# Patient Record
Sex: Female | Born: 1945
Health system: Southern US, Community
[De-identification: ages and names within clinical notes are randomized; demographics above are authoritative.]

## PROBLEM LIST (undated history)

## (undated) DIAGNOSIS — T8859XA Other complications of anesthesia, initial encounter: Secondary | ICD-10-CM

## (undated) DIAGNOSIS — M25551 Pain in right hip: Principal | ICD-10-CM

## (undated) DIAGNOSIS — T7840XA Allergy, unspecified, initial encounter: Secondary | ICD-10-CM

## (undated) DIAGNOSIS — M810 Age-related osteoporosis without current pathological fracture: Secondary | ICD-10-CM

## (undated) DIAGNOSIS — E785 Hyperlipidemia, unspecified: Secondary | ICD-10-CM

## (undated) DIAGNOSIS — R519 Headache, unspecified: Secondary | ICD-10-CM

## (undated) DIAGNOSIS — Z9889 Other specified postprocedural states: Secondary | ICD-10-CM

## (undated) DIAGNOSIS — T4145XA Adverse effect of unspecified anesthetic, initial encounter: Secondary | ICD-10-CM

## (undated) DIAGNOSIS — R109 Unspecified abdominal pain: Secondary | ICD-10-CM

## (undated) DIAGNOSIS — Z5189 Encounter for other specified aftercare: Secondary | ICD-10-CM

## (undated) DIAGNOSIS — N390 Urinary tract infection, site not specified: Secondary | ICD-10-CM

## (undated) DIAGNOSIS — R112 Nausea with vomiting, unspecified: Secondary | ICD-10-CM

## (undated) DIAGNOSIS — Z8619 Personal history of other infectious and parasitic diseases: Secondary | ICD-10-CM

## (undated) DIAGNOSIS — F329 Major depressive disorder, single episode, unspecified: Secondary | ICD-10-CM

## (undated) DIAGNOSIS — E039 Hypothyroidism, unspecified: Secondary | ICD-10-CM

## (undated) DIAGNOSIS — F32A Depression, unspecified: Secondary | ICD-10-CM

## (undated) DIAGNOSIS — M419 Scoliosis, unspecified: Secondary | ICD-10-CM

## (undated) DIAGNOSIS — E079 Disorder of thyroid, unspecified: Secondary | ICD-10-CM

## (undated) DIAGNOSIS — Z0000 Encounter for general adult medical examination without abnormal findings: Secondary | ICD-10-CM

## (undated) DIAGNOSIS — E876 Hypokalemia: Secondary | ICD-10-CM

## (undated) DIAGNOSIS — F418 Other specified anxiety disorders: Secondary | ICD-10-CM

## (undated) DIAGNOSIS — J069 Acute upper respiratory infection, unspecified: Secondary | ICD-10-CM

## (undated) DIAGNOSIS — D649 Anemia, unspecified: Secondary | ICD-10-CM

## (undated) DIAGNOSIS — G629 Polyneuropathy, unspecified: Secondary | ICD-10-CM

## (undated) DIAGNOSIS — S73004A Unspecified dislocation of right hip, initial encounter: Secondary | ICD-10-CM

## (undated) DIAGNOSIS — R51 Headache: Secondary | ICD-10-CM

## (undated) DIAGNOSIS — I8390 Asymptomatic varicose veins of unspecified lower extremity: Secondary | ICD-10-CM

## (undated) DIAGNOSIS — M48061 Spinal stenosis, lumbar region without neurogenic claudication: Secondary | ICD-10-CM

## (undated) DIAGNOSIS — M1990 Unspecified osteoarthritis, unspecified site: Secondary | ICD-10-CM

## (undated) DIAGNOSIS — H9191 Unspecified hearing loss, right ear: Secondary | ICD-10-CM

## (undated) HISTORY — DX: Encounter for general adult medical examination without abnormal findings: Z00.00

## (undated) HISTORY — DX: Headache: R51

## (undated) HISTORY — PX: JOINT REPLACEMENT: SHX530

## (undated) HISTORY — DX: Urinary tract infection, site not specified: N39.0

## (undated) HISTORY — DX: Personal history of other infectious and parasitic diseases: Z86.19

## (undated) HISTORY — DX: Allergy, unspecified, initial encounter: T78.40XA

## (undated) HISTORY — DX: Polyneuropathy, unspecified: G62.9

## (undated) HISTORY — DX: Acute upper respiratory infection, unspecified: J06.9

## (undated) HISTORY — DX: Depression, unspecified: F32.A

## (undated) HISTORY — PX: EYE SURGERY: SHX253

## (undated) HISTORY — DX: Unspecified abdominal pain: R10.9

## (undated) HISTORY — DX: Unspecified osteoarthritis, unspecified site: M19.90

## (undated) HISTORY — DX: Anemia, unspecified: D64.9

## (undated) HISTORY — DX: Scoliosis, unspecified: M41.9

## (undated) HISTORY — DX: Age-related osteoporosis without current pathological fracture: M81.0

## (undated) HISTORY — PX: BREAST EXCISIONAL BIOPSY: SUR124

## (undated) HISTORY — DX: Pain in right hip: M25.551

## (undated) HISTORY — DX: Major depressive disorder, single episode, unspecified: F32.9

## (undated) HISTORY — PX: BREAST SURGERY: SHX581

## (undated) HISTORY — DX: Spinal stenosis, lumbar region without neurogenic claudication: M48.061

## (undated) HISTORY — DX: Hyperlipidemia, unspecified: E78.5

## (undated) HISTORY — DX: Headache, unspecified: R51.9

## (undated) HISTORY — DX: Hypokalemia: E87.6

## (undated) HISTORY — PX: TONSILLECTOMY: SUR1361

## (undated) HISTORY — DX: Encounter for other specified aftercare: Z51.89

## (undated) HISTORY — DX: Other specified anxiety disorders: F41.8

## (undated) HISTORY — PX: REDUCTION MAMMAPLASTY: SUR839

## (undated) HISTORY — DX: Disorder of thyroid, unspecified: E07.9

## (undated) HISTORY — DX: Unspecified hearing loss, right ear: H91.91

---

## 1978-04-13 HISTORY — PX: TUBAL LIGATION: SHX77

## 1993-04-13 HISTORY — PX: METACARPOPHALANGEAL JOINT CAPSULECTOMY / CAPSULOTOMY: SUR186

## 1999-08-19 ENCOUNTER — Encounter: Payer: Self-pay | Admitting: Vascular Surgery

## 1999-08-21 ENCOUNTER — Ambulatory Visit (HOSPITAL_COMMUNITY): Admission: RE | Admit: 1999-08-21 | Discharge: 1999-08-21 | Payer: Self-pay | Admitting: Vascular Surgery

## 2000-01-15 ENCOUNTER — Other Ambulatory Visit: Admission: RE | Admit: 2000-01-15 | Discharge: 2000-01-15 | Payer: Self-pay | Admitting: Obstetrics and Gynecology

## 2000-02-24 ENCOUNTER — Encounter: Payer: Self-pay | Admitting: Obstetrics and Gynecology

## 2000-02-24 ENCOUNTER — Ambulatory Visit (HOSPITAL_COMMUNITY): Admission: RE | Admit: 2000-02-24 | Discharge: 2000-02-24 | Payer: Self-pay | Admitting: Obstetrics and Gynecology

## 2000-03-30 ENCOUNTER — Other Ambulatory Visit: Admission: RE | Admit: 2000-03-30 | Discharge: 2000-03-30 | Payer: Self-pay | Admitting: Obstetrics and Gynecology

## 2000-10-28 ENCOUNTER — Ambulatory Visit (HOSPITAL_COMMUNITY): Admission: RE | Admit: 2000-10-28 | Discharge: 2000-10-28 | Payer: Self-pay | Admitting: Gastroenterology

## 2001-05-17 ENCOUNTER — Encounter: Payer: Self-pay | Admitting: Internal Medicine

## 2001-05-17 ENCOUNTER — Ambulatory Visit (HOSPITAL_COMMUNITY): Admission: RE | Admit: 2001-05-17 | Discharge: 2001-05-17 | Payer: Self-pay | Admitting: Internal Medicine

## 2001-06-15 ENCOUNTER — Encounter: Payer: Self-pay | Admitting: Vascular Surgery

## 2001-06-16 ENCOUNTER — Ambulatory Visit (HOSPITAL_COMMUNITY): Admission: RE | Admit: 2001-06-16 | Discharge: 2001-06-16 | Payer: Self-pay | Admitting: Vascular Surgery

## 2009-04-13 HISTORY — PX: TOTAL HIP ARTHROPLASTY: SHX124

## 2009-06-12 LAB — HM COLONOSCOPY

## 2010-02-21 LAB — HM COLONOSCOPY

## 2011-06-02 DIAGNOSIS — R05 Cough: Secondary | ICD-10-CM | POA: Diagnosis not present

## 2011-06-02 DIAGNOSIS — J069 Acute upper respiratory infection, unspecified: Secondary | ICD-10-CM | POA: Diagnosis not present

## 2011-11-18 DIAGNOSIS — M545 Low back pain, unspecified: Secondary | ICD-10-CM | POA: Diagnosis not present

## 2011-11-18 DIAGNOSIS — R1011 Right upper quadrant pain: Secondary | ICD-10-CM | POA: Diagnosis not present

## 2011-11-26 DIAGNOSIS — R1011 Right upper quadrant pain: Secondary | ICD-10-CM | POA: Diagnosis not present

## 2011-12-17 DIAGNOSIS — G5600 Carpal tunnel syndrome, unspecified upper limb: Secondary | ICD-10-CM | POA: Diagnosis not present

## 2011-12-17 DIAGNOSIS — F4321 Adjustment disorder with depressed mood: Secondary | ICD-10-CM | POA: Diagnosis not present

## 2011-12-17 DIAGNOSIS — M545 Low back pain: Secondary | ICD-10-CM | POA: Diagnosis not present

## 2012-01-20 DIAGNOSIS — F329 Major depressive disorder, single episode, unspecified: Secondary | ICD-10-CM | POA: Diagnosis not present

## 2012-02-24 DIAGNOSIS — M545 Low back pain, unspecified: Secondary | ICD-10-CM | POA: Diagnosis not present

## 2012-02-24 DIAGNOSIS — E039 Hypothyroidism, unspecified: Secondary | ICD-10-CM | POA: Diagnosis not present

## 2012-02-24 DIAGNOSIS — Z131 Encounter for screening for diabetes mellitus: Secondary | ICD-10-CM | POA: Diagnosis not present

## 2012-03-01 DIAGNOSIS — M4120 Other idiopathic scoliosis, site unspecified: Secondary | ICD-10-CM | POA: Diagnosis not present

## 2012-03-01 DIAGNOSIS — IMO0002 Reserved for concepts with insufficient information to code with codable children: Secondary | ICD-10-CM | POA: Diagnosis not present

## 2012-03-01 DIAGNOSIS — M546 Pain in thoracic spine: Secondary | ICD-10-CM | POA: Diagnosis not present

## 2012-03-01 DIAGNOSIS — M5124 Other intervertebral disc displacement, thoracic region: Secondary | ICD-10-CM | POA: Diagnosis not present

## 2012-03-01 DIAGNOSIS — M48061 Spinal stenosis, lumbar region without neurogenic claudication: Secondary | ICD-10-CM | POA: Diagnosis not present

## 2012-03-01 DIAGNOSIS — M5137 Other intervertebral disc degeneration, lumbosacral region: Secondary | ICD-10-CM | POA: Diagnosis not present

## 2012-04-22 DIAGNOSIS — M5137 Other intervertebral disc degeneration, lumbosacral region: Secondary | ICD-10-CM | POA: Diagnosis not present

## 2012-04-22 DIAGNOSIS — M533 Sacrococcygeal disorders, not elsewhere classified: Secondary | ICD-10-CM | POA: Diagnosis not present

## 2012-05-19 DIAGNOSIS — M5137 Other intervertebral disc degeneration, lumbosacral region: Secondary | ICD-10-CM | POA: Diagnosis not present

## 2012-05-19 DIAGNOSIS — M533 Sacrococcygeal disorders, not elsewhere classified: Secondary | ICD-10-CM | POA: Diagnosis not present

## 2012-05-19 DIAGNOSIS — M1990 Unspecified osteoarthritis, unspecified site: Secondary | ICD-10-CM | POA: Diagnosis not present

## 2012-05-19 DIAGNOSIS — Z880 Allergy status to penicillin: Secondary | ICD-10-CM | POA: Diagnosis not present

## 2012-05-24 DIAGNOSIS — IMO0001 Reserved for inherently not codable concepts without codable children: Secondary | ICD-10-CM | POA: Diagnosis not present

## 2012-05-24 DIAGNOSIS — M533 Sacrococcygeal disorders, not elsewhere classified: Secondary | ICD-10-CM | POA: Diagnosis not present

## 2012-05-24 DIAGNOSIS — M5137 Other intervertebral disc degeneration, lumbosacral region: Secondary | ICD-10-CM | POA: Diagnosis not present

## 2012-05-31 DIAGNOSIS — M4800 Spinal stenosis, site unspecified: Secondary | ICD-10-CM | POA: Diagnosis not present

## 2012-05-31 DIAGNOSIS — R609 Edema, unspecified: Secondary | ICD-10-CM | POA: Diagnosis not present

## 2012-05-31 DIAGNOSIS — E039 Hypothyroidism, unspecified: Secondary | ICD-10-CM | POA: Diagnosis not present

## 2012-06-01 DIAGNOSIS — IMO0001 Reserved for inherently not codable concepts without codable children: Secondary | ICD-10-CM | POA: Diagnosis not present

## 2012-06-01 DIAGNOSIS — M533 Sacrococcygeal disorders, not elsewhere classified: Secondary | ICD-10-CM | POA: Diagnosis not present

## 2012-06-01 DIAGNOSIS — M5137 Other intervertebral disc degeneration, lumbosacral region: Secondary | ICD-10-CM | POA: Diagnosis not present

## 2012-06-03 DIAGNOSIS — IMO0001 Reserved for inherently not codable concepts without codable children: Secondary | ICD-10-CM | POA: Diagnosis not present

## 2012-06-03 DIAGNOSIS — M5137 Other intervertebral disc degeneration, lumbosacral region: Secondary | ICD-10-CM | POA: Diagnosis not present

## 2012-06-03 DIAGNOSIS — M533 Sacrococcygeal disorders, not elsewhere classified: Secondary | ICD-10-CM | POA: Diagnosis not present

## 2012-06-08 DIAGNOSIS — M5137 Other intervertebral disc degeneration, lumbosacral region: Secondary | ICD-10-CM | POA: Diagnosis not present

## 2012-06-08 DIAGNOSIS — IMO0001 Reserved for inherently not codable concepts without codable children: Secondary | ICD-10-CM | POA: Diagnosis not present

## 2012-06-08 DIAGNOSIS — M533 Sacrococcygeal disorders, not elsewhere classified: Secondary | ICD-10-CM | POA: Diagnosis not present

## 2012-06-10 DIAGNOSIS — M533 Sacrococcygeal disorders, not elsewhere classified: Secondary | ICD-10-CM | POA: Diagnosis not present

## 2012-06-10 DIAGNOSIS — IMO0001 Reserved for inherently not codable concepts without codable children: Secondary | ICD-10-CM | POA: Diagnosis not present

## 2012-06-10 DIAGNOSIS — M5137 Other intervertebral disc degeneration, lumbosacral region: Secondary | ICD-10-CM | POA: Diagnosis not present

## 2012-06-17 DIAGNOSIS — M5137 Other intervertebral disc degeneration, lumbosacral region: Secondary | ICD-10-CM | POA: Diagnosis not present

## 2012-06-17 DIAGNOSIS — IMO0001 Reserved for inherently not codable concepts without codable children: Secondary | ICD-10-CM | POA: Diagnosis not present

## 2012-06-17 DIAGNOSIS — M533 Sacrococcygeal disorders, not elsewhere classified: Secondary | ICD-10-CM | POA: Diagnosis not present

## 2012-06-22 DIAGNOSIS — IMO0001 Reserved for inherently not codable concepts without codable children: Secondary | ICD-10-CM | POA: Diagnosis not present

## 2012-06-22 DIAGNOSIS — M5137 Other intervertebral disc degeneration, lumbosacral region: Secondary | ICD-10-CM | POA: Diagnosis not present

## 2012-06-22 DIAGNOSIS — M533 Sacrococcygeal disorders, not elsewhere classified: Secondary | ICD-10-CM | POA: Diagnosis not present

## 2012-06-24 DIAGNOSIS — M5137 Other intervertebral disc degeneration, lumbosacral region: Secondary | ICD-10-CM | POA: Diagnosis not present

## 2012-06-24 DIAGNOSIS — IMO0001 Reserved for inherently not codable concepts without codable children: Secondary | ICD-10-CM | POA: Diagnosis not present

## 2012-06-24 DIAGNOSIS — M533 Sacrococcygeal disorders, not elsewhere classified: Secondary | ICD-10-CM | POA: Diagnosis not present

## 2012-06-28 DIAGNOSIS — M533 Sacrococcygeal disorders, not elsewhere classified: Secondary | ICD-10-CM | POA: Diagnosis not present

## 2012-06-28 DIAGNOSIS — M5137 Other intervertebral disc degeneration, lumbosacral region: Secondary | ICD-10-CM | POA: Diagnosis not present

## 2012-06-28 DIAGNOSIS — IMO0001 Reserved for inherently not codable concepts without codable children: Secondary | ICD-10-CM | POA: Diagnosis not present

## 2012-06-30 DIAGNOSIS — M533 Sacrococcygeal disorders, not elsewhere classified: Secondary | ICD-10-CM | POA: Diagnosis not present

## 2012-06-30 DIAGNOSIS — IMO0001 Reserved for inherently not codable concepts without codable children: Secondary | ICD-10-CM | POA: Diagnosis not present

## 2012-06-30 DIAGNOSIS — M5137 Other intervertebral disc degeneration, lumbosacral region: Secondary | ICD-10-CM | POA: Diagnosis not present

## 2012-07-05 DIAGNOSIS — M533 Sacrococcygeal disorders, not elsewhere classified: Secondary | ICD-10-CM | POA: Diagnosis not present

## 2012-08-19 DIAGNOSIS — M533 Sacrococcygeal disorders, not elsewhere classified: Secondary | ICD-10-CM | POA: Diagnosis not present

## 2012-08-19 DIAGNOSIS — M5137 Other intervertebral disc degeneration, lumbosacral region: Secondary | ICD-10-CM | POA: Diagnosis not present

## 2012-09-08 DIAGNOSIS — M5137 Other intervertebral disc degeneration, lumbosacral region: Secondary | ICD-10-CM | POA: Diagnosis not present

## 2012-09-08 DIAGNOSIS — M461 Sacroiliitis, not elsewhere classified: Secondary | ICD-10-CM | POA: Diagnosis not present

## 2012-09-08 DIAGNOSIS — M533 Sacrococcygeal disorders, not elsewhere classified: Secondary | ICD-10-CM | POA: Diagnosis not present

## 2012-10-05 DIAGNOSIS — M47817 Spondylosis without myelopathy or radiculopathy, lumbosacral region: Secondary | ICD-10-CM | POA: Diagnosis not present

## 2012-10-05 DIAGNOSIS — M5137 Other intervertebral disc degeneration, lumbosacral region: Secondary | ICD-10-CM | POA: Diagnosis not present

## 2012-10-05 DIAGNOSIS — M533 Sacrococcygeal disorders, not elsewhere classified: Secondary | ICD-10-CM | POA: Diagnosis not present

## 2012-11-03 DIAGNOSIS — M47817 Spondylosis without myelopathy or radiculopathy, lumbosacral region: Secondary | ICD-10-CM | POA: Diagnosis not present

## 2012-11-11 DIAGNOSIS — R634 Abnormal weight loss: Secondary | ICD-10-CM | POA: Diagnosis not present

## 2012-11-11 DIAGNOSIS — E039 Hypothyroidism, unspecified: Secondary | ICD-10-CM | POA: Diagnosis not present

## 2012-11-22 DIAGNOSIS — Z1231 Encounter for screening mammogram for malignant neoplasm of breast: Secondary | ICD-10-CM | POA: Diagnosis not present

## 2012-11-22 DIAGNOSIS — Z9889 Other specified postprocedural states: Secondary | ICD-10-CM | POA: Diagnosis not present

## 2012-11-24 DIAGNOSIS — Z880 Allergy status to penicillin: Secondary | ICD-10-CM | POA: Diagnosis not present

## 2012-11-24 DIAGNOSIS — M47817 Spondylosis without myelopathy or radiculopathy, lumbosacral region: Secondary | ICD-10-CM | POA: Diagnosis not present

## 2012-11-24 DIAGNOSIS — Z96649 Presence of unspecified artificial hip joint: Secondary | ICD-10-CM | POA: Diagnosis not present

## 2012-11-24 DIAGNOSIS — E079 Disorder of thyroid, unspecified: Secondary | ICD-10-CM | POA: Diagnosis not present

## 2012-11-24 DIAGNOSIS — M1990 Unspecified osteoarthritis, unspecified site: Secondary | ICD-10-CM | POA: Diagnosis not present

## 2012-11-24 DIAGNOSIS — M810 Age-related osteoporosis without current pathological fracture: Secondary | ICD-10-CM | POA: Diagnosis not present

## 2012-11-24 DIAGNOSIS — Z882 Allergy status to sulfonamides status: Secondary | ICD-10-CM | POA: Diagnosis not present

## 2012-12-20 DIAGNOSIS — Z79899 Other long term (current) drug therapy: Secondary | ICD-10-CM | POA: Diagnosis not present

## 2012-12-20 DIAGNOSIS — Z880 Allergy status to penicillin: Secondary | ICD-10-CM | POA: Diagnosis not present

## 2012-12-20 DIAGNOSIS — IMO0002 Reserved for concepts with insufficient information to code with codable children: Secondary | ICD-10-CM | POA: Diagnosis not present

## 2012-12-20 DIAGNOSIS — M1990 Unspecified osteoarthritis, unspecified site: Secondary | ICD-10-CM | POA: Diagnosis not present

## 2012-12-20 DIAGNOSIS — M4120 Other idiopathic scoliosis, site unspecified: Secondary | ICD-10-CM | POA: Diagnosis not present

## 2012-12-20 DIAGNOSIS — M5137 Other intervertebral disc degeneration, lumbosacral region: Secondary | ICD-10-CM | POA: Diagnosis not present

## 2012-12-20 DIAGNOSIS — M4310 Spondylolisthesis, site unspecified: Secondary | ICD-10-CM | POA: Diagnosis not present

## 2012-12-20 DIAGNOSIS — Z882 Allergy status to sulfonamides status: Secondary | ICD-10-CM | POA: Diagnosis not present

## 2012-12-20 DIAGNOSIS — M810 Age-related osteoporosis without current pathological fracture: Secondary | ICD-10-CM | POA: Diagnosis not present

## 2012-12-20 DIAGNOSIS — E079 Disorder of thyroid, unspecified: Secondary | ICD-10-CM | POA: Diagnosis not present

## 2012-12-20 DIAGNOSIS — M51379 Other intervertebral disc degeneration, lumbosacral region without mention of lumbar back pain or lower extremity pain: Secondary | ICD-10-CM | POA: Diagnosis not present

## 2012-12-20 DIAGNOSIS — M899 Disorder of bone, unspecified: Secondary | ICD-10-CM | POA: Diagnosis not present

## 2013-01-10 DIAGNOSIS — E039 Hypothyroidism, unspecified: Secondary | ICD-10-CM | POA: Diagnosis not present

## 2013-01-11 DIAGNOSIS — M5137 Other intervertebral disc degeneration, lumbosacral region: Secondary | ICD-10-CM | POA: Diagnosis not present

## 2013-01-11 DIAGNOSIS — M47817 Spondylosis without myelopathy or radiculopathy, lumbosacral region: Secondary | ICD-10-CM | POA: Diagnosis not present

## 2013-03-21 DIAGNOSIS — J029 Acute pharyngitis, unspecified: Secondary | ICD-10-CM | POA: Diagnosis not present

## 2013-06-19 DIAGNOSIS — M5137 Other intervertebral disc degeneration, lumbosacral region: Secondary | ICD-10-CM | POA: Diagnosis not present

## 2013-06-19 DIAGNOSIS — M47817 Spondylosis without myelopathy or radiculopathy, lumbosacral region: Secondary | ICD-10-CM | POA: Diagnosis not present

## 2013-10-24 DIAGNOSIS — Z0000 Encounter for general adult medical examination without abnormal findings: Secondary | ICD-10-CM | POA: Diagnosis not present

## 2013-10-24 DIAGNOSIS — E039 Hypothyroidism, unspecified: Secondary | ICD-10-CM | POA: Diagnosis not present

## 2014-04-12 DIAGNOSIS — G5601 Carpal tunnel syndrome, right upper limb: Secondary | ICD-10-CM | POA: Diagnosis not present

## 2014-04-12 DIAGNOSIS — I5030 Unspecified diastolic (congestive) heart failure: Secondary | ICD-10-CM | POA: Diagnosis not present

## 2014-04-12 DIAGNOSIS — E039 Hypothyroidism, unspecified: Secondary | ICD-10-CM | POA: Diagnosis not present

## 2014-04-12 DIAGNOSIS — M545 Low back pain: Secondary | ICD-10-CM | POA: Diagnosis not present

## 2014-04-26 DIAGNOSIS — M79643 Pain in unspecified hand: Secondary | ICD-10-CM | POA: Diagnosis not present

## 2014-04-26 DIAGNOSIS — R200 Anesthesia of skin: Secondary | ICD-10-CM | POA: Diagnosis not present

## 2014-04-26 DIAGNOSIS — M545 Low back pain: Secondary | ICD-10-CM | POA: Diagnosis not present

## 2014-04-26 DIAGNOSIS — M1990 Unspecified osteoarthritis, unspecified site: Secondary | ICD-10-CM | POA: Diagnosis not present

## 2014-05-08 DIAGNOSIS — M549 Dorsalgia, unspecified: Secondary | ICD-10-CM | POA: Diagnosis not present

## 2014-05-08 DIAGNOSIS — Z79891 Long term (current) use of opiate analgesic: Secondary | ICD-10-CM | POA: Diagnosis not present

## 2014-05-08 DIAGNOSIS — M488X6 Other specified spondylopathies, lumbar region: Secondary | ICD-10-CM | POA: Diagnosis not present

## 2014-05-08 DIAGNOSIS — Z79899 Other long term (current) drug therapy: Secondary | ICD-10-CM | POA: Diagnosis not present

## 2014-05-08 DIAGNOSIS — G894 Chronic pain syndrome: Secondary | ICD-10-CM | POA: Diagnosis not present

## 2014-05-18 DIAGNOSIS — I5030 Unspecified diastolic (congestive) heart failure: Secondary | ICD-10-CM | POA: Diagnosis not present

## 2014-05-18 DIAGNOSIS — E039 Hypothyroidism, unspecified: Secondary | ICD-10-CM | POA: Diagnosis not present

## 2014-05-18 DIAGNOSIS — Z0000 Encounter for general adult medical examination without abnormal findings: Secondary | ICD-10-CM | POA: Diagnosis not present

## 2014-05-18 DIAGNOSIS — M810 Age-related osteoporosis without current pathological fracture: Secondary | ICD-10-CM | POA: Diagnosis not present

## 2014-05-22 DIAGNOSIS — M549 Dorsalgia, unspecified: Secondary | ICD-10-CM | POA: Diagnosis not present

## 2014-05-22 DIAGNOSIS — M488X6 Other specified spondylopathies, lumbar region: Secondary | ICD-10-CM | POA: Diagnosis not present

## 2014-05-24 DIAGNOSIS — M810 Age-related osteoporosis without current pathological fracture: Secondary | ICD-10-CM | POA: Diagnosis not present

## 2014-05-24 DIAGNOSIS — I5030 Unspecified diastolic (congestive) heart failure: Secondary | ICD-10-CM | POA: Diagnosis not present

## 2014-05-24 DIAGNOSIS — E039 Hypothyroidism, unspecified: Secondary | ICD-10-CM | POA: Diagnosis not present

## 2014-05-24 DIAGNOSIS — E785 Hyperlipidemia, unspecified: Secondary | ICD-10-CM | POA: Diagnosis not present

## 2014-05-30 DIAGNOSIS — M545 Low back pain: Secondary | ICD-10-CM | POA: Diagnosis not present

## 2014-05-30 DIAGNOSIS — M79643 Pain in unspecified hand: Secondary | ICD-10-CM | POA: Diagnosis not present

## 2014-05-30 DIAGNOSIS — M1990 Unspecified osteoarthritis, unspecified site: Secondary | ICD-10-CM | POA: Diagnosis not present

## 2014-05-30 DIAGNOSIS — R200 Anesthesia of skin: Secondary | ICD-10-CM | POA: Diagnosis not present

## 2014-05-31 DIAGNOSIS — M545 Low back pain: Secondary | ICD-10-CM | POA: Diagnosis not present

## 2014-06-05 DIAGNOSIS — Z79899 Other long term (current) drug therapy: Secondary | ICD-10-CM | POA: Diagnosis not present

## 2014-06-05 DIAGNOSIS — G894 Chronic pain syndrome: Secondary | ICD-10-CM | POA: Diagnosis not present

## 2014-06-05 DIAGNOSIS — M488X6 Other specified spondylopathies, lumbar region: Secondary | ICD-10-CM | POA: Diagnosis not present

## 2014-06-05 DIAGNOSIS — M549 Dorsalgia, unspecified: Secondary | ICD-10-CM | POA: Diagnosis not present

## 2014-07-31 DIAGNOSIS — M488X6 Other specified spondylopathies, lumbar region: Secondary | ICD-10-CM | POA: Diagnosis not present

## 2014-07-31 DIAGNOSIS — M549 Dorsalgia, unspecified: Secondary | ICD-10-CM | POA: Diagnosis not present

## 2014-07-31 DIAGNOSIS — Z79899 Other long term (current) drug therapy: Secondary | ICD-10-CM | POA: Diagnosis not present

## 2014-07-31 DIAGNOSIS — G894 Chronic pain syndrome: Secondary | ICD-10-CM | POA: Diagnosis not present

## 2014-08-07 DIAGNOSIS — L570 Actinic keratosis: Secondary | ICD-10-CM | POA: Diagnosis not present

## 2014-08-07 DIAGNOSIS — L814 Other melanin hyperpigmentation: Secondary | ICD-10-CM | POA: Diagnosis not present

## 2014-08-07 DIAGNOSIS — D239 Other benign neoplasm of skin, unspecified: Secondary | ICD-10-CM | POA: Diagnosis not present

## 2014-08-15 DIAGNOSIS — M488X6 Other specified spondylopathies, lumbar region: Secondary | ICD-10-CM | POA: Diagnosis not present

## 2014-08-15 DIAGNOSIS — M5137 Other intervertebral disc degeneration, lumbosacral region: Secondary | ICD-10-CM | POA: Diagnosis not present

## 2014-08-15 DIAGNOSIS — M47817 Spondylosis without myelopathy or radiculopathy, lumbosacral region: Secondary | ICD-10-CM | POA: Diagnosis not present

## 2014-08-15 DIAGNOSIS — M549 Dorsalgia, unspecified: Secondary | ICD-10-CM | POA: Diagnosis not present

## 2014-08-28 DIAGNOSIS — G894 Chronic pain syndrome: Secondary | ICD-10-CM | POA: Diagnosis not present

## 2014-08-28 DIAGNOSIS — M488X6 Other specified spondylopathies, lumbar region: Secondary | ICD-10-CM | POA: Diagnosis not present

## 2014-08-28 DIAGNOSIS — M5137 Other intervertebral disc degeneration, lumbosacral region: Secondary | ICD-10-CM | POA: Diagnosis not present

## 2014-08-28 DIAGNOSIS — Z79899 Other long term (current) drug therapy: Secondary | ICD-10-CM | POA: Diagnosis not present

## 2014-08-28 DIAGNOSIS — M47817 Spondylosis without myelopathy or radiculopathy, lumbosacral region: Secondary | ICD-10-CM | POA: Diagnosis not present

## 2014-09-05 DIAGNOSIS — I8311 Varicose veins of right lower extremity with inflammation: Secondary | ICD-10-CM | POA: Diagnosis not present

## 2014-09-05 DIAGNOSIS — I83893 Varicose veins of bilateral lower extremities with other complications: Secondary | ICD-10-CM | POA: Diagnosis not present

## 2014-09-05 DIAGNOSIS — I83813 Varicose veins of bilateral lower extremities with pain: Secondary | ICD-10-CM | POA: Diagnosis not present

## 2014-09-13 DIAGNOSIS — I83813 Varicose veins of bilateral lower extremities with pain: Secondary | ICD-10-CM | POA: Diagnosis not present

## 2014-09-18 DIAGNOSIS — M488X6 Other specified spondylopathies, lumbar region: Secondary | ICD-10-CM | POA: Diagnosis not present

## 2014-09-18 DIAGNOSIS — M47817 Spondylosis without myelopathy or radiculopathy, lumbosacral region: Secondary | ICD-10-CM | POA: Diagnosis not present

## 2014-09-18 DIAGNOSIS — M549 Dorsalgia, unspecified: Secondary | ICD-10-CM | POA: Diagnosis not present

## 2014-09-18 DIAGNOSIS — M5137 Other intervertebral disc degeneration, lumbosacral region: Secondary | ICD-10-CM | POA: Diagnosis not present

## 2014-09-27 DIAGNOSIS — I83813 Varicose veins of bilateral lower extremities with pain: Secondary | ICD-10-CM | POA: Diagnosis not present

## 2014-10-05 DIAGNOSIS — I83891 Varicose veins of right lower extremities with other complications: Secondary | ICD-10-CM | POA: Diagnosis not present

## 2014-10-05 DIAGNOSIS — I83811 Varicose veins of right lower extremities with pain: Secondary | ICD-10-CM | POA: Diagnosis not present

## 2014-10-17 DIAGNOSIS — M47817 Spondylosis without myelopathy or radiculopathy, lumbosacral region: Secondary | ICD-10-CM | POA: Diagnosis not present

## 2014-10-17 DIAGNOSIS — M488X6 Other specified spondylopathies, lumbar region: Secondary | ICD-10-CM | POA: Diagnosis not present

## 2014-10-17 DIAGNOSIS — M549 Dorsalgia, unspecified: Secondary | ICD-10-CM | POA: Diagnosis not present

## 2014-10-17 DIAGNOSIS — M5137 Other intervertebral disc degeneration, lumbosacral region: Secondary | ICD-10-CM | POA: Diagnosis not present

## 2014-10-18 DIAGNOSIS — I83811 Varicose veins of right lower extremities with pain: Secondary | ICD-10-CM | POA: Diagnosis not present

## 2014-10-18 DIAGNOSIS — M7981 Nontraumatic hematoma of soft tissue: Secondary | ICD-10-CM | POA: Diagnosis not present

## 2014-11-05 DIAGNOSIS — M7981 Nontraumatic hematoma of soft tissue: Secondary | ICD-10-CM | POA: Diagnosis not present

## 2014-11-05 DIAGNOSIS — I83811 Varicose veins of right lower extremities with pain: Secondary | ICD-10-CM | POA: Diagnosis not present

## 2014-11-05 DIAGNOSIS — I8311 Varicose veins of right lower extremity with inflammation: Secondary | ICD-10-CM | POA: Diagnosis not present

## 2014-11-14 DIAGNOSIS — M488X6 Other specified spondylopathies, lumbar region: Secondary | ICD-10-CM | POA: Diagnosis not present

## 2014-11-14 DIAGNOSIS — M47817 Spondylosis without myelopathy or radiculopathy, lumbosacral region: Secondary | ICD-10-CM | POA: Diagnosis not present

## 2014-11-14 DIAGNOSIS — M5137 Other intervertebral disc degeneration, lumbosacral region: Secondary | ICD-10-CM | POA: Diagnosis not present

## 2014-12-06 DIAGNOSIS — M7981 Nontraumatic hematoma of soft tissue: Secondary | ICD-10-CM | POA: Diagnosis not present

## 2014-12-08 DIAGNOSIS — J019 Acute sinusitis, unspecified: Secondary | ICD-10-CM | POA: Diagnosis not present

## 2014-12-08 DIAGNOSIS — N390 Urinary tract infection, site not specified: Secondary | ICD-10-CM | POA: Diagnosis not present

## 2014-12-12 DIAGNOSIS — M5137 Other intervertebral disc degeneration, lumbosacral region: Secondary | ICD-10-CM | POA: Diagnosis not present

## 2014-12-12 DIAGNOSIS — G894 Chronic pain syndrome: Secondary | ICD-10-CM | POA: Diagnosis not present

## 2014-12-12 DIAGNOSIS — M47817 Spondylosis without myelopathy or radiculopathy, lumbosacral region: Secondary | ICD-10-CM | POA: Diagnosis not present

## 2014-12-12 DIAGNOSIS — Z79899 Other long term (current) drug therapy: Secondary | ICD-10-CM | POA: Diagnosis not present

## 2014-12-12 DIAGNOSIS — M488X6 Other specified spondylopathies, lumbar region: Secondary | ICD-10-CM | POA: Diagnosis not present

## 2015-01-14 DIAGNOSIS — M7981 Nontraumatic hematoma of soft tissue: Secondary | ICD-10-CM | POA: Diagnosis not present

## 2015-01-14 DIAGNOSIS — I83811 Varicose veins of right lower extremities with pain: Secondary | ICD-10-CM | POA: Diagnosis not present

## 2015-01-14 DIAGNOSIS — I8311 Varicose veins of right lower extremity with inflammation: Secondary | ICD-10-CM | POA: Diagnosis not present

## 2015-01-22 ENCOUNTER — Encounter: Payer: Self-pay | Admitting: Family Medicine

## 2015-01-22 ENCOUNTER — Ambulatory Visit (INDEPENDENT_AMBULATORY_CARE_PROVIDER_SITE_OTHER): Payer: Medicare Other | Admitting: Family Medicine

## 2015-01-22 ENCOUNTER — Ambulatory Visit (HOSPITAL_BASED_OUTPATIENT_CLINIC_OR_DEPARTMENT_OTHER)
Admission: RE | Admit: 2015-01-22 | Discharge: 2015-01-22 | Disposition: A | Discharge status: Home / Self Care | Payer: Medicare Other | Source: Ambulatory Visit | Attending: Family Medicine | Admitting: Family Medicine

## 2015-01-22 VITALS — BP 135/71 | HR 83 | Temp 98.3°F | Ht 66.0 in | Wt 104.0 lb

## 2015-01-22 DIAGNOSIS — M810 Age-related osteoporosis without current pathological fracture: Secondary | ICD-10-CM

## 2015-01-22 DIAGNOSIS — M25551 Pain in right hip: Secondary | ICD-10-CM | POA: Diagnosis not present

## 2015-01-22 DIAGNOSIS — R109 Unspecified abdominal pain: Secondary | ICD-10-CM | POA: Diagnosis present

## 2015-01-22 DIAGNOSIS — R5382 Chronic fatigue, unspecified: Secondary | ICD-10-CM

## 2015-01-22 DIAGNOSIS — E039 Hypothyroidism, unspecified: Secondary | ICD-10-CM

## 2015-01-22 DIAGNOSIS — R519 Headache, unspecified: Secondary | ICD-10-CM

## 2015-01-22 DIAGNOSIS — Z8619 Personal history of other infectious and parasitic diseases: Secondary | ICD-10-CM | POA: Insufficient documentation

## 2015-01-22 DIAGNOSIS — J329 Chronic sinusitis, unspecified: Secondary | ICD-10-CM

## 2015-01-22 DIAGNOSIS — E079 Disorder of thyroid, unspecified: Secondary | ICD-10-CM

## 2015-01-22 DIAGNOSIS — M419 Scoliosis, unspecified: Secondary | ICD-10-CM

## 2015-01-22 DIAGNOSIS — I951 Orthostatic hypotension: Secondary | ICD-10-CM

## 2015-01-22 DIAGNOSIS — F329 Major depressive disorder, single episode, unspecified: Secondary | ICD-10-CM

## 2015-01-22 DIAGNOSIS — F32A Depression, unspecified: Secondary | ICD-10-CM

## 2015-01-22 DIAGNOSIS — R51 Headache: Secondary | ICD-10-CM

## 2015-01-22 DIAGNOSIS — E785 Hyperlipidemia, unspecified: Secondary | ICD-10-CM

## 2015-01-22 DIAGNOSIS — G6289 Other specified polyneuropathies: Secondary | ICD-10-CM

## 2015-01-22 DIAGNOSIS — R252 Cramp and spasm: Secondary | ICD-10-CM

## 2015-01-22 DIAGNOSIS — M1990 Unspecified osteoarthritis, unspecified site: Secondary | ICD-10-CM

## 2015-01-22 DIAGNOSIS — R1906 Epigastric swelling, mass or lump: Secondary | ICD-10-CM | POA: Insufficient documentation

## 2015-01-22 DIAGNOSIS — I77819 Aortic ectasia, unspecified site: Secondary | ICD-10-CM

## 2015-01-22 MED ORDER — CEFDINIR 300 MG PO CAPS: 300.0000 mg | ORAL_CAPSULE | Freq: Two times a day (BID) | ORAL | Status: AC

## 2015-01-22 NOTE — Patient Instructions (Signed)
Mucinex twice a day x 10 days. 64 oz of clear fluids, probiotics, nasal saline twice daily and Zyrtec 10 mg daily   Sinusitis, Adult Sinusitis is redness, soreness, and inflammation of the paranasal sinuses. Paranasal sinuses are air pockets within the bones of your face. They are located beneath your eyes, in the middle of your forehead, and above your eyes. In healthy paranasal sinuses, mucus is able to drain out, and air is able to circulate through them by way of your nose. However, when your paranasal sinuses are inflamed, mucus and air can become trapped. This can allow bacteria and other germs to grow and cause infection. Sinusitis can develop quickly and last only a short time (acute) or continue over a long period (chronic). Sinusitis that lasts for more than 12 weeks is considered chronic. CAUSES Causes of sinusitis include:  Allergies.  Structural abnormalities, such as displacement of the cartilage that separates your nostrils (deviated septum), which can decrease the air flow through your nose and sinuses and affect sinus drainage.  Functional abnormalities, such as when the small hairs (cilia) that line your sinuses and help remove mucus do not work properly or are not present. SIGNS AND SYMPTOMS Symptoms of acute and chronic sinusitis are the same. The primary symptoms are pain and pressure around the affected sinuses. Other symptoms include:  Upper toothache.  Earache.  Headache.  Bad breath.  Decreased sense of smell and taste.  A cough, which worsens when you are lying flat.  Fatigue.  Fever.  Thick drainage from your nose, which often is green and may contain pus (purulent).  Swelling and warmth over the affected sinuses. DIAGNOSIS Your health care provider will perform a physical exam. During your exam, your health care provider may perform any of the following to help determine if you have acute sinusitis or chronic sinusitis:  Look in your nose for signs  of abnormal growths in your nostrils (nasal polyps).  Tap over the affected sinus to check for signs of infection.  View the inside of your sinuses using an imaging device that has a light attached (endoscope). If your health care provider suspects that you have chronic sinusitis, one or more of the following tests may be recommended:  Allergy tests.  Nasal culture. A sample of mucus is taken from your nose, sent to a lab, and screened for bacteria.  Nasal cytology. A sample of mucus is taken from your nose and examined by your health care provider to determine if your sinusitis is related to an allergy. TREATMENT Most cases of acute sinusitis are related to a viral infection and will resolve on their own within 10 days. Sometimes, medicines are prescribed to help relieve symptoms of both acute and chronic sinusitis. These may include pain medicines, decongestants, nasal steroid sprays, or saline sprays. However, for sinusitis related to a bacterial infection, your health care provider will prescribe antibiotic medicines. These are medicines that will help kill the bacteria causing the infection. Rarely, sinusitis is caused by a fungal infection. In these cases, your health care provider will prescribe antifungal medicine. For some cases of chronic sinusitis, surgery is needed. Generally, these are cases in which sinusitis recurs more than 3 times per year, despite other treatments. HOME CARE INSTRUCTIONS  Drink plenty of water. Water helps thin the mucus so your sinuses can drain more easily.  Use a humidifier.  Inhale steam 3-4 times a day (for example, sit in the bathroom with the shower running).  Apply a warm,  moist washcloth to your face 3-4 times a day, or as directed by your health care provider.  Use saline nasal sprays to help moisten and clean your sinuses.  Take medicines only as directed by your health care provider.  If you were prescribed either an antibiotic or  antifungal medicine, finish it all even if you start to feel better. SEEK IMMEDIATE MEDICAL CARE IF:  You have increasing pain or severe headaches.  You have nausea, vomiting, or drowsiness.  You have swelling around your face.  You have vision problems.  You have a stiff neck.  You have difficulty breathing.   This information is not intended to replace advice given to you by your health care provider. Make sure you discuss any questions you have with your health care provider.   Document Released: 03/30/2005 Document Revised: 04/20/2014 Document Reviewed: 04/14/2011 Elsevier Interactive Patient Education [employer].

## 2015-01-22 NOTE — Progress Notes (Signed)
Pre visit review using our clinic review tool, if applicable. No additional management support is needed unless otherwise documented below in the visit note. 

## 2015-01-23 ENCOUNTER — Other Ambulatory Visit: Payer: Self-pay | Admitting: Family Medicine

## 2015-01-23 DIAGNOSIS — M25551 Pain in right hip: Secondary | ICD-10-CM

## 2015-01-23 LAB — COMPREHENSIVE METABOLIC PANEL
ALK PHOS: 54 U/L (ref 39–117)
ALT: 19 U/L (ref 0–35)
AST: 27 U/L (ref 0–37)
Albumin: 4.3 g/dL (ref 3.5–5.2)
BUN: 16 mg/dL (ref 6–23)
CALCIUM: 10.3 mg/dL (ref 8.4–10.5)
CO2: 33 meq/L — AB (ref 19–32)
Chloride: 100 mEq/L (ref 96–112)
Creatinine, Ser: 0.83 mg/dL (ref 0.40–1.20)
GFR: 72.08 mL/min (ref >60.00)
GLUCOSE: 85 mg/dL (ref 70–99)
POTASSIUM: 4 meq/L (ref 3.5–5.1)
Sodium: 140 mEq/L (ref 135–145)
TOTAL PROTEIN: 6.9 g/dL (ref 6.0–8.3)
Total Bilirubin: 0.4 mg/dL (ref 0.2–1.2)

## 2015-01-23 LAB — VITAMIN D 25 HYDROXY (VIT D DEFICIENCY, FRACTURES): VITD: 79.67 ng/mL (ref 30.00–100.00)

## 2015-01-23 LAB — LIPID PANEL
CHOL/HDL RATIO: 3
Cholesterol: 230 mg/dL — ABNORMAL HIGH (ref 0–200)
HDL: 71.6 mg/dL (ref >39.00)
LDL Cholesterol: 144 mg/dL — ABNORMAL HIGH (ref 0–99)
NONHDL: 158.41
Triglycerides: 73 mg/dL (ref 0.0–149.0)
VLDL: 14.6 mg/dL (ref 0.0–40.0)

## 2015-01-23 LAB — TSH: TSH: 0.03 u[IU]/mL — ABNORMAL LOW (ref 0.35–4.50)

## 2015-01-23 LAB — T3, FREE: T3 FREE: 4.6 pg/mL — AB (ref 2.3–4.2)

## 2015-01-23 LAB — CBC
HEMATOCRIT: 42.4 % (ref 36.0–46.0)
HEMOGLOBIN: 13.9 g/dL (ref 12.0–15.0)
MCHC: 32.8 g/dL (ref 30.0–36.0)
MCV: 85.2 fl (ref 78.0–100.0)
PLATELETS: 257 10*3/uL (ref 150.0–400.0)
RBC: 4.97 Mil/uL (ref 3.87–5.11)
RDW: 13.7 % (ref 11.5–15.5)
WBC: 9.2 10*3/uL (ref 4.0–10.5)

## 2015-01-23 LAB — T4, FREE: FREE T4: 1.09 ng/dL (ref 0.60–1.60)

## 2015-01-23 LAB — MAGNESIUM: Magnesium: 2.2 mg/dL (ref 1.5–2.5)

## 2015-01-23 MED ORDER — LEVOTHYROXINE SODIUM 50 MCG PO TABS
50.0000 ug | ORAL_TABLET | Freq: Every day | ORAL | Status: DC
Start: 2015-01-23 — End: 2015-03-05

## 2015-01-26 ENCOUNTER — Ambulatory Visit (HOSPITAL_BASED_OUTPATIENT_CLINIC_OR_DEPARTMENT_OTHER)
Admission: RE | Admit: 2015-01-26 | Discharge: 2015-01-26 | Disposition: A | Discharge status: Home / Self Care | Payer: Medicare Other | Source: Ambulatory Visit | Attending: Family Medicine | Admitting: Family Medicine

## 2015-01-26 DIAGNOSIS — X58XXXA Exposure to other specified factors, initial encounter: Secondary | ICD-10-CM | POA: Diagnosis not present

## 2015-01-26 DIAGNOSIS — Z96642 Presence of left artificial hip joint: Secondary | ICD-10-CM | POA: Diagnosis not present

## 2015-01-26 DIAGNOSIS — S73191A Other sprain of right hip, initial encounter: Secondary | ICD-10-CM | POA: Diagnosis not present

## 2015-01-26 DIAGNOSIS — M1611 Unilateral primary osteoarthritis, right hip: Secondary | ICD-10-CM | POA: Insufficient documentation

## 2015-01-26 DIAGNOSIS — M25551 Pain in right hip: Secondary | ICD-10-CM | POA: Diagnosis present

## 2015-01-28 ENCOUNTER — Other Ambulatory Visit: Payer: Self-pay | Admitting: Family Medicine

## 2015-01-28 ENCOUNTER — Telehealth: Payer: Self-pay | Admitting: Family Medicine

## 2015-01-28 DIAGNOSIS — M25551 Pain in right hip: Secondary | ICD-10-CM

## 2015-01-28 NOTE — Telephone Encounter (Signed)
The patient is currently on an antibiotic and is has a dental appt. On Thursday and normally takes 4 the day of a dental appt. ,but since currently on an antibiotic for something else should she add the extra 4.

## 2015-01-28 NOTE — Telephone Encounter (Signed)
Yes she can just hold her Cefdinir for that one day. Then resume

## 2015-01-28 NOTE — Telephone Encounter (Signed)
Patient informed. 

## 2015-02-01 ENCOUNTER — Encounter: Payer: Self-pay | Admitting: Family Medicine

## 2015-02-01 DIAGNOSIS — G629 Polyneuropathy, unspecified: Secondary | ICD-10-CM | POA: Insufficient documentation

## 2015-02-01 DIAGNOSIS — E079 Disorder of thyroid, unspecified: Secondary | ICD-10-CM | POA: Insufficient documentation

## 2015-02-01 DIAGNOSIS — F418 Other specified anxiety disorders: Secondary | ICD-10-CM | POA: Insufficient documentation

## 2015-02-01 DIAGNOSIS — T7840XA Allergy, unspecified, initial encounter: Secondary | ICD-10-CM | POA: Insufficient documentation

## 2015-02-01 DIAGNOSIS — M419 Scoliosis, unspecified: Secondary | ICD-10-CM

## 2015-02-01 DIAGNOSIS — E785 Hyperlipidemia, unspecified: Secondary | ICD-10-CM | POA: Insufficient documentation

## 2015-02-01 DIAGNOSIS — M1990 Unspecified osteoarthritis, unspecified site: Secondary | ICD-10-CM | POA: Insufficient documentation

## 2015-02-01 DIAGNOSIS — R109 Unspecified abdominal pain: Secondary | ICD-10-CM

## 2015-02-01 HISTORY — DX: Scoliosis, unspecified: M41.9

## 2015-02-01 HISTORY — DX: Polyneuropathy, unspecified: G62.9

## 2015-02-01 HISTORY — DX: Unspecified abdominal pain: R10.9

## 2015-02-01 NOTE — Assessment & Plan Note (Signed)
Stable on Duloxetine 

## 2015-02-01 NOTE — Progress Notes (Signed)
Subjective:    [name], female    DOB: [birth year], 69 y.o.   MRN: [id number]  Chief Complaint  Patient presents with  . Establish Care    HPI Patient is in today for new patient appointment. Her major complaint today is of worsening right hip pain. She reports years worth of trouble with her right hip but recently it is flared to the point where she has difficulty with getting in and out of the car sitting sleeping and maintaining her ADLs. She denies any falls or recent trauma. She denies any incontinence or other acute painful complaints. She struggles with ongoing nasal congestion but denies any pruritus. Does use Claritin intermittently no fevers or she has had some sinus pressure and scratchy throat. Sees numerous specialists. Follows with optometry at [facility] with orthopaedics and pain management Dr Adams/Spivey. Denies CP/palp/SOB/HA/fevers/GI or GU c/o. Taking meds as prescribed  Past Medical History  Diagnosis Date  . Frequent headaches   . History of chicken pox   . Allergy   . Arthritis   . Depression   . Hyperlipidemia   . Thyroid disease   . Abdominal pain 02/01/2015  . Scoliosis 02/01/2015  . Peripheral neuropathy ([place]) 02/01/2015    Past Surgical History  Procedure Laterality Date  . Tubal ligation  1980    left fallopian tube removal  . 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 Metacarpophalangeal joint capsulectomy / capsulotomy  1995    Family History  Problem Relation Age of Onset  . Arthritis Mother   . Dementia Mother   . Mental illness Father     suicide  . Arthritis Maternal Grandmother   . Kidney disease Son     b/l multicystic kidney disease s/p 4 transplants    Social History   Social History  . Marital Status: Married    Spouse Name: N/A  . Number of Children: N/A  . Years of Education: 16   Occupational History  . retired    Social History Main Topics  . Smoking status: Never Smoker   .  Smokeless tobacco: Not on file  . Alcohol Use: 0.0 oz/week    0 Standard drinks or equivalent per week     Comment: rare wine  . Drug Use: No  . 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 Not on file   Social History Narrative    No outpatient prescriptions prior to visit.   No facility-administered medications prior to visit.    Allergies  Allergen Reactions  . Penicillins Hives  . Sulfa Antibiotics Hives    Review of Systems  Constitutional: Negative for fever, chills and malaise/fatigue.  HENT: Negative for congestion and hearing loss.   Eyes: Negative for discharge.  Respiratory: Negative for cough, sputum production and shortness of breath.   Cardiovascular: Negative for chest pain, palpitations and leg swelling.  Gastrointestinal: Negative for heartburn, nausea, vomiting, abdominal pain, diarrhea, constipation and blood in stool.  Genitourinary: Negative for dysuria, urgency, frequency and hematuria.  Musculoskeletal: Negative for myalgias, back pain and falls.  Skin: Negative for rash.  Neurological: Negative for dizziness, sensory change, loss of consciousness, weakness and headaches.  Endo/Heme/Allergies: Negative for environmental allergies. Does not bruise/bleed easily.  Psychiatric/Behavioral: Negative for depression and suicidal ideas. The patient is not nervous/anxious and does not have insomnia.  Objective:    Physical Exam  Constitutional: She is oriented to person, place, and time. She appears well-developed and well-nourished. No distress.  HENT:  Head: Normocephalic and atraumatic.  Eyes: Conjunctivae are normal.  Neck: Neck supple. No thyromegaly present.  Cardiovascular: Normal rate, regular rhythm and normal heart sounds.   No murmur heard. Pulmonary/Chest: Effort normal and breath sounds normal. No respiratory distress.  Abdominal: Soft.  Bowel sounds are normal. She exhibits no distension and no mass. There is no tenderness.  Musculoskeletal: She exhibits no edema.  Lymphadenopathy:    She has no cervical adenopathy.  Neurological: She is alert and oriented to person, place, and time.  Skin: Skin is warm and dry.  Psychiatric: She has a normal mood and affect. Her behavior is normal.    BP 135/71 mmHg  Pulse 83  Temp(Src) 98.3 F (36.8 C) (Oral)  Ht 5\' 6"  (1.676 m)  Wt 104 lb (47.174 kg)  BMI 16.79 kg/m2  SpO2 100% Wt Readings from Last 3 Encounters:  01/22/15 104 lb (47.174 kg)     Lab Results  Component Value Date   WBC 9.2 01/22/2015   HGB 13.9 01/22/2015   HCT 42.4 01/22/2015   PLT 257.0 01/22/2015   GLUCOSE 85 01/22/2015   CHOL 230* 01/22/2015   TRIG 73.0 01/22/2015   HDL 71.60 01/22/2015   LDLCALC 144* 01/22/2015   ALT 19 01/22/2015   AST 27 01/22/2015   NA 140 01/22/2015   K 4.0 01/22/2015   CL 100 01/22/2015   CREATININE 0.83 01/22/2015   BUN 16 01/22/2015   CO2 33* 01/22/2015   TSH 0.03* 01/22/2015    Lab Results  Component Value Date   TSH 0.03* 01/22/2015   Lab Results  Component Value Date   WBC 9.2 01/22/2015   HGB 13.9 01/22/2015   HCT 42.4 01/22/2015   MCV 85.2 01/22/2015   PLT 257.0 01/22/2015   Lab Results  Component Value Date   NA 140 01/22/2015   K 4.0 01/22/2015   CO2 33* 01/22/2015   GLUCOSE 85 01/22/2015   BUN 16 01/22/2015   CREATININE 0.83 01/22/2015   BILITOT 0.4 01/22/2015   ALKPHOS 54 01/22/2015   AST 27 01/22/2015   ALT 19 01/22/2015   PROT 6.9 01/22/2015   ALBUMIN 4.3 01/22/2015   CALCIUM 10.3 01/22/2015   GFR 72.08 01/22/2015   Lab Results  Component Value Date   CHOL 230* 01/22/2015   Lab Results  Component Value Date   HDL 71.60 01/22/2015   Lab Results  Component Value Date   LDLCALC 144* 01/22/2015   Lab Results  Component Value Date   TRIG 73.0 01/22/2015   Lab Results  Component Value Date   CHOLHDL 3 01/22/2015   No  results found for: HGBA1C     Assessment & Plan:   Problem List Items Addressed This Visit    Thyroid disease    On Levothyroxine, continue to monitor, TSH suppressed so dose dropped to 50      Relevant Medications   liothyronine (CYTOMEL) 25 MCG tablet   Scoliosis   Peripheral neuropathy (HCC)    Using Gabapentin to manage      Relevant Medications   gabapentin (NEURONTIN) 300 MG capsule   DULoxetine (CYMBALTA) 20 MG capsule   Hyperlipidemia    Encouraged heart healthy diet, increase exercise, avoid trans fats, consider a krill oil cap daily      Relevant Medications   furosemide (LASIX) 20 MG tablet  Other Relevant Orders   CBC (Completed)   TSH (Completed)   Vit D  25 hydroxy (rtn osteoporosis monitoring) (Completed)   Lipid panel (Completed)   Comprehensive metabolic panel (Completed)   Magnesium (Completed)   T3, free (Completed)   T4, free (Completed)   DG Abd 2 Views (Completed)   Depression    Stable on Duloxetine.      Relevant Medications   DULoxetine (CYMBALTA) 20 MG capsule   Arthritis     Notable pain in right hip, xray revealed changes and MRI then showed Advanced right hip osteoarthritis with associated marrow edema about the joint and tearing of the anterior, superior labrum. No acute Abnormality. Referred to orthopaedics for further consideration       Relevant Medications   HYDROcodone-acetaminophen (NORCO/VICODIN) 5-325 MG tablet   Abdominal pain    Xray shows some constipation and scoliosis. Encouraged increased hydration and fiber in diet. Daily probiotics. If bowels not moving can use MOM 2 tbls po in 4 oz of warm prune juice by mouth every 2-3 days. If no results then repeat in 4 hours with  Dulcolax suppository pr, may repeat again in 4 more hours as needed. Seek care if symptoms worsen. Consider daily Miralax and/or Dulcolax if symptoms persist.       Relevant Orders   DG Abd 2 Views (Completed)   US Aorta   DG HIP UNILAT WITH  PELVIS 2-3 VIEWS RIGHT (Completed)    Other Visit Diagnoses    Hypothyroidism, unspecified hypothyroidism type    -  Primary    Relevant Medications    liothyronine (CYTOMEL) 25 MCG tablet    Other Relevant Orders    CBC (Completed)    TSH (Completed)    Vit D  25 hydroxy (rtn osteoporosis monitoring) (Completed)    Lipid panel (Completed)    Comprehensive metabolic panel (Completed)    Magnesium (Completed)    T3, free (Completed)    T4, free (Completed)    DG Abd 2 Views (Completed)    Nonintractable headache, unspecified chronicity pattern, unspecified headache type        Relevant Medications    gabapentin (NEURONTIN) 300 MG capsule    DULoxetine (CYMBALTA) 20 MG capsule    HYDROcodone-acetaminophen (NORCO/VICODIN) 5-325 MG tablet    Other Relevant Orders    CBC (Completed)    TSH (Completed)    Vit D  25 hydroxy (rtn osteoporosis monitoring) (Completed)    Lipid panel (Completed)    Comprehensive metabolic panel (Completed)    Magnesium (Completed)    T3, free (Completed)    T4, free (Completed)    DG Abd 2 Views (Completed)    Osteoporosis        Relevant Medications    Calcium Carbonate-Vit D-Min (CALCIUM 1200 PO)    Cholecalciferol (VITAMIN D) 2000 UNITS CAPS    Other Relevant Orders    CBC (Completed)    TSH (Completed)    Vit D  25 hydroxy (rtn osteoporosis monitoring) (Completed)    Lipid panel (Completed)    Comprehensive metabolic panel (Completed)    Magnesium (Completed)    T3, free (Completed)    T4, free (Completed)    DG Abd 2 Views (Completed)    Muscle cramp        Relevant Orders    CBC (Completed)    TSH (Completed)    Vit D  25 hydroxy (rtn osteoporosis monitoring) (Completed)    Lipid panel (Completed)    Comprehensive metabolic  panel (Completed)    Magnesium (Completed)    T3, free (Completed)    T4, free (Completed)    DG Abd 2 Views (Completed)    Chronic fatigue        Relevant Orders    DG Abd 2 Views (Completed)    US Aorta     Orthostatic hypotension        Relevant Medications    furosemide (LASIX) 20 MG tablet    Other Relevant Orders    US Aorta    Dilatation of aorta (HCC)        Relevant Medications    furosemide (LASIX) 20 MG tablet    Other Relevant Orders    US Aorta    Sinusitis, unspecified chronicity, unspecified location        Relevant Medications    chlorpheniramine (CHLOR-TRIMETON) 4 MG tablet    cefdinir (OMNICEF) 300 MG capsule       I am having Ms. Vogt-Nichols start on cefdinir. I am also having her maintain her liothyronine, furosemide, gabapentin, DULoxetine, chlorpheniramine, b complex vitamins, Calcium Carbonate-Vit D-Min (CALCIUM 1200 PO), Vitamin D, and HYDROcodone-acetaminophen.  Meds ordered this encounter  Medications  . DISCONTD: levothyroxine (SYNTHROID, LEVOTHROID) 75 MCG tablet    Sig: Take 75 mcg by mouth daily.  Marland Kitchen liothyronine (CYTOMEL) 25 MCG tablet    Sig: Take by mouth daily.  . furosemide (LASIX) 20 MG tablet    Sig: Take 20 mg by mouth daily.  Marland Kitchen gabapentin (NEURONTIN) 300 MG capsule    Sig: Take 300 mg by mouth at bedtime.  . DULoxetine (CYMBALTA) 20 MG capsule    Sig: Take 20 mg by mouth daily.  . chlorpheniramine (CHLOR-TRIMETON) 4 MG tablet    Sig: Take 4 mg by mouth 2 (two) times daily.  Marland Kitchen b complex vitamins tablet    Sig: Take 1 tablet by mouth daily.  . Calcium Carbonate-Vit D-Min (CALCIUM 1200 PO)    Sig: Take 1,200 mg by mouth daily.  . Cholecalciferol (VITAMIN D) 2000 UNITS CAPS    Sig: Take 2,000 Units by mouth daily.  Marland Kitchen HYDROcodone-acetaminophen (NORCO/VICODIN) 5-325 MG tablet    Sig: Take 1 tablet by mouth daily.  . cefdinir (OMNICEF) 300 MG capsule    Sig: Take 1 capsule (300 mg total) by mouth 2 (two) times daily.    Dispense:  28 capsule    Refill:  0     Penni Homans, MD

## 2015-02-01 NOTE — Assessment & Plan Note (Signed)
Notable pain in right hip, xray revealed changes and MRI then showed Advanced right hip osteoarthritis with associated marrow edema about the joint and tearing of the anterior, superior labrum. No acute Abnormality. Referred to orthopaedics for further consideration

## 2015-02-01 NOTE — Assessment & Plan Note (Signed)
Xray shows some constipation and scoliosis. Encouraged increased hydration and fiber in diet. Daily probiotics. If bowels not moving can use MOM 2 tbls po in 4 oz of warm prune juice by mouth every 2-3 days. If no results then repeat in 4 hours with  Dulcolax suppository pr, may repeat again in 4 more hours as needed. Seek care if symptoms worsen. Consider daily Miralax and/or Dulcolax if symptoms persist.

## 2015-02-01 NOTE — Assessment & Plan Note (Signed)
Encouraged heart healthy diet, increase exercise, avoid trans fats, consider a krill oil cap daily 

## 2015-02-01 NOTE — Assessment & Plan Note (Signed)
Using Gabapentin to manage

## 2015-02-01 NOTE — Assessment & Plan Note (Addendum)
On Levothyroxine, continue to monitor, TSH suppressed so dose dropped to 50

## 2015-02-06 DIAGNOSIS — G894 Chronic pain syndrome: Secondary | ICD-10-CM | POA: Diagnosis not present

## 2015-02-06 DIAGNOSIS — M25559 Pain in unspecified hip: Secondary | ICD-10-CM | POA: Diagnosis not present

## 2015-02-06 DIAGNOSIS — M47817 Spondylosis without myelopathy or radiculopathy, lumbosacral region: Secondary | ICD-10-CM | POA: Diagnosis not present

## 2015-02-06 DIAGNOSIS — M5137 Other intervertebral disc degeneration, lumbosacral region: Secondary | ICD-10-CM | POA: Diagnosis not present

## 2015-02-06 DIAGNOSIS — M1288 Other specific arthropathies, not elsewhere classified, other specified site: Secondary | ICD-10-CM | POA: Diagnosis not present

## 2015-02-06 DIAGNOSIS — Z79899 Other long term (current) drug therapy: Secondary | ICD-10-CM | POA: Diagnosis not present

## 2015-02-13 DIAGNOSIS — Z471 Aftercare following joint replacement surgery: Secondary | ICD-10-CM | POA: Diagnosis not present

## 2015-02-13 DIAGNOSIS — M1611 Unilateral primary osteoarthritis, right hip: Secondary | ICD-10-CM | POA: Diagnosis not present

## 2015-02-13 DIAGNOSIS — Z96642 Presence of left artificial hip joint: Secondary | ICD-10-CM | POA: Diagnosis not present

## 2015-02-18 ENCOUNTER — Telehealth: Payer: Self-pay | Admitting: *Deleted

## 2015-02-18 NOTE — Telephone Encounter (Signed)
Surgical clearance form received via fax from [place] for Right Hip: THA w/wo autograft/allograft.  Please call pt and schedule a 30 min surgical clearance appt with Dr. Charlett Blake. Thanks, JG//CMA

## 2015-02-18 NOTE — Telephone Encounter (Signed)
LVM advising patient of message below °

## 2015-02-18 NOTE — Telephone Encounter (Signed)
Pt has been scheduled for 2/10 at 11:00am for surgical clearance

## 2015-02-19 NOTE — Telephone Encounter (Signed)
Noted, form given to [place]. JG//CMA

## 2015-02-21 DIAGNOSIS — M1611 Unilateral primary osteoarthritis, right hip: Secondary | ICD-10-CM | POA: Diagnosis not present

## 2015-03-05 ENCOUNTER — Encounter: Payer: Self-pay | Admitting: Family Medicine

## 2015-03-05 ENCOUNTER — Ambulatory Visit (INDEPENDENT_AMBULATORY_CARE_PROVIDER_SITE_OTHER): Payer: Medicare Other | Admitting: Family Medicine

## 2015-03-05 VITALS — BP 135/70 | HR 73 | Temp 97.7°F | Ht 66.0 in | Wt 100.1 lb

## 2015-03-05 DIAGNOSIS — H6593 Unspecified nonsuppurative otitis media, bilateral: Secondary | ICD-10-CM

## 2015-03-05 DIAGNOSIS — E785 Hyperlipidemia, unspecified: Secondary | ICD-10-CM | POA: Diagnosis not present

## 2015-03-05 DIAGNOSIS — H9191 Unspecified hearing loss, right ear: Secondary | ICD-10-CM

## 2015-03-05 DIAGNOSIS — J069 Acute upper respiratory infection, unspecified: Secondary | ICD-10-CM

## 2015-03-05 DIAGNOSIS — T7840XD Allergy, unspecified, subsequent encounter: Secondary | ICD-10-CM

## 2015-03-05 DIAGNOSIS — E079 Disorder of thyroid, unspecified: Secondary | ICD-10-CM

## 2015-03-05 MED ORDER — DULOXETINE HCL 20 MG PO CPEP: 20.0000 mg | ORAL_CAPSULE | Freq: Every day | ORAL | Status: DC

## 2015-03-05 MED ORDER — LIOTHYRONINE SODIUM 25 MCG PO TABS: 25.0000 ug | ORAL_TABLET | Freq: Every day | ORAL | Status: DC

## 2015-03-05 MED ORDER — FUROSEMIDE 20 MG PO TABS: 20.0000 mg | ORAL_TABLET | Freq: Every day | ORAL | Status: DC

## 2015-03-05 MED ORDER — GABAPENTIN 300 MG PO CAPS: 300.0000 mg | ORAL_CAPSULE | Freq: Every day | ORAL | Status: DC

## 2015-03-05 MED ORDER — LEVOTHYROXINE SODIUM 50 MCG PO TABS: 50.0000 ug | ORAL_TABLET | Freq: Every day | ORAL | Status: DC

## 2015-03-05 NOTE — Patient Instructions (Signed)
Serous Otitis Media Serous otitis media is fluid in the middle ear space. This space contains the bones for hearing and air. Air in the middle ear space helps to transmit sound.  The air gets there through the eustachian tube. This tube goes from the back of the nose (nasopharynx) to the middle ear space. It keeps the pressure in the middle ear the same as the outside world. It also helps to drain fluid from the middle ear space. CAUSES  Serous otitis media occurs when the eustachian tube gets blocked. Blockage can come from:  Ear infections.  Colds and other upper respiratory infections.  Allergies.  Irritants such as cigarette smoke.  Sudden changes in air pressure (such as descending in an airplane).  Enlarged adenoids.  A mass in the nasopharynx. During colds and upper respiratory infections, the middle ear space can become temporarily filled with fluid. This can happen after an ear infection also. Once the infection clears, the fluid will generally drain out of the ear through the eustachian tube. If it does not, then serous otitis media occurs. SIGNS AND SYMPTOMS   Hearing loss.  A feeling of fullness in the ear, without pain.  Young children may not show any symptoms but may show slight behavioral changes, such as agitation, ear pulling, or crying. DIAGNOSIS  Serous otitis media is diagnosed by an ear exam. Tests may be done to check on the movement of the eardrum. Hearing exams may also be done. TREATMENT  The fluid most often goes away without treatment. If allergy is the cause, allergy treatment may be helpful. Fluid that persists for several months may require minor surgery. A small tube is placed in the eardrum to:  Drain the fluid.  Restore the air in the middle ear space. In certain situations, antibiotic medicines are used to avoid surgery. Surgery may be done to remove enlarged adenoids (if this is the cause). HOME CARE INSTRUCTIONS   Keep children away from  tobacco smoke.  Keep all follow-up visits as directed by your health care provider. SEEK MEDICAL CARE IF:   Your hearing is not better in 3 months.  Your hearing is worse.  You have ear pain.  You have drainage from the ear.  You have dizziness.  You have serous otitis media only in one ear or have any bleeding from your nose (epistaxis).  You notice a lump on your neck. MAKE SURE YOU:  Understand these instructions.   Will watch your condition.   Will get help right away if you are not doing well or get worse.    This information is not intended to replace advice given to you by your health care provider. Make sure you discuss any questions you have with your health care provider.   Document Released: 06/20/2003 Document Revised: 04/20/2014 Document Reviewed: 10/25/2012 Elsevier Interactive Patient Education 2016 [employer]  

## 2015-03-05 NOTE — Progress Notes (Signed)
Pre visit review using our clinic review tool, if applicable. No additional management support is needed unless otherwise documented below in the visit note. 

## 2015-03-10 ENCOUNTER — Encounter: Payer: Self-pay | Admitting: Family Medicine

## 2015-03-10 DIAGNOSIS — J069 Acute upper respiratory infection, unspecified: Secondary | ICD-10-CM

## 2015-03-10 DIAGNOSIS — H9191 Unspecified hearing loss, right ear: Secondary | ICD-10-CM

## 2015-03-10 HISTORY — DX: Unspecified hearing loss, right ear: H91.91

## 2015-03-10 HISTORY — DX: Acute upper respiratory infection, unspecified: J06.9

## 2015-03-10 NOTE — Assessment & Plan Note (Signed)
Encouraged heart healthy diet, increase exercise, avoid trans fats, consider a krill oil cap daily 

## 2015-03-10 NOTE — Assessment & Plan Note (Signed)
On Levothyroxine, continue to monitor. TSH abnormal, asymptomatic, will monitor

## 2015-03-10 NOTE — Assessment & Plan Note (Signed)
With allergies and SOM, is referred to ENT for further consideration

## 2015-03-10 NOTE — Progress Notes (Signed)
Subjective:    [name], female    DOB: [birth year], 69 y.o.   MRN: [id number]  Chief Complaint  Patient presents with  . Follow-up    HPI Patient is in today for follow up. Has been struggling with increased head congestion and pruritus. Allergies have been plaguing her routinely but over the past week the congestion has gotten worse, has had some improvement with Mucinex and antibiotics. No fevers or chills. Is complaining of increased fatigue and malaise. Notes some right ear hearing loss as well. Denies CP/palp/SOB/HA/fevers/GI or GU c/o. Taking meds as prescribed  Past Medical History  Diagnosis Date  . Frequent headaches   . History of chicken pox   . Allergy   . Arthritis   . Depression   . Hyperlipidemia   . Thyroid disease   . Abdominal pain 02/01/2015  . Scoliosis 02/01/2015  . Peripheral neuropathy ([place]) 02/01/2015  . URI, acute 03/10/2015  . Hearing loss in right ear 03/10/2015    Past Surgical History  Procedure Laterality Date  . Tubal ligation  1980    left fallopian tube removal  . 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 Metacarpophalangeal joint capsulectomy / capsulotomy  1995    Family History  Problem Relation Age of Onset  . Arthritis Mother   . Dementia Mother   . Mental illness Father     suicide  . Arthritis Maternal Grandmother   . Kidney disease Son     b/l multicystic kidney disease s/p 4 transplants    Social History   Social History  . Marital Status: Married    Spouse Name: N/A  . Number of Children: N/A  . Years of Education: 16   Occupational History  . retired    Social History Main Topics  . Smoking status: Never Smoker   . Smokeless tobacco: Not on file  . Alcohol Use: 0.0 oz/week    0 Standard drinks or equivalent per week     Comment: rare wine  . Drug Use: No  . 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 Not on file   Social History Narrative    Outpatient Prescriptions Prior to Visit  Medication Sig Dispense Refill  . b complex vitamins tablet Take 1 tablet by mouth daily.    . Calcium Carbonate-Vit D-Min (CALCIUM 1200 PO) Take 1,200 mg by mouth daily.    . Cholecalciferol (VITAMIN D) 2000 UNITS CAPS Take 2,000 Units by mouth daily.    Marland Kitchen HYDROcodone-acetaminophen (NORCO/VICODIN) 5-325 MG tablet Take 1 tablet by mouth daily.    . DULoxetine (CYMBALTA) 20 MG capsule Take 20 mg by mouth daily.    . furosemide (LASIX) 20 MG tablet Take 20 mg by mouth daily.    Marland Kitchen gabapentin (NEURONTIN) 300 MG capsule Take 300 mg by mouth at bedtime.    Marland Kitchen levothyroxine (SYNTHROID, LEVOTHROID) 50 MCG tablet Take 1 tablet (50 mcg total) by mouth daily. 90 tablet 1  . liothyronine (CYTOMEL) 25 MCG tablet Take by mouth daily.    . chlorpheniramine (CHLOR-TRIMETON) 4 MG tablet Take 4 mg by mouth 2 (two) times daily.     No facility-administered medications prior to visit.    Allergies  Allergen Reactions  . Penicillins Hives  . Sulfa Antibiotics Hives    Review of Systems  Constitutional: Negative for fever  and malaise/fatigue.  Eyes: Negative for discharge.  Respiratory: Positive for cough and sputum production. Negative for shortness of breath.   Cardiovascular: Negative for chest pain, palpitations and leg swelling.  Gastrointestinal: Negative for nausea and abdominal pain.  Genitourinary: Negative for dysuria.  Musculoskeletal: Negative for falls.  Skin: Negative for rash.  Neurological: Negative for loss of consciousness and headaches.  Endo/Heme/Allergies: Negative for environmental allergies.  Psychiatric/Behavioral: Negative for depression. The patient is not nervous/anxious.        Objective:    Physical Exam  Constitutional: She is oriented to person, place, and time. She appears well-developed and well-nourished. No distress.  HENT:  Head: Normocephalic and  atraumatic.  Nose: Nose normal.  Eyes: Right eye exhibits no discharge. Left eye exhibits no discharge.  Neck: Normal range of motion. Neck supple.  Cardiovascular: Normal rate and regular rhythm.   No murmur heard. Pulmonary/Chest: Effort normal and breath sounds normal.  Abdominal: Soft. Bowel sounds are normal. There is no tenderness.  Musculoskeletal: She exhibits no edema.  Neurological: She is alert and oriented to person, place, and time.  Skin: Skin is warm and dry.  Psychiatric: She has a normal mood and affect.  Nursing note and vitals reviewed.   BP 135/70 mmHg  Pulse 73  Temp(Src) 97.7 F (36.5 C) (Oral)  Ht 5\' 6"  (1.676 m)  Wt 100 lb 2 oz (45.416 kg)  BMI 16.17 kg/m2  SpO2 100% Wt Readings from Last 3 Encounters:  03/05/15 100 lb 2 oz (45.416 kg)  01/22/15 104 lb (47.174 kg)     Lab Results  Component Value Date   WBC 9.2 01/22/2015   HGB 13.9 01/22/2015   HCT 42.4 01/22/2015   PLT 257.0 01/22/2015   GLUCOSE 85 01/22/2015   CHOL 230* 01/22/2015   TRIG 73.0 01/22/2015   HDL 71.60 01/22/2015   LDLCALC 144* 01/22/2015   ALT 19 01/22/2015   AST 27 01/22/2015   NA 140 01/22/2015   K 4.0 01/22/2015   CL 100 01/22/2015   CREATININE 0.83 01/22/2015   BUN 16 01/22/2015   CO2 33* 01/22/2015   TSH 0.03* 01/22/2015    Lab Results  Component Value Date   TSH 0.03* 01/22/2015   Lab Results  Component Value Date   WBC 9.2 01/22/2015   HGB 13.9 01/22/2015   HCT 42.4 01/22/2015   MCV 85.2 01/22/2015   PLT 257.0 01/22/2015   Lab Results  Component Value Date   NA 140 01/22/2015   K 4.0 01/22/2015   CO2 33* 01/22/2015   GLUCOSE 85 01/22/2015   BUN 16 01/22/2015   CREATININE 0.83 01/22/2015   BILITOT 0.4 01/22/2015   ALKPHOS 54 01/22/2015   AST 27 01/22/2015   ALT 19 01/22/2015   PROT 6.9 01/22/2015   ALBUMIN 4.3 01/22/2015   CALCIUM 10.3 01/22/2015   GFR 72.08 01/22/2015   Lab Results  Component Value Date   CHOL 230* 01/22/2015   Lab  Results  Component Value Date   HDL 71.60 01/22/2015   Lab Results  Component Value Date   LDLCALC 144* 01/22/2015   Lab Results  Component Value Date   TRIG 73.0 01/22/2015   Lab Results  Component Value Date   CHOLHDL 3 01/22/2015   No results found for: HGBA1C     Assessment & Plan:   Problem List Items Addressed This Visit    Allergy    Zyrtec and Fonase daily      Hearing loss in right  ear    With allergies and SOM, is referred to ENT for further consideration      Hyperlipidemia    Encouraged heart healthy diet, increase exercise, avoid trans fats, consider a krill oil cap daily      Relevant Medications   furosemide (LASIX) 20 MG tablet   Thyroid disease    On Levothyroxine, continue to monitor. TSH abnormal, asymptomatic, will monitor      Relevant Medications   levothyroxine (SYNTHROID, LEVOTHROID) 50 MCG tablet   liothyronine (CYTOMEL) 25 MCG tablet   Other Relevant Orders   TSH   T4, free   T3, free   URI, acute    Encouraged increased rest and hydration, add probiotics, zinc such as Coldeze or Xicam. Treat fevers as needed       Other Visit Diagnoses    Hearing loss, right    -  Primary    Relevant Orders    Ambulatory referral to ENT    [facility] (secretory otitis media), bilateral        Relevant Orders    Ambulatory referral to ENT       I have discontinued Ms. Vogt-Nichols's gabapentin and chlorpheniramine. I have also changed her furosemide and liothyronine. Additionally, I am having her start on gabapentin. Lastly, I am having her maintain her b complex vitamins, Calcium Carbonate-Vit D-Min (CALCIUM 1200 PO), Vitamin D, HYDROcodone-acetaminophen, levothyroxine, and DULoxetine.  Meds ordered this encounter  Medications  . levothyroxine (SYNTHROID, LEVOTHROID) 50 MCG tablet    Sig: Take 1 tablet (50 mcg total) by mouth daily.    Dispense:  90 tablet    Refill:  2  . furosemide (LASIX) 20 MG tablet    Sig: Take 1 tablet (20 mg total) by  mouth daily.    Dispense:  90 tablet    Refill:  2  . DISCONTD: DULoxetine (CYMBALTA) 20 MG capsule    Sig: Take 1 capsule (20 mg total) by mouth daily.    Dispense:  90 capsule    Refill:  2  . liothyronine (CYTOMEL) 25 MCG tablet    Sig: Take 1 tablet (25 mcg total) by mouth daily.    Dispense:  90 tablet    Refill:  2  . DULoxetine (CYMBALTA) 20 MG capsule    Sig: Take 1 capsule (20 mg total) by mouth daily.    Dispense:  30 capsule    Refill:  0  . gabapentin (NEURONTIN) 300 MG capsule    Sig: Take 1 capsule (300 mg total) by mouth at bedtime.    Dispense:  90 capsule    Refill:  3     Penni Homans, MD

## 2015-03-10 NOTE — Assessment & Plan Note (Signed)
Encouraged increased rest and hydration, add probiotics, zinc such as Coldeze or Xicam. Treat fevers as needed 

## 2015-03-10 NOTE — Assessment & Plan Note (Signed)
Zyrtec and Fonase daily

## 2015-03-12 DIAGNOSIS — H903 Sensorineural hearing loss, bilateral: Secondary | ICD-10-CM | POA: Diagnosis not present

## 2015-03-12 DIAGNOSIS — J3089 Other allergic rhinitis: Secondary | ICD-10-CM | POA: Diagnosis not present

## 2015-03-12 DIAGNOSIS — J310 Chronic rhinitis: Secondary | ICD-10-CM | POA: Diagnosis not present

## 2015-03-12 DIAGNOSIS — H6981 Other specified disorders of Eustachian tube, right ear: Secondary | ICD-10-CM | POA: Diagnosis not present

## 2015-03-12 DIAGNOSIS — H9041 Sensorineural hearing loss, unilateral, right ear, with unrestricted hearing on the contralateral side: Secondary | ICD-10-CM | POA: Diagnosis not present

## 2015-03-22 ENCOUNTER — Other Ambulatory Visit: Payer: Self-pay | Admitting: Otolaryngology

## 2015-03-22 DIAGNOSIS — H6981 Other specified disorders of Eustachian tube, right ear: Secondary | ICD-10-CM

## 2015-03-22 DIAGNOSIS — H903 Sensorineural hearing loss, bilateral: Secondary | ICD-10-CM

## 2015-03-29 DIAGNOSIS — M1611 Unilateral primary osteoarthritis, right hip: Secondary | ICD-10-CM | POA: Diagnosis not present

## 2015-04-03 ENCOUNTER — Ambulatory Visit
Admission: RE | Admit: 2015-04-03 | Discharge: 2015-04-03 | Disposition: A | Discharge status: Home / Self Care | Payer: Medicare Other | Source: Ambulatory Visit | Attending: Otolaryngology | Admitting: Otolaryngology

## 2015-04-03 DIAGNOSIS — H903 Sensorineural hearing loss, bilateral: Secondary | ICD-10-CM

## 2015-04-03 DIAGNOSIS — H6981 Other specified disorders of Eustachian tube, right ear: Secondary | ICD-10-CM

## 2015-04-03 DIAGNOSIS — H9101 Ototoxic hearing loss, right ear: Secondary | ICD-10-CM | POA: Diagnosis not present

## 2015-04-03 MED ORDER — GADOBENATE DIMEGLUMINE 529 MG/ML IV SOLN
9.0000 mL | Freq: Once | INTRAVENOUS | Status: AC | PRN
  Administered 2015-04-03: 9 mL via INTRAVENOUS

## 2015-04-25 ENCOUNTER — Other Ambulatory Visit (INDEPENDENT_AMBULATORY_CARE_PROVIDER_SITE_OTHER): Payer: Medicare Other

## 2015-04-25 DIAGNOSIS — E079 Disorder of thyroid, unspecified: Secondary | ICD-10-CM | POA: Diagnosis not present

## 2015-04-25 LAB — TSH: TSH: 0.02 u[IU]/mL — ABNORMAL LOW (ref 0.35–4.50)

## 2015-04-25 LAB — T4, FREE: Free T4: 0.73 ng/dL (ref 0.60–1.60)

## 2015-04-25 LAB — T3, FREE: T3, Free: 3.1 pg/mL (ref 2.3–4.2)

## 2015-04-29 DIAGNOSIS — M5137 Other intervertebral disc degeneration, lumbosacral region: Secondary | ICD-10-CM | POA: Diagnosis not present

## 2015-04-29 DIAGNOSIS — M47817 Spondylosis without myelopathy or radiculopathy, lumbosacral region: Secondary | ICD-10-CM | POA: Diagnosis not present

## 2015-04-29 DIAGNOSIS — G894 Chronic pain syndrome: Secondary | ICD-10-CM | POA: Diagnosis not present

## 2015-04-29 DIAGNOSIS — M25559 Pain in unspecified hip: Secondary | ICD-10-CM | POA: Diagnosis not present

## 2015-04-29 DIAGNOSIS — Z79899 Other long term (current) drug therapy: Secondary | ICD-10-CM | POA: Diagnosis not present

## 2015-05-16 DIAGNOSIS — Z96642 Presence of left artificial hip joint: Secondary | ICD-10-CM | POA: Diagnosis not present

## 2015-05-16 DIAGNOSIS — Z471 Aftercare following joint replacement surgery: Secondary | ICD-10-CM | POA: Diagnosis not present

## 2015-05-16 DIAGNOSIS — M1611 Unilateral primary osteoarthritis, right hip: Secondary | ICD-10-CM | POA: Diagnosis not present

## 2015-05-17 ENCOUNTER — Ambulatory Visit: Payer: Self-pay | Admitting: Orthopedic Surgery

## 2015-05-17 NOTE — H&P (Signed)
Misty Barker DOB: 04-03-1946 Married / Language: English / Race: White Female Date of Admission: 06/12/2015 CC: Right Hip Pain History of Present Illness The patient is a 70 year old female who comes in for a preoperative History and Physical. The patient is scheduled for a right total hip arthroplasty (anterior) to be performed by [name]. Aluisio, MD at [facility] on 06-12-2015. The patient is a 70 year old female who presents today for follow up of their hip. The patient is being followed for their right hip pain and osteoarthritis. They are out from intra-articular injection. Symptoms reported include: aching and throbbing, while the patient does not report symptoms of: pain. The patient feels that they are doing poorly and report their pain level to be moderate to severe. The following medication has been used for pain control: anti-inflammatory medication and Hydrocodone. She takes one every afternoon for her back. She states that it doesnt seem to help with her hip pain. The patient has not gotten any relief of their symptoms with Cortisone injections. Her right hip has gotten progressively worse over time. The injection helped for a very short amount of time, may be for a couple of days. She is back to having pain at all times in the hip. It is limiting what she can and cannot do. She is ready to get the hip fixed. She is ready to proceed with surgery. They have been treated conservatively in the past for the above stated problem and despite conservative measures, they continue to have progressive pain and severe functional limitations and dysfunction. They have failed non-operative management including home exercise, medications, and injections. It is felt that they would benefit from undergoing total joint replacement. Risks and benefits of the procedure have been discussed with the patient and they elect to proceed with surgery. There are no active contraindications to  surgery such as ongoing infection or rapidly progressive neurological disease.  Problem List/Past Medical Status post left hip replacement [id number])  Primary osteoarthritis of right hip (M16.11)  Chronic Pain  Depression  Hypothyroidism  Osteoarthritis  Varicose veins  Degenerative Disc Disease  Measles  Menopause  Allergies Penicillamine *Assorted Classes  Hives. SulfADIAZINE *Sulfonamides  Hives.  Family History Kidney disease  child Osteoarthritis  mother, grandmother mothers side and grandfather mothers side Rheumatoid Arthritis  grandfather mothers side Father  Deceased. age 72 Mother  Deceased. age 58  Social History Alcohol use  current drinker; drinks wine; only occasionally per week Children  2 Current work status  retired Engineer, agricultural (Currently)  no Drug/Alcohol Rehab (Previously)  no Exercise  Exercises daily; does running / walking and gym / weights Illicit drug use  no Living situation  live with spouse Marital status  married Number of flights of stairs before winded  2-3 Pain Contract  yes Tobacco / smoke exposure  no Tobacco use  never smoker  Medication History Furosemide (20MG  Tablet, Oral daily) Active. Synthroid (50MCG Tablet, Oral daily) Active. Liothyronine Sodium (25MCG Tablet, Oral daily) Active. Gabapentin (300MG  Capsule, Oral at bedtime) Active. ZyrTEC (10MG  Tablet, Oral daily) Active. Vitamin B Complex (Oral daily) Active. Calcium Carbonate (Oral) Specific strength unknown - Active. Vitamin D (2000UNIT Capsule, Oral daily) Active. Hydrocodone-Acetaminophen (Oral) Specific strength unknown - Active. Cymbalta (20MG  Capsule DR Part, Oral) Active.  Past Surgical History Tonsillectomy  Total Hip Replacement  left   Review of Systems General Present- Fatigue. Not Present- Chills, Fever, Memory Loss, Night Sweats, Weight Gain and Weight Loss. Skin Not  Present- Eczema, Hives, Itching, Lesions  and Rash. HEENT Not Present- Dentures, Double Vision, Headache, Hearing Loss, Tinnitus and Visual Loss. Respiratory Not Present- Allergies, Chronic Cough, Coughing up blood, Shortness of breath at rest and Shortness of breath with exertion. Cardiovascular Not Present- Chest Pain, Difficulty Breathing Lying Down, Murmur, Palpitations, Racing/skipping heartbeats and Swelling. Gastrointestinal Not Present- Abdominal Pain, Bloody Stool, Constipation, Diarrhea, Difficulty Swallowing, Heartburn, Jaundice, Loss of appetitie, Nausea and Vomiting. Female Genitourinary Not Present- Blood in Urine, Discharge, Flank Pain, Incontinence, Painful Urination, Urgency, Urinary frequency, Urinary Retention, Urinating at Night and Weak urinary stream. Musculoskeletal Present- Back Pain, Joint Pain and Muscle Pain. Not Present- Joint Swelling, Morning Stiffness, Muscle Weakness and Spasms. Neurological Present- Difficulty with balance. Not Present- Blackout spells, Dizziness, Paralysis, Tremor and Weakness. Psychiatric Not Present- Insomnia.  Vitals  Weight: 100 lb Height: 66in Body Surface Area: 1.49 m Body Mass Index: 16.14 kg/m  Pulse: 72 (Regular)  BP: 128/76 (Sitting, Left Arm, Standard)   Physical Exam General Mental Status -Alert, cooperative and good historian. General Appearance-pleasant, Not in acute distress. Orientation-Oriented X3. Build & Nutrition-Petite, Well nourished and Well developed.  Head and Neck Head-normocephalic, atraumatic . Neck Global Assessment - supple, no bruit auscultated on the right, no bruit auscultated on the left.  Eye Vision-Wears corrective lenses. Pupil - Bilateral-Regular and Round. Motion - Bilateral-EOMI.  Chest and Lung Exam Auscultation Breath sounds - clear at anterior chest wall and clear at posterior chest wall. Adventitious sounds - No Adventitious sounds.  Cardiovascular Auscultation Rhythm - Regular rate and rhythm.  Heart Sounds - S1 WNL and S2 WNL. Murmurs & Other Heart Sounds: Murmur 1 - Location - Aortic Area and Sternal Border - Left. Timing - Early systolic. Grade - II/VI. Character - Low pitched.  Abdomen Palpation/Percussion Tenderness - Abdomen is non-tender to palpation. Rigidity (guarding) - Abdomen is soft. Auscultation Auscultation of the abdomen reveals - Bowel sounds normal.  Female Genitourinary Note: Not done, not pertinent to present illness   Musculoskeletal Note: On exam, she is alert and oriented, in no apparent distress. Her right hip can be flexed to 90, minimal internal rotation about 10 to 20 with external rotation 20, abduction. Left hip has flexion 120, rotation in 30, out 40, abduction 40 without discomfort.  Radiographs are reviewed. AP pelvis and lateral of the hips showing that the prosthesis on the left is in good position. No abnormalities. On the right, she has bone-on-bone arthritis with subchondral cystic formation.   Assessment & Plan Status post left hip replacement [id number]) Primary osteoarthritis of right hip (M16.11)  Note:Surgical Plans: Right Total Hip Replacement - Anterior Approach  Disposition: Home  PCP: Dr. Penni Homans - pending at time of H&P  IV TXA  Anesthesia Issues: Previous nausea in the past  Signed electronically by Darcie Mellone Monika Salk, III PA-C

## 2015-05-17 NOTE — H&P (Signed)
Misty Barker DOB: September 24, 1945 Married / Language: English / Race: White Female Date of Admission:  06/12/2015 CC:  Right Hip Pain History of Present Illness The patient is a 70 year old female who comes in for a preoperative History and Physical. The patient is scheduled for a right total hip arthroplasty (anterior) to be performed by [name]. Aluisio, MD at [facility] on 06-12-2015. The patient is a 70 year old female who presents today for follow up of their hip. The patient is being followed for their right hip pain and osteoarthritis. They are out from intra-articular injection. Symptoms reported include: aching and throbbing, while the patient does not report symptoms of: pain. The patient feels that they are doing poorly and report their pain level to be moderate to severe. The following medication has been used for pain control: anti-inflammatory medication and Hydrocodone. She takes one every afternoon for her back. She states that it doesnt seem to help with her hip pain. The patient has not gotten any relief of their symptoms with Cortisone injections. Her right hip has gotten progressively worse over time. The injection helped for a very short amount of time, may be for a couple of days. She is back to having pain at all times in the hip. It is limiting what she can and cannot do. She is ready to get the hip fixed. She is ready to proceed with surgery. They have been treated conservatively in the past for the above stated problem and despite conservative measures, they continue to have progressive pain and severe functional limitations and dysfunction. They have failed non-operative management including home exercise, medications, and injections. It is felt that they would benefit from undergoing total joint replacement. Risks and benefits of the procedure have been discussed with the patient and they elect to proceed with surgery. There are no active contraindications to  surgery such as ongoing infection or rapidly progressive neurological disease.  Problem List/Past Medical Status post left hip replacement [id number])  Primary osteoarthritis of right hip (M16.11)  Chronic Pain  Depression  Hypothyroidism  Osteoarthritis  Varicose veins  Degenerative Disc Disease  Measles  Menopause  Allergies Penicillamine *Assorted Classes  Hives. SulfADIAZINE *Sulfonamides  Hives.  Family History Kidney disease  child Osteoarthritis  mother, grandmother mothers side and grandfather mothers side Rheumatoid Arthritis  grandfather mothers side Father  Deceased. age 30 Mother  Deceased. age 53  Social History Alcohol use  current drinker; drinks wine; only occasionally per week Children  2 Current work status  retired Engineer, agricultural (Currently)  no Drug/Alcohol Rehab (Previously)  no Exercise  Exercises daily; does running / walking and gym / weights Illicit drug use  no Living situation  live with spouse Marital status  married Number of flights of stairs before winded  2-3 Pain Contract  yes Tobacco / smoke exposure  no Tobacco use  never smoker  Medication History Furosemide (20MG  Tablet, Oral daily) Active. Synthroid (50MCG Tablet, Oral daily) Active. Liothyronine Sodium (25MCG Tablet, Oral daily) Active. Gabapentin (300MG  Capsule, Oral at bedtime) Active. ZyrTEC (10MG  Tablet, Oral daily) Active. Vitamin B Complex (Oral daily) Active. Calcium Carbonate (Oral) Specific strength unknown - Active. Vitamin D (2000UNIT Capsule, Oral daily) Active. Hydrocodone-Acetaminophen (Oral) Specific strength unknown - Active. Cymbalta (20MG  Capsule DR Part, Oral) Active.  Past Surgical History Tonsillectomy  Total Hip Replacement  left   Review of Systems General Present- Fatigue. Not Present- Chills, Fever, Memory Loss, Night Sweats, Weight Gain and Weight Loss.  Skin Not Present- Eczema, Hives, Itching, Lesions  and Rash. HEENT Not Present- Dentures, Double Vision, Headache, Hearing Loss, Tinnitus and Visual Loss. Respiratory Not Present- Allergies, Chronic Cough, Coughing up blood, Shortness of breath at rest and Shortness of breath with exertion. Cardiovascular Not Present- Chest Pain, Difficulty Breathing Lying Down, Murmur, Palpitations, Racing/skipping heartbeats and Swelling. Gastrointestinal Not Present- Abdominal Pain, Bloody Stool, Constipation, Diarrhea, Difficulty Swallowing, Heartburn, Jaundice, Loss of appetitie, Nausea and Vomiting. Female Genitourinary Not Present- Blood in Urine, Discharge, Flank Pain, Incontinence, Painful Urination, Urgency, Urinary frequency, Urinary Retention, Urinating at Night and Weak urinary stream. Musculoskeletal Present- Back Pain, Joint Pain and Muscle Pain. Not Present- Joint Swelling, Morning Stiffness, Muscle Weakness and Spasms. Neurological Present- Difficulty with balance. Not Present- Blackout spells, Dizziness, Paralysis, Tremor and Weakness. Psychiatric Not Present- Insomnia.  Vitals  Weight: 100 lb Height: 66in Body Surface Area: 1.49 m Body Mass Index: 16.14 kg/m  Pulse: 72 (Regular)  BP: 128/76 (Sitting, Left Arm, Standard)   Physical Exam General Mental Status -Alert, cooperative and good historian. General Appearance-pleasant, Not in acute distress. Orientation-Oriented X3. Build & Nutrition-Petite, Well nourished and Well developed.  Head and Neck Head-normocephalic, atraumatic . Neck Global Assessment - supple, no bruit auscultated on the right, no bruit auscultated on the left.  Eye Vision-Wears corrective lenses. Pupil - Bilateral-Regular and Round. Motion - Bilateral-EOMI.  Chest and Lung Exam Auscultation Breath sounds - clear at anterior chest wall and clear at posterior chest wall. Adventitious sounds - No Adventitious sounds.  Cardiovascular Auscultation Rhythm - Regular rate and rhythm.  Heart Sounds - S1 WNL and S2 WNL. Murmurs & Other Heart Sounds: Murmur 1 - Location - Aortic Area and Sternal Border - Left. Timing - Early systolic. Grade - II/VI. Character - Low pitched.  Abdomen Palpation/Percussion Tenderness - Abdomen is non-tender to palpation. Rigidity (guarding) - Abdomen is soft. Auscultation Auscultation of the abdomen reveals - Bowel sounds normal.  Female Genitourinary Note: Not done, not pertinent to present illness   Musculoskeletal Note: On exam, she is alert and oriented, in no apparent distress. Her right hip can be flexed to 90, minimal internal rotation about 10 to 20 with external rotation 20, abduction. Left hip has flexion 120, rotation in 30, out 40, abduction 40 without discomfort.  Radiographs are reviewed. AP pelvis and lateral of the hips showing that the prosthesis on the left is in good position. No abnormalities. On the right, she has bone-on-bone arthritis with subchondral cystic formation.   Assessment & Plan Status post left hip replacement [id number]) Primary osteoarthritis of right hip (M16.11)  Note:Surgical Plans: Right Total Hip Replacement - Anterior Approach  Disposition: Home  PCP: Dr. Penni Homans - pending at time of H&P  IV TXA  Anesthesia Issues: Previous nausea in the past  Signed electronically by Shanai Lartigue Monika Salk, III PA-C

## 2015-05-20 ENCOUNTER — Ambulatory Visit: Payer: Self-pay | Admitting: Orthopedic Surgery

## 2015-05-20 NOTE — Progress Notes (Signed)
Preoperative surgical orders have been place into the Epic hospital system for [place] on 05/20/2015, 10:06 AM  by Mickel Crow for surgery on 06-12-15.  Preop Total Hip - Anterior Approach orders including IV Tylenol, and IV Decadron as long as there are no contraindications to the above medications. Arlee Muslim, PA-C

## 2015-05-24 ENCOUNTER — Encounter: Payer: Self-pay | Admitting: Family Medicine

## 2015-05-24 ENCOUNTER — Ambulatory Visit (INDEPENDENT_AMBULATORY_CARE_PROVIDER_SITE_OTHER): Payer: Medicare Other | Admitting: Family Medicine

## 2015-05-24 VITALS — BP 138/80 | HR 69 | Temp 97.6°F | Ht 66.0 in | Wt 101.4 lb

## 2015-05-24 DIAGNOSIS — E079 Disorder of thyroid, unspecified: Secondary | ICD-10-CM | POA: Diagnosis not present

## 2015-05-24 DIAGNOSIS — E785 Hyperlipidemia, unspecified: Secondary | ICD-10-CM | POA: Diagnosis not present

## 2015-05-24 DIAGNOSIS — F329 Major depressive disorder, single episode, unspecified: Secondary | ICD-10-CM | POA: Diagnosis not present

## 2015-05-24 DIAGNOSIS — M25551 Pain in right hip: Secondary | ICD-10-CM

## 2015-05-24 DIAGNOSIS — F32A Depression, unspecified: Secondary | ICD-10-CM

## 2015-05-24 HISTORY — DX: Pain in right hip: M25.551

## 2015-05-24 NOTE — Progress Notes (Signed)
Subjective:    [name], female    DOB: 1946/02/18, 70 y.o.   MRN: [id number]  Chief Complaint  Patient presents with  . Medical Clearance    HPI Patient is in today for preop clearance. She is scheduled to undergo right total hip replacement on 06/12/2015. She is struggling with significant daily pain. She has difficulty with simple activities of daily living and is not sleeping well secondary to her significant pain. No recent falls or trauma. She reports otherwise she feels well. She denies any recent illness, recent hospitalization or acute concerns. She is anxious to proceed with surgery secondary to her pain. Denies CP/palp/SOB/HA/congestion/fevers/GI or GU c/o. Taking meds as prescribed  Past Medical History  Diagnosis Date  . Frequent headaches   . History of chicken pox   . Allergy   . Arthritis   . Depression   . Hyperlipidemia   . Thyroid disease   . Abdominal pain 02/01/2015  . Scoliosis 02/01/2015  . Peripheral neuropathy ([place]) 02/01/2015  . URI, acute 03/10/2015  . 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 Right hip pain 05/24/2015    Past Surgical History  Procedure Laterality Date  . Tubal ligation  1980    left fallopian tube removal  . 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 Metacarpophalangeal joint capsulectomy / capsulotomy  1995    Family History  Problem Relation Age of Onset  . Arthritis Mother   . Dementia Mother   . Mental illness Father     suicide  . Arthritis Maternal Grandmother   . Kidney disease Son     b/l multicystic kidney disease s/p 4 transplants    Social History   Social History  . Marital Status: Married    Spouse Name: N/A  . Number of Children: N/A  . Years of Education: 16   Occupational History  . retired    Social History Main Topics  . Smoking status: Never Smoker   . Smokeless tobacco: Not on file  . Alcohol Use: 0.0 oz/week    0 Standard drinks or equivalent per week   Comment: rare wine  . Drug Use: No  . 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 Not on file   Social History Narrative    Outpatient Prescriptions Prior to Visit  Medication Sig Dispense Refill  . b complex vitamins tablet Take 1 tablet by mouth daily.    . Calcium Carbonate-Vit D-Min (CALCIUM 1200 PO) Take 1,200 mg by mouth daily.    . Cholecalciferol (VITAMIN D) 2000 UNITS CAPS Take 2,000 Units by mouth daily.    . DULoxetine (CYMBALTA) 20 MG capsule Take 1 capsule (20 mg total) by mouth daily. 30 capsule 0  . furosemide (LASIX) 20 MG tablet Take 1 tablet (20 mg total) by mouth daily. 90 tablet 2  . gabapentin (NEURONTIN) 300 MG capsule Take 1 capsule (300 mg total) by mouth at bedtime. 90 capsule 3  . levothyroxine (SYNTHROID, LEVOTHROID) 50 MCG tablet Take 1 tablet (50 mcg total) by mouth daily. 90 tablet 2  . liothyronine (CYTOMEL) 25 MCG tablet Take 1 tablet (25 mcg total) by mouth daily. 90 tablet 2  . HYDROcodone-acetaminophen (NORCO/VICODIN) 5-325 MG tablet Take 1 tablet by mouth daily.     No facility-administered medications prior to visit.    Allergies  Allergen  Reactions  . Penicillins Hives    Has patient had a PCN reaction causing immediate rash, facial/tongue/throat swelling, SOB or lightheadedness with hypotension: No Has patient had a PCN reaction causing severe rash involving mucus membranes or skin necrosis: No Has patient had a PCN reaction that required hospitalization No Has patient had a PCN reaction occurring within the last 10 years: No If all of the above answers are "NO", then may proceed with Cephalosporin use.  . Sulfa Antibiotics Hives    Review of Systems  Constitutional: Negative for fever and malaise/fatigue.  HENT: Negative for congestion.   Eyes: Negative for discharge.  Respiratory: Negative for shortness of breath.   Cardiovascular: Negative  for chest pain, palpitations and leg swelling.  Gastrointestinal: Negative for nausea and abdominal pain.  Genitourinary: Negative for dysuria.  Musculoskeletal: Positive for joint pain. Negative for falls.  Skin: Negative for rash.  Neurological: Negative for loss of consciousness and headaches.  Endo/Heme/Allergies: Negative for environmental allergies.  Psychiatric/Behavioral: Negative for depression. The patient is not nervous/anxious.        Objective:    Physical Exam  Constitutional: She is oriented to person, place, and time. She appears well-developed and well-nourished. No distress.  HENT:  Head: Normocephalic and atraumatic.  Eyes: Conjunctivae are normal.  Neck: Neck supple. No thyromegaly present.  Cardiovascular: Normal rate, regular rhythm and normal heart sounds.   No murmur heard. Pulmonary/Chest: Effort normal and breath sounds normal. No respiratory distress.  Abdominal: Soft. Bowel sounds are normal. She exhibits no distension and no mass. There is no tenderness.  Musculoskeletal: She exhibits no edema.  Lymphadenopathy:    She has no cervical adenopathy.  Neurological: She is alert and oriented to person, place, and time.  Skin: Skin is warm and dry.  Psychiatric: She has a normal mood and affect. Her behavior is normal.    BP 138/80 mmHg  Pulse 69  Temp(Src) 97.6 F (36.4 C) (Oral)  Ht 5\' 6"  (1.676 m)  Wt 101 lb 6 oz (45.983 kg)  BMI 16.37 kg/m2  SpO2 97% Wt Readings from Last 3 Encounters:  05/24/15 101 lb 6 oz (45.983 kg)  03/05/15 100 lb 2 oz (45.416 kg)  01/22/15 104 lb (47.174 kg)     Lab Results  Component Value Date   WBC 9.2 01/22/2015   HGB 13.9 01/22/2015   HCT 42.4 01/22/2015   PLT 257.0 01/22/2015   GLUCOSE 85 01/22/2015   CHOL 230* 01/22/2015   TRIG 73.0 01/22/2015   HDL 71.60 01/22/2015   LDLCALC 144* 01/22/2015   ALT 19 01/22/2015   AST 27 01/22/2015   NA 140 01/22/2015   K 4.0 01/22/2015   CL 100 01/22/2015    CREATININE 0.83 01/22/2015   BUN 16 01/22/2015   CO2 33* 01/22/2015   TSH 0.02* 04/25/2015    Lab Results  Component Value Date   TSH 0.02* 04/25/2015   Lab Results  Component Value Date   WBC 9.2 01/22/2015   HGB 13.9 01/22/2015   HCT 42.4 01/22/2015   MCV 85.2 01/22/2015   PLT 257.0 01/22/2015   Lab Results  Component Value Date   NA 140 01/22/2015   K 4.0 01/22/2015   CO2 33* 01/22/2015   GLUCOSE 85 01/22/2015   BUN 16 01/22/2015   CREATININE 0.83 01/22/2015   BILITOT 0.4 01/22/2015   ALKPHOS 54 01/22/2015   AST 27 01/22/2015   ALT 19 01/22/2015   PROT 6.9 01/22/2015   ALBUMIN 4.3 01/22/2015  CALCIUM 10.3 01/22/2015   GFR 72.08 01/22/2015   Lab Results  Component Value Date   CHOL 230* 01/22/2015   Lab Results  Component Value Date   HDL 71.60 01/22/2015   Lab Results  Component Value Date   LDLCALC 144* 01/22/2015   Lab Results  Component Value Date   TRIG 73.0 01/22/2015   Lab Results  Component Value Date   CHOLHDL 3 01/22/2015   No results found for: HGBA1C     Assessment & Plan:   Problem List Items Addressed This Visit    Depression    Stable on Duloxetine, continue the same      Hyperlipidemia    Encouraged heart healthy diet, increase exercise, avoid trans fats, consider a krill oil cap daily      Right hip pain - Primary    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 Is anxious to proceed with surgical THR with ortho. She will have preop labs and EKG with them. If no new illness develops she is medically cleared for surgery on 06/12/2015. She will discuss any concerns with surgeon prior to surgery, is to avoid all OTC preparations prior to surgery for at least 1 week.      Thyroid disease    Stable on current meds         I am having Ms. Vogt-Nichols maintain her b complex vitamins, Calcium Carbonate-Vit D-Min (CALCIUM 1200 PO), Vitamin D, levothyroxine, furosemide, liothyronine, DULoxetine, and  gabapentin.  No orders of the defined types were placed in this encounter.     Penni Homans, MD

## 2015-05-24 NOTE — Patient Instructions (Signed)

## 2015-05-24 NOTE — Progress Notes (Signed)
Pre visit review using our clinic review tool, if applicable. No additional management support is needed unless otherwise documented below in the visit note. 

## 2015-05-26 DIAGNOSIS — R300 Dysuria: Secondary | ICD-10-CM | POA: Diagnosis not present

## 2015-05-26 DIAGNOSIS — N390 Urinary tract infection, site not specified: Secondary | ICD-10-CM | POA: Diagnosis not present

## 2015-05-27 ENCOUNTER — Encounter: Payer: Self-pay | Admitting: Family Medicine

## 2015-05-27 NOTE — Assessment & Plan Note (Signed)
Encouraged heart healthy diet, increase exercise, avoid trans fats, consider a krill oil cap daily 

## 2015-05-27 NOTE — Assessment & Plan Note (Signed)
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 Is anxious to proceed with surgical THR with ortho. She will have preop labs and EKG with them. If no new illness develops she is medically cleared for surgery on 06/12/2015. She will discuss any concerns with surgeon prior to surgery, is to avoid all OTC preparations prior to surgery for at least 1 week.

## 2015-05-27 NOTE — Assessment & Plan Note (Signed)
Stable on Duloxetine, continue the same

## 2015-05-27 NOTE — Assessment & Plan Note (Signed)
Stable on current meds 

## 2015-05-28 ENCOUNTER — Telehealth: Payer: Self-pay | Admitting: Family Medicine

## 2015-05-28 MED ORDER — CIPROFLOXACIN HCL 500 MG PO TABS: 500.0000 mg | ORAL_TABLET | Freq: Two times a day (BID) | ORAL | Status: DC

## 2015-05-28 NOTE — Telephone Encounter (Signed)
Sent in Cipro to [address].  Patient informed antibiotic sent in.

## 2015-05-28 NOTE — Telephone Encounter (Signed)
Relation to [id number] Call back number: [phone]  Pharmacy: [phone]   Reason for call:   patient was seen at [facility] [address] [place], [place] [address] [phone]  Sunday 05/26/15 and they prescribed "nitrofurantoin" patient states its making her feel sick and she would like PCP to prescribe "cipro". Patient states she has a hip replacement surgery coming up with [facility] and they stated if her UTI is not cleared up she would have to postpone surgery and she has already been waiting 3 months. Please advise

## 2015-05-28 NOTE — Telephone Encounter (Signed)
OK to prescribe Ciprofloxacin 500 mg po bid x 5 days

## 2015-05-28 NOTE — Telephone Encounter (Signed)
Faxed to [facility] clearance for right hip surgery per PCP instructions.  AttnPollyann Savoy at [phone].

## 2015-05-31 ENCOUNTER — Other Ambulatory Visit (HOSPITAL_COMMUNITY): Payer: Medicare Other

## 2015-06-04 ENCOUNTER — Encounter (HOSPITAL_COMMUNITY)
Admission: RE | Admit: 2015-06-04 | Discharge: 2015-06-04 | Disposition: A | Discharge status: Home / Self Care | Payer: Medicare Other | Source: Ambulatory Visit | Attending: Orthopedic Surgery | Admitting: Orthopedic Surgery

## 2015-06-04 ENCOUNTER — Encounter (HOSPITAL_COMMUNITY): Payer: Self-pay

## 2015-06-04 DIAGNOSIS — E039 Hypothyroidism, unspecified: Secondary | ICD-10-CM | POA: Insufficient documentation

## 2015-06-04 DIAGNOSIS — Z01812 Encounter for preprocedural laboratory examination: Secondary | ICD-10-CM | POA: Diagnosis not present

## 2015-06-04 DIAGNOSIS — Z79899 Other long term (current) drug therapy: Secondary | ICD-10-CM | POA: Diagnosis not present

## 2015-06-04 DIAGNOSIS — Z880 Allergy status to penicillin: Secondary | ICD-10-CM | POA: Insufficient documentation

## 2015-06-04 DIAGNOSIS — Z96642 Presence of left artificial hip joint: Secondary | ICD-10-CM | POA: Diagnosis not present

## 2015-06-04 DIAGNOSIS — F329 Major depressive disorder, single episode, unspecified: Secondary | ICD-10-CM | POA: Insufficient documentation

## 2015-06-04 DIAGNOSIS — Z882 Allergy status to sulfonamides status: Secondary | ICD-10-CM | POA: Diagnosis not present

## 2015-06-04 DIAGNOSIS — Z0183 Encounter for blood typing: Secondary | ICD-10-CM | POA: Insufficient documentation

## 2015-06-04 DIAGNOSIS — M1611 Unilateral primary osteoarthritis, right hip: Secondary | ICD-10-CM | POA: Insufficient documentation

## 2015-06-04 HISTORY — DX: Other specified postprocedural states: Z98.890

## 2015-06-04 HISTORY — DX: Adverse effect of unspecified anesthetic, initial encounter: T41.45XA

## 2015-06-04 HISTORY — DX: Other complications of anesthesia, initial encounter: T88.59XA

## 2015-06-04 HISTORY — DX: Other specified postprocedural states: R11.2

## 2015-06-04 HISTORY — DX: Asymptomatic varicose veins of unspecified lower extremity: I83.90

## 2015-06-04 HISTORY — DX: Hypothyroidism, unspecified: E03.9

## 2015-06-04 LAB — PROTIME-INR
INR: 1.04 (ref 0.00–1.49)
PROTHROMBIN TIME: 13.4 s (ref 11.6–15.2)

## 2015-06-04 LAB — CBC
HCT: 41.1 % (ref 36.0–46.0)
Hemoglobin: 12.8 g/dL (ref 12.0–15.0)
MCH: 26.9 pg (ref 26.0–34.0)
MCHC: 31.1 g/dL (ref 30.0–36.0)
MCV: 86.5 fL (ref 78.0–100.0)
PLATELETS: 277 10*3/uL (ref 150–400)
RBC: 4.75 MIL/uL (ref 3.87–5.11)
RDW: 13.8 % (ref 11.5–15.5)
WBC: 6.6 10*3/uL (ref 4.0–10.5)

## 2015-06-04 LAB — COMPREHENSIVE METABOLIC PANEL
ALK PHOS: 53 U/L (ref 38–126)
ALT: 18 U/L (ref 14–54)
ANION GAP: 8 (ref 5–15)
AST: 27 U/L (ref 15–41)
Albumin: 4.3 g/dL (ref 3.5–5.0)
BUN: 10 mg/dL (ref 6–20)
CALCIUM: 9.9 mg/dL (ref 8.9–10.3)
CO2: 30 mmol/L (ref 22–32)
CREATININE: 0.68 mg/dL (ref 0.44–1.00)
Chloride: 98 mmol/L — ABNORMAL LOW (ref 101–111)
Glucose, Bld: 90 mg/dL (ref 65–99)
Potassium: 3.7 mmol/L (ref 3.5–5.1)
SODIUM: 136 mmol/L (ref 135–145)
Total Bilirubin: 0.6 mg/dL (ref 0.3–1.2)
Total Protein: 7 g/dL (ref 6.5–8.1)

## 2015-06-04 LAB — URINALYSIS, ROUTINE W REFLEX MICROSCOPIC
Bilirubin Urine: NEGATIVE
GLUCOSE, UA: NEGATIVE mg/dL
HGB URINE DIPSTICK: NEGATIVE
Ketones, ur: NEGATIVE mg/dL
LEUKOCYTES UA: NEGATIVE
Nitrite: NEGATIVE
PH: 6.5 (ref 5.0–8.0)
Protein, ur: NEGATIVE mg/dL
SPECIFIC GRAVITY, URINE: 1.009 (ref 1.005–1.030)

## 2015-06-04 LAB — SURGICAL PCR SCREEN
MRSA, PCR: NEGATIVE
STAPHYLOCOCCUS AUREUS: NEGATIVE

## 2015-06-04 LAB — ABO/RH: ABO/RH(D): AB POS

## 2015-06-04 LAB — APTT: aPTT: 30 seconds (ref 24–37)

## 2015-06-04 NOTE — Patient Instructions (Addendum)
Misty Barker  06/04/2015   Your procedure is scheduled on: 06-12-15  Report to [facility] Main  Entrance take [facility]  elevators to 3rd floor to  [place] at [place]  AM.  Call this number if you have problems the morning of surgery [phone]   Remember: ONLY 1 PERSON MAY GO WITH YOU TO SHORT STAY TO GET  READY MORNING OF YOUR SURGERY.  Do not eat food or drink liquids :After Midnight.     Take these medicines the morning of surgery with A SIP OF WATER: Cetirizine. Duloxetine.Levothyroxine. Liothyronine. Hydrocodone..  DO NOT TAKE ANY DIABETIC MEDICATIONS DAY OF YOUR SURGERY                               You may not have any metal on your body including hair pins and              piercings  Do not wear jewelry, make-up, lotions, powders or perfumes, deodorant             Do not wear nail polish.  Do not shave  48 hours prior to surgery.              Men may shave face and neck.   Do not bring valuables to the hospital. [place].  Contacts, dentures or bridgework may not be worn into surgery.  Leave suitcase in the car. After surgery it may be brought to your room.     Patients discharged the day of surgery will not be allowed to drive home.  Name and phone number of your driver:William Nichols-spouse [phone] cell  Special Instructions: N/A              Please read over the following fact sheets you were given: _____________________________________________________________________             [facility] - Preparing for Surgery Before surgery, you can play an important role.  Because skin is not sterile, your skin needs to be as free of germs as possible.  You can reduce the number of germs on your skin by washing with CHG (chlorahexidine gluconate) soap before surgery.  CHG is an antiseptic cleaner which kills germs and bonds with the skin to continue killing germs even after  washing. Please DO NOT use if you have an allergy to CHG or antibacterial soaps.  If your skin becomes reddened/irritated stop using the CHG and inform your nurse when you arrive at Short Stay. Do not shave (including legs and underarms) for at least 48 hours prior to the first CHG shower.  You may shave your face/neck. Please follow these instructions carefully:  1.  Shower with CHG Soap the night before surgery and the  morning of Surgery.  2.  If you choose to wash your hair, wash your hair first as usual with your  normal  shampoo.  3.  After you shampoo, rinse your hair and body thoroughly to remove the  shampoo.                           4.  Use CHG as you would any other liquid soap.  You can apply chg directly  to the skin and wash                       Gently with a scrungie or clean washcloth.  5.  Apply the CHG Soap to your body ONLY FROM THE NECK DOWN.   Do not use on face/ open                           Wound or open sores. Avoid contact with eyes, ears mouth and genitals (private parts).                       Wash face,  Genitals (private parts) with your normal soap.             6.  Wash thoroughly, paying special attention to the area where your surgery  will be performed.  7.  Thoroughly rinse your body with warm water from the neck down.  8.  DO NOT shower/wash with your normal soap after using and rinsing off  the CHG Soap.                9.  Pat yourself dry with a clean towel.            10.  Wear clean pajamas.            11.  Place clean sheets on your bed the night of your first shower and do not  sleep with pets. Day of Surgery : Do not apply any lotions/deodorants the morning of surgery.  Please wear clean clothes to the hospital/surgery center.  FAILURE TO FOLLOW THESE INSTRUCTIONS MAY RESULT IN THE CANCELLATION OF YOUR SURGERY PATIENT SIGNATURE_________________________________  NURSE  SIGNATURE__________________________________  ________________________________________________________________________   Adam Phenix  An incentive spirometer is a tool that can help keep your lungs clear and active. This tool measures how well you are filling your lungs with each breath. Taking long deep breaths may help reverse or decrease the chance of developing breathing (pulmonary) problems (especially infection) following:  A long period of time when you are unable to move or be active. BEFORE THE PROCEDURE   If the spirometer includes an indicator to show your best effort, your nurse or respiratory therapist will set it to a desired goal.  If possible, sit up straight or lean slightly forward. Try not to slouch.  Hold the incentive spirometer in an upright position. INSTRUCTIONS FOR USE   Sit on the edge of your bed if possible, or sit up as far as you can in bed or on a chair.  Hold the incentive spirometer in an upright position.  Breathe out normally.  Place the mouthpiece in your mouth and seal your lips tightly around it.  Breathe in slowly and as deeply as possible, raising the piston or the ball toward the top of the column.  Hold your breath for 3-5 seconds or for as long as possible. Allow the piston or ball to fall to the bottom of the column.  Remove the mouthpiece from your mouth and breathe out normally.  Rest for a few seconds and repeat Steps 1 through 7 at least 10 times every 1-2 hours when you are awake. Take your time and take a few normal breaths between deep breaths.  The spirometer may include an indicator to show your best effort. Use the indicator as a goal to work toward during each repetition.  After each  set of 10 deep breaths, practice coughing to be sure your lungs are clear. If you have an incision (the cut made at the time of surgery), support your incision when coughing by placing a pillow or rolled up towels firmly against it. Once  you are able to get out of bed, walk around indoors and cough well. You may stop using the incentive spirometer when instructed by your caregiver.  RISKS AND COMPLICATIONS  Take your time so you do not get dizzy or light-headed.  If you are in pain, you may need to take or ask for pain medication before doing incentive spirometry. It is harder to take a deep breath if you are having pain. AFTER USE  Rest and breathe slowly and easily.  It can be helpful to keep track of a log of your progress. Your caregiver can provide you with a simple table to help with this. If you are using the spirometer at home, follow these instructions: [place] IF:   You are having difficultly using the spirometer.  You have trouble using the spirometer as often as instructed.  Your pain medication is not giving enough relief while using the spirometer.  You develop fever of 100.5 F (38.1 C) or higher. SEEK IMMEDIATE MEDICAL CARE IF:   You cough up bloody sputum that had not been present before.  You develop fever of 102 F (38.9 C) or greater.  You develop worsening pain at or near the incision site. MAKE SURE YOU:   Understand these instructions.  Will watch your condition.  Will get help right away if you are not doing well or get worse. Document Released: 08/10/2006 Document Revised: 06/22/2011 Document Reviewed: 10/11/2006 ExitCare Patient Information 2014 ExitCare, [place].   ________________________________________________________________________  WHAT IS A BLOOD TRANSFUSION? Blood Transfusion Information  A transfusion is the replacement of blood or some of its parts. Blood is made up of multiple cells which provide different functions.  Red blood cells carry oxygen and are used for blood loss replacement.  White blood cells fight against infection.  Platelets control bleeding.  Plasma helps clot blood.  Other blood products are available for specialized needs, such as  hemophilia or other clotting disorders. BEFORE THE TRANSFUSION  Who gives blood for transfusions?   Healthy volunteers who are fully evaluated to make sure their blood is safe. This is blood bank blood. Transfusion therapy is the safest it has ever been in the practice of medicine. Before blood is taken from a donor, a complete history is taken to make sure that person has no history of diseases nor engages in risky social behavior (examples are intravenous drug use or sexual activity with multiple partners). The donor's travel history is screened to minimize risk of transmitting infections, such as malaria. The donated blood is tested for signs of infectious diseases, such as HIV and hepatitis. The blood is then tested to be sure it is compatible with you in order to minimize the chance of a transfusion reaction. If you or a relative donates blood, this is often done in anticipation of surgery and is not appropriate for emergency situations. It takes many days to process the donated blood. RISKS AND COMPLICATIONS Although transfusion therapy is very safe and saves many lives, the main dangers of transfusion include:   Getting an infectious disease.  Developing a transfusion reaction. This is an allergic reaction to something in the blood you were given. Every precaution is taken to prevent this. The decision to have  a blood transfusion has been considered carefully by your caregiver before blood is given. Blood is not given unless the benefits outweigh the risks. AFTER THE TRANSFUSION  Right after receiving a blood transfusion, you will usually feel much better and more energetic. This is especially true if your red blood cells have gotten low (anemic). The transfusion raises the level of the red blood cells which carry oxygen, and this usually causes an energy increase.  The nurse administering the transfusion will monitor you carefully for complications. HOME CARE INSTRUCTIONS  No special  instructions are needed after a transfusion. You may find your energy is better. Speak with your caregiver about any limitations on activity for underlying diseases you may have. SEEK MEDICAL CARE IF:   Your condition is not improving after your transfusion.  You develop redness or irritation at the intravenous (IV) site. SEEK IMMEDIATE MEDICAL CARE IF:  Any of the following symptoms occur over the next 12 hours:  Shaking chills.  You have a temperature by mouth above 102 F (38.9 C), not controlled by medicine.  Chest, back, or muscle pain.  People around you feel you are not acting correctly or are confused.  Shortness of breath or difficulty breathing.  Dizziness and fainting.  You get a rash or develop hives.  You have a decrease in urine output.  Your urine turns a dark color or changes to pink, red, or brown. Any of the following symptoms occur over the next 10 days:  You have a temperature by mouth above 102 F (38.9 C), not controlled by medicine.  Shortness of breath.  Weakness after normal activity.  The white part of the eye turns yellow (jaundice).  You have a decrease in the amount of urine or are urinating less often.  Your urine turns a dark color or changes to pink, red, or brown. Document Released: 03/27/2000 Document Revised: 06/22/2011 Document Reviewed: 11/14/2007 [facility] Patient Information 2014 [place], [place].  _______________________________________________________________________

## 2015-06-04 NOTE — Pre-Procedure Instructions (Signed)
Clearance note Dr. Charlett Blake 05-24-15 with chart.

## 2015-06-12 ENCOUNTER — Inpatient Hospital Stay (HOSPITAL_COMMUNITY): Payer: Medicare Other | Admitting: Anesthesiology

## 2015-06-12 ENCOUNTER — Encounter (HOSPITAL_COMMUNITY): Payer: Self-pay

## 2015-06-12 ENCOUNTER — Encounter (HOSPITAL_COMMUNITY)
Admission: RE | Disposition: A | Discharge status: Home Health | Payer: Self-pay | Source: Ambulatory Visit | Attending: Orthopedic Surgery

## 2015-06-12 ENCOUNTER — Inpatient Hospital Stay (HOSPITAL_COMMUNITY)
Admission: RE | Admit: 2015-06-12 | Discharge: 2015-06-13 | DRG: 470 | Disposition: A | Discharge status: Home Health | Payer: Medicare Other | Source: Ambulatory Visit | Attending: Orthopedic Surgery | Admitting: Orthopedic Surgery

## 2015-06-12 ENCOUNTER — Inpatient Hospital Stay (HOSPITAL_COMMUNITY): Payer: Medicare Other

## 2015-06-12 DIAGNOSIS — M1611 Unilateral primary osteoarthritis, right hip: Principal | ICD-10-CM | POA: Diagnosis present

## 2015-06-12 DIAGNOSIS — Z96642 Presence of left artificial hip joint: Secondary | ICD-10-CM | POA: Diagnosis not present

## 2015-06-12 DIAGNOSIS — Z96641 Presence of right artificial hip joint: Secondary | ICD-10-CM | POA: Diagnosis not present

## 2015-06-12 DIAGNOSIS — Z471 Aftercare following joint replacement surgery: Secondary | ICD-10-CM | POA: Diagnosis not present

## 2015-06-12 DIAGNOSIS — M25551 Pain in right hip: Secondary | ICD-10-CM | POA: Diagnosis not present

## 2015-06-12 DIAGNOSIS — F329 Major depressive disorder, single episode, unspecified: Secondary | ICD-10-CM | POA: Diagnosis present

## 2015-06-12 DIAGNOSIS — Z79899 Other long term (current) drug therapy: Secondary | ICD-10-CM

## 2015-06-12 DIAGNOSIS — M169 Osteoarthritis of hip, unspecified: Secondary | ICD-10-CM | POA: Diagnosis present

## 2015-06-12 DIAGNOSIS — E039 Hypothyroidism, unspecified: Secondary | ICD-10-CM | POA: Diagnosis not present

## 2015-06-12 DIAGNOSIS — H9191 Unspecified hearing loss, right ear: Secondary | ICD-10-CM | POA: Diagnosis present

## 2015-06-12 DIAGNOSIS — Z96649 Presence of unspecified artificial hip joint: Secondary | ICD-10-CM

## 2015-06-12 DIAGNOSIS — Z01812 Encounter for preprocedural laboratory examination: Secondary | ICD-10-CM | POA: Diagnosis not present

## 2015-06-12 HISTORY — PX: TOTAL HIP ARTHROPLASTY: SHX124

## 2015-06-12 LAB — TYPE AND SCREEN
ABO/RH(D): AB POS
Antibody Screen: NEGATIVE

## 2015-06-12 SURGERY — ARTHROPLASTY, HIP, TOTAL, ANTERIOR APPROACH
Anesthesia: Spinal | Site: Hip | Laterality: Right

## 2015-06-12 MED ORDER — ONDANSETRON HCL 4 MG PO TABS: 4.0000 mg | ORAL_TABLET | Freq: Four times a day (QID) | ORAL | Status: DC | PRN

## 2015-06-12 MED ORDER — SODIUM CHLORIDE 0.9 % IV SOLN: INTRAVENOUS | Status: DC

## 2015-06-12 MED ORDER — HYDROMORPHONE HCL 1 MG/ML IJ SOLN: 0.2500 mg | INTRAMUSCULAR | Status: DC | PRN

## 2015-06-12 MED ORDER — METHOCARBAMOL 1000 MG/10ML IJ SOLN
500.0000 mg | Freq: Four times a day (QID) | INTRAVENOUS | Status: DC | PRN
  Administered 2015-06-12: 500 mg via INTRAVENOUS
  Filled 2015-06-12 (×2): qty 5

## 2015-06-12 MED ORDER — DEXAMETHASONE SODIUM PHOSPHATE 10 MG/ML IJ SOLN
INTRAMUSCULAR | Status: DC | PRN
  Administered 2015-06-12: 10 mg via INTRAVENOUS

## 2015-06-12 MED ORDER — METOCLOPRAMIDE HCL 10 MG PO TABS: 5.0000 mg | ORAL_TABLET | Freq: Three times a day (TID) | ORAL | Status: DC | PRN

## 2015-06-12 MED ORDER — SODIUM CHLORIDE 0.9 % IV SOLN
INTRAVENOUS | Status: DC
  Administered 2015-06-12: 75 mL/h via INTRAVENOUS

## 2015-06-12 MED ORDER — DOCUSATE SODIUM 100 MG PO CAPS
100.0000 mg | ORAL_CAPSULE | Freq: Two times a day (BID) | ORAL | Status: DC
  Administered 2015-06-12 – 2015-06-13 (×2): 100 mg via ORAL

## 2015-06-12 MED ORDER — METHOCARBAMOL 500 MG PO TABS: 500.0000 mg | ORAL_TABLET | Freq: Four times a day (QID) | ORAL | Status: DC | PRN

## 2015-06-12 MED ORDER — DULOXETINE HCL 20 MG PO CPEP
20.0000 mg | ORAL_CAPSULE | Freq: Every day | ORAL | Status: DC
  Administered 2015-06-13: 20 mg via ORAL
  Filled 2015-06-12: qty 1

## 2015-06-12 MED ORDER — LEVOTHYROXINE SODIUM 50 MCG PO TABS
50.0000 ug | ORAL_TABLET | Freq: Every day | ORAL | Status: DC
  Administered 2015-06-13: 50 ug via ORAL
  Filled 2015-06-12 (×2): qty 1

## 2015-06-12 MED ORDER — ONDANSETRON HCL 4 MG/2ML IJ SOLN: 4.0000 mg | Freq: Four times a day (QID) | INTRAMUSCULAR | Status: DC | PRN

## 2015-06-12 MED ORDER — ACETAMINOPHEN 325 MG PO TABS: 650.0000 mg | ORAL_TABLET | Freq: Four times a day (QID) | ORAL | Status: DC | PRN

## 2015-06-12 MED ORDER — CEFAZOLIN SODIUM-DEXTROSE 2-3 GM-% IV SOLR
2.0000 g | INTRAVENOUS | Status: AC
  Administered 2015-06-12: 2 g via INTRAVENOUS

## 2015-06-12 MED ORDER — DEXAMETHASONE SODIUM PHOSPHATE 10 MG/ML IJ SOLN: 10.0000 mg | Freq: Once | INTRAMUSCULAR | Status: DC

## 2015-06-12 MED ORDER — MEPERIDINE HCL 50 MG/ML IJ SOLN: 6.2500 mg | INTRAMUSCULAR | Status: DC | PRN

## 2015-06-12 MED ORDER — MIDAZOLAM HCL 2 MG/2ML IJ SOLN: 0.5000 mg | Freq: Once | INTRAMUSCULAR | Status: DC | PRN

## 2015-06-12 MED ORDER — ACETAMINOPHEN 500 MG PO TABS
1000.0000 mg | ORAL_TABLET | Freq: Four times a day (QID) | ORAL | Status: AC
  Administered 2015-06-12 – 2015-06-13 (×4): 1000 mg via ORAL
  Filled 2015-06-12 (×4): qty 2

## 2015-06-12 MED ORDER — FENTANYL CITRATE (PF) 100 MCG/2ML IJ SOLN
INTRAMUSCULAR | Status: AC
  Filled 2015-06-12: qty 2

## 2015-06-12 MED ORDER — LORATADINE 10 MG PO TABS
10.0000 mg | ORAL_TABLET | Freq: Every day | ORAL | Status: DC
  Administered 2015-06-13: 10 mg via ORAL
  Filled 2015-06-12: qty 1

## 2015-06-12 MED ORDER — TRANEXAMIC ACID 1000 MG/10ML IV SOLN
1000.0000 mg | INTRAVENOUS | Status: AC
  Administered 2015-06-12: 1000 mg via INTRAVENOUS
  Filled 2015-06-12: qty 10

## 2015-06-12 MED ORDER — BUPIVACAINE HCL (PF) 0.25 % IJ SOLN
INTRAMUSCULAR | Status: DC | PRN
  Administered 2015-06-12: 30 mL

## 2015-06-12 MED ORDER — ACETAMINOPHEN 10 MG/ML IV SOLN
INTRAVENOUS | Status: AC
  Filled 2015-06-12: qty 100

## 2015-06-12 MED ORDER — LACTATED RINGERS IV SOLN
INTRAVENOUS | Status: DC | PRN
  Administered 2015-06-12 (×3): via INTRAVENOUS

## 2015-06-12 MED ORDER — TRANEXAMIC ACID 1000 MG/10ML IV SOLN
1000.0000 mg | Freq: Once | INTRAVENOUS | Status: AC
  Administered 2015-06-12: 1000 mg via INTRAVENOUS
  Filled 2015-06-12: qty 10

## 2015-06-12 MED ORDER — PROPOFOL 10 MG/ML IV BOLUS
INTRAVENOUS | Status: AC
  Filled 2015-06-12: qty 60

## 2015-06-12 MED ORDER — CEFAZOLIN SODIUM-DEXTROSE 2-3 GM-% IV SOLR
INTRAVENOUS | Status: AC
Start: 2015-06-12 — End: 2015-06-12
  Filled 2015-06-12: qty 50

## 2015-06-12 MED ORDER — MORPHINE SULFATE (PF) 2 MG/ML IV SOLN
1.0000 mg | INTRAVENOUS | Status: DC | PRN
  Administered 2015-06-12 (×2): 1 mg via INTRAVENOUS
  Filled 2015-06-12 (×2): qty 1

## 2015-06-12 MED ORDER — METOCLOPRAMIDE HCL 5 MG/ML IJ SOLN: 5.0000 mg | Freq: Three times a day (TID) | INTRAMUSCULAR | Status: DC | PRN

## 2015-06-12 MED ORDER — BISACODYL 10 MG RE SUPP: 10.0000 mg | Freq: Every day | RECTAL | Status: DC | PRN

## 2015-06-12 MED ORDER — ACETAMINOPHEN 650 MG RE SUPP: 650.0000 mg | Freq: Four times a day (QID) | RECTAL | Status: DC | PRN

## 2015-06-12 MED ORDER — ONDANSETRON HCL 4 MG/2ML IJ SOLN
INTRAMUSCULAR | Status: DC | PRN
  Administered 2015-06-12: 4 mg via INTRAVENOUS

## 2015-06-12 MED ORDER — RIVAROXABAN 10 MG PO TABS
10.0000 mg | ORAL_TABLET | Freq: Every day | ORAL | Status: DC
  Administered 2015-06-13: 10 mg via ORAL
  Filled 2015-06-12 (×2): qty 1

## 2015-06-12 MED ORDER — BUPIVACAINE IN DEXTROSE 0.75-8.25 % IT SOLN
INTRATHECAL | Status: DC | PRN
  Administered 2015-06-12: 1.8 mL via INTRATHECAL

## 2015-06-12 MED ORDER — DEXAMETHASONE SODIUM PHOSPHATE 10 MG/ML IJ SOLN
INTRAMUSCULAR | Status: AC
  Filled 2015-06-12: qty 1

## 2015-06-12 MED ORDER — ONDANSETRON HCL 4 MG/2ML IJ SOLN
INTRAMUSCULAR | Status: AC
  Filled 2015-06-12: qty 2

## 2015-06-12 MED ORDER — CEFAZOLIN SODIUM-DEXTROSE 2-3 GM-% IV SOLR
2.0000 g | Freq: Four times a day (QID) | INTRAVENOUS | Status: AC
  Administered 2015-06-12 – 2015-06-13 (×2): 2 g via INTRAVENOUS
  Filled 2015-06-12 (×2): qty 50

## 2015-06-12 MED ORDER — DEXAMETHASONE SODIUM PHOSPHATE 10 MG/ML IJ SOLN
10.0000 mg | Freq: Once | INTRAMUSCULAR | Status: AC
  Administered 2015-06-13: 10 mg via INTRAVENOUS
  Filled 2015-06-12: qty 1

## 2015-06-12 MED ORDER — PROPOFOL 10 MG/ML IV BOLUS
INTRAVENOUS | Status: DC | PRN
  Administered 2015-06-12: 20 mg via INTRAVENOUS

## 2015-06-12 MED ORDER — MENTHOL 3 MG MT LOZG: 1.0000 | LOZENGE | OROMUCOSAL | Status: DC | PRN

## 2015-06-12 MED ORDER — FENTANYL CITRATE (PF) 100 MCG/2ML IJ SOLN
INTRAMUSCULAR | Status: DC | PRN
  Administered 2015-06-12: 50 ug via INTRAVENOUS

## 2015-06-12 MED ORDER — BUPIVACAINE HCL (PF) 0.25 % IJ SOLN
INTRAMUSCULAR | Status: AC
  Filled 2015-06-12: qty 30

## 2015-06-12 MED ORDER — OXYCODONE HCL 5 MG PO TABS
5.0000 mg | ORAL_TABLET | ORAL | Status: DC | PRN
  Administered 2015-06-12: 5 mg via ORAL
  Administered 2015-06-12 – 2015-06-13 (×4): 10 mg via ORAL
  Filled 2015-06-12 (×4): qty 2
  Filled 2015-06-12: qty 1

## 2015-06-12 MED ORDER — MIDAZOLAM HCL 5 MG/5ML IJ SOLN
INTRAMUSCULAR | Status: DC | PRN
  Administered 2015-06-12: 2 mg via INTRAVENOUS

## 2015-06-12 MED ORDER — DIPHENHYDRAMINE HCL 12.5 MG/5ML PO ELIX: 12.5000 mg | ORAL_SOLUTION | ORAL | Status: DC | PRN

## 2015-06-12 MED ORDER — 0.9 % SODIUM CHLORIDE (POUR BTL) OPTIME
TOPICAL | Status: DC | PRN
  Administered 2015-06-12: 1000 mL

## 2015-06-12 MED ORDER — MIDAZOLAM HCL 2 MG/2ML IJ SOLN
INTRAMUSCULAR | Status: AC
  Filled 2015-06-12: qty 2

## 2015-06-12 MED ORDER — FUROSEMIDE 20 MG PO TABS
20.0000 mg | ORAL_TABLET | Freq: Every day | ORAL | Status: DC
  Administered 2015-06-13: 20 mg via ORAL
  Filled 2015-06-12: qty 1

## 2015-06-12 MED ORDER — ACETAMINOPHEN 10 MG/ML IV SOLN
1000.0000 mg | Freq: Once | INTRAVENOUS | Status: AC
  Administered 2015-06-12: 1000 mg via INTRAVENOUS

## 2015-06-12 MED ORDER — PROPOFOL 500 MG/50ML IV EMUL
INTRAVENOUS | Status: DC | PRN
  Administered 2015-06-12: 85 ug/kg/min via INTRAVENOUS
  Administered 2015-06-12: 65 ug/kg/min via INTRAVENOUS

## 2015-06-12 MED ORDER — PHENOL 1.4 % MT LIQD: 1.0000 | OROMUCOSAL | Status: DC | PRN

## 2015-06-12 MED ORDER — POLYETHYLENE GLYCOL 3350 17 G PO PACK
17.0000 g | PACK | Freq: Every day | ORAL | Status: DC | PRN
Start: 2015-06-12 — End: 2015-06-13

## 2015-06-12 MED ORDER — STERILE WATER FOR IRRIGATION IR SOLN
Status: DC | PRN
Start: 2015-06-12 — End: 2015-06-12
  Administered 2015-06-12: 2000 mL

## 2015-06-12 MED ORDER — LIOTHYRONINE SODIUM 25 MCG PO TABS
25.0000 ug | ORAL_TABLET | Freq: Every day | ORAL | Status: DC
  Administered 2015-06-13: 25 ug via ORAL
  Filled 2015-06-12: qty 1

## 2015-06-12 MED ORDER — PROMETHAZINE HCL 25 MG/ML IJ SOLN: 6.2500 mg | INTRAMUSCULAR | Status: DC | PRN

## 2015-06-12 MED ORDER — GABAPENTIN 300 MG PO CAPS
300.0000 mg | ORAL_CAPSULE | Freq: Every day | ORAL | Status: DC
  Administered 2015-06-12: 300 mg via ORAL
  Filled 2015-06-12 (×2): qty 1

## 2015-06-12 MED ORDER — CHLORHEXIDINE GLUCONATE 4 % EX LIQD: 60.0000 mL | Freq: Once | CUTANEOUS | Status: DC

## 2015-06-12 MED ORDER — FLEET ENEMA 7-19 GM/118ML RE ENEM: 1.0000 | ENEMA | Freq: Once | RECTAL | Status: DC | PRN

## 2015-06-12 SURGICAL SUPPLY — 35 items
BLADE SAG 18X100X1.27 (BLADE) ×3 IMPLANT
CAPT HIP TOTAL 2 ×2 IMPLANT
CLOSURE WOUND 1/2 X4 (GAUZE/BANDAGES/DRESSINGS) ×2
CLOTH BEACON ORANGE TIMEOUT ST (SAFETY) ×3 IMPLANT
COVER PERINEAL POST (MISCELLANEOUS) ×3 IMPLANT
DRAPE STERI IOBAN 125X83 (DRAPES) ×3 IMPLANT
DRAPE U-SHAPE 47X51 STRL (DRAPES) ×6 IMPLANT
DRSG ADAPTIC 3X8 NADH LF (GAUZE/BANDAGES/DRESSINGS) ×3 IMPLANT
DRSG MEPILEX BORDER 4X4 (GAUZE/BANDAGES/DRESSINGS) ×3 IMPLANT
DRSG MEPILEX BORDER 4X8 (GAUZE/BANDAGES/DRESSINGS) ×3 IMPLANT
DURAPREP 26ML APPLICATOR (WOUND CARE) ×3 IMPLANT
ELECT REM PT RETURN 9FT ADLT (ELECTROSURGICAL) ×3
ELECTRODE REM PT RTRN 9FT ADLT (ELECTROSURGICAL) ×1 IMPLANT
EVACUATOR 1/8 PVC DRAIN (DRAIN) ×3 IMPLANT
GLOVE BIO SURGEON STRL SZ7.5 (GLOVE) ×7 IMPLANT
GLOVE BIO SURGEON STRL SZ8 (GLOVE) ×6 IMPLANT
GLOVE BIOGEL PI IND STRL 6.5 (GLOVE) IMPLANT
GLOVE BIOGEL PI IND STRL 7.5 (GLOVE) IMPLANT
GLOVE BIOGEL PI IND STRL 8 (GLOVE) ×2 IMPLANT
GLOVE BIOGEL PI INDICATOR 6.5 (GLOVE) ×2
GLOVE BIOGEL PI INDICATOR 7.5 (GLOVE) ×8
GLOVE BIOGEL PI INDICATOR 8 (GLOVE) ×4
GLOVE SURG SS PI 6.5 STRL IVOR (GLOVE) ×2 IMPLANT
GLOVE SURG SS PI 7.5 STRL IVOR (GLOVE) ×2 IMPLANT
GOWN STRL REUS W/TWL LRG LVL3 (GOWN DISPOSABLE) ×7 IMPLANT
GOWN STRL REUS W/TWL XL LVL3 (GOWN DISPOSABLE) ×5 IMPLANT
PACK ANTERIOR HIP CUSTOM (KITS) ×3 IMPLANT
STRIP CLOSURE SKIN 1/2X4 (GAUZE/BANDAGES/DRESSINGS) ×3 IMPLANT
SUT ETHIBOND NAB CT1 #1 30IN (SUTURE) ×3 IMPLANT
SUT MNCRL AB 4-0 PS2 18 (SUTURE) ×3 IMPLANT
SUT VIC AB 2-0 CT1 27 (SUTURE) ×6
SUT VIC AB 2-0 CT1 TAPERPNT 27 (SUTURE) ×2 IMPLANT
SUT VLOC 180 0 24IN GS25 (SUTURE) ×3 IMPLANT
TRAY FOLEY CATH SILVER 14FR (SET/KITS/TRAYS/PACK) ×2 IMPLANT
YANKAUER SUCT BULB TIP 10FT TU (MISCELLANEOUS) ×3 IMPLANT

## 2015-06-12 NOTE — Transfer of Care (Signed)
Immediate Anesthesia Transfer of Care Note  Patient: Misty Barker  Procedure(s) Performed: Procedure(s): RIGHT TOTAL HIP ARTHROPLASTY ANTERIOR APPROACH (Right)  Patient Location: PACU  Anesthesia Type:Spinal  Level of Consciousness:  sedated, patient cooperative and responds to stimulation  Airway & Oxygen Therapy:Patient Spontanous Breathing and Patient connected to face mask oxgen  Post-op Assessment:  Report given to PACU RN and Post -op Vital signs reviewed and stable  Post vital signs:  Reviewed and stable  Last Vitals:  Filed Vitals:   06/12/15 0836  BP: 142/57  Pulse: 67  Temp: 37 C  Resp: 16    Complications: No apparent anesthesia complications

## 2015-06-12 NOTE — Anesthesia Postprocedure Evaluation (Signed)
Anesthesia Post Note  Patient: Misty Barker  Procedure(s) Performed: Procedure(s) (LRB): RIGHT TOTAL HIP ARTHROPLASTY ANTERIOR APPROACH (Right)  Patient location during evaluation: PACU Anesthesia Type: Spinal Level of consciousness: awake and alert, oriented and patient cooperative Pain management: pain level controlled Vital Signs Assessment: post-procedure vital signs reviewed and stable Respiratory status: spontaneous breathing, nonlabored ventilation and respiratory function stable Cardiovascular status: blood pressure returned to baseline and stable Postop Assessment: no headache, no backache, spinal receding, patient able to bend at knees and no signs of nausea or vomiting Anesthetic complications: no    Last Vitals:  Filed Vitals:   06/12/15 1415 06/12/15 1430  BP: 136/74 132/74  Pulse: 66 68  Temp:  36.7 C  Resp: 14 14    Last Pain:  Filed Vitals:   06/12/15 1437  PainSc: 0-No pain                 Evonna Stoltz,E. Llewyn Heap

## 2015-06-12 NOTE — Progress Notes (Signed)
Utilization review completed.  

## 2015-06-12 NOTE — Anesthesia Procedure Notes (Addendum)
Spinal Patient location during procedure: OR End time: 06/12/2015 10:51 AM Staffing Resident/CRNA: Enrigue Catena E Performed by: resident/CRNA  Preanesthetic Checklist Completed: patient identified, site marked, surgical consent, pre-op evaluation, timeout performed, IV checked, risks and benefits discussed and monitors and equipment checked Spinal Block Patient position: sitting Prep: Betadine Patient monitoring: heart rate, continuous pulse ox and blood pressure Approach: right paramedian Injection technique: single-shot Needle Needle type: Spinocan  Needle gauge: 22 G Needle length: 9 cm Assessment Sensory level: T6 Additional Notes Expiration date of kit checked and confirmed. Patient tolerated procedure well, without complications.        Pt with          Scoliosis and kyphosis. CSF x 3.Marland Kitchen

## 2015-06-12 NOTE — Anesthesia Preprocedure Evaluation (Addendum)
Anesthesia Evaluation  Patient identified by MRN, date of birth, ID band Patient awake    Reviewed: Allergy & Precautions, NPO status , Patient's Chart, lab work & pertinent test results  History of Anesthesia Complications (+) PONV and history of anesthetic complications  Airway Mallampati: II  TM Distance: >3 FB Neck ROM: Full    Dental  (+) Dental Advisory Given, Teeth Intact   Pulmonary neg pulmonary ROS,    breath sounds clear to auscultation       Cardiovascular (-) anginanegative cardio ROS   Rhythm:Regular Rate:Normal     Neuro/Psych  Headaches, Depression    GI/Hepatic negative GI ROS, Neg liver ROS,   Endo/Other  Hypothyroidism   Renal/GU negative Renal ROS     Musculoskeletal  (+) Arthritis , Osteoarthritis,    Abdominal   Peds  Hematology negative hematology ROS (+)   Anesthesia Other Findings   Reproductive/Obstetrics                            Anesthesia Physical Anesthesia Plan  ASA: II  Anesthesia Plan: Spinal   Post-op Pain Management:    Induction:   Airway Management Planned: Simple Face Mask and Natural Airway  Additional Equipment:   Intra-op Plan:   Post-operative Plan:   Informed Consent: I have reviewed the patients History and Physical, chart, labs and discussed the procedure including the risks, benefits and alternatives for the proposed anesthesia with the patient or authorized representative who has indicated his/her understanding and acceptance.   Dental advisory given  Plan Discussed with: CRNA and Surgeon  Anesthesia Plan Comments: (Plan routine monitors, SAB)        Anesthesia Quick Evaluation

## 2015-06-12 NOTE — Op Note (Signed)
OPERATIVE REPORT  PREOPERATIVE DIAGNOSIS: Osteoarthritis of the Right hip.   POSTOPERATIVE DIAGNOSIS: Osteoarthritis of the Right  hip.   PROCEDURE: Right total hip arthroplasty, anterior approach.   SURGEON: Gaynelle Arabian, MD   ASSISTANT: Arlee Muslim, PA-C  ANESTHESIA:  Spinal  ESTIMATED BLOOD LOSS:-200 ml   DRAINS: Hemovac x1.   COMPLICATIONS: None   CONDITION: PACU - hemodynamically stable.   BRIEF CLINICAL NOTE: Misty Barker is a 70 y.o. female who has advanced end-  stage arthritis of her Right  hip with progressively worsening pain and  dysfunction.The patient has failed nonoperative management and presents for  total hip arthroplasty.   PROCEDURE IN DETAIL: After successful administration of spinal  anesthetic, the traction boots for the [facility] bed were placed on both  feet and the patient was placed onto the [facility] bed, boots placed into the leg  holders. The Right hip was then isolated from the perineum with plastic  drapes and prepped and draped in the usual sterile fashion. ASIS and  greater trochanter were marked and a oblique incision was made, starting  at about 1 cm lateral and 2 cm distal to the ASIS and coursing towards  the anterior cortex of the femur. The skin was cut with a 10 blade  through subcutaneous tissue to the level of the fascia overlying the  tensor fascia lata muscle. The fascia was then incised in line with the  incision at the junction of the anterior third and posterior 2/3rd. The  muscle was teased off the fascia and then the interval between the TFL  and the rectus was developed. The Hohmann retractor was then placed at  the top of the femoral neck over the capsule. The vessels overlying the  capsule were cauterized and the fat on top of the capsule was removed.  A Hohmann retractor was then placed anterior underneath the rectus  femoris to give exposure to the entire anterior capsule. A T-shaped  capsulotomy was  performed. The edges were tagged and the femoral head  was identified.       Osteophytes are removed off the superior acetabulum.  The femoral neck was then cut in situ with an oscillating saw. Traction  was then applied to the left lower extremity utilizing the [facility]  traction. The femoral head was then removed. Retractors were placed  around the acetabulum and then circumferential removal of the labrum was  performed. Osteophytes were also removed. Reaming starts at 43 mm to  medialize and  Increased in 2 mm increments to 47 mm. We reamed in  approximately 40 degrees of abduction, 20 degrees anteversion. A 48 mm  pinnacle acetabular shell was then impacted in anatomic position under  fluoroscopic guidance with excellent purchase. We did not need to place  any additional dome screws. A 28 mm neutral + 4 marathon liner was then  placed into the acetabular shell.       The femoral lift was then placed along the lateral aspect of the femur  just distal to the vastus ridge. The leg was  externally rotated and capsule  was stripped off the inferior aspect of the femoral neck down to the  level of the lesser trochanter, this was done with electrocautery. The femur was lifted after this was performed. The  leg was then placed and extended in adducted position to essentially delivering the femur. We also removed the capsule superiorly and the  piriformis from the piriformis  fossa to gain excellent exposure of the  proximal femur. Rongeur was used to remove some cancellous bone to get  into the lateral portion of the proximal femur for placement of the  initial starter reamer. The starter broaches was placed  the starter broach  and was shown to go down the center of the canal. Broaching  with the  Corail system was then performed starting at size 8, coursing  Up to size 14. A size 14 had excellent torsional and rotational  and axial stability. The trial high offset neck was then placed  with a 28 +  1.5 trial head. The hip was then reduced. We confirmed that  the stem was in the canal both on AP and lateral x-rays. It also has excellent sizing. The hip was reduced with outstanding stability through full extension, full external rotation,  and then flexion in adduction internal rotation. AP pelvis was taken  and the leg lengths were measured and found to be exactly equal. Hip  was then dislocated again and the femoral head and neck removed. The  femoral broach was removed. Size 14 Corail stem with a high offset  neck was then impacted into the femur following native anteversion. Has  excellent purchase in the canal. Excellent torsional and rotational and  axial stability. It is confirmed to be in the canal on AP and lateral  fluoroscopic views. The 28 + 1.5 ceramic head was placed and the hip  reduced with outstanding stability. Again AP pelvis was taken and it  confirmed that the leg lengths were equal. The wound was then copiously  irrigated with saline solution and the capsule reattached and repaired  with Ethibond suture.  30 mL of saline then additional 20 ml of .25% Bupivicaine injected into the capsule and into the edge of the tensor fascia lata as well as subcutaneous tissue. The fascia overlying the tensor fascia lata was  then closed with a running #1 V-Loc. Subcu was closed with interrupted  2-0 Vicryl and subcuticular running 4-0 Monocryl. Incision was cleaned  and dried. Steri-Strips and a bulky sterile dressing applied. Hemovac  drain was hooked to suction and then he was awakened and transported to  recovery in stable condition.        Please note that a surgical assistant was a medical necessity for this procedure to perform it in a safe and expeditious manner. Assistant was necessary to provide appropriate retraction of vital neurovascular structures and to prevent femoral fracture and allow for anatomic placement of the prosthesis.  Gaynelle Arabian, M.D.

## 2015-06-12 NOTE — Interval H&P Note (Signed)
History and Physical Interval Note:  06/12/2015 10:12 AM  Misty Barker  has presented today for surgery, with the diagnosis of right hip osteoarthritis  The various methods of treatment have been discussed with the patient and family. After consideration of risks, benefits and other options for treatment, the patient has consented to  Procedure(s): RIGHT TOTAL HIP ARTHROPLASTY ANTERIOR APPROACH (Right) as a surgical intervention .  The patient's history has been reviewed, patient examined, no change in status, stable for surgery.  I have reviewed the patient's chart and labs.  Questions were answered to the patient's satisfaction.     Gearlean Alf

## 2015-06-12 NOTE — Evaluation (Signed)
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  Pt should progress to dc home with family assist and HHPT follow up.    Follow Up Recommendations Home health PT    Equipment Recommendations  None recommended by PT    Recommendations for Other Services OT consult     Precautions / Restrictions Precautions Precautions: Fall Restrictions Weight Bearing Restrictions: No Other Position/Activity Restrictions: WBAT      Mobility  Bed Mobility Overal bed mobility: Needs Assistance Bed Mobility: Supine to Sit     Supine to sit: Min assist     General bed mobility comments: cues for sequence and use of L LE to self assist  Transfers Overall transfer level: Needs assistance Equipment used: Rolling walker (2 wheeled) Transfers: Sit to/from Stand Sit to Stand: Min assist         General transfer comment: cues for LE management and use of UEs to self assist  Ambulation/Gait Ambulation/Gait assistance: Min assist Ambulation Distance (Feet): 35 Feet Assistive device: Rolling walker (2 wheeled) Gait Pattern/deviations: Step-to pattern;Decreased step length - right;Decreased step length - left;Shuffle;Trunk flexed Gait velocity: decr Gait velocity interpretation: Below normal speed for age/gender General Gait Details: cues for sequence, posture and position from [employer]            Wheelchair Mobility    Modified Rankin (Stroke Patients Only)       Balance                                             Pertinent Vitals/Pain Pain Assessment: 0-10 Pain Score: 6  Pain Location: R hip Pain Descriptors / Indicators: Aching;Burning;Sore Pain Intervention(s):  Limited activity within patient's tolerance;Monitored during session;Premedicated before session;Ice applied;Patient requesting pain meds-RN notified    Home Living Family/patient expects to be discharged to:: Private residence Living Arrangements: Spouse/significant other Available Help at Discharge: Family Type of Home: House Home Access: Stairs to enter   Technical brewer of Steps: 1 Home Layout: One level [place]: Walker - 2 wheels      Prior Function Level of Independence: Independent;Independent with assistive device(s)               Hand Dominance        Extremity/Trunk Assessment   Upper Extremity Assessment: Overall WFL for tasks assessed           Lower Extremity Assessment: RLE deficits/detail         Communication   Communication: No difficulties  Cognition Arousal/Alertness: Awake/alert Behavior During Therapy: WFL for tasks assessed/performed Overall Cognitive Status: Within Functional Limits for tasks assessed                      General Comments      Exercises Total Joint Exercises Ankle Circles/Pumps: AROM;Both;15 reps;Supine      Assessment/Plan    PT Assessment Patient needs continued PT services  PT Diagnosis Difficulty walking   PT Problem List Decreased strength;Decreased range of motion;Decreased activity tolerance;Decreased mobility;Decreased knowledge of use of DME;Pain  PT Treatment Interventions DME instruction;Gait training;Stair training;Functional mobility training;Therapeutic activities;Therapeutic  exercise;Patient/family education   PT Goals (Current goals can be found in the Care Plan section) Acute Rehab PT Goals Patient Stated Goal: Resume previous lifestyle with decreased pain PT Goal Formulation: With patient Time For Goal Achievement: 06/15/15 Potential to Achieve Goals: Good    Frequency 7X/week   Barriers to discharge        Co-evaluation               End of Session  Equipment Utilized During Treatment: Gait belt Activity Tolerance: Patient tolerated treatment well Patient left: in chair;with call bell/phone within reach;with family/visitor present Nurse Communication: Mobility status         Time: 1510-1533 PT Time Calculation (min) (ACUTE ONLY): 23 min   Charges:   PT Evaluation $PT Eval Low Complexity: 1 Procedure PT Treatments $Gait Training: 8-22 mins   PT G Codes:        Misty Barker 2015/06/26, 5:43 PM

## 2015-06-13 LAB — CBC
HCT: 35.2 % — ABNORMAL LOW (ref 36.0–46.0)
Hemoglobin: 11.6 g/dL — ABNORMAL LOW (ref 12.0–15.0)
MCH: 27.7 pg (ref 26.0–34.0)
MCHC: 33 g/dL (ref 30.0–36.0)
MCV: 84 fL (ref 78.0–100.0)
PLATELETS: 224 10*3/uL (ref 150–400)
RBC: 4.19 MIL/uL (ref 3.87–5.11)
RDW: 13.3 % (ref 11.5–15.5)
WBC: 14.2 10*3/uL — ABNORMAL HIGH (ref 4.0–10.5)

## 2015-06-13 LAB — BASIC METABOLIC PANEL
Anion gap: 8 (ref 5–15)
BUN: 14 mg/dL (ref 6–20)
CHLORIDE: 107 mmol/L (ref 101–111)
CO2: 25 mmol/L (ref 22–32)
CREATININE: 0.7 mg/dL (ref 0.44–1.00)
Calcium: 9.3 mg/dL (ref 8.9–10.3)
GFR calc Af Amer: >60 mL/min (ref >60)
GLUCOSE: 119 mg/dL — AB (ref 65–99)
Potassium: 3.9 mmol/L (ref 3.5–5.1)
Sodium: 140 mmol/L (ref 135–145)

## 2015-06-13 MED ORDER — OXYCODONE HCL 5 MG PO TABS: 5.0000 mg | ORAL_TABLET | ORAL | Status: DC | PRN

## 2015-06-13 MED ORDER — RIVAROXABAN 10 MG PO TABS: 10.0000 mg | ORAL_TABLET | Freq: Every day | ORAL | Status: DC

## 2015-06-13 MED ORDER — METHOCARBAMOL 500 MG PO TABS: 500.0000 mg | ORAL_TABLET | Freq: Four times a day (QID) | ORAL | Status: DC | PRN

## 2015-06-13 NOTE — Discharge Instructions (Addendum)
° °Dr. Frank Aluisio °Total Joint Specialist °[facility] °[address] °[place], [place] [address] °[phone] ° °ANTERIOR APPROACH TOTAL HIP REPLACEMENT POSTOPERATIVE DIRECTIONS ° ° °Hip Rehabilitation, Guidelines Following Surgery  °The results of a hip operation are greatly improved after range of motion and muscle strengthening exercises. Follow all safety measures which are given to protect your hip. If any of these exercises cause increased pain or swelling in your joint, decrease the amount until you are comfortable again. Then slowly increase the exercises. Call your caregiver if you have problems or questions.  ° °HOME CARE INSTRUCTIONS  °Remove items at home which could result in a fall. This includes throw rugs or furniture in walking pathways.  °· ICE to the affected hip every three hours for 30 minutes at a time and then as needed for pain and swelling.  Continue to use ice on the hip for pain and swelling from surgery. You may notice swelling that will progress down to the foot and ankle.  This is normal after surgery.  Elevate the leg when you are not up walking on it.   °· Continue to use the breathing machine which will help keep your temperature down.  It is common for your temperature to cycle up and down following surgery, especially at night when you are not up moving around and exerting yourself.  The breathing machine keeps your lungs expanded and your temperature down. ° ° °DIET °You may resume your previous home diet once your are discharged from the hospital. ° °DRESSING / WOUND CARE / SHOWERING °You may shower 3 days after surgery, but keep the wounds dry during showering.  You may use an occlusive plastic wrap (Press'n Seal for example), NO SOAKING/SUBMERGING IN THE BATHTUB.  If the bandage gets wet, change with a clean dry gauze.  If the incision gets wet, pat the wound dry with a clean towel. °You may start showering once you are discharged home but do not  submerge the incision under water. Just pat the incision dry and apply a dry gauze dressing on daily. °Change the surgical dressing daily and reapply a dry dressing each time. ° °ACTIVITY °Walk with your walker as instructed. °Use walker as long as suggested by your caregivers. °Avoid periods of inactivity such as sitting longer than an hour when not asleep. This helps prevent blood clots.  °You may resume a sexual relationship in one month or when given the OK by your doctor.  °You may return to work once you are cleared by your doctor.  °Do not drive a car for 6 weeks or until released by you surgeon.  °Do not drive while taking narcotics. ° °WEIGHT BEARING °Weight bearing as tolerated with assist device (walker, cane, etc) as directed, use it as long as suggested by your surgeon or therapist, typically at least 4-6 weeks. ° °POSTOPERATIVE CONSTIPATION PROTOCOL °Constipation - defined medically as fewer than three stools per week and severe constipation as less than one stool per week. ° °One of the most common issues patients have following surgery is constipation.  Even if you have a regular bowel pattern at home, your normal regimen is likely to be disrupted due to multiple reasons following surgery.  Combination of anesthesia, postoperative narcotics, change in appetite and fluid intake all can affect your bowels.  In order to avoid complications following surgery, here are some recommendations in order to help you during your recovery period. ° °Colace (docusate) - Pick up an over-the-counter   form of Colace or another stool softener and take twice a day as long as you are requiring postoperative pain medications.  Take with a full glass of water daily.  If you experience loose stools or diarrhea, hold the colace until you stool forms back up.  If your symptoms do not get better within 1 week or if they get worse, check with your doctor. ° °Dulcolax (bisacodyl) - Pick up over-the-counter and take as directed  by the product packaging as needed to assist with the movement of your bowels.  Take with a full glass of water.  Use this product as needed if not relieved by Colace only.  ° °MiraLax (polyethylene glycol) - Pick up over-the-counter to have on hand.  MiraLax is a solution that will increase the amount of water in your bowels to assist with bowel movements.  Take as directed and can mix with a glass of water, juice, soda, coffee, or tea.  Take if you go more than two days without a movement. °Do not use MiraLax more than once per day. Call your doctor if you are still constipated or irregular after using this medication for 7 days in a row. ° °If you continue to have problems with postoperative constipation, please contact the office for further assistance and recommendations.  If you experience "the worst abdominal pain ever" or develop nausea or vomiting, please contact the office immediatly for further recommendations for treatment. ° °ITCHING ° If you experience itching with your medications, try taking only a single pain pill, or even half a pain pill at a time.  You can also use Benadryl over the counter for itching or also to help with sleep.  ° °TED HOSE STOCKINGS °Wear the elastic stockings on both legs for three weeks following surgery during the day but you may remove then at night for sleeping. ° °MEDICATIONS °See your medication summary on the “After Visit Summary” that the nursing staff will review with you prior to discharge.  You may have some home medications which will be placed on hold until you complete the course of blood thinner medication.  It is important for you to complete the blood thinner medication as prescribed by your surgeon.  Continue your approved medications as instructed at time of discharge. ° °PRECAUTIONS °If you experience chest pain or shortness of breath - call 911 immediately for transfer to the hospital emergency department.  °If you develop a fever greater that 101 F,  purulent drainage from wound, increased redness or drainage from wound, foul odor from the wound/dressing, or calf pain - CONTACT YOUR SURGEON.   °                                                °FOLLOW-UP APPOINTMENTS °Make sure you keep all of your appointments after your operation with your surgeon and caregivers. You should call the office at the above phone number and make an appointment for approximately two weeks after the date of your surgery or on the date instructed by your surgeon outlined in the "After Visit Summary". ° °RANGE OF MOTION AND STRENGTHENING EXERCISES  °These exercises are designed to help you keep full movement of your hip joint. Follow your caregiver's or physical therapist's instructions. Perform all exercises about fifteen times, three times per day or as directed. Exercise both hips, even if you   have had only one joint replacement. These exercises can be done on a training (exercise) mat, on the floor, on a table or on a bed. Use whatever works the best and is most comfortable for you. Use music or television while you are exercising so that the exercises are a pleasant break in your day. This will make your life better with the exercises acting as a break in routine you can look forward to.  °Lying on your back, slowly slide your foot toward your buttocks, raising your knee up off the floor. Then slowly slide your foot back down until your leg is straight again.  °Lying on your back spread your legs as far apart as you can without causing discomfort.  °Lying on your side, raise your upper leg and foot straight up from the floor as far as is comfortable. Slowly lower the leg and repeat.  °Lying on your back, tighten up the muscle in the front of your thigh (quadriceps muscles). You can do this by keeping your leg straight and trying to raise your heel off the floor. This helps strengthen the largest muscle supporting your knee.  °Lying on your back, tighten up the muscles of your  buttocks both with the legs straight and with the knee bent at a comfortable angle while keeping your heel on the floor.  ° °IF YOU ARE TRANSFERRED TO A SKILLED REHAB FACILITY °If the patient is transferred to a skilled rehab facility following release from the hospital, a list of the current medications will be sent to the facility for the patient to continue.  When discharged from the skilled rehab facility, please have the facility set up the patient's Home Health Physical Therapy prior to being released. Also, the skilled facility will be responsible for providing the patient with their medications at time of release from the facility to include their pain medication, the muscle relaxants, and their blood thinner medication. If the patient is still at the rehab facility at time of the two week follow up appointment, the skilled rehab facility will also need to assist the patient in arranging follow up appointment in our office and any transportation needs. ° °MAKE SURE YOU:  °Understand these instructions.  °Get help right away if you are not doing well or get worse.  ° ° °Pick up stool softner and laxative for home use following surgery while on pain medications. °Do not submerge incision under water. °Please use good hand washing techniques while changing dressing each day. °May shower starting three days after surgery. °Please use a clean towel to pat the incision dry following showers. °Continue to use ice for pain and swelling after surgery. °Do not use any lotions or creams on the incision until instructed by your surgeon. ° °Take Xarelto for two and a half more weeks, then discontinue Xarelto. °Once the patient has completed the blood thinner regimen, then take a Baby 81 mg Aspirin daily for three more weeks. ° °Information on my medicine - XARELTO® (Rivaroxaban) ° °This medication education was reviewed with me or my healthcare representative as part of my discharge preparation.  The pharmacist that  spoke with me during my hospital stay was:  Zeigler, Dustin George, RPH ° °Why was Xarelto® prescribed for you? °Xarelto® was prescribed for you to reduce the risk of blood clots forming after orthopedic surgery. The medical term for these abnormal blood clots is venous thromboembolism (VTE). ° °What do you need to know about xarelto® ? °Take your Xarelto® ONCE   DAILY at the same time every day. °You may take it either with or without food. ° °If you have difficulty swallowing the tablet whole, you may crush it and mix in applesauce just prior to taking your dose. ° °Take Xarelto® exactly as prescribed by your doctor and DO NOT stop taking Xarelto® without talking to the doctor who prescribed the medication.  Stopping without other VTE prevention medication to take the place of Xarelto® may increase your risk of developing a clot. ° °After discharge, you should have regular check-up appointments with your healthcare provider that is prescribing your Xarelto®.   ° °What do you do if you miss a dose? °If you miss a dose, take it as soon as you remember on the same day then continue your regularly scheduled once daily regimen the next day. Do not take two doses of Xarelto® on the same day.  ° °Important Safety Information °A possible side effect of Xarelto® is bleeding. You should call your healthcare provider right away if you experience any of the following: °? Bleeding from an injury or your nose that does not stop. °? Unusual colored urine (red or dark brown) or unusual colored stools (red or black). °? Unusual bruising for unknown reasons. °? A serious fall or if you hit your head (even if there is no bleeding). ° °Some medicines may interact with Xarelto® and might increase your risk of bleeding while on Xarelto®. To help avoid this, consult your healthcare provider or pharmacist prior to using any new prescription or non-prescription medications, including herbals, vitamins, non-steroidal anti-inflammatory drugs  (NSAIDs) and supplements. ° °This website has more information on Xarelto®: www.xarelto.com. ° ° ° ° °

## 2015-06-13 NOTE — Care Management Note (Signed)
Case Management Note  Patient Details  Name: Misty Barker MRN: [id number] Date of Birth: 03/30/1946  Subjective/Objective:  S/p Right total hip arthroplasty, anterior approach                  Action/Plan: Discharge planning, spoke with patient at bedside. Have chosen [facility] for [facility]. Contacted [facility] for referral. Needs 3-n-1, contacted [facility] to deliver to room  Expected Discharge Date:                  Expected Discharge Plan:  [place]  In-House Referral:  NA  Discharge planning Services  CM Consult  Post Acute Care Choice:  Durable Medical Equipment, Home Health Choice offered to:  Patient  DME Arranged:  3-N-1 DME Agency:  [facility]:  PT [place]:  [facility]  Status of Service:  Completed, signed off  Medicare Important Message Given:    Date Medicare IM Given:    Medicare IM give by:    Date Additional Medicare IM Given:    Additional Medicare Important Message give by:     If discussed at [place] of Stay Meetings, dates discussed:    Additional Comments:  Guadalupe Maple, RN 06/13/2015, 12:54 PM [phone]

## 2015-06-13 NOTE — Progress Notes (Signed)
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  Pt hopeful for return home this pm.  Follow Up Recommendations  Home health PT     Equipment Recommendations  None recommended by PT    Recommendations for Other Services OT consult     Precautions / Restrictions Precautions Precautions: Fall Restrictions Weight Bearing Restrictions: No Other Position/Activity Restrictions: WBAT    Mobility  Bed Mobility Overal bed mobility: Needs Assistance Bed Mobility: Sit to Supine     Supine to sit: Supervision Sit to supine: Min assist   General bed mobility comments: cues for sequence and use of L LE to self assist  Transfers Overall transfer level: Needs assistance Equipment used: Rolling walker (2 wheeled) Transfers: Sit to/from Stand Sit to Stand: Min assist;Min guard         General transfer comment: cues for LE management and use of UEs to self assist  Ambulation/Gait Ambulation/Gait assistance: Min assist;Min guard Ambulation Distance (Feet): 140 Feet Assistive device: Rolling walker (2 wheeled) Gait Pattern/deviations: Step-to pattern;Decreased step length - right;Decreased step length - left;Shuffle;Trunk flexed Gait velocity: decr Gait velocity interpretation: Below normal speed for age/gender General Gait Details: cues for sequence, posture and position from [employer]            Wheelchair Mobility    Modified Rankin (Stroke Patients Only)       Balance                                    Cognition Arousal/Alertness: Awake/alert Behavior During Therapy: WFL for tasks assessed/performed Overall Cognitive Status: Within Functional Limits for tasks assessed                      Exercises Total Joint  Exercises Ankle Circles/Pumps: AROM;Both;15 reps;Supine Quad Sets: AROM;Both;10 reps;Supine Heel Slides: AAROM;Right;20 reps;Supine Hip ABduction/ADduction: AAROM;Right;15 reps;Supine    General Comments        Pertinent Vitals/Pain Pain Assessment: 0-10 Pain Score: 4  Pain Location: R hip Pain Descriptors / Indicators: Aching;Sore Pain Intervention(s): Limited activity within patient's tolerance;Monitored during session;Premedicated before session;Ice applied    Home Living Family/patient expects to be discharged to:: Private residence Living Arrangements: Spouse/significant other Available Help at Discharge: Family Type of Home: House Home Access: Stairs to enter   Home Layout: One level Home Equipment: Environmental consultant - 2 wheels;Shower seat - built in      Prior Function Level of Independence: Independent;Independent with assistive device(s)          PT Goals (current goals can now be found in the care plan section) Acute Rehab PT Goals Patient Stated Goal: to go home today PT Goal Formulation: With patient Time For Goal Achievement: 06/15/15 Potential to Achieve Goals: Good Progress towards PT goals: Progressing toward goals    Frequency  7X/week    PT Plan Current plan remains appropriate    Co-evaluation             End of Session Equipment Utilized During Treatment: Gait belt Activity Tolerance: Patient tolerated treatment well Patient left: in bed;with call bell/phone within reach     Time: [id number] PT Time Calculation (min) (ACUTE ONLY): 33 min  Charges:  $Gait Training: 8-22 mins $Therapeutic Exercise: 8-22 mins                    G Codes:      Misty Barker 2015/07/08, 12:53 PM

## 2015-06-13 NOTE — Discharge Summary (Signed)
Physician Discharge Summary   [name] MRN: [id number] DOB/AGE: [birth year] 70 y.o.  Admit date: 06/12/2015 Discharge date: 06-13-2015  Primary Diagnosis:  Osteoarthritis of the Right hip.   Admission Diagnoses:  Past Medical History  Diagnosis Date  . History of chicken pox   . Allergy     "seasonal allergies"  . Depression   . Hyperlipidemia   . Thyroid disease   . Abdominal pain 02/01/2015  . Scoliosis 02/01/2015  . URI, acute 03/10/2015  . 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 Right hip pain 05/24/2015  . Complication of anesthesia   . PONV (postoperative nausea and vomiting)   . Hypothyroidism     supplement used  . Frequent headaches     no problems now.  . Peripheral neuropathy ([place]) 02/01/2015    right hand"carpal tunnel"  . 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 Arthritis     osteoarthritis-"DDD" hips. fingers, toes.   Discharge Diagnoses:   Principal Problem:   OA (osteoarthritis) of hip  Estimated body mass index is 18.14 kg/(m^2) as calculated from the following:   Height as of this encounter: 5' 3.25" (1.607 m).   Weight as of this encounter: 46.834 kg (103 lb 4 oz).  Procedure:  Procedure(s) (LRB): RIGHT TOTAL HIP ARTHROPLASTY ANTERIOR APPROACH (Right)   Consults: None  HPI: Misty Barker is a 70 y.o. female who has advanced end-  stage arthritis of her Right hip with progressively worsening pain and  dysfunction.The patient has failed nonoperative management and presents for  total hip arthroplasty.   Laboratory Data: Admission on 06/12/2015  Component Date Value Ref Range Status  . WBC 06/13/2015 14.2* 4.0 - 10.5 K/uL Final  . RBC 06/13/2015 4.19  3.87 - 5.11 MIL/uL Final  . Hemoglobin 06/13/2015 11.6* 12.0 - 15.0 g/dL Final  . HCT 06/13/2015 35.2* 36.0 - 46.0 % Final  . MCV 06/13/2015 84.0  78.0 - 100.0 fL Final  . MCH 06/13/2015 27.7  26.0 - 34.0 pg Final    . MCHC 06/13/2015 33.0  30.0 - 36.0 g/dL Final  . RDW 06/13/2015 13.3  11.5 - 15.5 % Final  . Platelets 06/13/2015 224  150 - 400 K/uL Final  . Sodium 06/13/2015 140  135 - 145 mmol/L Final  . Potassium 06/13/2015 3.9  3.5 - 5.1 mmol/L Final  . Chloride 06/13/2015 107  101 - 111 mmol/L Final  . CO2 06/13/2015 25  22 - 32 mmol/L Final  . Glucose, Bld 06/13/2015 119* 65 - 99 mg/dL Final  . BUN 06/13/2015 14  6 - 20 mg/dL Final  . Creatinine, Ser 06/13/2015 0.70  0.44 - 1.00 mg/dL Final  . Calcium 06/13/2015 9.3  8.9 - 10.3 mg/dL Final  . GFR calc non Af Amer 06/13/2015 >60  >60 mL/min Final  . GFR calc Af Amer 06/13/2015 >60  >60 mL/min Final   Comment: (NOTE) The eGFR has been calculated using the CKD EPI equation. This calculation has not been validated in all clinical situations. eGFR's persistently <60 mL/min signify possible Chronic Kidney Disease.   Georgiann Hahn gap 06/13/2015 8  5 - 15 Final  Hospital Outpatient Visit on 06/04/2015  Component Date Value Ref Range Status  . aPTT 06/04/2015 30  24 - 37 seconds Final  . WBC 06/04/2015 6.6  4.0 - 10.5 K/uL Final  . RBC 06/04/2015 4.75  3.87 -  5.11 MIL/uL Final  . Hemoglobin 06/04/2015 12.8  12.0 - 15.0 g/dL Final  . HCT 06/04/2015 41.1  36.0 - 46.0 % Final  . MCV 06/04/2015 86.5  78.0 - 100.0 fL Final  . MCH 06/04/2015 26.9  26.0 - 34.0 pg Final  . MCHC 06/04/2015 31.1  30.0 - 36.0 g/dL Final  . RDW 06/04/2015 13.8  11.5 - 15.5 % Final  . Platelets 06/04/2015 277  150 - 400 K/uL Final  . Sodium 06/04/2015 136  135 - 145 mmol/L Final  . Potassium 06/04/2015 3.7  3.5 - 5.1 mmol/L Final  . Chloride 06/04/2015 98* 101 - 111 mmol/L Final  . CO2 06/04/2015 30  22 - 32 mmol/L Final  . Glucose, Bld 06/04/2015 90  65 - 99 mg/dL Final  . BUN 06/04/2015 10  6 - 20 mg/dL Final  . Creatinine, Ser 06/04/2015 0.68  0.44 - 1.00 mg/dL Final  . Calcium 06/04/2015 9.9  8.9 - 10.3 mg/dL Final  . Total Protein 06/04/2015 7.0  6.5 - 8.1 g/dL Final   . Albumin 06/04/2015 4.3  3.5 - 5.0 g/dL Final  . AST 06/04/2015 27  15 - 41 U/L Final  . ALT 06/04/2015 18  14 - 54 U/L Final  . Alkaline Phosphatase 06/04/2015 53  38 - 126 U/L Final  . Total Bilirubin 06/04/2015 0.6  0.3 - 1.2 mg/dL Final  . GFR calc non Af Amer 06/04/2015 >60  >60 mL/min Final  . GFR calc Af Amer 06/04/2015 >60  >60 mL/min Final   Comment: (NOTE) The eGFR has been calculated using the CKD EPI equation. This calculation has not been validated in all clinical situations. eGFR's persistently <60 mL/min signify possible Chronic Kidney Disease.   . Anion gap 06/04/2015 8  5 - 15 Final  . Prothrombin Time 06/04/2015 13.4  11.6 - 15.2 seconds Final  . INR 06/04/2015 1.04  0.00 - 1.49 Final  . ABO/RH(D) 06/04/2015 AB POS   Final  . Antibody Screen 06/04/2015 NEG   Final  . Sample Expiration 06/04/2015 06/15/2015   Final  . Extend sample reason 06/04/2015 NO TRANSFUSIONS OR PREGNANCY IN THE PAST 3 MONTHS   Final  . Color, Urine 06/04/2015 YELLOW  YELLOW Final  . APPearance 06/04/2015 CLEAR  CLEAR Final  . Specific Gravity, Urine 06/04/2015 1.009  1.005 - 1.030 Final  . pH 06/04/2015 6.5  5.0 - 8.0 Final  . Glucose, UA 06/04/2015 NEGATIVE  NEGATIVE mg/dL Final  . Hgb urine dipstick 06/04/2015 NEGATIVE  NEGATIVE Final  . Bilirubin Urine 06/04/2015 NEGATIVE  NEGATIVE Final  . Ketones, ur 06/04/2015 NEGATIVE  NEGATIVE mg/dL Final  . Protein, ur 06/04/2015 NEGATIVE  NEGATIVE mg/dL Final  . Nitrite 06/04/2015 NEGATIVE  NEGATIVE Final  . Leukocytes, UA 06/04/2015 NEGATIVE  NEGATIVE Final   MICROSCOPIC NOT DONE ON URINES WITH NEGATIVE PROTEIN, BLOOD, LEUKOCYTES, NITRITE, OR GLUCOSE <1000 mg/dL.  Marland Kitchen MRSA, PCR 06/04/2015 NEGATIVE  NEGATIVE Final  . Staphylococcus aureus 06/04/2015 NEGATIVE  NEGATIVE Final   Comment:        The Xpert SA Assay (FDA approved for NASAL specimens in patients over 38 years of age), is one component of a comprehensive surveillance program.  Test  performance has been validated by [facility] : [facility] for patients greater than or equal to 96 year old. It is not intended to diagnose infection nor to guide or monitor treatment.   . ABO/RH(D) 06/04/2015 AB POS   Final  Lab on 04/25/2015  Component Date  Value Ref Range Status  . TSH 04/25/2015 0.02* 0.35 - 4.50 uIU/mL Final  . Free T4 04/25/2015 0.73  0.60 - 1.60 ng/dL Final  . T3, Free 04/25/2015 3.1  2.3 - 4.2 pg/mL Final     X-Rays:Dg Pelvis Portable  06/12/2015  CLINICAL DATA:  Status post right-sided total hip replacement EXAM: DG C-ARM 1-60 MIN-NO REPORT; PORTABLE PELVIS 1-2 VIEWS FLUOROSCOPY TIME:  0 minutes 11 second; no submitted fluoroscopic images. COMPARISON:  None. FINDINGS: There are total hip replacements bilaterally with prosthetic components appearing well-seated bilaterally. No acute fracture or dislocation. There is soft tissue air on the right as well as a surgical drain on the right. IMPRESSION: Total hip replacement prostheses appear well seated bilaterally. Drain on the right as well as soft tissue air, expected acute postoperative findings. No acute fracture or dislocation. Electronically Signed   By: Lowella Grip III M.D.   On: 06/12/2015 13:08   Dg C-arm 1-60 Min-no Report  06/12/2015  CLINICAL DATA: surgery C-ARM 1-60 MINUTES Fluoroscopy was utilized by the requesting physician.  No radiographic interpretation.    EKG:No orders found for this or any previous visit.   Hospital Course: Shaylan Tutton Barker is a 70 y.o. who was admitted to [facility]. They were brought to the operating room on 06/12/2015 and underwent Procedure(s): RIGHT TOTAL HIP ARTHROPLASTY ANTERIOR APPROACH.  Patient tolerated the procedure well and was later transferred to the recovery room and then to the orthopaedic floor for postoperative care.  They were given PO and IV analgesics for pain control following their surgery.  They were given 24 hours of postoperative antibiotics of    Anti-infectives    Start     Dose/Rate Route Frequency Ordered Stop   06/12/15 1800  ceFAZolin (ANCEF) IVPB 2 g/50 mL premix     2 g 100 mL/hr over 30 Minutes Intravenous Every 6 hours 06/12/15 1502 06/13/15 0059   06/12/15 0834  ceFAZolin (ANCEF) IVPB 2 g/50 mL premix     2 g 100 mL/hr over 30 Minutes Intravenous On call to O.R. 06/12/15 [id number] 06/12/15 1058     and started on DVT prophylaxis in the form of Xarelto.   PT and OT were ordered for total joint protocol.  Discharge planning consulted to help with postop disposition and equipment needs.  Patient had a tough night on the evening of surgery with no sleep however she did walked about 35 feet that afternoon.  They started to get up OOB with therapy again on day one. Hemovac drain was pulled without difficulty.Dressing was checked and was clean and dry. Patient was seen in rounds on day one and it was felt that as long as they did well that they would be ready to go home later that afternoon.  Arrangements were made for home.  Discharge home with home health Diet - Regular diet Follow up - in 2 weeks on Tuesday 06/25/2015 Activity - WBAT Disposition - Home Condition Upon Discharge - Good D/C Meds - See DC Summary DVT Prophylaxis - Xarelto  Discharge Instructions    Call MD / Call 911    Complete by:  As directed   If you experience chest pain or shortness of breath, CALL 911 and be transported to the hospital emergency room.  If you develope a fever above 101 F, pus (white drainage) or increased drainage or redness at the wound, or calf pain, call your surgeon's office.     Change dressing    Complete  by:  As directed   You may change your dressing dressing daily with sterile 4 x 4 inch gauze dressing and paper tape.  Do not submerge the incision under water.     Constipation Prevention    Complete by:  As directed   Drink plenty of fluids.  Prune juice may be helpful.  You may use a stool softener, such as Colace (over the counter)  100 mg twice a day.  Use MiraLax (over the counter) for constipation as needed.     Diet general    Complete by:  As directed      Discharge instructions    Complete by:  As directed   Pick up stool softner and laxative for home use following surgery while on pain medications. Do not submerge incision under water. May remove the surgical dressing tomorrow, Friday 06/14/2015, and then apply a dry gauze dressing daily. Please use good hand washing techniques while changing dressing each day. May shower starting three days after surgery starting Saturday 06/15/2015. Please use a clean towel to pat the incision dry following showers. Continue to use ice for pain and swelling after surgery. Do not use any lotions or creams on the incision until instructed by your surgeon.  Postoperative Constipation Protocol  Constipation - defined medically as fewer than three stools per week and severe constipation as less than one stool per week.  One of the most common issues patients have following surgery is constipation. Even if you have a regular bowel pattern at home, your normal regimen is likely to be disrupted due to multiple reasons following surgery. Combination of anesthesia, postoperative narcotics, change in appetite and fluid intake all can affect your bowels. In order to avoid complications following surgery, here are some recommendations in order to help you during your recovery period.  Colace (docusate) - Pick up an over-the-counter form of Colace or another stool softener and take twice a day as long as you are requiring postoperative pain medications. Take with a full glass of water daily. If you experience loose stools or diarrhea, hold the colace until you stool forms back up. If your symptoms do not get better within 1 week or if they get worse, check with your doctor.  Dulcolax (bisacodyl) - Pick up over-the-counter and take as directed by the product packaging as needed to assist with  the movement of your bowels. Take with a full glass of water. Use this product as needed if not relieved by Colace only.   MiraLax (polyethylene glycol) - Pick up over-the-counter to have on hand. MiraLax is a solution that will increase the amount of water in your bowels to assist with bowel movements. Take as directed and can mix with a glass of water, juice, soda, coffee, or tea. Take if you go more than two days without a movement. Do not use MiraLax more than once per day. Call your doctor if you are still constipated or irregular after using this medication for 7 days in a row.  If you continue to have problems with postoperative constipation, please contact the office for further assistance and recommendations. If you experience "the worst abdominal pain ever" or develop nausea or vomiting, please contact the office immediatly for further recommendations for treatment.  Take Xarelto for two and a half more weeks, then discontinue Xarelto. Once the patient has completed the blood thinner regimen, then take a Baby 81 mg Aspirin daily for three more weeks.     Do not sit on  low chairs, stoools or toilet seats, as it may be difficult to get up from low surfaces    Complete by:  As directed      Driving restrictions    Complete by:  As directed   No driving until released by the physician.     Increase activity slowly as tolerated    Complete by:  As directed      Lifting restrictions    Complete by:  As directed   No lifting until released by the physician.     Patient may shower    Complete by:  As directed   You may shower without a dressing once there is no drainage.  Do not wash over the wound.  If drainage remains, do not shower until drainage stops.     TED hose    Complete by:  As directed   Use stockings (TED hose) for 3 weeks on both leg(s).  You may remove them at night for sleeping.     Weight bearing as tolerated    Complete by:  As directed   Laterality:  right    Extremity:  Lower            Medication List    STOP taking these medications        b complex vitamins tablet     CALCIUM 1200 PO     ciprofloxacin 500 MG tablet  Commonly known as:  CIPRO     HYDROcodone-acetaminophen 7.5-325 MG tablet  Commonly known as:  NORCO     Vitamin D 2000 units Caps      TAKE these medications        cetirizine 10 MG tablet  Commonly known as:  ZYRTEC  Take 10 mg by mouth daily.     DULoxetine 20 MG capsule  Commonly known as:  CYMBALTA  Take 1 capsule (20 mg total) by mouth daily.     furosemide 20 MG tablet  Commonly known as:  LASIX  Take 1 tablet (20 mg total) by mouth daily.     gabapentin 300 MG capsule  Commonly known as:  NEURONTIN  Take 1 capsule (300 mg total) by mouth at bedtime.     levothyroxine 50 MCG tablet  Commonly known as:  SYNTHROID, LEVOTHROID  Take 1 tablet (50 mcg total) by mouth daily.     liothyronine 25 MCG tablet  Commonly known as:  CYTOMEL  Take 1 tablet (25 mcg total) by mouth daily.     methocarbamol 500 MG tablet  Commonly known as:  ROBAXIN  Take 1 tablet (500 mg total) by mouth every 6 (six) hours as needed for muscle spasms.     oxyCODONE 5 MG immediate release tablet  Commonly known as:  Oxy IR/ROXICODONE  Take 1-2 tablets (5-10 mg total) by mouth every 3 (three) hours as needed for moderate pain or severe pain.     rivaroxaban 10 MG Tabs tablet  Commonly known as:  XARELTO  Take 1 tablet (10 mg total) by mouth daily with breakfast. Take Xarelto for two and a half more weeks, then discontinue Xarelto. Once the patient has completed the blood thinner regimen, then take a Baby 81 mg Aspirin daily for three more weeks.           Follow-up Information    Follow up with Gearlean Alf, MD On 06/25/2015.   Specialty:  Orthopedic Surgery   Why:  Call office at [phone] to setup appointment on Tuesday 06/25/2015 with  Dr. Wynelle Link.   Contact information:   [address] [place] [address] [phone]       Signed: Arlee Muslim, PA-C Orthopaedic Surgery 06/13/2015, 8:47 AM

## 2015-06-13 NOTE — Progress Notes (Signed)
[facility]  Delivered commode to room  Misty Barker 06/13/2015, 2:39 PM

## 2015-06-13 NOTE — Progress Notes (Signed)
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  Reviewed stairs and car transfers.  Follow Up Recommendations  Home health PT     Equipment Recommendations  None recommended by PT    Recommendations for Other Services OT consult     Precautions / Restrictions Precautions Precautions: Fall Restrictions Weight Bearing Restrictions: No Other Position/Activity Restrictions: WBAT    Mobility  Bed Mobility Overal bed mobility: Needs Assistance Bed Mobility: Sit to Supine       Sit to supine: Min assist   General bed mobility comments: NT - Pt OOB and declines back to bed  Transfers Overall transfer level: Needs assistance Equipment used: Rolling walker (2 wheeled) Transfers: Sit to/from Stand Sit to Stand: Min guard;Supervision         General transfer comment: cues for LE management and use of UEs to self assist  Ambulation/Gait Ambulation/Gait assistance: Min guard;Supervision Ambulation Distance (Feet): 120 Feet Assistive device: Rolling walker (2 wheeled) Gait Pattern/deviations: Step-to pattern;Decreased step length - right;Decreased step length - left;Shuffle;Trunk flexed Gait velocity: decr Gait velocity interpretation: Below normal speed for age/gender General Gait Details: cues for sequence, posture and position from RW   Stairs Stairs: Yes Stairs assistance: Min assist Stair Management: No rails;Step to pattern;Forwards;With walker Number of Stairs: 2 General stair comments: single step twice with RW and cues for foot/RW placement  Wheelchair Mobility    Modified Rankin (Stroke Patients Only)       Balance                                    Cognition Arousal/Alertness: Awake/alert Behavior  During Therapy: WFL for tasks assessed/performed Overall Cognitive Status: Within Functional Limits for tasks assessed                      Exercises Total Joint Exercises Ankle Circles/Pumps: AROM;Both;15 reps;Supine Quad Sets: AROM;Both;10 reps;Supine Heel Slides: AAROM;Right;20 reps;Supine Hip ABduction/ADduction: AAROM;Right;15 reps;Supine    General Comments        Pertinent Vitals/Pain Pain Assessment: 0-10 Pain Score: 4  Pain Location: R hip Pain Descriptors / Indicators: Aching;Sore Pain Intervention(s): Limited activity within patient's tolerance;Monitored during session;Premedicated before session    Home Living                      Prior Function            PT Goals (current goals can now be found in the care plan section) Acute Rehab PT Goals Patient Stated Goal: to go home today PT Goal Formulation: With patient Time For Goal Achievement: 06/15/15 Potential to Achieve Goals: Good Progress towards PT goals: Progressing toward goals    Frequency  7X/week    PT Plan Current plan remains appropriate    Co-evaluation             End of Session Equipment Utilized During Treatment: Gait belt Activity Tolerance: Patient tolerated treatment well Patient left: in chair;with call bell/phone within reach     Time: 1345-1410 PT Time Calculation (min) (ACUTE ONLY): 25 min  Charges:  $Gait Training: 8-22 mins $Therapeutic Exercise: 8-22 mins $Therapeutic Activity: 8-22 mins  G Codes:      Misty Barker Jun 20, 2015, 3:15 PM

## 2015-06-13 NOTE — Progress Notes (Signed)
Subjective: 1 Day Post-Op Procedure(s) (LRB): RIGHT TOTAL HIP ARTHROPLASTY ANTERIOR APPROACH (Right) Patient reports pain as mild.   Patient seen in rounds with Dr. Wynelle Link. Already up in chair this morning.  Some pain last night and not much sleep. Patient is well, but has had some minor complaints of pain in the hip, thigh, and knee, requiring pain medications We will resume therapy today.  Walked 35 feet the day of surgery.  If they do well with therapy and meets all goals, then will allow home later this afternoon following therapy. Plan is to go Home after hospital stay.  Objective: Vital signs in last 24 hours: Temp:  [97.3 F (36.3 C)-98.4 F (36.9 C)] 98.1 F (36.7 C) (03/02 0517) Pulse Rate:  [60-68] 62 (03/02 0517) Resp:  [13-18] 17 (03/02 0517) BP: (111-145)/(43-78) 111/50 mmHg (03/02 0517) SpO2:  [94 %-100 %] 99 % (03/02 0517) Arterial Line BP: (128)/(72) 128/72 mmHg (03/01 1358) Weight:  [46.834 kg (103 lb 4 oz)] 46.834 kg (103 lb 4 oz) (03/01 0837)  Intake/Output from previous day:  Intake/Output Summary (Last 24 hours) at 06/13/15 0836 Last data filed at 06/13/15 0529  Gross per 24 hour  Intake   3500 ml  Output   2820 ml  Net    680 ml    Intake/Output this shift: UOP 1000 since around [place]  Labs:  Recent Labs  06/13/15 0407  HGB 11.6*    Recent Labs  06/13/15 0407  WBC 14.2*  RBC 4.19  HCT 35.2*  PLT 224    Recent Labs  06/13/15 0407  NA 140  K 3.9  CL 107  CO2 25  BUN 14  CREATININE 0.70  GLUCOSE 119*  CALCIUM 9.3   No results for input(s): LABPT, INR in the last 72 hours.  EXAM General - Patient is Alert, Appropriate and Oriented Extremity - Neurovascular intact Sensation intact distally Dorsiflexion/Plantar flexion intact Dressing - dressing C/D/I Motor Function - intact, moving foot and toes well on exam.  Hemovac pulled without difficulty.  Past Medical History  Diagnosis Date  . History of chicken pox   . Allergy       "seasonal allergies"  . Depression   . Hyperlipidemia   . Thyroid disease   . Abdominal pain 02/01/2015  . Scoliosis 02/01/2015  . URI, acute 03/10/2015  . 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 Right hip pain 05/24/2015  . Complication of anesthesia   . PONV (postoperative nausea and vomiting)   . Hypothyroidism     supplement used  . Frequent headaches     no problems now.  . Peripheral neuropathy ([place]) 02/01/2015    right hand"carpal tunnel"  . 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 Arthritis     osteoarthritis-"DDD" hips. fingers, toes.    Assessment/Plan: 1 Day Post-Op Procedure(s) (LRB): RIGHT TOTAL HIP ARTHROPLASTY ANTERIOR APPROACH (Right) Principal Problem:   OA (osteoarthritis) of hip  Estimated body mass index is 18.14 kg/(m^2) as calculated from the following:   Height as of this encounter: 5' 3.25" (1.607 m).   Weight as of this encounter: 46.834 kg (103 lb 4 oz). Up with therapy Discharge home with home health  DVT Prophylaxis - Xarelto Weight Bearing As Tolerated right Leg Hemovac Pulled Begin Therapy  If meets goals and able to go home: Advance diet Up with therapy Discharge home with home health Diet -  Regular diet Follow up - in 2 weeks on Tuesday 06/25/2015 Activity - WBAT Disposition - Home Condition Upon Discharge - Good D/C Meds - See DC Summary DVT Prophylaxis - Xarelto  Arlee Muslim, PA-C Orthopaedic Surgery 06/13/2015, 8:36 AM

## 2015-06-13 NOTE — Progress Notes (Signed)
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 No further OT needs identified; will sign off.    Follow Up Recommendations  No OT follow up;Supervision - Intermittent    Equipment Recommendations  3 in 1 bedside comode    Recommendations for Other Services       Precautions / Restrictions Precautions Precautions: Fall Restrictions Weight Bearing Restrictions: No Other Position/Activity Restrictions: WBAT      Mobility Bed Mobility Overal bed mobility: Needs Assistance Bed Mobility: Supine to Sit     Supine to sit: Supervision        Transfers Overall transfer level: Needs assistance Equipment used: Rolling walker (2 wheeled) Transfers: Sit to/from Stand Sit to Stand: Supervision              Balance                                            ADL Overall ADL's : Needs assistance/impaired Eating/Feeding: Independent;Bed level   Grooming: Wash/dry hands;Wash/dry face;Supervision/safety;Standing   Upper Body Bathing: Set up;Sitting   Lower Body Bathing: Minimal assistance;Sit to/from stand   Upper Body Dressing : Set up;Sitting   Lower Body Dressing: Minimal assistance;Sit to/from stand   Toilet Transfer: Supervision/safety;Ambulation;Regular Toilet;BSC;RW   Toileting- Clothing Manipulation and Hygiene: Supervision/safety;Sit to/from stand   Tub/ Shower Transfer: Walk-in shower;Supervision/safety;Ambulation;Rolling walker   Functional mobility during ADLs: Supervision/safety;Rolling walker General ADL Comments: Patient practiced toilet transfer/toileting, walk-in shower transfer, LB self-care during session. She requires min A LB self-care. Has reacher from previous hip surgery.  Will use that vs having spouse assist her. Recommend 3 in 1 for home use. No further OT needs. Will sign off.     Vision     Perception     Praxis      Pertinent Vitals/Pain Pain Assessment: 0-10 Pain Score: 4  Pain Location: R hip Pain Descriptors / Indicators: Aching;Sore Pain Intervention(s): Limited activity within patient's tolerance;Monitored during session;Repositioned;Ice applied     Hand Dominance Right   Extremity/Trunk Assessment Upper Extremity Assessment Upper Extremity Assessment: Overall WFL for tasks assessed   Lower Extremity Assessment Lower Extremity Assessment: Defer to PT evaluation   Cervical / Trunk Assessment Cervical / Trunk Assessment: Kyphotic   Communication Communication Communication: No difficulties   Cognition Arousal/Alertness: Awake/alert Behavior During Therapy: WFL for tasks assessed/performed Overall Cognitive Status: Within Functional Limits for tasks assessed                     General Comments       Exercises       Shoulder Instructions      Home Living Family/patient expects to be discharged to:: Private residence Living Arrangements: Spouse/significant other Available Help at Discharge: Family Type of Home: House Home Access: Stairs to enter Technical brewer of Steps: 1   Home Layout: One level     Bathroom Shower/Tub: Occupational psychologist: Standard Bathroom Accessibility: Yes How Accessible: Accessible via walker Home Equipment: [place] - 2 wheels;Shower seat - built in          Prior Functioning/Environment Level of Independence: Independent;Independent with assistive device(s)  OT Diagnosis: Acute pain   OT Problem List: Decreased range of motion;Decreased knowledge of use of DME or AE;Pain   OT Treatment/Interventions:      OT Goals(Current goals can be found in the care plan section) Acute Rehab OT Goals Patient Stated Goal: to go home today OT Goal  Formulation: All assessment and education complete, DC therapy  OT Frequency:     Barriers to D/C:            Co-evaluation              End of Session Equipment Utilized During Treatment: Rolling walker  Activity Tolerance: Patient tolerated treatment well Patient left: in bed;with call bell/phone within reach   Time: [id number] OT Time Calculation (min): 15 min Charges:  OT General Charges $OT Visit: 1 Procedure OT Evaluation $OT Eval Low Complexity: 1 Procedure G-Codes:    Paylin Hailu A 2015/06/28, 11:24 AM

## 2015-06-14 DIAGNOSIS — M519 Unspecified thoracic, thoracolumbar and lumbosacral intervertebral disc disorder: Secondary | ICD-10-CM | POA: Diagnosis not present

## 2015-06-14 DIAGNOSIS — Z96641 Presence of right artificial hip joint: Secondary | ICD-10-CM | POA: Diagnosis not present

## 2015-06-14 DIAGNOSIS — G8929 Other chronic pain: Secondary | ICD-10-CM | POA: Diagnosis not present

## 2015-06-14 DIAGNOSIS — F329 Major depressive disorder, single episode, unspecified: Secondary | ICD-10-CM | POA: Diagnosis not present

## 2015-06-14 DIAGNOSIS — Z471 Aftercare following joint replacement surgery: Secondary | ICD-10-CM | POA: Diagnosis not present

## 2015-06-14 DIAGNOSIS — M1990 Unspecified osteoarthritis, unspecified site: Secondary | ICD-10-CM | POA: Diagnosis not present

## 2015-06-17 DIAGNOSIS — G8929 Other chronic pain: Secondary | ICD-10-CM | POA: Diagnosis not present

## 2015-06-17 DIAGNOSIS — M1990 Unspecified osteoarthritis, unspecified site: Secondary | ICD-10-CM | POA: Diagnosis not present

## 2015-06-17 DIAGNOSIS — M519 Unspecified thoracic, thoracolumbar and lumbosacral intervertebral disc disorder: Secondary | ICD-10-CM | POA: Diagnosis not present

## 2015-06-17 DIAGNOSIS — Z471 Aftercare following joint replacement surgery: Secondary | ICD-10-CM | POA: Diagnosis not present

## 2015-06-17 DIAGNOSIS — F329 Major depressive disorder, single episode, unspecified: Secondary | ICD-10-CM | POA: Diagnosis not present

## 2015-06-17 DIAGNOSIS — Z96641 Presence of right artificial hip joint: Secondary | ICD-10-CM | POA: Diagnosis not present

## 2015-06-19 DIAGNOSIS — G8929 Other chronic pain: Secondary | ICD-10-CM | POA: Diagnosis not present

## 2015-06-19 DIAGNOSIS — Z471 Aftercare following joint replacement surgery: Secondary | ICD-10-CM | POA: Diagnosis not present

## 2015-06-19 DIAGNOSIS — F329 Major depressive disorder, single episode, unspecified: Secondary | ICD-10-CM | POA: Diagnosis not present

## 2015-06-19 DIAGNOSIS — M519 Unspecified thoracic, thoracolumbar and lumbosacral intervertebral disc disorder: Secondary | ICD-10-CM | POA: Diagnosis not present

## 2015-06-19 DIAGNOSIS — Z96641 Presence of right artificial hip joint: Secondary | ICD-10-CM | POA: Diagnosis not present

## 2015-06-19 DIAGNOSIS — M1990 Unspecified osteoarthritis, unspecified site: Secondary | ICD-10-CM | POA: Diagnosis not present

## 2015-06-21 DIAGNOSIS — F329 Major depressive disorder, single episode, unspecified: Secondary | ICD-10-CM | POA: Diagnosis not present

## 2015-06-21 DIAGNOSIS — M1990 Unspecified osteoarthritis, unspecified site: Secondary | ICD-10-CM | POA: Diagnosis not present

## 2015-06-21 DIAGNOSIS — M519 Unspecified thoracic, thoracolumbar and lumbosacral intervertebral disc disorder: Secondary | ICD-10-CM | POA: Diagnosis not present

## 2015-06-21 DIAGNOSIS — G8929 Other chronic pain: Secondary | ICD-10-CM | POA: Diagnosis not present

## 2015-06-21 DIAGNOSIS — Z96641 Presence of right artificial hip joint: Secondary | ICD-10-CM | POA: Diagnosis not present

## 2015-06-21 DIAGNOSIS — Z471 Aftercare following joint replacement surgery: Secondary | ICD-10-CM | POA: Diagnosis not present

## 2015-06-25 DIAGNOSIS — Z96641 Presence of right artificial hip joint: Secondary | ICD-10-CM | POA: Diagnosis not present

## 2015-06-25 DIAGNOSIS — Z471 Aftercare following joint replacement surgery: Secondary | ICD-10-CM | POA: Diagnosis not present

## 2015-06-26 DIAGNOSIS — G8929 Other chronic pain: Secondary | ICD-10-CM | POA: Diagnosis not present

## 2015-06-26 DIAGNOSIS — F329 Major depressive disorder, single episode, unspecified: Secondary | ICD-10-CM | POA: Diagnosis not present

## 2015-06-26 DIAGNOSIS — M1990 Unspecified osteoarthritis, unspecified site: Secondary | ICD-10-CM | POA: Diagnosis not present

## 2015-06-26 DIAGNOSIS — Z471 Aftercare following joint replacement surgery: Secondary | ICD-10-CM | POA: Diagnosis not present

## 2015-06-26 DIAGNOSIS — M519 Unspecified thoracic, thoracolumbar and lumbosacral intervertebral disc disorder: Secondary | ICD-10-CM | POA: Diagnosis not present

## 2015-06-26 DIAGNOSIS — Z96641 Presence of right artificial hip joint: Secondary | ICD-10-CM | POA: Diagnosis not present

## 2015-06-28 DIAGNOSIS — M519 Unspecified thoracic, thoracolumbar and lumbosacral intervertebral disc disorder: Secondary | ICD-10-CM | POA: Diagnosis not present

## 2015-06-28 DIAGNOSIS — Z471 Aftercare following joint replacement surgery: Secondary | ICD-10-CM | POA: Diagnosis not present

## 2015-06-28 DIAGNOSIS — F329 Major depressive disorder, single episode, unspecified: Secondary | ICD-10-CM | POA: Diagnosis not present

## 2015-06-28 DIAGNOSIS — G8929 Other chronic pain: Secondary | ICD-10-CM | POA: Diagnosis not present

## 2015-06-28 DIAGNOSIS — Z96641 Presence of right artificial hip joint: Secondary | ICD-10-CM | POA: Diagnosis not present

## 2015-06-28 DIAGNOSIS — M1990 Unspecified osteoarthritis, unspecified site: Secondary | ICD-10-CM | POA: Diagnosis not present

## 2015-06-30 ENCOUNTER — Emergency Department (HOSPITAL_COMMUNITY): Payer: Medicare Other

## 2015-06-30 ENCOUNTER — Observation Stay (HOSPITAL_COMMUNITY)
Admission: EM | Admit: 2015-06-30 | Discharge: 2015-06-30 | Disposition: A | Discharge status: Home / Self Care | Payer: Medicare Other | Attending: Orthopedic Surgery | Admitting: Orthopedic Surgery

## 2015-06-30 ENCOUNTER — Emergency Department (HOSPITAL_COMMUNITY): Payer: Medicare Other | Admitting: Certified Registered Nurse Anesthetist

## 2015-06-30 ENCOUNTER — Encounter (HOSPITAL_COMMUNITY): Payer: Self-pay | Admitting: Emergency Medicine

## 2015-06-30 ENCOUNTER — Encounter (HOSPITAL_COMMUNITY)
Admission: EM | Disposition: A | Discharge status: Home / Self Care | Payer: Self-pay | Source: Home / Self Care | Attending: Emergency Medicine

## 2015-06-30 DIAGNOSIS — Y831 Surgical operation with implant of artificial internal device as the cause of abnormal reaction of the patient, or of later complication, without mention of misadventure at the time of the procedure: Secondary | ICD-10-CM | POA: Insufficient documentation

## 2015-06-30 DIAGNOSIS — E785 Hyperlipidemia, unspecified: Secondary | ICD-10-CM | POA: Insufficient documentation

## 2015-06-30 DIAGNOSIS — E039 Hypothyroidism, unspecified: Secondary | ICD-10-CM | POA: Diagnosis not present

## 2015-06-30 DIAGNOSIS — T148 Other injury of unspecified body region: Secondary | ICD-10-CM | POA: Diagnosis not present

## 2015-06-30 DIAGNOSIS — Z7901 Long term (current) use of anticoagulants: Secondary | ICD-10-CM | POA: Insufficient documentation

## 2015-06-30 DIAGNOSIS — M159 Polyosteoarthritis, unspecified: Secondary | ICD-10-CM | POA: Diagnosis not present

## 2015-06-30 DIAGNOSIS — T84020A Dislocation of internal right hip prosthesis, initial encounter: Secondary | ICD-10-CM | POA: Diagnosis not present

## 2015-06-30 DIAGNOSIS — Z09 Encounter for follow-up examination after completed treatment for conditions other than malignant neoplasm: Secondary | ICD-10-CM

## 2015-06-30 DIAGNOSIS — S73004A Unspecified dislocation of right hip, initial encounter: Secondary | ICD-10-CM | POA: Diagnosis present

## 2015-06-30 DIAGNOSIS — Z79899 Other long term (current) drug therapy: Secondary | ICD-10-CM | POA: Diagnosis not present

## 2015-06-30 DIAGNOSIS — Z96641 Presence of right artificial hip joint: Secondary | ICD-10-CM | POA: Diagnosis not present

## 2015-06-30 DIAGNOSIS — Z96649 Presence of unspecified artificial hip joint: Secondary | ICD-10-CM | POA: Diagnosis not present

## 2015-06-30 DIAGNOSIS — M25551 Pain in right hip: Secondary | ICD-10-CM | POA: Diagnosis not present

## 2015-06-30 DIAGNOSIS — Z471 Aftercare following joint replacement surgery: Secondary | ICD-10-CM | POA: Diagnosis not present

## 2015-06-30 HISTORY — PX: HIP CLOSED REDUCTION: SHX983

## 2015-06-30 LAB — URINALYSIS, ROUTINE W REFLEX MICROSCOPIC
BILIRUBIN URINE: NEGATIVE
Glucose, UA: NEGATIVE mg/dL
HGB URINE DIPSTICK: NEGATIVE
KETONES UR: NEGATIVE mg/dL
Leukocytes, UA: NEGATIVE
NITRITE: NEGATIVE
PROTEIN: NEGATIVE mg/dL
Specific Gravity, Urine: 1.006 (ref 1.005–1.030)
pH: 8 (ref 5.0–8.0)

## 2015-06-30 LAB — BASIC METABOLIC PANEL
Anion gap: 12 (ref 5–15)
BUN: 8 mg/dL (ref 6–20)
CHLORIDE: 103 mmol/L (ref 101–111)
CO2: 26 mmol/L (ref 22–32)
CREATININE: 0.58 mg/dL (ref 0.44–1.00)
Calcium: 9.7 mg/dL (ref 8.9–10.3)
GFR calc Af Amer: >60 mL/min (ref >60)
GFR calc non Af Amer: >60 mL/min (ref >60)
GLUCOSE: 100 mg/dL — AB (ref 65–99)
POTASSIUM: 3.3 mmol/L — AB (ref 3.5–5.1)
SODIUM: 141 mmol/L (ref 135–145)

## 2015-06-30 LAB — CBC
HCT: 37.6 % (ref 36.0–46.0)
HEMOGLOBIN: 12.1 g/dL (ref 12.0–15.0)
MCH: 27.4 pg (ref 26.0–34.0)
MCHC: 32.2 g/dL (ref 30.0–36.0)
MCV: 85.1 fL (ref 78.0–100.0)
PLATELETS: 539 10*3/uL — AB (ref 150–400)
RBC: 4.42 MIL/uL (ref 3.87–5.11)
RDW: 14 % (ref 11.5–15.5)
WBC: 7.6 10*3/uL (ref 4.0–10.5)

## 2015-06-30 SURGERY — CLOSED MANIPULATION, JOINT, HIP
Anesthesia: General | Site: Hip | Laterality: Right

## 2015-06-30 MED ORDER — HYDROMORPHONE HCL 1 MG/ML IJ SOLN
1.0000 mg | Freq: Once | INTRAMUSCULAR | Status: AC
  Administered 2015-06-30: 1 mg via INTRAVENOUS
  Filled 2015-06-30: qty 1

## 2015-06-30 MED ORDER — HYDROCODONE-ACETAMINOPHEN 5-325 MG PO TABS
1.0000 | ORAL_TABLET | Freq: Four times a day (QID) | ORAL | Status: DC | PRN
  Administered 2015-06-30: 1 via ORAL
  Filled 2015-06-30: qty 1

## 2015-06-30 MED ORDER — OXYCODONE HCL 5 MG PO TABS: 5.0000 mg | ORAL_TABLET | Freq: Once | ORAL | Status: DC | PRN

## 2015-06-30 MED ORDER — METOCLOPRAMIDE HCL 5 MG/ML IJ SOLN: 5.0000 mg | Freq: Three times a day (TID) | INTRAMUSCULAR | Status: DC | PRN

## 2015-06-30 MED ORDER — RIVAROXABAN 10 MG PO TABS
10.0000 mg | ORAL_TABLET | Freq: Every day | ORAL | Status: DC
  Filled 2015-06-30: qty 1

## 2015-06-30 MED ORDER — FENTANYL CITRATE (PF) 100 MCG/2ML IJ SOLN
INTRAMUSCULAR | Status: DC | PRN
  Administered 2015-06-30: 50 ug via INTRAVENOUS

## 2015-06-30 MED ORDER — ACETAMINOPHEN 500 MG PO TABS: 500.0000 mg | ORAL_TABLET | Freq: Every day | ORAL | Status: DC

## 2015-06-30 MED ORDER — FUROSEMIDE 20 MG PO TABS: 20.0000 mg | ORAL_TABLET | Freq: Every day | ORAL | Status: DC

## 2015-06-30 MED ORDER — DULOXETINE HCL 20 MG PO CPEP: 20.0000 mg | ORAL_CAPSULE | Freq: Every day | ORAL | Status: DC

## 2015-06-30 MED ORDER — ONDANSETRON HCL 4 MG PO TABS: 4.0000 mg | ORAL_TABLET | Freq: Four times a day (QID) | ORAL | Status: DC | PRN

## 2015-06-30 MED ORDER — MORPHINE SULFATE (PF) 2 MG/ML IV SOLN: 1.0000 mg | INTRAVENOUS | Status: DC | PRN

## 2015-06-30 MED ORDER — GABAPENTIN 300 MG PO CAPS
300.0000 mg | ORAL_CAPSULE | Freq: Every day | ORAL | Status: DC
  Filled 2015-06-30: qty 1

## 2015-06-30 MED ORDER — HYDROMORPHONE HCL 1 MG/ML IJ SOLN
0.5000 mg | Freq: Once | INTRAMUSCULAR | Status: AC
  Administered 2015-06-30: 0.5 mg via INTRAVENOUS
  Filled 2015-06-30: qty 1

## 2015-06-30 MED ORDER — HYDROCODONE-ACETAMINOPHEN 5-325 MG PO TABS: 1.0000 | ORAL_TABLET | ORAL | Status: DC | PRN

## 2015-06-30 MED ORDER — PROPOFOL 10 MG/ML IV BOLUS
INTRAVENOUS | Status: DC | PRN
Start: 2015-06-30 — End: 2015-06-30
  Administered 2015-06-30: 100 mg via INTRAVENOUS

## 2015-06-30 MED ORDER — FENTANYL CITRATE (PF) 100 MCG/2ML IJ SOLN
INTRAMUSCULAR | Status: AC
  Filled 2015-06-30: qty 2

## 2015-06-30 MED ORDER — SUCCINYLCHOLINE CHLORIDE 20 MG/ML IJ SOLN
INTRAMUSCULAR | Status: DC | PRN
  Administered 2015-06-30: 100 mg via INTRAVENOUS

## 2015-06-30 MED ORDER — LACTATED RINGERS IV SOLN
INTRAVENOUS | Status: DC | PRN
  Administered 2015-06-30: 14:00:00 via INTRAVENOUS

## 2015-06-30 MED ORDER — LEVOTHYROXINE SODIUM 50 MCG PO TABS
50.0000 ug | ORAL_TABLET | Freq: Every day | ORAL | Status: DC
  Filled 2015-06-30: qty 1

## 2015-06-30 MED ORDER — ONDANSETRON HCL 4 MG/2ML IJ SOLN
INTRAMUSCULAR | Status: DC | PRN
Start: 2015-06-30 — End: 2015-06-30
  Administered 2015-06-30: 4 mg via INTRAVENOUS

## 2015-06-30 MED ORDER — ONDANSETRON HCL 4 MG/2ML IJ SOLN
4.0000 mg | Freq: Once | INTRAMUSCULAR | Status: AC
  Administered 2015-06-30: 4 mg via INTRAVENOUS
  Filled 2015-06-30: qty 2

## 2015-06-30 MED ORDER — METHOCARBAMOL 500 MG PO TABS: 500.0000 mg | ORAL_TABLET | Freq: Four times a day (QID) | ORAL | Status: DC | PRN

## 2015-06-30 MED ORDER — LACTATED RINGERS IV SOLN: INTRAVENOUS | Status: DC

## 2015-06-30 MED ORDER — ONDANSETRON HCL 4 MG/2ML IJ SOLN: 4.0000 mg | Freq: Four times a day (QID) | INTRAMUSCULAR | Status: DC | PRN

## 2015-06-30 MED ORDER — LIDOCAINE HCL (CARDIAC) 20 MG/ML IV SOLN
INTRAVENOUS | Status: DC | PRN
  Administered 2015-06-30: 50 mg via INTRAVENOUS

## 2015-06-30 MED ORDER — ONDANSETRON HCL 4 MG/2ML IJ SOLN: 4.0000 mg | Freq: Once | INTRAMUSCULAR | Status: DC | PRN

## 2015-06-30 MED ORDER — FENTANYL CITRATE (PF) 100 MCG/2ML IJ SOLN: 25.0000 ug | INTRAMUSCULAR | Status: DC | PRN

## 2015-06-30 MED ORDER — SODIUM CHLORIDE 0.9 % IV BOLUS (SEPSIS)
1000.0000 mL | Freq: Once | INTRAVENOUS | Status: AC
  Administered 2015-06-30: 1000 mL via INTRAVENOUS

## 2015-06-30 MED ORDER — LIOTHYRONINE SODIUM 25 MCG PO TABS: 25.0000 ug | ORAL_TABLET | Freq: Every day | ORAL | Status: DC

## 2015-06-30 MED ORDER — OXYCODONE HCL 5 MG/5ML PO SOLN: 5.0000 mg | Freq: Once | ORAL | Status: DC | PRN

## 2015-06-30 MED ORDER — METHOCARBAMOL 500 MG PO TABS: 500.0000 mg | ORAL_TABLET | Freq: Three times a day (TID) | ORAL | Status: DC | PRN

## 2015-06-30 MED ORDER — METOCLOPRAMIDE HCL 10 MG PO TABS: 5.0000 mg | ORAL_TABLET | Freq: Three times a day (TID) | ORAL | Status: DC | PRN

## 2015-06-30 SURGICAL SUPPLY — 11 items
BANDAGE ADH SHEER 1  50/CT (GAUZE/BANDAGES/DRESSINGS) IMPLANT
GAUZE SPONGE 4X4 12PLY STRL (GAUZE/BANDAGES/DRESSINGS) IMPLANT
GLOVE BIO SURGEON STRL SZ7 (GLOVE) ×1 IMPLANT
GOWN STRL REUS W/TWL XL LVL3 (GOWN DISPOSABLE) ×1 IMPLANT
MANIFOLD NEPTUNE II (INSTRUMENTS) ×1 IMPLANT
NDL SAFETY ECLIPSE 18X1.5 (NEEDLE) IMPLANT
NEEDLE HYPO 18GX1.5 SHARP (NEEDLE)
POSITIONER SURGICAL ARM (MISCELLANEOUS) ×1 IMPLANT
SUT BONE WAX W31G (SUTURE) ×1 IMPLANT
SYR CONTROL 10ML LL (SYRINGE) IMPLANT
TOWEL OR 17X26 10 PK STRL BLUE (TOWEL DISPOSABLE) ×2 IMPLANT

## 2015-06-30 NOTE — ED Notes (Signed)
Pt is from home.  Hip Replacement surgery at [facility] 2 weeks ago.  Completed  PT exercises today.  Twisted slightly while standing in kitchen and her R hip popped out of joint.

## 2015-06-30 NOTE — ED Provider Notes (Signed)
CSN: [id number]     Arrival date & time 06/30/15  1136 History   First MD Initiated Contact with Patient 06/30/15 1208     Chief Complaint  Patient presents with  . Hip Injury     (Consider location/radiation/quality/duration/timing/severity/associated sxs/prior Treatment) HPI....Misty KitchenMarland KitchenLevel V caveat for urgent need for intervention. Patient is status post right total hip replacement on 06/12/15 by Dr. Maureen Ralphs.   She was doing well until today when she simply twisted a small amount and  felt a popping sensation in the hip.  She was transported via EMS. No other complaints. Severity of pain is severe.  Past Medical History  Diagnosis Date  . History of chicken pox   . Allergy     "seasonal allergies"  . Depression   . Hyperlipidemia   . Thyroid disease   . Abdominal pain 02/01/2015  . Scoliosis 02/01/2015  . URI, acute 03/10/2015  . 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 Right hip pain 05/24/2015  . Complication of anesthesia   . PONV (postoperative nausea and vomiting)   . Hypothyroidism     supplement used  . Frequent headaches     no problems now.  . Peripheral neuropathy ([place]) 02/01/2015    right hand"carpal tunnel"  . 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 Arthritis     osteoarthritis-"DDD" hips. fingers, toes.   Past Surgical History  Procedure Laterality Date  . Tubal ligation  1980    left fallopian tube removal  . 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 Metacarpophalangeal joint capsulectomy / capsulotomy  1995  . Total hip arthroplasty Right 06/12/2015    Procedure: RIGHT TOTAL HIP ARTHROPLASTY ANTERIOR APPROACH;  Surgeon: Gaynelle Arabian, MD;  Location: [facility] ORS;  Service: Orthopedics;  Laterality: Right;   Family History  Problem Relation Age of Onset  . Arthritis Mother   . Dementia Mother   . Mental illness Father     suicide  . Arthritis Maternal Grandmother   . Kidney disease Son     b/l multicystic kidney disease  s/p 4 transplants   Social History  Substance Use Topics  . Smoking status: Never Smoker   . Smokeless tobacco: None  . Alcohol Use: 0.0 oz/week    0 Standard drinks or equivalent per week     Comment: rare wine   OB History    No data available     Review of Systems    Allergies  Penicillins and Sulfa antibiotics  Home Medications   Prior to Admission medications   Medication Sig Start Date End Date Taking? Authorizing Provider  acetaminophen (TYLENOL) 500 MG tablet Take 500 mg by mouth daily.   Yes Historical Provider, MD  cetirizine (ZYRTEC) 10 MG tablet Take 10 mg by mouth daily.   Yes Historical Provider, MD  DULoxetine (CYMBALTA) 20 MG capsule Take 1 capsule (20 mg total) by mouth daily. 03/05/15  Yes Mosie Lukes, MD  furosemide (LASIX) 20 MG tablet Take 1 tablet (20 mg total) by mouth daily. 03/05/15  Yes Mosie Lukes, MD  gabapentin (NEURONTIN) 300 MG capsule Take 1 capsule (300 mg total) by mouth at bedtime. 03/05/15  Yes Mosie Lukes, MD  HYDROcodone-acetaminophen (NORCO/VICODIN) 5-325 MG tablet Take 1 tablet by mouth every 6 (six) hours as needed for moderate pain or severe pain.   Yes Historical Provider, MD  levothyroxine (SYNTHROID,  LEVOTHROID) 50 MCG tablet Take 1 tablet (50 mcg total) by mouth daily. 03/05/15  Yes Mosie Lukes, MD  liothyronine (CYTOMEL) 25 MCG tablet Take 1 tablet (25 mcg total) by mouth daily. 03/05/15  Yes Mosie Lukes, MD  methocarbamol (ROBAXIN) 500 MG tablet Take 1 tablet (500 mg total) by mouth every 6 (six) hours as needed for muscle spasms. 06/13/15  Yes Arlee Muslim, PA-C  oxyCODONE (OXY IR/ROXICODONE) 5 MG immediate release tablet Take 1-2 tablets (5-10 mg total) by mouth every 3 (three) hours as needed for moderate pain or severe pain. Patient taking differently: Take 5 mg by mouth at bedtime as needed for moderate pain or severe pain.  06/13/15  Yes Arlee Muslim, PA-C  rivaroxaban (XARELTO) 10 MG TABS tablet Take 1 tablet (10 mg  total) by mouth daily with breakfast. Take Xarelto for two and a half more weeks, then discontinue Xarelto. Once the patient has completed the blood thinner regimen, then take a Baby 81 mg Aspirin daily for three more weeks. 06/13/15  Yes Arlee Muslim, PA-C   BP 145/69 mmHg  Pulse 85  Temp(Src) 98.6 F (37 C) (Oral)  Resp 20  SpO2 99% Physical Exam  Constitutional: She is oriented to person, place, and time. She appears well-developed and well-nourished.  HENT:  Head: Normocephalic and atraumatic.  Eyes: Conjunctivae and EOM are normal. Pupils are equal, round, and reactive to light.  Neck: Normal range of motion. Neck supple.  Cardiovascular: Normal rate and regular rhythm.   Pulmonary/Chest: Effort normal and breath sounds normal.  Abdominal: Soft. Bowel sounds are normal.  Musculoskeletal:  Tender right lateral hip. Unable to do range of motion  Neurological: She is alert and oriented to person, place, and time.  Skin: Skin is warm and dry.  Psychiatric: She has a normal mood and affect. Her behavior is normal.  Nursing note and vitals reviewed.   ED Course  Procedures (including critical care time) Labs Review Labs Reviewed  CBC - Abnormal; Notable for the following:    Platelets 539 (*)    All other components within normal limits  BASIC METABOLIC PANEL  URINALYSIS, ROUTINE W REFLEX MICROSCOPIC (NOT AT [facility])    Imaging Review Dg Hip Unilat  With Pelvis 2-3 Views Right  06/30/2015  CLINICAL DATA:  Pt c/o right hip pain, foreshortened, rotated leg, s/p standing in her Barker, took step back and leg "went out." causing fall. Unable to bear wt on leg EXAM: DG HIP (WITH OR WITHOUT PELVIS) 2-3V RIGHT COMPARISON:  01/22/2015 FINDINGS: Patient has undergone interval right hip arthroplasty. The femoral head component is anteriorly and superiorly dislocated relative to the acetabular component. There is no acute fracture. Status post left hip arthroplasty which is intact. Metallic  densities overlie the central pelvis, likely external to the patient. IMPRESSION: Dislocation of right hip arthroplasty. Electronically Signed   By: Nolon Nations M.D.   On: 06/30/2015 12:32   I have personally reviewed and evaluated these images and lab results as part of my medical decision-making.   EKG Interpretation None      MDM   Final diagnoses:  History of total right hip replacement  Acute right hip pain    Plain films of right hip reveal a dislocation of the right hip arthroplasty. Multiple doses of pain medicine given. Discussed findings with patient and her husband. She will be evaluated by orthopedic surgeon Dr. Rolena Infante.    Nat Christen, MD 06/30/15 1304

## 2015-06-30 NOTE — Transfer of Care (Signed)
Immediate Anesthesia Transfer of Care Note  Patient: Misty Barker  Procedure(s) Performed: Procedure(s): CLOSED MANIPULATION HIP (Right)  Patient Location: PACU  Anesthesia Type:General  Level of Consciousness:  sedated, patient cooperative and responds to stimulation  Airway & Oxygen Therapy:Patient Spontanous Breathing and Patient connected to face mask oxgen  Post-op Assessment:  Report given to PACU RN and Post -op Vital signs reviewed and stable  Post vital signs:  Reviewed and stable  Last Vitals:  Filed Vitals:   06/30/15 1145 06/30/15 1318  BP: 145/69 141/65  Pulse: 85 85  Temp: 37 C 36.8 C  Resp: 20 18    Complications: No apparent anesthesia complications

## 2015-06-30 NOTE — Op Note (Signed)
NAME:  Misty Barker, BATTERMAN NO.:  [id number]  MEDICAL RECORD NO.:  [id number]  LOCATION:  [id number]                         FACILITY:  [facility]  PHYSICIAN:  Martez Weiand D. Rolena Infante, M.D. DATE OF BIRTH:  1946-02-28  DATE OF PROCEDURE:  06/30/2015 DATE OF DISCHARGE:                              OPERATIVE REPORT   PREOPERATIVE DIAGNOSIS:  Right total hip prosthesis dislocation.  POSTOPERATIVE DIAGNOSIS:  Right total hip prosthesis dislocation.  OPERATIVE PROCEDURE:  Closed reduction in the operating room of right total hip dislocation.  HISTORY:  This is a pleasant 70 year old woman who had a total hip done 19 days ago and was ultimately discharged.  She was home recovering when she had immediate pop and pain in her right hip and inability to ambulate.  She was brought to the emergency room.  X-rays confirmed the dislocation without fracture.  As a result, I was contacted for further evaluation and treatment.  OPERATIVE REPORT:  The patient was brought to the operating room, placed supine on the operating table.  After successful induction of general anesthesia and endotracheal intubation, time-out was done confirming the patient and procedure.  Once this was done, the pelvis was stabilized by the circulating nurse and gentle longitudinal traction and external rotation was applied to the leg to bring it out to length and then it was gently internally rotated to reduce it.  This was essentially an atraumatic reduction with no significant issues.  The hip did reduce well.  X-rays in the OR confirmed satisfactory reduction of the anterior hip dislocation.  A knee immobilizer was applied and she was transferred to the PACU without incident.  At the end of the case, all needle and sponge counts were correct.  There were no adverse intraoperative events.     Nicle Connole D. Rolena Infante, M.D.     DDB/MEDQ  D:  06/30/2015  T:  06/30/2015  Job:  [id number]  cc:   Gaynelle Arabian, M.D. Fax:  [phone]

## 2015-06-30 NOTE — Anesthesia Postprocedure Evaluation (Signed)
Anesthesia Post Note  Patient: Misty Barker  Procedure(s) Performed: Procedure(s) (LRB): CLOSED MANIPULATION HIP (Right)  Patient location during evaluation: PACU Anesthesia Type: General Level of consciousness: awake, awake and alert and oriented Pain management: pain level controlled Vital Signs Assessment: post-procedure vital signs reviewed and stable Respiratory status: spontaneous breathing, nonlabored ventilation, respiratory function stable and patient connected to nasal cannula oxygen Cardiovascular status: blood pressure returned to baseline Postop Assessment: no headache Anesthetic complications: no    Last Vitals:  Filed Vitals:   06/30/15 1318 06/30/15 1415  BP: 141/65   Pulse: 85   Temp: 36.8 C 36.7 C  Resp: 18     Last Pain:  Filed Vitals:   06/30/15 1422  PainSc: 6     LLE Motor Response: Purposeful movement (06/30/15 1415) LLE Sensation: Full sensation (06/30/15 1415) RLE Motor Response: Purposeful movement (06/30/15 1415) RLE Sensation: Full sensation (06/30/15 1415)      Yuniel Blaney COKER

## 2015-06-30 NOTE — ED Notes (Signed)
Bed: WA21 Expected date:  Expected time:  Means of arrival:  Comments: Hip Dislocation

## 2015-06-30 NOTE — Brief Op Note (Signed)
06/30/2015  2:06 PM  PATIENT:  Margean L Vogt-Nichols  70 y.o. female  PRE-OPERATIVE DIAGNOSIS:  dislocated total right hip  POST-OPERATIVE DIAGNOSIS:  * No post-op diagnosis entered *  PROCEDURE:  Procedure(s): CLOSED MANIPULATION HIP (Right)  SURGEON:  Surgeon(s) and Role:    * Melina Schools, MD - Primary  PHYSICIAN ASSISTANT:   ASSISTANTS: none   ANESTHESIA:   general  EBL:  Total I/O In: 800 [I.V.:800] Out: -   BLOOD ADMINISTERED:none  DRAINS: none   LOCAL MEDICATIONS USED:  NONE  SPECIMEN:  No Specimen  DISPOSITION OF SPECIMEN:  N/A  COUNTS:  YES  TOURNIQUET:  * No tourniquets in log *  DICTATION: .Other Dictation: Dictation Number [id number]  PLAN OF CARE: Admit for overnight observation  PATIENT DISPOSITION:  PACU - hemodynamically stable.

## 2015-06-30 NOTE — Anesthesia Preprocedure Evaluation (Signed)
Anesthesia Evaluation  Patient identified by MRN, date of birth, ID band Patient awake    Reviewed: Allergy & Precautions, NPO status , Patient's Chart, lab work & pertinent test results  Airway Mallampati: II  TM Distance: >3 FB Neck ROM: Full    Dental  (+) Teeth Intact, Dental Advisory Given   Pulmonary    breath sounds clear to auscultation       Cardiovascular  Rhythm:Regular Rate:Normal     Neuro/Psych    GI/Hepatic   Endo/Other    Renal/GU      Musculoskeletal   Abdominal   Peds  Hematology   Anesthesia Other Findings   Reproductive/Obstetrics                             Anesthesia Physical Anesthesia Plan  ASA: II  Anesthesia Plan: General   Post-op Pain Management:    Induction: Intravenous  Airway Management Planned: Mask  Additional Equipment:   Intra-op Plan:   Post-operative Plan:   Informed Consent: I have reviewed the patients History and Physical, chart, labs and discussed the procedure including the risks, benefits and alternatives for the proposed anesthesia with the patient or authorized representative who has indicated his/her understanding and acceptance.   Dental advisory given  Plan Discussed with: CRNA and Anesthesiologist  Anesthesia Plan Comments:         Anesthesia Quick Evaluation

## 2015-06-30 NOTE — H&P (Signed)
Misty Homans, MD Chief Complaint: Right hip pain this AM History: Patient is status post right total hip replacement on 06/12/15 by Dr. Maureen Ralphs. She was doing well until today when she simply twisted a small amount and felt a popping sensation in the hip. She was transported via EMS. No other complaints. Severity of pain is severe.  Past Medical History  Diagnosis Date  . History of chicken pox   . Allergy     "seasonal allergies"  . Depression   . Hyperlipidemia   . Thyroid disease   . Abdominal pain 02/01/2015  . Scoliosis 02/01/2015  . URI, acute 03/10/2015  . 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 Right hip pain 05/24/2015  . Complication of anesthesia   . PONV (postoperative nausea and vomiting)   . Hypothyroidism     supplement used  . Frequent headaches     no problems now.  . Peripheral neuropathy ([place]) 02/01/2015    right hand"carpal tunnel"  . 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 Arthritis     osteoarthritis-"DDD" hips. fingers, toes.    Allergies  Allergen Reactions  . Penicillins Hives    Has patient had a PCN reaction causing immediate rash, facial/tongue/throat swelling, SOB or lightheadedness with hypotension: No Has patient had a PCN reaction causing severe rash involving mucus membranes or skin necrosis: No Has patient had a PCN reaction that required hospitalization No Has patient had a PCN reaction occurring within the last 10 years: No If all of the above answers are "NO", then may proceed with Cephalosporin use.  . Sulfa Antibiotics Hives    No current facility-administered medications on file prior to encounter.   Current Outpatient Prescriptions on File Prior to Encounter  Medication Sig Dispense Refill  . cetirizine (ZYRTEC) 10 MG tablet Take 10 mg by mouth daily.    . DULoxetine (CYMBALTA) 20 MG capsule Take 1 capsule (20 mg total) by mouth daily. 30 capsule 0  . furosemide (LASIX) 20 MG tablet  Take 1 tablet (20 mg total) by mouth daily. 90 tablet 2  . gabapentin (NEURONTIN) 300 MG capsule Take 1 capsule (300 mg total) by mouth at bedtime. 90 capsule 3  . levothyroxine (SYNTHROID, LEVOTHROID) 50 MCG tablet Take 1 tablet (50 mcg total) by mouth daily. 90 tablet 2  . liothyronine (CYTOMEL) 25 MCG tablet Take 1 tablet (25 mcg total) by mouth daily. 90 tablet 2  . methocarbamol (ROBAXIN) 500 MG tablet Take 1 tablet (500 mg total) by mouth every 6 (six) hours as needed for muscle spasms. 90 tablet 0  . oxyCODONE (OXY IR/ROXICODONE) 5 MG immediate release tablet Take 1-2 tablets (5-10 mg total) by mouth every 3 (three) hours as needed for moderate pain or severe pain. (Patient taking differently: Take 5 mg by mouth at bedtime as needed for moderate pain or severe pain. ) 90 tablet 0  . rivaroxaban (XARELTO) 10 MG TABS tablet Take 1 tablet (10 mg total) by mouth daily with breakfast. Take Xarelto for two and a half more weeks, then discontinue Xarelto. Once the patient has completed the blood thinner regimen, then take a Baby 81 mg Aspirin daily for three more weeks. 20 tablet 0    Physical Exam: Filed Vitals:   06/30/15 1145  BP: 145/69  Pulse: 85  Temp: 98.6 F (37 C)  Resp: 20  No sob/cp Lungs - CTA abd soft/nt Intact DP/PT  pulses 1+ symmetric EHL/TA/GA intact bilaterally Compartments soft/nt A+Ox3  Image: Dg Pelvis Portable  06/12/2015  CLINICAL DATA:  Status post right-sided total hip replacement EXAM: DG C-ARM 1-60 MIN-NO REPORT; PORTABLE PELVIS 1-2 VIEWS FLUOROSCOPY TIME:  0 minutes 11 second; no submitted fluoroscopic images. COMPARISON:  None. FINDINGS: There are total hip replacements bilaterally with prosthetic components appearing well-seated bilaterally. No acute fracture or dislocation. There is soft tissue air on the right as well as a surgical drain on the right. IMPRESSION: Total hip replacement prostheses appear well seated bilaterally. Drain on the right as well as  soft tissue air, expected acute postoperative findings. No acute fracture or dislocation. Electronically Signed   By: Lowella Grip III M.D.   On: 06/12/2015 13:08   Dg C-arm 1-60 Min-no Report  06/12/2015  CLINICAL DATA: surgery C-ARM 1-60 MINUTES Fluoroscopy was utilized by the requesting physician.  No radiographic interpretation.   Dg Hip Unilat  With Pelvis 2-3 Views Right  06/30/2015  CLINICAL DATA:  Pt c/o right hip pain, foreshortened, rotated leg, s/p standing in her kitchen, took step back and leg "went out." causing fall. Unable to bear wt on leg EXAM: DG HIP (WITH OR WITHOUT PELVIS) 2-3V RIGHT COMPARISON:  01/22/2015 FINDINGS: Patient has undergone interval right hip arthroplasty. The femoral head component is anteriorly and superiorly dislocated relative to the acetabular component. There is no acute fracture. Status post left hip arthroplasty which is intact. Metallic densities overlie the central pelvis, likely external to the patient. IMPRESSION: Dislocation of right hip arthroplasty. Electronically Signed   By: Nolon Nations M.D.   On: 06/30/2015 12:32    A/P:  Patient 19 days s/p THR with right hip dislocation. Unknown etiology - denies acute trauma, fall, or postural indiscretion. No focal neuro-vascular deficits Plan on closed reduction in the operating room Risks of fracture, inability to reduce, need for open reduction, death, stroke, infection, paralysis

## 2015-06-30 NOTE — Evaluation (Signed)
Physical Therapy Evaluation Patient Details Name: Misty Barker MRN: [id number] DOB: 03/19/1946 Today's Date: 06/30/2015   History of Present Illness  dislocated R DATHA from 2 weeks ago. Closed reduction  Clinical Impression  Them patient is down about the situation. Instructed in [place], placement and open for relief of pressure. No further PT needs.    Follow Up Recommendations No PT follow up    Equipment Recommendations  None recommended by PT    Recommendations for Other Services       Precautions / Restrictions Precautions Precautions: Fall Required Braces or Orthoses: Knee Immobilizer - Right Knee Immobilizer - Right: On at all times Restrictions Weight Bearing Restrictions: No      Mobility  Bed Mobility   Bed Mobility: Supine to Sit;Sit to Supine     Supine to sit: Min guard Sit to supine: Min guard   General bed mobility comments: cues for technique  Transfers   Equipment used: Rolling walker (2 wheeled) Transfers: Sit to/from Stand Sit to Stand: Min assist         General transfer comment: from low surface, pressure on anterior tighm R leg supported  Ambulation/Gait Ambulation/Gait assistance: Supervision Ambulation Distance (Feet): 100 Feet         General Gait Details: cues for sequence, posture and position from [employer]            Wheelchair Mobility    Modified Rankin (Stroke Patients Only)       Balance                                             Pertinent Vitals/Pain Pain Score: 6  Pain Location: R thigh Pain Descriptors / Indicators: Tender;Grimacing;Guarding Pain Intervention(s): Limited activity within patient's tolerance    Home Living Family/patient expects to be discharged to:: Private residence Living Arrangements: Spouse/significant other Available Help at Discharge: Family Type of Home: House Home Access: Stairs to enter   Technical brewer of Steps: 1 Home Layout:  One level Home Equipment: Environmental consultant - 2 wheels;Shower seat - built in      Prior Pension scheme manager        Extremity/Trunk Assessment               Lower Extremity Assessment: RLE deficits/detail RLE Deficits / Details: in ki    Cervical / Trunk Assessment: Kyphotic  Communication      Cognition Arousal/Alertness: Awake/alert Behavior During Therapy: WFL for tasks assessed/performed Overall Cognitive Status: Within Functional Limits for tasks assessed                      General Comments      Exercises        Assessment/Plan    PT Assessment Patent does not need any further PT services  PT Diagnosis     PT Problem List    PT Treatment Interventions     PT Goals (Current goals can be found in the Care Plan section) Acute Rehab PT Goals PT Goal Formulation: All assessment and education complete, DC therapy    Frequency     Barriers to discharge        Co-evaluation               End of  Session Equipment Utilized During Treatment: Right knee immobilizer Activity Tolerance: Patient tolerated treatment well Patient left: in bed;with call bell/phone within reach Nurse Communication: Mobility status    Functional Assessment Tool Used: clincal judgement Functional Limitation: Mobility: Walking and moving around Mobility: Walking and Moving Around Current Status [id number]): At least 1 percent but less than 20 percent impaired, limited or restricted Mobility: Walking and Moving Around Goal Status [phone]): At least 1 percent but less than 20 percent impaired, limited or restricted Mobility: Walking and Moving Around Discharge Status [phone]): At least 1 percent but less than 20 percent impaired, limited or restricted    Time: [id number] PT Time Calculation (min) (ACUTE ONLY): 23 min   Charges:   PT Evaluation $PT Eval Low Complexity: 1 Procedure PT Treatments $Gait Training: 8-22 mins   PT G Codes:   PT G-Codes  **NOT FOR INPATIENT CLASS** Functional Assessment Tool Used: clincal judgement Functional Limitation: Mobility: Walking and moving around Mobility: Walking and Moving Around Current Status [id number]): At least 1 percent but less than 20 percent impaired, limited or restricted Mobility: Walking and Moving Around Goal Status [phone]): At least 1 percent but less than 20 percent impaired, limited or restricted Mobility: Walking and Moving Around Discharge Status [phone]): At least 1 percent but less than 20 percent impaired, limited or restricted    Claretha Cooper 06/30/2015, 5:29 PM

## 2015-07-01 ENCOUNTER — Encounter (HOSPITAL_COMMUNITY): Payer: Self-pay | Admitting: Orthopedic Surgery

## 2015-07-02 DIAGNOSIS — F329 Major depressive disorder, single episode, unspecified: Secondary | ICD-10-CM | POA: Diagnosis not present

## 2015-07-02 DIAGNOSIS — G8929 Other chronic pain: Secondary | ICD-10-CM | POA: Diagnosis not present

## 2015-07-02 DIAGNOSIS — M519 Unspecified thoracic, thoracolumbar and lumbosacral intervertebral disc disorder: Secondary | ICD-10-CM | POA: Diagnosis not present

## 2015-07-02 DIAGNOSIS — M1990 Unspecified osteoarthritis, unspecified site: Secondary | ICD-10-CM | POA: Diagnosis not present

## 2015-07-02 DIAGNOSIS — Z471 Aftercare following joint replacement surgery: Secondary | ICD-10-CM | POA: Diagnosis not present

## 2015-07-02 DIAGNOSIS — Z96641 Presence of right artificial hip joint: Secondary | ICD-10-CM | POA: Diagnosis not present

## 2015-07-05 DIAGNOSIS — G8929 Other chronic pain: Secondary | ICD-10-CM | POA: Diagnosis not present

## 2015-07-05 DIAGNOSIS — Z471 Aftercare following joint replacement surgery: Secondary | ICD-10-CM | POA: Diagnosis not present

## 2015-07-05 DIAGNOSIS — F329 Major depressive disorder, single episode, unspecified: Secondary | ICD-10-CM | POA: Diagnosis not present

## 2015-07-05 DIAGNOSIS — M1990 Unspecified osteoarthritis, unspecified site: Secondary | ICD-10-CM | POA: Diagnosis not present

## 2015-07-05 DIAGNOSIS — Z96641 Presence of right artificial hip joint: Secondary | ICD-10-CM | POA: Diagnosis not present

## 2015-07-05 DIAGNOSIS — M519 Unspecified thoracic, thoracolumbar and lumbosacral intervertebral disc disorder: Secondary | ICD-10-CM | POA: Diagnosis not present

## 2015-07-11 DIAGNOSIS — Z471 Aftercare following joint replacement surgery: Secondary | ICD-10-CM | POA: Diagnosis not present

## 2015-07-11 DIAGNOSIS — Z96641 Presence of right artificial hip joint: Secondary | ICD-10-CM | POA: Diagnosis not present

## 2015-07-12 NOTE — Discharge Summary (Signed)
Physician Discharge Summary  [name] MRN: [id number] DOB/AGE: 1945/07/25 70 y.o.  Admit date: 06/30/2015 Discharge date: 07/12/2015  Admission Diagnoses:  Right Total Hip Prosthesis dislocation  Discharge Diagnoses:  Active Problems:   Hip dislocation, right [facility])   Past Medical History  Diagnosis Date  . History of chicken pox   . Allergy     "seasonal allergies"  . Depression   . Hyperlipidemia   . Thyroid disease   . Abdominal pain 02/01/2015  . Scoliosis 02/01/2015  . URI, acute 03/10/2015  . 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 Right hip pain 05/24/2015  . Complication of anesthesia   . PONV (postoperative nausea and vomiting)   . Hypothyroidism     supplement used  . Frequent headaches     no problems now.  . Peripheral neuropathy ([place]) 02/01/2015    right hand"carpal tunnel"  . 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 Arthritis     osteoarthritis-"DDD" hips. fingers, toes.    Surgeries: Procedure(s): CLOSED MANIPULATION HIP on 06/30/2015   Consultants (if any):    Discharged Condition: Improved  Hospital Course: Misty Barker is an 70 y.o. female who was admitted 06/30/2015 with a diagnosis of Right total hip prosthesis dislocation and went to the operating room on 06/30/2015 and underwent the above named procedures.  Pt was discharged the same day.  She was given perioperative antibiotics:  Anti-infectives    None    .  She was given sequential compression devices, early ambulation, and TED for DVT prophylaxis.  She benefited maximally from the hospital stay and there were no complications.    Recent vital signs:  Filed Vitals:   06/30/15 1500 06/30/15 1510  BP:  115/69  Pulse:  84  Temp: 98.6 F (37 C) 98.6 F (37 C)  Resp:  16    Recent laboratory studies:  Lab Results  Component Value Date   HGB 12.1 06/30/2015   HGB 11.6* 06/13/2015   HGB 12.8 06/04/2015    Lab Results  Component Value Date   WBC 7.6 06/30/2015   PLT 539* 06/30/2015   Lab Results  Component Value Date   INR 1.04 06/04/2015   Lab Results  Component Value Date   NA 141 06/30/2015   K 3.3* 06/30/2015   CL 103 06/30/2015   CO2 26 06/30/2015   BUN 8 06/30/2015   CREATININE 0.58 06/30/2015   GLUCOSE 100* 06/30/2015    Discharge Medications:     Medication List    STOP taking these medications        acetaminophen 500 MG tablet  Commonly known as:  TYLENOL     oxyCODONE 5 MG immediate release tablet  Commonly known as:  Oxy IR/ROXICODONE      TAKE these medications        cetirizine 10 MG tablet  Commonly known as:  ZYRTEC  Take 10 mg by mouth daily.     DULoxetine 20 MG capsule  Commonly known as:  CYMBALTA  Take 1 capsule (20 mg total) by mouth daily.     furosemide 20 MG tablet  Commonly known as:  LASIX  Take 1 tablet (20 mg total) by mouth daily.     gabapentin 300 MG capsule  Commonly known as:  NEURONTIN  Take 1 capsule (300 mg total) by mouth at bedtime.     HYDROcodone-acetaminophen 5-325 MG  tablet  Commonly known as:  NORCO  Take 1 tablet by mouth every 4 (four) hours as needed.     levothyroxine 50 MCG tablet  Commonly known as:  SYNTHROID, LEVOTHROID  Take 1 tablet (50 mcg total) by mouth daily.     liothyronine 25 MCG tablet  Commonly known as:  CYTOMEL  Take 1 tablet (25 mcg total) by mouth daily.     methocarbamol 500 MG tablet  Commonly known as:  ROBAXIN  Take 1 tablet (500 mg total) by mouth 3 (three) times daily as needed for muscle spasms.     rivaroxaban 10 MG Tabs tablet  Commonly known as:  XARELTO  Take 1 tablet (10 mg total) by mouth daily with breakfast. Take Xarelto for two and a half more weeks, then discontinue Xarelto. Once the patient has completed the blood thinner regimen, then take a Baby 81 mg Aspirin daily for three more weeks.        Diagnostic Studies: Dg Pelvis Portable  06/12/2015  CLINICAL  DATA:  Status post right-sided total hip replacement EXAM: DG C-ARM 1-60 MIN-NO REPORT; PORTABLE PELVIS 1-2 VIEWS FLUOROSCOPY TIME:  0 minutes 11 second; no submitted fluoroscopic images. COMPARISON:  None. FINDINGS: There are total hip replacements bilaterally with prosthetic components appearing well-seated bilaterally. No acute fracture or dislocation. There is soft tissue air on the right as well as a surgical drain on the right. IMPRESSION: Total hip replacement prostheses appear well seated bilaterally. Drain on the right as well as soft tissue air, expected acute postoperative findings. No acute fracture or dislocation. Electronically Signed   By: Lowella Grip III M.D.   On: 06/12/2015 13:08   Dg C-arm 1-60 Min-no Report  06/12/2015  CLINICAL DATA: surgery C-ARM 1-60 MINUTES Fluoroscopy was utilized by the requesting physician.  No radiographic interpretation.   Dg Hip Port Unilat With Pelvis 1v Right  06/30/2015  CLINICAL DATA:  Status post reduction of right hip dislocation EXAM: DG HIP (WITH OR WITHOUT PELVIS) 1V PORT RIGHT COMPARISON:  Right hip radiographs from earlier today FINDINGS: The patient is status post right total hip arthroplasty. There has been successful reduction of the right hip dislocation, with no residual malalignment at the right hip joint. No evidence of hardware fracture or loosening. No osseous fracture or suspicious focal osseous lesion. IMPRESSION: Status post right total hip arthroplasty, with no evidence of hardware complication. Successful reduction of right hip dislocation, with no residual malalignment. Electronically Signed   By: Ilona Sorrel M.D.   On: 06/30/2015 14:40   Dg Hip Unilat  With Pelvis 2-3 Views Right  06/30/2015  CLINICAL DATA:  Pt c/o right hip pain, foreshortened, rotated leg, s/p standing in her kitchen, took step back and leg "went out." causing fall. Unable to bear wt on leg EXAM: DG HIP (WITH OR WITHOUT PELVIS) 2-3V RIGHT COMPARISON:   01/22/2015 FINDINGS: Patient has undergone interval right hip arthroplasty. The femoral head component is anteriorly and superiorly dislocated relative to the acetabular component. There is no acute fracture. Status post left hip arthroplasty which is intact. Metallic densities overlie the central pelvis, likely external to the patient. IMPRESSION: Dislocation of right hip arthroplasty. Electronically Signed   By: Nolon Nations M.D.   On: 06/30/2015 12:32    Disposition: 01-Home or Self Care        Follow-up Information    Follow up with Dahlia Bailiff, MD In 1 week.   Specialty:  Orthopedic Surgery   Why:  For  wound re-check   Contact information:   [address] [place] [address] [id number]        Signed: Valinda Hoar 07/12/2015, 9:27 AM

## 2015-07-16 ENCOUNTER — Emergency Department (HOSPITAL_COMMUNITY): Payer: Medicare Other

## 2015-07-16 ENCOUNTER — Emergency Department (HOSPITAL_COMMUNITY)
Admission: EM | Admit: 2015-07-16 | Discharge: 2015-07-16 | Disposition: A | Discharge status: Home / Self Care | Payer: Medicare Other | Attending: Emergency Medicine | Admitting: Emergency Medicine

## 2015-07-16 ENCOUNTER — Encounter (HOSPITAL_COMMUNITY): Payer: Self-pay | Admitting: Emergency Medicine

## 2015-07-16 DIAGNOSIS — Y658 Other specified misadventures during surgical and medical care: Secondary | ICD-10-CM | POA: Insufficient documentation

## 2015-07-16 DIAGNOSIS — Y9389 Activity, other specified: Secondary | ICD-10-CM | POA: Insufficient documentation

## 2015-07-16 DIAGNOSIS — Z79899 Other long term (current) drug therapy: Secondary | ICD-10-CM | POA: Diagnosis not present

## 2015-07-16 DIAGNOSIS — G629 Polyneuropathy, unspecified: Secondary | ICD-10-CM | POA: Insufficient documentation

## 2015-07-16 DIAGNOSIS — S73004A Unspecified dislocation of right hip, initial encounter: Secondary | ICD-10-CM

## 2015-07-16 DIAGNOSIS — Z8709 Personal history of other diseases of the respiratory system: Secondary | ICD-10-CM | POA: Insufficient documentation

## 2015-07-16 DIAGNOSIS — Y998 Other external cause status: Secondary | ICD-10-CM | POA: Diagnosis not present

## 2015-07-16 DIAGNOSIS — X58XXXA Exposure to other specified factors, initial encounter: Secondary | ICD-10-CM | POA: Insufficient documentation

## 2015-07-16 DIAGNOSIS — M25551 Pain in right hip: Secondary | ICD-10-CM | POA: Diagnosis not present

## 2015-07-16 DIAGNOSIS — S79911A Unspecified injury of right hip, initial encounter: Secondary | ICD-10-CM | POA: Diagnosis present

## 2015-07-16 DIAGNOSIS — Z96641 Presence of right artificial hip joint: Secondary | ICD-10-CM | POA: Diagnosis not present

## 2015-07-16 DIAGNOSIS — Z8619 Personal history of other infectious and parasitic diseases: Secondary | ICD-10-CM | POA: Diagnosis not present

## 2015-07-16 DIAGNOSIS — Z7982 Long term (current) use of aspirin: Secondary | ICD-10-CM | POA: Insufficient documentation

## 2015-07-16 DIAGNOSIS — M419 Scoliosis, unspecified: Secondary | ICD-10-CM | POA: Diagnosis not present

## 2015-07-16 DIAGNOSIS — Z8679 Personal history of other diseases of the circulatory system: Secondary | ICD-10-CM | POA: Insufficient documentation

## 2015-07-16 DIAGNOSIS — H9191 Unspecified hearing loss, right ear: Secondary | ICD-10-CM | POA: Insufficient documentation

## 2015-07-16 DIAGNOSIS — M1990 Unspecified osteoarthritis, unspecified site: Secondary | ICD-10-CM | POA: Insufficient documentation

## 2015-07-16 DIAGNOSIS — E039 Hypothyroidism, unspecified: Secondary | ICD-10-CM | POA: Insufficient documentation

## 2015-07-16 DIAGNOSIS — T84020A Dislocation of internal right hip prosthesis, initial encounter: Secondary | ICD-10-CM | POA: Insufficient documentation

## 2015-07-16 DIAGNOSIS — Z880 Allergy status to penicillin: Secondary | ICD-10-CM | POA: Insufficient documentation

## 2015-07-16 DIAGNOSIS — Y9289 Other specified places as the place of occurrence of the external cause: Secondary | ICD-10-CM | POA: Diagnosis not present

## 2015-07-16 DIAGNOSIS — R52 Pain, unspecified: Secondary | ICD-10-CM | POA: Diagnosis not present

## 2015-07-16 DIAGNOSIS — F329 Major depressive disorder, single episode, unspecified: Secondary | ICD-10-CM | POA: Insufficient documentation

## 2015-07-16 MED ORDER — PROPOFOL 10 MG/ML IV BOLUS
INTRAVENOUS | Status: DC | PRN
  Administered 2015-07-16: 70 mg via INTRAVENOUS

## 2015-07-16 MED ORDER — FENTANYL CITRATE (PF) 100 MCG/2ML IJ SOLN
INTRAMUSCULAR | Status: DC | PRN
  Administered 2015-07-16: 25 ug via INTRAVENOUS

## 2015-07-16 MED ORDER — PROPOFOL 10 MG/ML IV BOLUS
1.0000 mg/kg | Freq: Once | INTRAVENOUS | Status: AC
  Administered 2015-07-16: 46 mg via INTRAVENOUS
  Filled 2015-07-16: qty 20

## 2015-07-16 MED ORDER — FENTANYL CITRATE (PF) 100 MCG/2ML IJ SOLN
50.0000 ug | Freq: Once | INTRAMUSCULAR | Status: AC
  Administered 2015-07-16: 50 ug via INTRAVENOUS
  Filled 2015-07-16: qty 2

## 2015-07-16 NOTE — ED Provider Notes (Signed)
CSN: [id number]     Arrival date & time 07/16/15  1652 History   First MD Initiated Contact with Patient 07/16/15 1658     Chief Complaint  Patient presents with  . Hip Injury     (Consider location/radiation/quality/duration/timing/severity/associated sxs/prior Treatment) Patient is a 70 y.o. female presenting with hip pain.  Hip Pain This is a new problem. The current episode started less than 1 hour ago. The problem occurs constantly. The problem has not changed (waxing and waning) since onset.Pertinent negatives include no chest pain, no abdominal pain, no headaches and no shortness of breath. Exacerbated by: movement. Relieved by: fentanyl. Treatments tried: fentanyl en route. The treatment provided mild relief.    Past Medical History  Diagnosis Date  . History of chicken pox   . Allergy     "seasonal allergies"  . Depression   . Hyperlipidemia   . Thyroid disease   . Abdominal pain 02/01/2015  . Scoliosis 02/01/2015  . URI, acute 03/10/2015  . 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 Right hip pain 05/24/2015  . Complication of anesthesia   . PONV (postoperative nausea and vomiting)   . Hypothyroidism     supplement used  . Frequent headaches     no problems now.  . Peripheral neuropathy ([place]) 02/01/2015    right hand"carpal tunnel"  . 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 Arthritis     osteoarthritis-"DDD" hips. fingers, toes.   Past Surgical History  Procedure Laterality Date  . Tubal ligation  1980    left fallopian tube removal  . 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 Metacarpophalangeal joint capsulectomy / capsulotomy  1995  . Total hip arthroplasty Right 06/12/2015    Procedure: RIGHT TOTAL HIP ARTHROPLASTY ANTERIOR APPROACH;  Surgeon: Gaynelle Arabian, MD;  Location: [facility];  Service: Orthopedics;  Laterality: Right;  . Hip closed reduction Right 06/30/2015    Procedure: CLOSED MANIPULATION HIP;  Surgeon: Melina Schools, MD;  Location: [facility];  Service: Orthopedics;  Laterality: Right;   Family History  Problem Relation Age of Onset  . Arthritis Mother   . Dementia Mother   . Mental illness Father     suicide  . Arthritis Maternal Grandmother   . Kidney disease Son     b/l multicystic kidney disease s/p 4 transplants   Social History  Substance Use Topics  . Smoking status: Never Smoker   . Smokeless tobacco: None  . Alcohol Use: 0.0 oz/week    0 Standard drinks or equivalent per week     Comment: rare wine   OB History    No data available     Review of Systems  Constitutional: Negative for fever.  HENT: Negative for sore throat.   Eyes: Negative for visual disturbance.  Respiratory: Negative for cough and shortness of breath.   Cardiovascular: Negative for chest pain.  Gastrointestinal: Negative for abdominal pain.  Genitourinary: Negative for difficulty urinating.  Musculoskeletal: Positive for arthralgias. Negative for back pain and neck pain.  Skin: Negative for rash.  Neurological: Negative for syncope and headaches.      Allergies  Penicillins and Sulfa antibiotics  Home Medications   Prior to Admission medications   Medication Sig Start Date End Date Taking? Authorizing Provider  acetaminophen (TYLENOL) 500 MG tablet Take 500 mg by mouth at bedtime.   Yes Historical Provider, MD  aspirin EC 81 MG tablet Take 81 mg by mouth daily.   Yes Historical Provider, MD  cetirizine (ZYRTEC) 10 MG tablet Take 10 mg by mouth daily.   Yes Historical Provider, MD  DULoxetine (CYMBALTA) 20 MG capsule Take 1 capsule (20 mg total) by mouth daily. 03/05/15  Yes Mosie Lukes, MD  furosemide (LASIX) 20 MG tablet Take 1 tablet (20 mg total) by mouth daily. 03/05/15  Yes Mosie Lukes, MD  gabapentin (NEURONTIN) 300 MG capsule Take 1 capsule (300 mg total) by mouth at bedtime. 03/05/15  Yes Mosie Lukes, MD  HYDROcodone-acetaminophen (NORCO) 5-325 MG tablet Take 1 tablet by mouth  every 4 (four) hours as needed. Patient taking differently: Take 1 tablet by mouth every 4 (four) hours as needed for moderate pain or severe pain.  06/30/15  Yes Melina Schools, MD  levothyroxine (SYNTHROID, LEVOTHROID) 50 MCG tablet Take 1 tablet (50 mcg total) by mouth daily. 03/05/15  Yes Mosie Lukes, MD  liothyronine (CYTOMEL) 25 MCG tablet Take 1 tablet (25 mcg total) by mouth daily. 03/05/15  Yes Mosie Lukes, MD  methocarbamol (ROBAXIN) 500 MG tablet Take 1 tablet (500 mg total) by mouth 3 (three) times daily as needed for muscle spasms. 06/30/15   Melina Schools, MD  rivaroxaban (XARELTO) 10 MG TABS tablet Take 1 tablet (10 mg total) by mouth daily with breakfast. Take Xarelto for two and a half more weeks, then discontinue Xarelto. Once the patient has completed the blood thinner regimen, then take a Baby 81 mg Aspirin daily for three more weeks. Patient not taking: Reported on 07/16/2015 06/13/15   Arlee Muslim, PA-C   BP 155/76 mmHg  Pulse 88  Temp(Src) 98.4 F (36.9 C) (Oral)  Resp 16  SpO2 100% Physical Exam  Constitutional: She is oriented to person, place, and time. She appears well-developed and well-nourished. No distress.  HENT:  Head: Normocephalic and atraumatic.  Eyes: Conjunctivae and EOM are normal.  Neck: Normal range of motion.  Cardiovascular: Normal rate, regular rhythm, normal heart sounds and intact distal pulses.  Exam reveals no gallop and no friction rub.   No murmur heard. Pulmonary/Chest: Effort normal and breath sounds normal. No respiratory distress. She has no wheezes. She has no rales.  Abdominal: Soft. She exhibits no distension. There is no tenderness. There is no guarding.  Musculoskeletal: She exhibits no edema.       Right hip: She exhibits decreased range of motion and tenderness.  Right hip incision healed no surrounding erythema Right leg shortened and externally rotated   Neurological: She is alert and oriented to person, place, and time.   Skin: Skin is warm and dry. No rash noted. She is not diaphoretic. No erythema.  Nursing note and vitals reviewed.   ED Course  .Sedation Date/Time: 07/17/2015 7:46 AM Performed by: Gareth Morgan Authorized by: Gareth Morgan  Consent:    Consent obtained:  Written   Consent given by:  Patient   Risks discussed:  Allergic reaction, dysrhythmia, nausea, respiratory compromise necessitating ventilatory assistance and intubation, prolonged hypoxia resulting in organ damage and vomiting   Alternatives discussed:  Analgesia without sedation Indications:    Sedation purpose:  Dislocation reduction   Procedure necessitating sedation performed by:  Different physician   Intended level of sedation:  Moderate (conscious sedation) Pre-sedation assessment:    NPO status caution: urgency dictates proceeding with non-ideal NPO status     ASA classification: class 1 - normal, healthy patient  Neck mobility: normal     Mouth opening:  3 or more finger widths   Thyromental distance:  3 finger widths   Mallampati score:  II - soft palate, uvula, fauces visible   Pre-sedation assessments completed and reviewed: airway patency, cardiovascular function, hydration status, mental status, nausea/vomiting, pain level, respiratory function and temperature   Immediate pre-procedure details:    Reassessment: Patient reassessed immediately prior to procedure     Verified: bag valve mask available, emergency equipment available, intubation equipment available, IV patency confirmed, oxygen available and suction available   Procedure details (see MAR for exact dosages):    Sedation start time:  07/16/2015 7:21 PM   Preoxygenation:  Nasal cannula   Sedation:  Propofol   Analgesia:  Fentanyl   Intra-procedure monitoring:  Blood pressure monitoring, cardiac monitor, continuous capnometry, continuous pulse oximetry, frequent LOC assessments and frequent vital sign checks   Intra-procedure events: none      Sedation end time:  07/16/2015 7:29 PM   Total sedation time (minutes):  9 Post-procedure details:    Attendance: Constant attendance by certified staff until patient recovered     Recovery: Patient returned to pre-procedure baseline     Post-sedation assessments completed and reviewed: mental status, nausea/vomiting and pain level     Patient is stable for discharge or admission: Yes     Patient tolerance:  Tolerated well, no immediate complications  (including critical care time) Labs Review Labs Reviewed - No data to display  Imaging Review Dg Pelvis Portable  07/16/2015  CLINICAL DATA:  Status post closed tip manipulation. Initial encounter. EXAM: PORTABLE PELVIS 1-2 VIEWS COMPARISON:  Earlier today FINDINGS: The right hip arthroplasty has been reduced in the frontal projection. Located left hip arthroplasty. Negative for periprosthetic fracture. Osteopenia. IMPRESSION: Relocated right hip prosthesis. Electronically Signed   By: Monte Fantasia M.D.   On: 07/16/2015 19:37   Dg Hip Unilat With Pelvis 2-3 Views Right  07/16/2015  CLINICAL DATA:  The patient felt her right hip pop while standing in her kitchen today. Initial encounter. EXAM: DG HIP (WITH OR WITHOUT PELVIS) 2-3V RIGHT COMPARISON:  Plain films of the right hip 06/30/2015. FINDINGS: The patient has bilateral total hip replacements. The right prosthesis is posteriorly and superiorly dislocated. The left hip is in place. No fracture is identified. IMPRESSION: Posterior, superior right hip dislocation. Electronically Signed   By: Inge Rise M.D.   On: 07/16/2015 17:20   I have personally reviewed and evaluated these images and lab results as part of my medical decision-making.   EKG Interpretation None      MDM   Final diagnoses:  Hip dislocation, right, initial encounter [facility])   70 year old female with a history of right hip replacement with Dr.Alusio March 1, right hip dislocation with reduction in the operating room on  March 19, presents with concern for right hip pain and dislocation. Patient reports she was drinking a glass of water in the kitchen, when she suddenly felt a pop in her right hip. She did not fall to the ground. She is neurovascularly intact. Exam is concerning for right hip dislocation, and x-ray confirms this. Given patient went to the operating room for her prior hip dislocation, orthopedics was consulted. The right hip reduction was performed under propofol sedation. Patient is neurovascular intact and returned to baseline. She is to remain nonweightbearing in the immobilizer and follow-up with Dr. Maureen Ralphs as soon as possible. Patient discharged in stable condition with understanding of reasons to return.  Gareth Morgan, MD 07/17/15 [id number]

## 2015-07-16 NOTE — Consult Note (Signed)
Reason for Consult:hip dislocation Referring Physician: MANDI SHOLES Vogt-Nichols is an 70 y.o. female.  HPI: Patient reports to [facility] from right hip pain. She was standing in the kitchen today when she felt pain in her right hip. The total hip replacement was done a month ago by Dr. Wynelle Link. She has had one other dislocation 2 weeks ago. She had been doing well since until today. No new injuries. Xray confirmed dislocation. Husband at bedside.   Past Medical History  Diagnosis Date  . History of chicken pox   . Allergy     "seasonal allergies"  . Depression   . Hyperlipidemia   . Thyroid disease   . Abdominal pain 02/01/2015  . Scoliosis 02/01/2015  . URI, acute 03/10/2015  . 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 Right hip pain 05/24/2015  . Complication of anesthesia   . PONV (postoperative nausea and vomiting)   . Hypothyroidism     supplement used  . Frequent headaches     no problems now.  . Peripheral neuropathy ([place]) 02/01/2015    right hand"carpal tunnel"  . 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 Arthritis     osteoarthritis-"DDD" hips. fingers, toes.    Past Surgical History  Procedure Laterality Date  . Tubal ligation  1980    left fallopian tube removal  . 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 Metacarpophalangeal joint capsulectomy / capsulotomy  1995  . Total hip arthroplasty Right 06/12/2015    Procedure: RIGHT TOTAL HIP ARTHROPLASTY ANTERIOR APPROACH;  Surgeon: Gaynelle Arabian, MD;  Location: [facility];  Service: Orthopedics;  Laterality: Right;  . Hip closed reduction Right 06/30/2015    Procedure: CLOSED MANIPULATION HIP;  Surgeon: Melina Schools, MD;  Location: [facility];  Service: Orthopedics;  Laterality: Right;    Family History  Problem Relation Age of Onset  . Arthritis Mother   . Dementia Mother   . Mental illness Father     suicide  . Arthritis Maternal Grandmother   . Kidney disease Son     b/l  multicystic kidney disease s/p 4 transplants    Social History:  reports that she has never smoked. She does not have any smokeless tobacco history on file. She reports that she drinks alcohol. She reports that she does not use illicit drugs.  Allergies:  Allergies  Allergen Reactions  . Penicillins Hives    Has patient had a PCN reaction causing immediate rash, facial/tongue/throat swelling, SOB or lightheadedness with hypotension: No Has patient had a PCN reaction causing severe rash involving mucus membranes or skin necrosis: No Has patient had a PCN reaction that required hospitalization No Has patient had a PCN reaction occurring within the last 10 years: No If all of the above answers are "NO", then may proceed with Cephalosporin use.  . Sulfa Antibiotics Hives    Medications: I have reviewed the patient's current medications.  No results found for this or any previous visit (from the past 48 hour(s)).  Dg Hip Unilat With Pelvis 2-3 Views Right  07/16/2015  CLINICAL DATA:  The patient felt her right hip pop while standing in her kitchen today. Initial encounter. EXAM: DG HIP (WITH OR WITHOUT PELVIS) 2-3V RIGHT COMPARISON:  Plain films of the right hip 06/30/2015. FINDINGS: The patient has bilateral total hip replacements. The right prosthesis is posteriorly and superiorly dislocated. The left  hip is in place. No fracture is identified. IMPRESSION: Posterior, superior right hip dislocation. Electronically Signed   By: Inge Rise M.D.   On: 07/16/2015 17:20    Review of Systems  Constitutional: Negative.   HENT: Negative.   Eyes: Negative.   Respiratory: Negative.   Cardiovascular: Negative.   Gastrointestinal: Negative.   Genitourinary: Negative.   Musculoskeletal: Positive for joint pain.  Skin: Negative.   Neurological: Negative.   Endo/Heme/Allergies: Negative.   Psychiatric/Behavioral: Negative.    Blood pressure 155/76, pulse 88, temperature 98.4 F (36.9 C),  temperature source Oral, resp. rate 16, SpO2 100 %. Physical Exam  Constitutional: She is oriented to person, place, and time. She appears well-developed.  HENT:  Head: Normocephalic.  Eyes: EOM are normal.  Neck: Normal range of motion.  Cardiovascular: Normal rate and intact distal pulses.   GI: Soft.  Musculoskeletal:  Rigth hip healed incision. Tenderness to right hip. RLE shortened and externally rotated. RLE Grossly N/v intact.  Neurological: She is alert and oriented to person, place, and time.  Skin: Skin is warm and dry.  Psychiatric: Her behavior is normal.    Assessment/Plan: Right hip dislocation: Plan to Reduce in [facility]. Pt and family in agreement Knee immobilizer at all times NWB RLE F/u with DR. Aluisio this week  Procedure: Pt and family instructed about hip reduction. Both parties are in agreement. Consent signed. Time out was done. Dr.Scholssman and Ed staff managed VS and Sedation. Pt Right hip was identified. The hip was reduced with longitudinal traction and internal rotation. Confirmed by xray.  Dr. Lyla Glassing performed the reduction and I assisted. Knee immobilizer placed. PT awoke without difficulty. VSS. RLE grossly n/v intact. 2+ pedal pulse. Husband updated. Questions encouraged and answered in detail.   STILWELL, BRYSON L 07/16/2015, 7:34 PM

## 2015-07-16 NOTE — ED Notes (Signed)
Per EMS, patient is from home.  Patient is complaining of right hip dislocation.  Patient states she did not fall but feels like her hip is dislocated.  Patient states she had right hip replacement 06-12-15.    BP:133/69 HR:83 R:20

## 2015-07-16 NOTE — Discharge Instructions (Signed)
Hip Dislocation °Hip dislocation is the displacement of the "ball" at the head of your thigh bone (femur) from its socket in the hip bone (pelvis). The ball-and-socket structure of the hip joint gives it a lot of stability, while allowing it to move freely. Therefore, a lot of force is required to displace the femur from its socket. A hip dislocation is an emergency. If you believe you have dislocated your hip and cannot move your leg, call for help immediately. Do not try to move. °CAUSES °The most common cause of hip dislocation is motor vehicle accidents. However, force from falls from a height (a ladder or building), injuries from contact sports, or injuries from industrial accidents can be enough to dislocate your hip. °SYMPTOMS °A hip dislocation is very painful. If you have a dislocated hip, you will not be able to move your hip. If you have nerve damage, you may not have feeling in your lower leg, foot, or ankle.  °DIAGNOSIS °Usually, your caregiver can diagnose a hip dislocation by looking at the position of your leg. Generally, X-ray exams are done to check for fractures in your femur or pelvis. The leg of the dislocated hip will appear shorter than the other leg, and your foot will be turned inward. °TREATMENT  °Your caregiver can manipulate your bones back into the joint (reduction). If there are no other complications involved with your dislocation, such as fractures or damage to blood vessels or nerves, this procedure can be done without surgery. Before this procedure, you will be given medicine so that you will not feel pain (anesthetic). Often specialized imaging exams are done after the reduction (magnetic resonance imaging [MRI] or computed tomography [CT]) to check for loose pieces of cartilage or bone in the joint. °If a manual reduction fails or you have nerve damage, damage to your blood vessels, or bone fractures, surgery will be necessary to perform the reduction.  °HOME CARE  INSTRUCTIONS °The following measures can help to reduce pain and speed up the healing process: °· Rest your injured joint. Do not move your joint if it is painful. Also, avoid activities similar to the one that caused your injury. °· Apply ice to your injured joint for 1 to 2 days after your reduction or as directed by your caregiver. Applying ice helps to reduce inflammation and pain. °¨ Put ice in a plastic bag. °¨ Place a towel between your skin and the bag. °¨ Leave the ice on for 15 to 20 minutes at a time, every 2 hours while you are awake. °· Use crutches or a walker as directed by your caregiver. °· Exercise your hip and leg as directed by your caregiver. °· Take over-the-counter or prescription medicine for pain as directed by your caregiver. °SEEK IMMEDIATE MEDICAL CARE IF: °· Your pain becomes worse rather than better. °· You feel like your hip has become dislocated again. °MAKE SURE YOU: °· Understand these instructions. °· Will watch your condition. °· Will get help right away if you are not doing well or get worse. °  °This information is not intended to replace advice given to you by your health care provider. Make sure you discuss any questions you have with your health care provider. °  °Document Released: 12/23/2000 Document Revised: 04/20/2014 Document Reviewed: 10/30/2014 °Elsevier Interactive Patient Education ©2016 [employer] ° °

## 2015-07-16 NOTE — ED Notes (Signed)
Bed: WA11 Expected date:  Expected time:  Means of arrival:  Comments: EMS-fall 

## 2015-07-16 NOTE — ED Notes (Signed)
Patient denies pain and is resting comfortably.  

## 2015-07-18 ENCOUNTER — Encounter (HOSPITAL_COMMUNITY): Payer: Self-pay | Admitting: *Deleted

## 2015-07-18 ENCOUNTER — Ambulatory Visit: Payer: Self-pay | Admitting: Orthopedic Surgery

## 2015-07-18 DIAGNOSIS — Z471 Aftercare following joint replacement surgery: Secondary | ICD-10-CM | POA: Diagnosis not present

## 2015-07-18 DIAGNOSIS — Z96641 Presence of right artificial hip joint: Secondary | ICD-10-CM | POA: Diagnosis not present

## 2015-07-18 NOTE — Progress Notes (Signed)
Preoperative surgical orders have been place into the Epic hospital system for [place] on 07/18/2015, 3:16 PM  by Mickel Crow for surgery on 07/19/15.  Preop Total Hip - Anterior Approach orders including IV Tylenol, and IV Decadron as long as there are no contraindications to the above medications. Arlee Muslim, PA-C

## 2015-07-19 ENCOUNTER — Observation Stay (HOSPITAL_COMMUNITY)
Admission: RE | Admit: 2015-07-19 | Discharge: 2015-07-20 | Disposition: A | Discharge status: Home / Self Care | Payer: Medicare Other | Source: Ambulatory Visit | Attending: Orthopedic Surgery | Admitting: Orthopedic Surgery

## 2015-07-19 ENCOUNTER — Inpatient Hospital Stay (HOSPITAL_COMMUNITY): Payer: Medicare Other | Admitting: Anesthesiology

## 2015-07-19 ENCOUNTER — Inpatient Hospital Stay (HOSPITAL_COMMUNITY): Payer: Medicare Other

## 2015-07-19 ENCOUNTER — Encounter (HOSPITAL_COMMUNITY): Payer: Self-pay | Admitting: *Deleted

## 2015-07-19 ENCOUNTER — Encounter (HOSPITAL_COMMUNITY)
Admission: RE | Disposition: A | Discharge status: Home / Self Care | Payer: Self-pay | Source: Ambulatory Visit | Attending: Orthopedic Surgery

## 2015-07-19 DIAGNOSIS — G629 Polyneuropathy, unspecified: Secondary | ICD-10-CM | POA: Insufficient documentation

## 2015-07-19 DIAGNOSIS — M19049 Primary osteoarthritis, unspecified hand: Secondary | ICD-10-CM | POA: Diagnosis not present

## 2015-07-19 DIAGNOSIS — I739 Peripheral vascular disease, unspecified: Secondary | ICD-10-CM | POA: Insufficient documentation

## 2015-07-19 DIAGNOSIS — M160 Bilateral primary osteoarthritis of hip: Secondary | ICD-10-CM | POA: Diagnosis not present

## 2015-07-19 DIAGNOSIS — T84020A Dislocation of internal right hip prosthesis, initial encounter: Principal | ICD-10-CM | POA: Insufficient documentation

## 2015-07-19 DIAGNOSIS — Z79891 Long term (current) use of opiate analgesic: Secondary | ICD-10-CM | POA: Insufficient documentation

## 2015-07-19 DIAGNOSIS — Z419 Encounter for procedure for purposes other than remedying health state, unspecified: Secondary | ICD-10-CM

## 2015-07-19 DIAGNOSIS — Z96641 Presence of right artificial hip joint: Secondary | ICD-10-CM | POA: Diagnosis not present

## 2015-07-19 DIAGNOSIS — T84028A Dislocation of other internal joint prosthesis, initial encounter: Secondary | ICD-10-CM

## 2015-07-19 DIAGNOSIS — Z471 Aftercare following joint replacement surgery: Secondary | ICD-10-CM | POA: Diagnosis not present

## 2015-07-19 DIAGNOSIS — Z96642 Presence of left artificial hip joint: Secondary | ICD-10-CM | POA: Diagnosis not present

## 2015-07-19 DIAGNOSIS — F329 Major depressive disorder, single episode, unspecified: Secondary | ICD-10-CM | POA: Insufficient documentation

## 2015-07-19 DIAGNOSIS — E039 Hypothyroidism, unspecified: Secondary | ICD-10-CM | POA: Insufficient documentation

## 2015-07-19 DIAGNOSIS — Z96649 Presence of unspecified artificial hip joint: Secondary | ICD-10-CM

## 2015-07-19 DIAGNOSIS — Z79899 Other long term (current) drug therapy: Secondary | ICD-10-CM | POA: Insufficient documentation

## 2015-07-19 DIAGNOSIS — T84020D Dislocation of internal right hip prosthesis, subsequent encounter: Secondary | ICD-10-CM | POA: Diagnosis not present

## 2015-07-19 DIAGNOSIS — J302 Other seasonal allergic rhinitis: Secondary | ICD-10-CM | POA: Insufficient documentation

## 2015-07-19 DIAGNOSIS — M19079 Primary osteoarthritis, unspecified ankle and foot: Secondary | ICD-10-CM | POA: Diagnosis not present

## 2015-07-19 DIAGNOSIS — Z7982 Long term (current) use of aspirin: Secondary | ICD-10-CM | POA: Insufficient documentation

## 2015-07-19 DIAGNOSIS — Z96643 Presence of artificial hip joint, bilateral: Secondary | ICD-10-CM | POA: Insufficient documentation

## 2015-07-19 DIAGNOSIS — M24451 Recurrent dislocation, right hip: Secondary | ICD-10-CM | POA: Diagnosis not present

## 2015-07-19 HISTORY — PX: TOTAL HIP REVISION: SHX763

## 2015-07-19 HISTORY — DX: Unspecified dislocation of right hip, initial encounter: S73.004A

## 2015-07-19 LAB — COMPREHENSIVE METABOLIC PANEL
ALK PHOS: 87 U/L (ref 38–126)
ALT: 14 U/L (ref 14–54)
AST: 21 U/L (ref 15–41)
Albumin: 4.1 g/dL (ref 3.5–5.0)
Anion gap: 9 (ref 5–15)
BUN: 11 mg/dL (ref 6–20)
CALCIUM: 10.2 mg/dL (ref 8.9–10.3)
CO2: 28 mmol/L (ref 22–32)
CREATININE: 0.66 mg/dL (ref 0.44–1.00)
Chloride: 103 mmol/L (ref 101–111)
GFR calc Af Amer: >60 mL/min (ref >60)
Glucose, Bld: 88 mg/dL (ref 65–99)
Potassium: 3.9 mmol/L (ref 3.5–5.1)
SODIUM: 140 mmol/L (ref 135–145)
Total Bilirubin: 0.3 mg/dL (ref 0.3–1.2)
Total Protein: 6.6 g/dL (ref 6.5–8.1)

## 2015-07-19 LAB — CBC
HCT: 36 % (ref 36.0–46.0)
Hemoglobin: 11.5 g/dL — ABNORMAL LOW (ref 12.0–15.0)
MCH: 26.6 pg (ref 26.0–34.0)
MCHC: 31.9 g/dL (ref 30.0–36.0)
MCV: 83.3 fL (ref 78.0–100.0)
PLATELETS: 270 10*3/uL (ref 150–400)
RBC: 4.32 MIL/uL (ref 3.87–5.11)
RDW: 14.3 % (ref 11.5–15.5)
WBC: 6.7 10*3/uL (ref 4.0–10.5)

## 2015-07-19 LAB — URINALYSIS, ROUTINE W REFLEX MICROSCOPIC
Bilirubin Urine: NEGATIVE
Glucose, UA: NEGATIVE mg/dL
Hgb urine dipstick: NEGATIVE
Ketones, ur: NEGATIVE mg/dL
NITRITE: NEGATIVE
PROTEIN: NEGATIVE mg/dL
Specific Gravity, Urine: 1.02 (ref 1.005–1.030)
pH: 6.5 (ref 5.0–8.0)

## 2015-07-19 LAB — TYPE AND SCREEN
ABO/RH(D): AB POS
Antibody Screen: NEGATIVE

## 2015-07-19 LAB — SURGICAL PCR SCREEN
MRSA, PCR: NEGATIVE
Staphylococcus aureus: NEGATIVE

## 2015-07-19 LAB — PROTIME-INR
INR: 1.03 (ref 0.00–1.49)
Prothrombin Time: 13.3 seconds (ref 11.6–15.2)

## 2015-07-19 LAB — URINE MICROSCOPIC-ADD ON

## 2015-07-19 LAB — APTT: aPTT: 26 seconds (ref 24–37)

## 2015-07-19 SURGERY — TOTAL HIP REVISION
Anesthesia: General | Site: Hip | Laterality: Right

## 2015-07-19 MED ORDER — PROPOFOL 10 MG/ML IV BOLUS
INTRAVENOUS | Status: AC
  Filled 2015-07-19: qty 20

## 2015-07-19 MED ORDER — METHOCARBAMOL 500 MG PO TABS
500.0000 mg | ORAL_TABLET | Freq: Four times a day (QID) | ORAL | Status: DC | PRN
  Administered 2015-07-20 (×2): 500 mg via ORAL
  Filled 2015-07-19 (×3): qty 1

## 2015-07-19 MED ORDER — MORPHINE SULFATE (PF) 2 MG/ML IV SOLN
1.0000 mg | INTRAVENOUS | Status: DC | PRN
  Administered 2015-07-19 (×2): 1 mg via INTRAVENOUS
  Filled 2015-07-19 (×2): qty 1

## 2015-07-19 MED ORDER — PROPOFOL 10 MG/ML IV BOLUS
INTRAVENOUS | Status: DC | PRN
  Administered 2015-07-19: 10 mg via INTRAVENOUS
  Administered 2015-07-19: 100 mg via INTRAVENOUS
  Administered 2015-07-19 (×3): 10 mg via INTRAVENOUS

## 2015-07-19 MED ORDER — CHLORHEXIDINE GLUCONATE 4 % EX LIQD: 60.0000 mL | Freq: Once | CUTANEOUS | Status: DC

## 2015-07-19 MED ORDER — LORATADINE 10 MG PO TABS
10.0000 mg | ORAL_TABLET | Freq: Every day | ORAL | Status: DC
  Administered 2015-07-20: 10 mg via ORAL
  Filled 2015-07-19: qty 1

## 2015-07-19 MED ORDER — ONDANSETRON HCL 4 MG/2ML IJ SOLN
4.0000 mg | Freq: Four times a day (QID) | INTRAMUSCULAR | Status: DC | PRN
  Administered 2015-07-19: 4 mg via INTRAVENOUS
  Filled 2015-07-19: qty 2

## 2015-07-19 MED ORDER — GABAPENTIN 300 MG PO CAPS
300.0000 mg | ORAL_CAPSULE | Freq: Every day | ORAL | Status: DC
  Administered 2015-07-19: 300 mg via ORAL
  Filled 2015-07-19 (×2): qty 1

## 2015-07-19 MED ORDER — FENTANYL CITRATE (PF) 250 MCG/5ML IJ SOLN
INTRAMUSCULAR | Status: DC | PRN
  Administered 2015-07-19 (×3): 50 ug via INTRAVENOUS
  Administered 2015-07-19: 25 ug via INTRAVENOUS
  Administered 2015-07-19: 50 ug via INTRAVENOUS
  Administered 2015-07-19: 25 ug via INTRAVENOUS

## 2015-07-19 MED ORDER — CEFAZOLIN SODIUM-DEXTROSE 2-4 GM/100ML-% IV SOLN
2.0000 g | INTRAVENOUS | Status: AC
  Administered 2015-07-19: 2 g via INTRAVENOUS

## 2015-07-19 MED ORDER — ACETAMINOPHEN 10 MG/ML IV SOLN
INTRAVENOUS | Status: AC
  Filled 2015-07-19: qty 100

## 2015-07-19 MED ORDER — LACTATED RINGERS IV SOLN
INTRAVENOUS | Status: DC | PRN
  Administered 2015-07-19 (×2): via INTRAVENOUS

## 2015-07-19 MED ORDER — ONDANSETRON HCL 4 MG PO TABS
4.0000 mg | ORAL_TABLET | Freq: Four times a day (QID) | ORAL | Status: DC | PRN
Start: 2015-07-19 — End: 2015-07-20
  Filled 2015-07-19: qty 1

## 2015-07-19 MED ORDER — FENTANYL CITRATE (PF) 100 MCG/2ML IJ SOLN
INTRAMUSCULAR | Status: AC
  Filled 2015-07-19: qty 2

## 2015-07-19 MED ORDER — DIPHENHYDRAMINE HCL 12.5 MG/5ML PO ELIX
12.5000 mg | ORAL_SOLUTION | ORAL | Status: DC | PRN
Start: 2015-07-19 — End: 2015-07-20

## 2015-07-19 MED ORDER — LIOTHYRONINE SODIUM 25 MCG PO TABS
25.0000 ug | ORAL_TABLET | Freq: Every day | ORAL | Status: DC
  Administered 2015-07-20: 25 ug via ORAL
  Filled 2015-07-19: qty 1

## 2015-07-19 MED ORDER — CEFAZOLIN SODIUM 1-5 GM-% IV SOLN
1.0000 g | Freq: Four times a day (QID) | INTRAVENOUS | Status: AC
  Administered 2015-07-19 – 2015-07-20 (×2): 1 g via INTRAVENOUS
  Filled 2015-07-19 (×2): qty 50

## 2015-07-19 MED ORDER — TRANEXAMIC ACID 1000 MG/10ML IV SOLN
1000.0000 mg | Freq: Once | INTRAVENOUS | Status: AC
  Administered 2015-07-19: 1000 mg via INTRAVENOUS
  Filled 2015-07-19: qty 10

## 2015-07-19 MED ORDER — DEXAMETHASONE SODIUM PHOSPHATE 10 MG/ML IJ SOLN
10.0000 mg | Freq: Once | INTRAMUSCULAR | Status: AC
  Administered 2015-07-19: 10 mg via INTRAVENOUS

## 2015-07-19 MED ORDER — POLYETHYLENE GLYCOL 3350 17 G PO PACK: 17.0000 g | PACK | Freq: Every day | ORAL | Status: DC | PRN

## 2015-07-19 MED ORDER — DOCUSATE SODIUM 100 MG PO CAPS
100.0000 mg | ORAL_CAPSULE | Freq: Two times a day (BID) | ORAL | Status: DC
  Administered 2015-07-19 – 2015-07-20 (×2): 100 mg via ORAL

## 2015-07-19 MED ORDER — MIDAZOLAM HCL 2 MG/2ML IJ SOLN
INTRAMUSCULAR | Status: AC
  Filled 2015-07-19: qty 2

## 2015-07-19 MED ORDER — METOCLOPRAMIDE HCL 10 MG PO TABS: 5.0000 mg | ORAL_TABLET | Freq: Three times a day (TID) | ORAL | Status: DC | PRN

## 2015-07-19 MED ORDER — FLEET ENEMA 7-19 GM/118ML RE ENEM: 1.0000 | ENEMA | Freq: Once | RECTAL | Status: DC | PRN

## 2015-07-19 MED ORDER — FENTANYL CITRATE (PF) 100 MCG/2ML IJ SOLN
25.0000 ug | INTRAMUSCULAR | Status: DC | PRN
  Administered 2015-07-19 (×2): 50 ug via INTRAVENOUS

## 2015-07-19 MED ORDER — ACETAMINOPHEN 10 MG/ML IV SOLN
1000.0000 mg | Freq: Once | INTRAVENOUS | Status: AC
  Administered 2015-07-19: 1000 mg via INTRAVENOUS

## 2015-07-19 MED ORDER — ONDANSETRON HCL 4 MG/2ML IJ SOLN: 4.0000 mg | Freq: Once | INTRAMUSCULAR | Status: DC | PRN

## 2015-07-19 MED ORDER — DEXAMETHASONE SODIUM PHOSPHATE 10 MG/ML IJ SOLN
INTRAMUSCULAR | Status: AC
  Filled 2015-07-19: qty 1

## 2015-07-19 MED ORDER — BUPIVACAINE HCL (PF) 0.25 % IJ SOLN
INTRAMUSCULAR | Status: AC
  Filled 2015-07-19: qty 30

## 2015-07-19 MED ORDER — HYDROMORPHONE HCL 1 MG/ML IJ SOLN
0.5000 mg | INTRAMUSCULAR | Status: AC | PRN
  Administered 2015-07-19 (×4): 0.5 mg via INTRAVENOUS

## 2015-07-19 MED ORDER — CEFAZOLIN SODIUM-DEXTROSE 2-4 GM/100ML-% IV SOLN
INTRAVENOUS | Status: AC
  Filled 2015-07-19: qty 100

## 2015-07-19 MED ORDER — MENTHOL 3 MG MT LOZG: 1.0000 | LOZENGE | OROMUCOSAL | Status: DC | PRN

## 2015-07-19 MED ORDER — EPHEDRINE SULFATE 50 MG/ML IJ SOLN
INTRAMUSCULAR | Status: DC | PRN
  Administered 2015-07-19: 2.5 mg via INTRAVENOUS

## 2015-07-19 MED ORDER — DEXTROSE 5 % IV SOLN
500.0000 mg | Freq: Four times a day (QID) | INTRAVENOUS | Status: DC | PRN
  Filled 2015-07-19: qty 5

## 2015-07-19 MED ORDER — LEVOTHYROXINE SODIUM 50 MCG PO TABS
50.0000 ug | ORAL_TABLET | Freq: Every day | ORAL | Status: DC
  Administered 2015-07-20: 50 ug via ORAL
  Filled 2015-07-19 (×2): qty 1

## 2015-07-19 MED ORDER — LIDOCAINE HCL (CARDIAC) 20 MG/ML IV SOLN
INTRAVENOUS | Status: AC
  Filled 2015-07-19: qty 5

## 2015-07-19 MED ORDER — DULOXETINE HCL 20 MG PO CPEP
20.0000 mg | ORAL_CAPSULE | Freq: Every day | ORAL | Status: DC
  Administered 2015-07-20: 20 mg via ORAL
  Filled 2015-07-19: qty 1

## 2015-07-19 MED ORDER — NEOSTIGMINE METHYLSULFATE 10 MG/10ML IV SOLN
INTRAVENOUS | Status: DC | PRN
  Administered 2015-07-19: 3 mg via INTRAVENOUS

## 2015-07-19 MED ORDER — METOCLOPRAMIDE HCL 5 MG/ML IJ SOLN
5.0000 mg | Freq: Three times a day (TID) | INTRAMUSCULAR | Status: DC | PRN
Start: 2015-07-19 — End: 2015-07-20

## 2015-07-19 MED ORDER — ASPIRIN EC 325 MG PO TBEC
325.0000 mg | DELAYED_RELEASE_TABLET | Freq: Every day | ORAL | Status: DC
  Administered 2015-07-20: 325 mg via ORAL
  Filled 2015-07-19 (×2): qty 1

## 2015-07-19 MED ORDER — DEXAMETHASONE SODIUM PHOSPHATE 10 MG/ML IJ SOLN
10.0000 mg | Freq: Once | INTRAMUSCULAR | Status: AC
  Administered 2015-07-20: 10 mg via INTRAVENOUS
  Filled 2015-07-19: qty 1

## 2015-07-19 MED ORDER — BISACODYL 10 MG RE SUPP: 10.0000 mg | Freq: Every day | RECTAL | Status: DC | PRN

## 2015-07-19 MED ORDER — MIDAZOLAM HCL 5 MG/5ML IJ SOLN
INTRAMUSCULAR | Status: DC | PRN
  Administered 2015-07-19 (×2): 1 mg via INTRAVENOUS

## 2015-07-19 MED ORDER — ACETAMINOPHEN 325 MG PO TABS
650.0000 mg | ORAL_TABLET | Freq: Four times a day (QID) | ORAL | Status: DC | PRN
  Administered 2015-07-20: 650 mg via ORAL
  Filled 2015-07-19: qty 2

## 2015-07-19 MED ORDER — HYDROMORPHONE HCL 1 MG/ML IJ SOLN
INTRAMUSCULAR | Status: AC
  Filled 2015-07-19: qty 1

## 2015-07-19 MED ORDER — ONDANSETRON HCL 4 MG/2ML IJ SOLN
INTRAMUSCULAR | Status: AC
  Filled 2015-07-19: qty 2

## 2015-07-19 MED ORDER — OXYCODONE HCL 5 MG PO TABS
5.0000 mg | ORAL_TABLET | ORAL | Status: DC | PRN
  Administered 2015-07-20 (×4): 10 mg via ORAL
  Filled 2015-07-19 (×4): qty 2

## 2015-07-19 MED ORDER — ROCURONIUM BROMIDE 100 MG/10ML IV SOLN
INTRAVENOUS | Status: DC | PRN
  Administered 2015-07-19: 30 mg via INTRAVENOUS

## 2015-07-19 MED ORDER — ACETAMINOPHEN 650 MG RE SUPP: 650.0000 mg | Freq: Four times a day (QID) | RECTAL | Status: DC | PRN

## 2015-07-19 MED ORDER — FENTANYL CITRATE (PF) 250 MCG/5ML IJ SOLN
INTRAMUSCULAR | Status: AC
  Filled 2015-07-19: qty 5

## 2015-07-19 MED ORDER — PHENOL 1.4 % MT LIQD: 1.0000 | OROMUCOSAL | Status: DC | PRN

## 2015-07-19 MED ORDER — FUROSEMIDE 20 MG PO TABS
20.0000 mg | ORAL_TABLET | Freq: Every day | ORAL | Status: DC
  Administered 2015-07-20: 20 mg via ORAL
  Filled 2015-07-19: qty 1

## 2015-07-19 MED ORDER — SODIUM CHLORIDE 0.9 % IV SOLN: INTRAVENOUS | Status: DC

## 2015-07-19 MED ORDER — BUPIVACAINE HCL 0.25 % IJ SOLN
INTRAMUSCULAR | Status: DC | PRN
  Administered 2015-07-19: 30 mL

## 2015-07-19 MED ORDER — 0.9 % SODIUM CHLORIDE (POUR BTL) OPTIME
TOPICAL | Status: DC | PRN
  Administered 2015-07-19: 1000 mL

## 2015-07-19 MED ORDER — SODIUM CHLORIDE 0.9 % IV SOLN
INTRAVENOUS | Status: DC
  Administered 2015-07-19: 21:00:00 via INTRAVENOUS

## 2015-07-19 MED ORDER — ONDANSETRON HCL 4 MG/2ML IJ SOLN
INTRAMUSCULAR | Status: DC | PRN
  Administered 2015-07-19: 4 mg via INTRAVENOUS

## 2015-07-19 MED ORDER — GLYCOPYRROLATE 0.2 MG/ML IJ SOLN
INTRAMUSCULAR | Status: DC | PRN
  Administered 2015-07-19: .5 mg via INTRAVENOUS

## 2015-07-19 MED ORDER — FENTANYL CITRATE (PF) 100 MCG/2ML IJ SOLN
INTRAMUSCULAR | Status: DC | PRN
  Administered 2015-07-19 (×2): 50 ug via INTRAVENOUS

## 2015-07-19 SURGICAL SUPPLY — 70 items
BAG DECANTER FOR FLEXI CONT (MISCELLANEOUS) ×1 IMPLANT
BAG SPEC THK2 15X12 ZIP CLS (MISCELLANEOUS) ×3
BAG ZIPLOCK 12X15 (MISCELLANEOUS) ×9 IMPLANT
BALL HIP CERAMIC (Hips) IMPLANT
BIT DRILL 2.8X128 (BIT) ×1 IMPLANT
BIT DRILL 2.8X128MM (BIT)
BLADE EXTENDED COATED 6.5IN (ELECTRODE) ×1 IMPLANT
BLADE SAW SAG 73X25 THK (BLADE)
BLADE SAW SGTL 73X25 THK (BLADE) ×1 IMPLANT
BRUSH FEMORAL CANAL (MISCELLANEOUS) IMPLANT
CLOSURE WOUND 1/2 X4 (GAUZE/BANDAGES/DRESSINGS) ×1
DRAPE INCISE IOBAN 66X45 STRL (DRAPES) ×3 IMPLANT
DRAPE ORTHO SPLIT 77X108 STRL (DRAPES) ×6
DRAPE POUCH INSTRU U-SHP 10X18 (DRAPES) ×3 IMPLANT
DRAPE SURG ORHT 6 SPLT 77X108 (DRAPES) ×2 IMPLANT
DRAPE U-SHAPE 47X51 STRL (DRAPES) ×7 IMPLANT
DRSG ADAPTIC 3X8 NADH LF (GAUZE/BANDAGES/DRESSINGS) ×2 IMPLANT
DRSG EMULSION OIL 3X16 NADH (GAUZE/BANDAGES/DRESSINGS) ×3 IMPLANT
DRSG MEPILEX BORDER 4X4 (GAUZE/BANDAGES/DRESSINGS) ×4 IMPLANT
DRSG MEPILEX BORDER 4X8 (GAUZE/BANDAGES/DRESSINGS) ×3 IMPLANT
DURAPREP 26ML APPLICATOR (WOUND CARE) ×3 IMPLANT
ELECT REM PT RETURN 9FT ADLT (ELECTROSURGICAL) ×3
ELECTRODE REM PT RTRN 9FT ADLT (ELECTROSURGICAL) ×1 IMPLANT
EVACUATOR 1/8 PVC DRAIN (DRAIN) ×3 IMPLANT
FACESHIELD WRAPAROUND (MASK) ×15 IMPLANT
FACESHIELD WRAPAROUND OR TEAM (MASK) ×4 IMPLANT
GAUZE SPONGE 4X4 12PLY STRL (GAUZE/BANDAGES/DRESSINGS) ×3 IMPLANT
GLOVE BIO SURGEON STRL SZ7.5 (GLOVE) ×3 IMPLANT
GLOVE BIO SURGEON STRL SZ8 (GLOVE) ×3 IMPLANT
GLOVE BIOGEL PI IND STRL 8 (GLOVE) ×3 IMPLANT
GLOVE BIOGEL PI INDICATOR 8 (GLOVE) ×6
GLOVE SURG SS PI 6.5 STRL IVOR (GLOVE) ×6 IMPLANT
GOWN STRL REUS W/TWL LRG LVL3 (GOWN DISPOSABLE) ×6 IMPLANT
GOWN STRL REUS W/TWL XL LVL3 (GOWN DISPOSABLE) ×3 IMPLANT
HANDPIECE INTERPULSE COAX TIP (DISPOSABLE)
HIP BALL CERAMIC (Hips) ×3 IMPLANT
IMMOBILIZER KNEE 20 (SOFTGOODS)
IMMOBILIZER KNEE 20 THIGH 36 (SOFTGOODS) IMPLANT
LINER MARATHON 32MM 48MM (Hips) ×2 IMPLANT
MANIFOLD NEPTUNE II (INSTRUMENTS) ×3 IMPLANT
MARKER SKIN DUAL TIP RULER LAB (MISCELLANEOUS) ×1 IMPLANT
NDL SAFETY ECLIPSE 18X1.5 (NEEDLE) ×1 IMPLANT
NEEDLE HYPO 18GX1.5 SHARP (NEEDLE) ×3
NS IRRIG 1000ML POUR BTL (IV SOLUTION) ×3 IMPLANT
PADDING CAST COTTON 6X4 STRL (CAST SUPPLIES) ×3 IMPLANT
PASSER SUT SWANSON 36MM LOOP (INSTRUMENTS) ×1 IMPLANT
POSITIONER SURGICAL ARM (MISCELLANEOUS) ×3 IMPLANT
PRESSURIZER FEMORAL UNIV (MISCELLANEOUS) IMPLANT
SET HNDPC FAN SPRY TIP SCT (DISPOSABLE) IMPLANT
SPONGE LAP 18X18 X RAY DECT (DISPOSABLE) ×3 IMPLANT
STAPLER VISISTAT 35W (STAPLE) ×1 IMPLANT
STRIP CLOSURE SKIN 1/2X4 (GAUZE/BANDAGES/DRESSINGS) ×1 IMPLANT
SUCTION FRAZIER HANDLE 10FR (MISCELLANEOUS)
SUCTION TUBE FRAZIER 10FR DISP (MISCELLANEOUS) ×1 IMPLANT
SUT ETHIBOND NAB CT1 #1 30IN (SUTURE) ×6 IMPLANT
SUT VIC AB 1 CT1 27 (SUTURE) ×9
SUT VIC AB 1 CT1 27XBRD ANTBC (SUTURE) ×3 IMPLANT
SUT VIC AB 2-0 CT1 27 (SUTURE) ×9
SUT VIC AB 2-0 CT1 TAPERPNT 27 (SUTURE) ×3 IMPLANT
SUT VLOC 180 0 24IN GS25 (SUTURE) ×6 IMPLANT
SWAB COLLECTION DEVICE MRSA (MISCELLANEOUS) ×1 IMPLANT
SWAB CULTURE ESWAB REG 1ML (MISCELLANEOUS) IMPLANT
SYR 50ML LL SCALE MARK (SYRINGE) ×3 IMPLANT
TOWEL OR 17X26 10 PK STRL BLUE (TOWEL DISPOSABLE) ×6 IMPLANT
TOWER CARTRIDGE SMART MIX (DISPOSABLE) IMPLANT
TRAY FOLEY W/METER SILVER 14FR (SET/KITS/TRAYS/PACK) ×3 IMPLANT
TRAY FOLEY W/METER SILVER 16FR (SET/KITS/TRAYS/PACK) ×1 IMPLANT
TUBE KAMVAC SUCTION (TUBING) IMPLANT
WATER STERILE IRR 1500ML POUR (IV SOLUTION) ×5 IMPLANT
YANKAUER SUCT BULB TIP 10FT TU (MISCELLANEOUS) ×3 IMPLANT

## 2015-07-19 NOTE — Anesthesia Postprocedure Evaluation (Signed)
Anesthesia Post Note  Patient: Misty Barker  Procedure(s) Performed: Procedure(s) (LRB): RIGHT HIP BEARING SURFACE REVISION (Right)  Patient location during evaluation: PACU Anesthesia Type: General Level of consciousness: awake, sedated, oriented and patient cooperative Pain management: pain level not controlled Vital Signs Assessment: post-procedure vital signs reviewed and stable Respiratory status: spontaneous breathing and respiratory function stable Cardiovascular status: blood pressure returned to baseline and stable Anesthetic complications: no    Last Vitals:  Filed Vitals:   07/19/15 2000 07/19/15 2023  BP: 126/64 131/56  Pulse: 77 83  Temp: 36.4 C 36.3 C  Resp: 10 18    Last Pain:  Filed Vitals:   07/19/15 2023  PainSc: 10-Worst pain ever                 Ardit Danh EDWARD

## 2015-07-19 NOTE — Brief Op Note (Signed)
07/19/2015  6:40 PM  PATIENT:  Misty Barker  70 y.o. female  PRE-OPERATIVE DIAGNOSIS:  RIGHT TOTAL HIP DISLOCATION  POST-OPERATIVE DIAGNOSIS:  RECURRENT RIGHT HIP DISLOCATION  PROCEDURE:  Procedure(s): RIGHT HIP BEARING SURFACE REVISION (Right)  SURGEON:  Surgeon(s) and Role:    * Gaynelle Arabian, MD - Primary  PHYSICIAN ASSISTANT:   ASSISTANTS: Claudell Kyle, PA-C   ANESTHESIA:   general  EBL:  Total I/O In: -  Out: 300 [Blood:300]  BLOOD ADMINISTERED:none  DRAINS: (Medium) Hemovact drain(s) in the right hip with  Suction Open   LOCAL MEDICATIONS USED:  MARCAINE     COUNTS:  YES  TOURNIQUET:  * No tourniquets in log *  DICTATION: .Other Dictation: Dictation Number [id number]  PLAN OF CARE: Admit for overnight observation  PATIENT DISPOSITION:  PACU - hemodynamically stable.

## 2015-07-19 NOTE — Interval H&P Note (Signed)
History and Physical Interval Note:  07/19/2015 5:02 PM  Misty Barker  has presented today for surgery, with the diagnosis of RIGHT TOTAL HIP DISLOCATION  The various methods of treatment have been discussed with the patient and family. After consideration of risks, benefits and other options for treatment, the patient has consented to  Procedure(s): RIGHT TOTAL HIP REVISION (Right) as a surgical intervention .  The patient's history has been reviewed, patient examined, no change in status, stable for surgery.  I have reviewed the patient's chart and labs.  Questions were answered to the patient's satisfaction.     Gearlean Alf

## 2015-07-19 NOTE — Anesthesia Preprocedure Evaluation (Signed)
Anesthesia Evaluation  Patient identified by MRN, date of birth, ID band Patient awake    Reviewed: Allergy & Precautions, NPO status , Patient's Chart, lab work & pertinent test results  History of Anesthesia Complications (+) PONV  Airway Mallampati: II  TM Distance: >3 FB Neck ROM: Full    Dental   Pulmonary    Pulmonary exam normal        Cardiovascular + Peripheral Vascular Disease  Normal cardiovascular exam     Neuro/Psych  Headaches,  Neuromuscular disease    GI/Hepatic   Endo/Other  Hypothyroidism   Renal/GU      Musculoskeletal  (+) Arthritis ,   Abdominal   Peds  Hematology   Anesthesia Other Findings   Reproductive/Obstetrics                             Anesthesia Physical Anesthesia Plan  ASA: II  Anesthesia Plan: General   Post-op Pain Management:    Induction: Intravenous  Airway Management Planned: Oral ETT  Additional Equipment:   Intra-op Plan:   Post-operative Plan: Extubation in OR  Informed Consent: I have reviewed the patients History and Physical, chart, labs and discussed the procedure including the risks, benefits and alternatives for the proposed anesthesia with the patient or authorized representative who has indicated his/her understanding and acceptance.     Plan Discussed with: CRNA, Anesthesiologist and Surgeon  Anesthesia Plan Comments:         Anesthesia Quick Evaluation

## 2015-07-19 NOTE — Transfer of Care (Signed)
Immediate Anesthesia Transfer of Care Note  Patient: Misty Barker  Procedure(s) Performed: Procedure(s): RIGHT HIP BEARING SURFACE REVISION (Right)  Patient Location: PACU  Anesthesia Type:General  Level of Consciousness: Patient easily awoken, sedated, comfortable, cooperative, following commands, responds to stimulation.   Airway & Oxygen Therapy: Patient spontaneously breathing, ventilating well, oxygen via simple oxygen mask.  Post-op Assessment: Report given to PACU RN, vital signs reviewed and stable, moving all extremities.   Post vital signs: Reviewed and stable.  Complications: No apparent anesthesia complications

## 2015-07-19 NOTE — H&P (Signed)
Subjective:  Patient is admitted for recurrent dislocation of right total hip arthroplasty.  Patient is a 70 y.o. female presented with a history of recurrent dislocation in the right hip.  The patient noted recent past surgery on the right hip in March of this year. Patient has been treated for two dislocations since her original surgery. The pain is under fair control at this time and describes their pain as mild to moderate. They are currently on Tylenol (PRN) and Hydrocodone for their pain. It is felt that the patient will need to undergo a revision procedure due to the recurrent dislocations.  Patient Active Problem List   Diagnosis Date Noted  . Hip dislocation, right ([place]) 06/30/2015  . OA (osteoarthritis) of hip 06/12/2015  . Right hip pain 05/24/2015  . URI, acute 03/10/2015  . 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 Abdominal pain 02/01/2015  . Scoliosis 02/01/2015  . Peripheral neuropathy ([place]) 02/01/2015  . Allergy   . Arthritis   . Depression   . Hyperlipidemia   . Thyroid disease   . History of chicken pox    Past Medical History  Diagnosis Date  . History of chicken pox   . Allergy     "seasonal allergies"  . Depression   . Hyperlipidemia   . Thyroid disease   . Abdominal pain 02/01/2015  . Scoliosis 02/01/2015  . URI, acute 03/10/2015  . 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 Right hip pain 05/24/2015  . Complication of anesthesia   . PONV (postoperative nausea and vomiting)   . Hypothyroidism     supplement used  . Frequent headaches     no problems now.  . Peripheral neuropathy ([place]) 02/01/2015    right hand"carpal tunnel"  . 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 Arthritis     osteoarthritis-"DDD" hips. fingers, toes.  . Hip dislocation, right [facility])     Past Surgical History  Procedure Laterality Date  . Tubal ligation  1980    left fallopian tube removal  . 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  Metacarpophalangeal joint capsulectomy / capsulotomy  1995  . Total hip arthroplasty Right 06/12/2015    Procedure: RIGHT TOTAL HIP ARTHROPLASTY ANTERIOR APPROACH;  Surgeon: Gaynelle Arabian, MD;  Location: [facility];  Service: Orthopedics;  Laterality: Right;  . Hip closed reduction Right 06/30/2015    Procedure: CLOSED MANIPULATION HIP;  Surgeon: Melina Schools, MD;  Location: [facility];  Service: Orthopedics;  Laterality: Right;    Prescriptions prior to admission  Medication Sig Dispense Refill Last Dose  . acetaminophen (TYLENOL) 500 MG tablet Take 500 mg by mouth at bedtime as needed for moderate pain.    07/18/2015 at 2330  . aspirin EC 81 MG tablet Take 81 mg by mouth every morning.    07/18/2015 at 0630  . cetirizine (ZYRTEC) 10 MG tablet Take 10 mg by mouth every morning.    07/19/2015 at 0630  . DULoxetine (CYMBALTA) 20 MG capsule Take 1 capsule (20 mg total) by mouth daily. 30 capsule 0 07/18/2015 at 0630  . furosemide (LASIX) 20 MG tablet Take 1 tablet (20 mg total) by mouth daily. 90 tablet 2 07/18/2015 at 0630  . gabapentin (NEURONTIN) 300 MG capsule Take 1 capsule (300 mg total) by mouth at bedtime. 90 capsule 3 07/18/2015 at 2100  . HYDROcodone-acetaminophen (NORCO) 5-325 MG tablet  Take 1 tablet by mouth every 4 (four) hours as needed. (Patient taking differently: Take 1 tablet by mouth every evening. ) 90 tablet 0 07/18/2015 at 1530  . levothyroxine (SYNTHROID, LEVOTHROID) 50 MCG tablet Take 1 tablet (50 mcg total) by mouth daily. 90 tablet 2 07/19/2015 at 0630  . liothyronine (CYTOMEL) 25 MCG tablet Take 1 tablet (25 mcg total) by mouth daily. 90 tablet 2 07/19/2015 at 0630  . methocarbamol (ROBAXIN) 500 MG tablet Take 1 tablet (500 mg total) by mouth 3 (three) times daily as needed for muscle spasms. (Patient not taking: Reported on 07/18/2015) 60 tablet 0 unknown  . rivaroxaban (XARELTO) 10 MG TABS tablet Take 1 tablet (10 mg total) by mouth daily with breakfast. Take Xarelto for two and a half more weeks,  then discontinue Xarelto. Once the patient has completed the blood thinner regimen, then take a Baby 81 mg Aspirin daily for three more weeks. (Patient not taking: Reported on 07/16/2015) 20 tablet 0 07/02/2015   Allergies  Allergen Reactions  . Penicillins Hives    Has patient had a PCN reaction causing immediate rash, facial/tongue/throat swelling, SOB or lightheadedness with hypotension: No Has patient had a PCN reaction causing severe rash involving mucus membranes or skin necrosis: No Has patient had a PCN reaction that required hospitalization No Has patient had a PCN reaction occurring within the last 10 years: No If all of the above answers are "NO", then may proceed with Cephalosporin use.  . Sulfa Antibiotics Hives    Social History  Substance Use Topics  . Smoking status: Never Smoker   . Smokeless tobacco: Not on file  . Alcohol Use: 0.0 oz/week    0 Standard drinks or equivalent per week     Comment: rare wine    Family History  Problem Relation Age of Onset  . Arthritis Mother   . Dementia Mother   . Mental illness Father     suicide  . Arthritis Maternal Grandmother   . Kidney disease Son     b/l multicystic kidney disease s/p 4 transplants    Review of Systems A comprehensive review of systems was negative except for: Musculoskeletal: positive for recurrent dislocation of the right hip  Objective: Vital signs in last 24 hours: Temp:  [97.5 F (36.4 C)] 97.5 F (36.4 C) (04/07 1408) Pulse Rate:  [69] 69 (04/07 1408) Resp:  [16] 16 (04/07 1408) BP: (136)/(61) 136/61 mmHg (04/07 1408) SpO2:  [100 %] 100 % (04/07 1408) Weight:  [45.416 kg (100 lb 2 oz)] 45.416 kg (100 lb 2 oz) (04/07 1502)  General Mental Status -Alert, cooperative and good historian. General Appearance-pleasant, Not in acute distress. Orientation-Oriented X3. Build & Nutrition-Petite, Well nourished and Well developed.  Head and Neck Head-normocephalic, atraumatic  . Neck Global Assessment - supple, no bruit auscultated on the right, no bruit auscultated on the left.  Eye Vision-Wears corrective lenses. Pupil - Bilateral-Regular and Round. Motion - Bilateral-EOMI.  Chest and Lung Exam Auscultation Breath sounds - clear at anterior chest wall and clear at posterior chest wall. Adventitious sounds - No Adventitious sounds.  Cardiovascular Auscultation Rhythm - Regular rate and rhythm. Heart Sounds - S1 WNL and S2 WNL. Murmurs & Other Heart Sounds: Murmur 1 - Location - Aortic Area and Sternal Border - Left. Timing - Early systolic. Grade - II/VI. Character - Low pitched.  Abdomen Palpation/Percussion Tenderness - Abdomen is non-tender to palpation. Rigidity (guarding) - Abdomen is soft. Auscultation Auscultation of the abdomen reveals -  Bowel sounds normal.  Female Genitourinary Note: Not done, not pertinent to present illness   Musculoskeletal Her right hip can be flexed to about 130, rotate in 30, out 40, abduct to 40 without pain on range of motion.   Imaging Review x-rays from the day of dislocation. She had an anterior dislocation. Her components did not migrate. The components remain in good position.  Assessment/Plan: Recurrent dislocation of right total hip  The treatment options including conservative management and revision arthroplasty were discussed at length. The risks and benefits of total hip revision arthroplasty were presented and reviewed.  Questions were answered. The patient acknowledged the explanation, agreed to proceed with the plan and a consent was signed.

## 2015-07-19 NOTE — Anesthesia Procedure Notes (Addendum)
Procedure Name: Intubation Date/Time: 07/19/2015 5:39 PM Performed by: Cynda Familia Pre-anesthesia Checklist: Patient identified, Emergency Drugs available, Suction available and Patient being monitored Patient Re-evaluated:Patient Re-evaluated prior to inductionOxygen Delivery Method: Circle System Utilized Preoxygenation: Pre-oxygenation with 100% oxygen Intubation Type: IV induction Ventilation: Mask ventilation without difficulty Laryngoscope Size: Miller and 2 Grade View: Grade I Tube type: Oral Tube size: 7.5 mm Number of attempts: 1 Airway Equipment and Method: Stylet Placement Confirmation: ETT inserted through vocal cords under direct vision,  positive ETCO2 and breath sounds checked- equal and bilateral Secured at: 21 cm Tube secured with: Tape Dental Injury: Teeth and Oropharynx as per pre-operative assessment  Comments: IV induction Smith---  Intubation AM CRNA atraumatic--- teeth and mouth as preop   Spinal Patient location during procedure: OR Start time: 07/19/2015 5:19 PM Staffing Resident/CRNA: Cynda Familia Performed by: resident/CRNA  Preanesthetic Checklist Completed: patient identified, site marked, surgical consent, pre-op evaluation, timeout performed, IV checked, risks and benefits discussed and monitors and equipment checked Spinal Block Patient position: sitting Prep: Betadine and site prepped and draped Patient monitoring: heart rate, continuous pulse ox and blood pressure Approach: midline Location: L3-4 (attempted x 2 unsuccessful- DR Tamala Julian attempted several times -- no sucess) Needle Needle type: Spinocan  Needle gauge: 22 G (per [facility] request) Additional Notes Expiration date of tray noted and within date.  Aborted- not successful

## 2015-07-20 LAB — CBC
HEMATOCRIT: 29 % — AB (ref 36.0–46.0)
HEMOGLOBIN: 9.6 g/dL — AB (ref 12.0–15.0)
MCH: 27.8 pg (ref 26.0–34.0)
MCHC: 33.1 g/dL (ref 30.0–36.0)
MCV: 84.1 fL (ref 78.0–100.0)
Platelets: 223 10*3/uL (ref 150–400)
RBC: 3.45 MIL/uL — ABNORMAL LOW (ref 3.87–5.11)
RDW: 14.3 % (ref 11.5–15.5)
WBC: 6.2 10*3/uL (ref 4.0–10.5)

## 2015-07-20 LAB — BASIC METABOLIC PANEL
Anion gap: 8 (ref 5–15)
BUN: 13 mg/dL (ref 6–20)
CHLORIDE: 103 mmol/L (ref 101–111)
CO2: 26 mmol/L (ref 22–32)
CREATININE: 0.65 mg/dL (ref 0.44–1.00)
Calcium: 9 mg/dL (ref 8.9–10.3)
GFR calc non Af Amer: >60 mL/min (ref >60)
Glucose, Bld: 147 mg/dL — ABNORMAL HIGH (ref 65–99)
Potassium: 4.2 mmol/L (ref 3.5–5.1)
Sodium: 137 mmol/L (ref 135–145)

## 2015-07-20 MED ORDER — ASPIRIN 325 MG PO TBEC: 325.0000 mg | DELAYED_RELEASE_TABLET | Freq: Every day | ORAL | Status: DC

## 2015-07-20 MED ORDER — OXYCODONE HCL 5 MG PO TABS: 5.0000 mg | ORAL_TABLET | ORAL | Status: DC | PRN

## 2015-07-20 MED ORDER — METHOCARBAMOL 500 MG PO TABS: 500.0000 mg | ORAL_TABLET | Freq: Four times a day (QID) | ORAL | Status: DC | PRN

## 2015-07-20 NOTE — Progress Notes (Addendum)
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    PT Comments    Pt with significant pain distal right thigh with WBing, also very painful to light palpation; pt reports pain as 10/10--to the point of causing R knee to buckle during amb; pt is able to amb and support self with UEs, with light TDWB RLE for adequate pain control; pt also concerned about insurance issues related to obs status; RN has contacted CM to discuss with pt; RN also made aware  Pt does have tightness in R IT band and tight muscle bands noted in distal quads, gentle soft tissue mobs provided--no significant pain with this; However, pain with WBing is unchanged from earlier this am  Follow Up Recommendations  Home health PT;Supervision for mobility/OOB     Equipment Recommendations  None recommended by PT    Recommendations for Other Services       Precautions / Restrictions Precautions Precautions: Fall Restrictions Weight Bearing Restrictions: No Other Position/Activity Restrictions: WBAT    Mobility  Bed Mobility Overal bed mobility: Needs Assistance Bed Mobility: Supine to Sit;Sit to Supine     Supine to sit: Min assist Sit to supine: Min assist   General bed mobility comments: cues for technique, assist for RLE d/t pain  Transfers Overall transfer level: Needs assistance Equipment used: Rolling walker (2 wheeled) Transfers: Sit to/from Stand Sit to Stand: Min assist Stand pivot transfers: Min assist;Mod assist;+2 safety/equipment       General transfer comment: cues for technique, hand placement, control of descent; assist for safety and to rise/stabilize  Ambulation/Gait Ambulation/Gait assistance: Min assist Ambulation Distance (Feet): 30 Feet (10'more) Assistive device: Rolling walker (2  wheeled) Gait Pattern/deviations: Step-to pattern;Antalgic;Decreased step length - left;Decreased step length - right Gait velocity: decreased   General Gait Details: cues for sequence, posture and position from RW; pt has significant pain with WBing to the point of R knee buckling;  pt is heavily reliant on UEs   Stairs            Wheelchair Mobility    Modified Rankin (Stroke Patients Only)       Balance Overall balance assessment: Needs assistance         Standing balance support: Bilateral upper extremity supported Standing balance-Leahy Scale: Poor Standing balance comment: reliant on UEs d/t pain                    Cognition Arousal/Alertness: Awake/alert Behavior During Therapy: WFL for tasks assessed/performed Overall Cognitive Status: Within Functional Limits for tasks assessed                      Exercises Total Joint Exercises Ankle Circles/Pumps: AROM;Both;10 reps Heel Slides: AAROM;Right;10 reps;Supine    General Comments        Pertinent Vitals/Pain Pain Assessment: 0-10 Pain Score: 10-Worst pain ever Pain Location: right thigh Pain Descriptors / Indicators: Burning;Cramping;Grimacing Pain Intervention(s): Limited activity within patient's tolerance;Monitored during session;Premedicated before session;Repositioned;Other (comment) (soft tissue mob)    Home Living Family/patient expects to be discharged to:: Private residence Living Arrangements: Spouse/significant other                  Prior Function            PT Goals (current goals can now be found in the care  plan section) Acute Rehab PT Goals Patient Stated Goal: home today, if doing well enough PT Goal Formulation: With patient Time For Goal Achievement: 07/27/15 Potential to Achieve Goals: Good Progress towards PT goals: Progressing toward goals    Frequency  7X/week    PT Plan Current plan remains appropriate    Co-evaluation              End of Session Equipment Utilized During Treatment: Gait belt Activity Tolerance: Patient limited by pain Patient left: in bed;with call bell/phone within reach;with bed alarm set     Time: 1241-1302 PT Time Calculation (min) (ACUTE ONLY): 21 min  Charges:  $Gait Training: 8-22 mins $Therapeutic Activity: 8-22 mins                    G Codes:      Mayur Duman Aug 16, 2015, 4:04 PM

## 2015-07-20 NOTE — Progress Notes (Signed)
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    PT Comments    Pt limited by pain and spasms thi spm, unable to take muscle relaxers as she states they make her "jittery"; discussed with RN, almost time for pain meds; Pt did well this am, if pain is well controlled she does not have issues with mobility; has good support from husband and can likely D/C IF pain controlled--if not may need to stay and we will continue to follow for; discussed with pt and RN  Follow Up Recommendations  Home health PT     Equipment Recommendations  None recommended by PT    Recommendations for Other Services       Precautions / Restrictions Precautions Precautions: Fall Restrictions Weight Bearing Restrictions: No Other Position/Activity Restrictions: WBAT    Mobility  Bed Mobility Overal bed mobility: Needs Assistance Bed Mobility: Supine to Sit     Supine to sit: Min guard Sit to supine: Min assist   General bed mobility comments: cues for technique, assist for RLE d/t pain  Transfers Overall transfer level: Needs assistance Equipment used: Rolling walker (2 wheeled) Transfers: Sit to/from [employer] Sit to Stand: Min assist;Mod assist;+2 safety/equipment Stand pivot transfers: Min assist;Mod assist;+2 safety/equipment       General transfer comment: cues for technique, use of UE s for pain control; pt with R knee buckling d/t pain, Rn called in to assist for safety  Ambulation/Gait Ambulation/Gait assistance: Min guard Ambulation Distance (Feet): 80 Feet (10' more) Assistive device: Rolling walker (2 wheeled) Gait Pattern/deviations: Step-to pattern Gait velocity: decreased   General Gait Details: cues for sequence, posture and position from [employer]             Wheelchair Mobility    Modified Rankin (Stroke Patients Only)       Balance                                    Cognition Arousal/Alertness: Awake/alert Behavior During Therapy: WFL for tasks assessed/performed Overall Cognitive Status: Within Functional Limits for tasks assessed                      Exercises      General Comments        Pertinent Vitals/Pain Pain Assessment: 0-10 Pain Score: 9  Pain Location: right hip/thigh Pain Descriptors / Indicators: Burning;Constant;Aching;Cramping Pain Intervention(s): Limited activity within patient's tolerance;Monitored during session;Premedicated before session;Ice applied;Heat applied (tylenol given prior, oxy not yet due) heat to distal thigh to help with spasms; ice to incision/proximal thigh    Home Living Family/patient expects to be discharged to:: Private residence Living Arrangements: Spouse/significant other Available Help at Discharge: Family;Available PRN/intermittently Type of Home: House Home Access: Stairs to enter   Home Layout: One level Home Equipment: Environmental consultant - 2 wheels;Shower seat - built in      Prior Function Level of Independence: Independent;Independent with assistive device(s)          PT Goals (current goals can now be found in the care plan section) Acute Rehab PT Goals Patient Stated Goal: home today, if doing well enough PT Goal Formulation: With patient Time For Goal Achievement: 07/27/15 Potential to Achieve Goals: Good Progress towards PT goals:  Progressing toward goals    Frequency  7X/week    PT Plan Current plan remains appropriate    Co-evaluation             End of Session   Activity Tolerance: Patient limited by pain Patient left: in bed;with call bell/phone within reach;with bed alarm set     Time: 1241-1302 PT Time Calculation (min) (ACUTE ONLY): 21 min  Charges:  $Gait Training: 8-22 mins $Therapeutic Activity: 8-22 mins                     G Codes:  Functional Assessment Tool Used: clincal judgement Functional Limitation: Mobility: Walking and moving around Mobility: Walking and Moving Around Current Status [id number]): At least 1 percent but less than 20 percent impaired, limited or restricted Mobility: Walking and Moving Around Goal Status [phone]): At least 1 percent but less than 20 percent impaired, limited or restricted   [facility] 07/20/2015, 1:32 PM

## 2015-07-20 NOTE — Progress Notes (Signed)
Discharged from floor via w/c to home, belongings and spouse with pt. No changes in assessment. Jerrett Baldinger, [employer]

## 2015-07-20 NOTE — Evaluation (Signed)
Physical Therapy Evaluation Patient Details Name: Misty Barker MRN: [id number] DOB: [birth year] Today's Date: 07/20/2015   History of Present Illness  s/p R hip bearing surface revision d/t recurrent dislocations anteriorly  Clinical Impression  Pt admitted with above diagnosis. Pt currently with functional limitations due to the deficits listed below (see PT Problem List).  Pt will benefit from skilled PT to increase their independence and safety with mobility to allow discharge to the venue listed below.       Follow Up Recommendations Home health PT;No PT follow up (vs no f/u)    Equipment Recommendations  None recommended by PT    Recommendations for Other Services       Precautions / Restrictions Precautions Precautions: Fall      Mobility  Bed Mobility Overal bed mobility: Needs Assistance Bed Mobility: Supine to Sit     Supine to sit: Min guard     General bed mobility comments: cues for technique  Transfers Overall transfer level: Needs assistance Equipment used: Rolling walker (2 wheeled) Transfers: Sit to/from Stand Sit to Stand: Min assist;Min guard         General transfer comment: cues for hand placement  Ambulation/Gait Ambulation/Gait assistance: Min guard Ambulation Distance (Feet): 80 Feet (10' more) Assistive device: Rolling walker (2 wheeled) Gait Pattern/deviations: Step-to pattern Gait velocity: decreased   General Gait Details: cues for sequence, posture and position from [employer]            Wheelchair Mobility    Modified Rankin (Stroke Patients Only)       Balance                                             Pertinent Vitals/Pain Pain Assessment: 0-10 Pain Score: 2  Pain Location: right hip  Pain Descriptors / Indicators: Burning Pain Intervention(s): Limited activity within patient's tolerance;Premedicated before session;Repositioned    Home Living Family/patient expects to be  discharged to:: Private residence Living Arrangements: Spouse/significant other Available Help at Discharge: Family;Available PRN/intermittently Type of Home: House Home Access: Stairs to enter   Entrance Stairs-Number of Steps: 1 Home Layout: One level Home Equipment: Walker - 2 wheels;Shower seat - built in      Prior Function Level of Independence: Independent;Independent with assistive device(s)               Hand Dominance   Dominant Hand: Right    Extremity/Trunk Assessment   Upper Extremity Assessment: Overall WFL for tasks assessed           Lower Extremity Assessment: RLE deficits/detail RLE Deficits / Details: ankle WFL;  knee and hip grossly 3/5       Communication   Communication: No difficulties  Cognition Arousal/Alertness: Awake/alert Behavior During Therapy: WFL for tasks assessed/performed Overall Cognitive Status: Within Functional Limits for tasks assessed                      General Comments      Exercises        Assessment/Plan    PT Assessment Patient needs continued PT services  PT Diagnosis Difficulty walking   PT Problem List Decreased strength;Decreased range of motion;Decreased activity tolerance;Decreased mobility;Decreased balance;Decreased knowledge of use of DME  PT Treatment Interventions DME instruction;Gait training;Functional mobility training;Stair training;Therapeutic activities;Therapeutic exercise;Patient/family education   PT Goals (Current goals can be  found in the Care Plan section) Acute Rehab PT Goals Patient Stated Goal: home today PT Goal Formulation: With patient Time For Goal Achievement: 07/27/15 Potential to Achieve Goals: Good    Frequency 7X/week   Barriers to discharge        Co-evaluation               End of Session   Activity Tolerance: Patient tolerated treatment well Patient left: with call bell/phone within reach;in chair;with chair alarm set Nurse Communication:  Mobility status    Functional Assessment Tool Used: clincal judgement Functional Limitation: Mobility: Walking and moving around Mobility: Walking and Moving Around Current Status [id number]): At least 1 percent but less than 20 percent impaired, limited or restricted Mobility: Walking and Moving Around Goal Status [phone]): At least 1 percent but less than 20 percent impaired, limited or restricted    Time: [id number] PT Time Calculation (min) (ACUTE ONLY): 27 min   Charges:   PT Evaluation $PT Eval Low Complexity: 1 Procedure PT Treatments $Gait Training: 8-22 mins   PT G Codes:   PT G-Codes **NOT FOR INPATIENT CLASS** Functional Assessment Tool Used: clincal judgement Functional Limitation: Mobility: Walking and moving around Mobility: Walking and Moving Around Current Status [id number]): At least 1 percent but less than 20 percent impaired, limited or restricted Mobility: Walking and Moving Around Goal Status [phone]): At least 1 percent but less than 20 percent impaired, limited or restricted    [facility] 07/20/2015, 12:02 PM

## 2015-07-20 NOTE — Progress Notes (Signed)
Subjective: 1 Day Post-Op Procedure(s) (LRB): RIGHT HIP BEARING SURFACE REVISION (Right) Patient reports pain as 2 on 0-10 scale.Drain [employer]. She is doing well. Waiting on Dc after PT.    Objective: Vital signs in last 24 hours: Temp:  [97 F (36.1 C)-98.1 F (36.7 C)] 98.1 F (36.7 C) (04/08 0520) Pulse Rate:  [69-90] 77 (04/08 0520) Resp:  [8-20] 16 (04/08 0520) BP: (98-143)/(55-83) 98/61 mmHg (04/08 0520) SpO2:  [99 %-100 %] 99 % (04/08 0520) Weight:  [45.416 kg (100 lb 2 oz)] 45.416 kg (100 lb 2 oz) (04/07 1502)  Intake/Output from previous day: 04/07 0701 - 04/08 0700 In: 1800 [I.V.:1800] Out: 630 [Urine:200; Drains:130; Blood:300] Intake/Output this shift:     Recent Labs  07/19/15 1435 07/20/15 0428  HGB 11.5* 9.6*    Recent Labs  07/19/15 1435 07/20/15 0428  WBC 6.7 6.2  RBC 4.32 3.45*  HCT 36.0 29.0*  PLT 270 223    Recent Labs  07/19/15 1435 07/20/15 0428  NA 140 137  K 3.9 4.2  CL 103 103  CO2 28 26  BUN 11 13  CREATININE 0.66 0.65  GLUCOSE 88 147*  CALCIUM 10.2 9.0    Recent Labs  07/19/15 1435  INR 1.03    Dorsiflexion/Plantar flexion intact Compartment soft  Assessment/Plan: 1 Day Post-Op Procedure(s) (LRB): RIGHT HIP BEARING SURFACE REVISION (Right) Up with therapy  Talia Hoheisel A 07/20/2015, 8:40 AM

## 2015-07-20 NOTE — Care Management Note (Signed)
Case Management Note  Patient Details  Name: Misty Barker MRN: [id number] Date of Birth: 1945/09/15  Subjective/Objective:                  RIGHT HIP BEARING SURFACE REVISION (Right)  Action/Plan: CM spoke with patient at the bedside. Patient has a RW and 3N1. She selected [facility] for HHPT. Dona at [place] notified of the referral. Patient's husband will assist her at home.   Expected Discharge Date:    07/20/15             Expected Discharge Plan:  [place]  In-House Referral:     Discharge planning Services  CM Consult  Post Acute Care Choice:  Home Health Choice offered to:  Patient  DME Arranged:  N/A DME Agency:  NA  HH Arranged:  PT HH Agency:  [facility]  Status of Service:  Completed, signed off  Medicare Important Message Given:    Date Medicare IM Given:    Medicare IM give by:    Date Additional Medicare IM Given:    Additional Medicare Important Message give by:     If discussed at [place] of Stay Meetings, dates discussed:    Additional Comments:  Apolonio Schneiders, RN 07/20/2015, 1:03 PM

## 2015-07-20 NOTE — Progress Notes (Signed)
OT Cancellation Note  Patient Details Name: Misty Barker MRN: [id number] DOB: [birth year]   Cancelled Treatment:    Reason Eval/Treat Not Completed: PT screened, no needs identified, will sign off  Brando Taves 07/20/2015, 11:44 AM  Lesle Chris, OTR/L [phone] 07/20/2015

## 2015-07-20 NOTE — Care Management Obs Status (Signed)
[place] NOTIFICATION   Patient Details  Name: Misty Barker MRN: [id number] Date of Birth: 1946-03-30   Medicare Observation Status Notification Given:  Yes  Explained Observation Notification and discussed patient's questions.   Apolonio Schneiders, RN 07/20/2015, 4:22 PM

## 2015-07-20 NOTE — Op Note (Signed)
NAME:  Misty Barker, Misty Barker NO.:  [id number]  MEDICAL RECORD NO.:  [id number]  LOCATION:  [id number]                         FACILITY[facility]  [facility]  PHYSICIAN:  Gaynelle Arabian, M.D.    DATE OF BIRTH:  1946-01-30  DATE OF PROCEDURE:  07/19/2015 DATE OF DISCHARGE:                              OPERATIVE REPORT   PREOPERATIVE DIAGNOSIS:  Recurrent dislocation of right hip.  POSTOPERATIVE DIAGNOSIS:  Recurrent dislocation of right hip.  PROCEDURE:  Right hip bearing surface revision.  SURGEON:  Gaynelle Arabian, M.D.  ASSISTANT:  Alexzandrew L. Perkins, P.A.C.  ANESTHESIA:  General.  ESTIMATED BLOOD LOSS:  300 mL.  DRAINS:  Hemovac x1.  COMPLICATIONS:  None.  CONDITION:  Stable to recovery.  BRIEF CLINICAL NOTE:  Misty Barker is a 70 year old female, who had a right total hip arthroplasty done approximately a month ago. Unfortunately, she has had two dislocation episodes anteriorly.  It was felt that there was inherent instability present as evidenced by these dislocation episodes.  She presents now for bearing surface versus total hip revision.  PROCEDURE IN DETAIL:  After successful administration of general anesthetic, the patient's feet were placed into the traction boots and she was placed onto the [facility] table, and boots were placed into the traction device.  No traction was utilized.  Her hip was then isolated from her thigh and perineum with plastic drapes, and prepped and draped in the usual sterile fashion.  Her previous anterior incision was reutilized, skin cut with a 10-blade through the subcutaneous tissue to the fascia.  Fascia was incised and then the muscle was peeled off the undersurface of the fascia to get to the interval between the rectus femoris and tensor fascia lata muscles.  This brought [place] right down to the capsule of the hip.  The capsule was torn from the dislocation.  The Ethibond sutures were removed.  We then placed retractors to  get exposure of the socket.  The components appeared to be in good position with gross inspection.  I placed a bone hook around the femoral neck and was able to easily dislocate the head from the acetabulum consistent with there being soft tissue laxity.  This was a 28+ 1.5 head.  I then placed a 28+ 5 trial head and reduced it.  Stability was better, but I was still able to dislocate this with a bone hook.  I felt that due to this laxity that we would need to go to a larger head construct or potentially have to revise one of the components.  We then dislocated the hip and removed the trial head.  The femur was retracted superior and lateral and we placed retractors to gain exposure to the acetabulum.  The acetabular shell appeared to be at the patient's anatomic position.  I was able to remove the acetabular liner and then we had direct exposure of the shell.  I threaded the impaction device into the shell.  We took the fluoroscopic spot of the shell and she was appeared to be in perfect anteversion and abduction matching her native anatomy and following the usual anatomic landmarks under fluoro, positioned exactly where I wanted it.  I thus decided to upsize to a 32- mm construct and  set at 28 and that should offer more inherent stability.  A 32-mm neutral +4 liner was then placed into the 48-mm acetabular shell.  Prior to placing this, of note, with the impactor threaded into the acetabular component, this was already well-fixed. The entire pelvis was moving as a single unit when I attempted to remove the cup.  The liner was impacted and showing to be locked into position.  I then inspected the femoral component.  We placed the femoral elevator around the proximal femur and then externally rotated the leg to 100 degrees.  The femoral lift was attached to the elevator and then when appropriate tension was applied, the femur was then extended and adducted.  This led to excellent  exposure of the femoral neck and complement.  The version of the component was matching the patient's native anteversion exactly.  Thus, she did not have any excessive anteversion.  The stem was also already well fixed.  Given that the components both were in excellent position and both were well fixed, I felt that we would leave them intact and go with the 32-mm construct. We placed a 32+ 5 trial head.  The femoral lift was removed and the hip was then reduced.  She had great stability and reduction.  I was able to externally rotate the leg to 90 degrees and then internally rotated about 40 and everything stayed in place.  A fluoroscopic film of AP pelvis was taken with the leg externally rotate to 20 degrees.  The hip was concentrically reduced and leg length was within about 3 mm of being exactly equal.  This leg was lengthened 3 mm by changing the construct. I then placed a bone hook around the femoral neck and was unable to extract the head or dislocate the hip.  Thus, I felt that this was a much more stable construct.  Given the stability and range of motion and given the stability to the bone hook testing, I felt that it would be a stable construct and thus, we decided to go with this construct.  We then dislocated the hip by applying traction and external rotation, and removed the trial head and placed the 32+ 5 ceramic head.  Hip was reduced and once again, it was extremely stable and does not dislocate when I placed traction on the femoral neck with the bone hook.  I was very satisfied with this.  We then copiously irrigated the wound with saline solution.  The remainder of the capsule was closed with Ethibond suture.  The Hemovac drain was placed, the fascia over the tensor fascia lata and rectus femoris was approximated with a running #1 V-Loc suture. Subcu was closed with interrupted 2-0 Vicryl, subcuticular running 4-0 Monocryl.  Drain was hooked to suction.  Prior to  closure, I injected the subcutaneous tissue and deep tissues with a total of 30 mL of 0.25% Marcaine.  Once closed, the incision was then cleaned and dried, and a Steri-Strips and a bulky sterile dressing applied.  She was subsequently awakened and transported to recovery in stable condition.     Gaynelle Arabian, M.D.     FA/MEDQ  D:  07/19/2015  T:  07/20/2015  Job:  [id number]

## 2015-07-20 NOTE — Discharge Instructions (Signed)
° °Dr. Frank Aluisio °Total Joint Specialist °[facility] °[address] °[place], [place] [address] °[phone] ° °ANTERIOR APPROACH TOTAL HIP REPLACEMENT POSTOPERATIVE DIRECTIONS ° ° °Hip Rehabilitation, Guidelines Following Surgery  °The results of a hip operation are greatly improved after range of motion and muscle strengthening exercises. Follow all safety measures which are given to protect your hip. If any of these exercises cause increased pain or swelling in your joint, decrease the amount until you are comfortable again. Then slowly increase the exercises. Call your caregiver if you have problems or questions.  ° °HOME CARE INSTRUCTIONS  °Remove items at home which could result in a fall. This includes throw rugs or furniture in walking pathways.  °· ICE to the affected hip every three hours for 30 minutes at a time and then as needed for pain and swelling.  Continue to use ice on the hip for pain and swelling from surgery. You may notice swelling that will progress down to the foot and ankle.  This is normal after surgery.  Elevate the leg when you are not up walking on it.   °· Continue to use the breathing machine which will help keep your temperature down.  It is common for your temperature to cycle up and down following surgery, especially at night when you are not up moving around and exerting yourself.  The breathing machine keeps your lungs expanded and your temperature down. ° ° °DIET °You may resume your previous home diet once your are discharged from the hospital. ° °DRESSING / WOUND CARE / SHOWERING °You may shower 3 days after surgery, but keep the wounds dry during showering.  You may use an occlusive plastic wrap (Press'n Seal for example), NO SOAKING/SUBMERGING IN THE BATHTUB.  If the bandage gets wet, change with a clean dry gauze.  If the incision gets wet, pat the wound dry with a clean towel. °You may start showering once you are discharged home but do not  submerge the incision under water. Just pat the incision dry and apply a dry gauze dressing on daily. °Change the surgical dressing daily and reapply a dry dressing each time. ° °ACTIVITY °Walk with your walker as instructed. °Use walker as long as suggested by your caregivers. °Avoid periods of inactivity such as sitting longer than an hour when not asleep. This helps prevent blood clots.  °You may resume a sexual relationship in one month or when given the OK by your doctor.  °You may return to work once you are cleared by your doctor.  °Do not drive a car for 6 weeks or until released by you surgeon.  °Do not drive while taking narcotics. ° °WEIGHT BEARING °Weight bearing as tolerated with assist device (walker, cane, etc) as directed, use it as long as suggested by your surgeon or therapist, typically at least 4-6 weeks. ° °POSTOPERATIVE CONSTIPATION PROTOCOL °Constipation - defined medically as fewer than three stools per week and severe constipation as less than one stool per week. ° °One of the most common issues patients have following surgery is constipation.  Even if you have a regular bowel pattern at home, your normal regimen is likely to be disrupted due to multiple reasons following surgery.  Combination of anesthesia, postoperative narcotics, change in appetite and fluid intake all can affect your bowels.  In order to avoid complications following surgery, here are some recommendations in order to help you during your recovery period. ° °Colace (docusate) - Pick up an over-the-counter   form of Colace or another stool softener and take twice a day as long as you are requiring postoperative pain medications.  Take with a full glass of water daily.  If you experience loose stools or diarrhea, hold the colace until you stool forms back up.  If your symptoms do not get better within 1 week or if they get worse, check with your doctor. ° °Dulcolax (bisacodyl) - Pick up over-the-counter and take as directed  by the product packaging as needed to assist with the movement of your bowels.  Take with a full glass of water.  Use this product as needed if not relieved by Colace only.  ° °MiraLax (polyethylene glycol) - Pick up over-the-counter to have on hand.  MiraLax is a solution that will increase the amount of water in your bowels to assist with bowel movements.  Take as directed and can mix with a glass of water, juice, soda, coffee, or tea.  Take if you go more than two days without a movement. °Do not use MiraLax more than once per day. Call your doctor if you are still constipated or irregular after using this medication for 7 days in a row. ° °If you continue to have problems with postoperative constipation, please contact the office for further assistance and recommendations.  If you experience "the worst abdominal pain ever" or develop nausea or vomiting, please contact the office immediatly for further recommendations for treatment. ° °ITCHING ° If you experience itching with your medications, try taking only a single pain pill, or even half a pain pill at a time.  You can also use Benadryl over the counter for itching or also to help with sleep.  ° °TED HOSE STOCKINGS °Wear the elastic stockings on both legs for three weeks following surgery during the day but you may remove then at night for sleeping. ° °MEDICATIONS °See your medication summary on the “After Visit Summary” that the nursing staff will review with you prior to discharge.  You may have some home medications which will be placed on hold until you complete the course of blood thinner medication.  It is important for you to complete the blood thinner medication as prescribed by your surgeon.  Continue your approved medications as instructed at time of discharge. ° °PRECAUTIONS °If you experience chest pain or shortness of breath - call 911 immediately for transfer to the hospital emergency department.  °If you develop a fever greater that 101 F,  purulent drainage from wound, increased redness or drainage from wound, foul odor from the wound/dressing, or calf pain - CONTACT YOUR SURGEON.   °                                                °FOLLOW-UP APPOINTMENTS °Make sure you keep all of your appointments after your operation with your surgeon and caregivers. You should call the office at the above phone number and make an appointment for approximately two weeks after the date of your surgery or on the date instructed by your surgeon outlined in the "After Visit Summary". ° °RANGE OF MOTION AND STRENGTHENING EXERCISES  °These exercises are designed to help you keep full movement of your hip joint. Follow your caregiver's or physical therapist's instructions. Perform all exercises about fifteen times, three times per day or as directed. Exercise both hips, even if you   have had only one joint replacement. These exercises can be done on a training (exercise) mat, on the floor, on a table or on a bed. Use whatever works the best and is most comfortable for you. Use music or television while you are exercising so that the exercises are a pleasant break in your day. This will make your life better with the exercises acting as a break in routine you can look forward to.  Lying on your back, slowly slide your foot toward your buttocks, raising your knee up off the floor. Then slowly slide your foot back down until your leg is straight again.  Lying on your back spread your legs as far apart as you can without causing discomfort.  Lying on your side, raise your upper leg and foot straight up from the floor as far as is comfortable. Slowly lower the leg and repeat.  Lying on your back, tighten up the muscle in the front of your thigh (quadriceps muscles). You can do this by keeping your leg straight and trying to raise your heel off the floor. This helps strengthen the largest muscle supporting your knee.  Lying on your back, tighten up the muscles of your  buttocks both with the legs straight and with the knee bent at a comfortable angle while keeping your heel on the floor.   IF YOU ARE TRANSFERRED TO A SKILLED REHAB FACILITY If the patient is transferred to a skilled rehab facility following release from the hospital, a list of the current medications will be sent to the facility for the patient to continue.  When discharged from the skilled rehab facility, please have the facility set up the patient's [place] prior to being released. Also, the skilled facility will be responsible for providing the patient with their medications at time of release from the facility to include their pain medication, the muscle relaxants, and their blood thinner medication. If the patient is still at the rehab facility at time of the two week follow up appointment, the skilled rehab facility will also need to assist the patient in arranging follow up appointment in our office and any transportation needs.  MAKE SURE YOU:  Understand these instructions.  Get help right away if you are not doing well or get worse.    Pick up stool softner and laxative for home use following surgery while on pain medications. Do not submerge incision under water. Please use good hand washing techniques while changing dressing each day. May shower starting three days after surgery. Please use a clean towel to pat the incision dry following showers. Continue to use ice for pain and swelling after surgery. Do not use any lotions or creams on the incision until instructed by your surgeon.  Take Aspirin 325 mg daily for three weeks and then reduce back to the 81 mg Aspirin dosing at home.

## 2015-07-20 NOTE — Discharge Summary (Signed)
Physician Discharge Summary   [name] MRN: [id number] DOB/AGE: April 30, 1945 70 y.o.  Admit date: 07/19/2015 Discharge date: 07/20/2015  Primary Diagnosis:  Recurrent dislocation of right hip. Admission Diagnoses:  Past Medical History  Diagnosis Date  . History of chicken pox   . Allergy     "seasonal allergies"  . Depression   . Hyperlipidemia   . Thyroid disease   . Abdominal pain 02/01/2015  . Scoliosis 02/01/2015  . URI, acute 03/10/2015  . 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 Right hip pain 05/24/2015  . Complication of anesthesia   . PONV (postoperative nausea and vomiting)   . Hypothyroidism     supplement used  . Frequent headaches     no problems now.  . Peripheral neuropathy ([place]) 02/01/2015    right hand"carpal tunnel"  . 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 Arthritis     osteoarthritis-"DDD" hips. fingers, toes.  . Hip dislocation, right [facility])    Discharge Diagnoses:   Active Problems:   Failed total hip arthroplasty with dislocation (HCC)  Estimated body mass index is 16.17 kg/(m^2) as calculated from the following:   Height as of this encounter: 5' 6"  (1.676 m).   Weight as of this encounter: 45.416 kg (100 lb 2 oz).  Procedure(s) (LRB): RIGHT HIP BEARING SURFACE REVISION (Right)   Consults: None  HPI: Misty Barker is a 70 year old female, who had a right total hip arthroplasty done approximately a month ago. Unfortunately, she has had two dislocation episodes anteriorly. It was felt that there was inherent instability present as evidenced by these dislocation episodes. She presents now for bearing surface versus total hip revision. Laboratory Data: Admission on 07/19/2015  Component Date Value Ref Range Status  . MRSA, PCR 07/19/2015 NEGATIVE  NEGATIVE Final  . Staphylococcus aureus 07/19/2015 NEGATIVE  NEGATIVE Final   Comment:        The Xpert SA Assay  (FDA approved for NASAL specimens in patients over 1 years of age), is one component of a comprehensive surveillance program.  Test performance has been validated by [facility] for patients greater than or equal to 61 year old. It is not intended to diagnose infection nor to guide or monitor treatment.   Marland Kitchen aPTT 07/19/2015 26  24 - 37 seconds Final  . WBC 07/19/2015 6.7  4.0 - 10.5 K/uL Final  . RBC 07/19/2015 4.32  3.87 - 5.11 MIL/uL Final  . Hemoglobin 07/19/2015 11.5* 12.0 - 15.0 g/dL Final  . HCT 07/19/2015 36.0  36.0 - 46.0 % Final  . MCV 07/19/2015 83.3  78.0 - 100.0 fL Final  . MCH 07/19/2015 26.6  26.0 - 34.0 pg Final  . MCHC 07/19/2015 31.9  30.0 - 36.0 g/dL Final  . RDW 07/19/2015 14.3  11.5 - 15.5 % Final  . Platelets 07/19/2015 270  150 - 400 K/uL Final  . Sodium 07/19/2015 140  135 - 145 mmol/L Final  . Potassium 07/19/2015 3.9  3.5 - 5.1 mmol/L Final  . Chloride 07/19/2015 103  101 - 111 mmol/L Final  . CO2 07/19/2015 28  22 - 32 mmol/L Final  . Glucose, Bld 07/19/2015 88  65 - 99 mg/dL Final  . BUN 07/19/2015 11  6 - 20 mg/dL Final  . Creatinine, Ser 07/19/2015 0.66  0.44 - 1.00 mg/dL Final  . Calcium 07/19/2015 10.2  8.9 - 10.3 mg/dL  Final  . Total Protein 07/19/2015 6.6  6.5 - 8.1 g/dL Final  . Albumin 07/19/2015 4.1  3.5 - 5.0 g/dL Final  . AST 07/19/2015 21  15 - 41 U/L Final  . ALT 07/19/2015 14  14 - 54 U/L Final  . Alkaline Phosphatase 07/19/2015 87  38 - 126 U/L Final  . Total Bilirubin 07/19/2015 0.3  0.3 - 1.2 mg/dL Final  . GFR calc non Af Amer 07/19/2015 >60  >60 mL/min Final  . GFR calc Af Amer 07/19/2015 >60  >60 mL/min Final   Comment: (NOTE) The eGFR has been calculated using the CKD EPI equation. This calculation has not been validated in all clinical situations. eGFR's persistently <60 mL/min signify possible Chronic Kidney Disease.   . Anion gap 07/19/2015 9  5 - 15 Final  . Prothrombin Time 07/19/2015 13.3  11.6 - 15.2 seconds Final  .  INR 07/19/2015 1.03  0.00 - 1.49 Final  . ABO/RH(D) 07/19/2015 AB POS   Final  . Antibody Screen 07/19/2015 NEG   Final  . Sample Expiration 07/19/2015 07/22/2015   Final  . Color, Urine 07/19/2015 YELLOW  YELLOW Final  . APPearance 07/19/2015 CLEAR  CLEAR Final  . Specific Gravity, Urine 07/19/2015 1.020  1.005 - 1.030 Final  . pH 07/19/2015 6.5  5.0 - 8.0 Final  . Glucose, UA 07/19/2015 NEGATIVE  NEGATIVE mg/dL Final  . Hgb urine dipstick 07/19/2015 NEGATIVE  NEGATIVE Final  . Bilirubin Urine 07/19/2015 NEGATIVE  NEGATIVE Final  . Ketones, ur 07/19/2015 NEGATIVE  NEGATIVE mg/dL Final  . Protein, ur 07/19/2015 NEGATIVE  NEGATIVE mg/dL Final  . Nitrite 07/19/2015 NEGATIVE  NEGATIVE Final  . Leukocytes, UA 07/19/2015 SMALL* NEGATIVE Final   Performed at [facility]  . Squamous Epithelial / LPF 07/19/2015 0-5* NONE SEEN Final  . WBC, UA 07/19/2015 0-5  0 - 5 WBC/hpf Final  . RBC / HPF 07/19/2015 0-5  0 - 5 RBC/hpf Final  . Bacteria, UA 07/19/2015 RARE* NONE SEEN Final   Performed at [facility]  . WBC 07/20/2015 6.2  4.0 - 10.5 K/uL Final  . RBC 07/20/2015 3.45* 3.87 - 5.11 MIL/uL Final  . Hemoglobin 07/20/2015 9.6* 12.0 - 15.0 g/dL Final  . HCT 07/20/2015 29.0* 36.0 - 46.0 % Final  . MCV 07/20/2015 84.1  78.0 - 100.0 fL Final  . MCH 07/20/2015 27.8  26.0 - 34.0 pg Final  . MCHC 07/20/2015 33.1  30.0 - 36.0 g/dL Final  . RDW 07/20/2015 14.3  11.5 - 15.5 % Final  . Platelets 07/20/2015 223  150 - 400 K/uL Final  . Sodium 07/20/2015 137  135 - 145 mmol/L Final  . Potassium 07/20/2015 4.2  3.5 - 5.1 mmol/L Final  . Chloride 07/20/2015 103  101 - 111 mmol/L Final  . CO2 07/20/2015 26  22 - 32 mmol/L Final  . Glucose, Bld 07/20/2015 147* 65 - 99 mg/dL Final  . BUN 07/20/2015 13  6 - 20 mg/dL Final  . Creatinine, Ser 07/20/2015 0.65  0.44 - 1.00 mg/dL Final  . Calcium 07/20/2015 9.0  8.9 - 10.3 mg/dL Final  . GFR calc non Af Amer 07/20/2015 >60  >60 mL/min Final  . GFR  calc Af Amer 07/20/2015 >60  >60 mL/min Final   Comment: (NOTE) The eGFR has been calculated using the CKD EPI equation. This calculation has not been validated in all clinical situations. eGFR's persistently <60 mL/min signify possible Chronic Kidney Disease.   Marland Kitchen  Anion gap 07/20/2015 8  5 - 15 Final  Admission on 06/30/2015, Discharged on 06/30/2015  Component Date Value Ref Range Status  . WBC 06/30/2015 7.6  4.0 - 10.5 K/uL Final  . RBC 06/30/2015 4.42  3.87 - 5.11 MIL/uL Final  . Hemoglobin 06/30/2015 12.1  12.0 - 15.0 g/dL Final  . HCT 06/30/2015 37.6  36.0 - 46.0 % Final  . MCV 06/30/2015 85.1  78.0 - 100.0 fL Final  . MCH 06/30/2015 27.4  26.0 - 34.0 pg Final  . MCHC 06/30/2015 32.2  30.0 - 36.0 g/dL Final  . RDW 06/30/2015 14.0  11.5 - 15.5 % Final  . Platelets 06/30/2015 539* 150 - 400 K/uL Final  . Sodium 06/30/2015 141  135 - 145 mmol/L Final  . Potassium 06/30/2015 3.3* 3.5 - 5.1 mmol/L Final  . Chloride 06/30/2015 103  101 - 111 mmol/L Final  . CO2 06/30/2015 26  22 - 32 mmol/L Final  . Glucose, Bld 06/30/2015 100* 65 - 99 mg/dL Final  . BUN 06/30/2015 8  6 - 20 mg/dL Final  . Creatinine, Ser 06/30/2015 0.58  0.44 - 1.00 mg/dL Final  . Calcium 06/30/2015 9.7  8.9 - 10.3 mg/dL Final  . GFR calc non Af Amer 06/30/2015 >60  >60 mL/min Final  . GFR calc Af Amer 06/30/2015 >60  >60 mL/min Final   Comment: (NOTE) The eGFR has been calculated using the CKD EPI equation. This calculation has not been validated in all clinical situations. eGFR's persistently <60 mL/min signify possible Chronic Kidney Disease.   . Anion gap 06/30/2015 12  5 - 15 Final  . Color, Urine 06/30/2015 YELLOW  YELLOW Final  . APPearance 06/30/2015 CLEAR  CLEAR Final  . Specific Gravity, Urine 06/30/2015 1.006  1.005 - 1.030 Final  . pH 06/30/2015 8.0  5.0 - 8.0 Final  . Glucose, UA 06/30/2015 NEGATIVE  NEGATIVE mg/dL Final  . Hgb urine dipstick 06/30/2015 NEGATIVE  NEGATIVE Final  . Bilirubin  Urine 06/30/2015 NEGATIVE  NEGATIVE Final  . Ketones, ur 06/30/2015 NEGATIVE  NEGATIVE mg/dL Final  . Protein, ur 06/30/2015 NEGATIVE  NEGATIVE mg/dL Final  . Nitrite 06/30/2015 NEGATIVE  NEGATIVE Final  . Leukocytes, UA 06/30/2015 NEGATIVE  NEGATIVE Final   MICROSCOPIC NOT DONE ON URINES WITH NEGATIVE PROTEIN, BLOOD, LEUKOCYTES, NITRITE, OR GLUCOSE <1000 mg/dL.  Admission on 06/12/2015, Discharged on 06/13/2015  Component Date Value Ref Range Status  . WBC 06/13/2015 14.2* 4.0 - 10.5 K/uL Final  . RBC 06/13/2015 4.19  3.87 - 5.11 MIL/uL Final  . Hemoglobin 06/13/2015 11.6* 12.0 - 15.0 g/dL Final  . HCT 06/13/2015 35.2* 36.0 - 46.0 % Final  . MCV 06/13/2015 84.0  78.0 - 100.0 fL Final  . MCH 06/13/2015 27.7  26.0 - 34.0 pg Final  . MCHC 06/13/2015 33.0  30.0 - 36.0 g/dL Final  . RDW 06/13/2015 13.3  11.5 - 15.5 % Final  . Platelets 06/13/2015 224  150 - 400 K/uL Final  . Sodium 06/13/2015 140  135 - 145 mmol/L Final  . Potassium 06/13/2015 3.9  3.5 - 5.1 mmol/L Final  . Chloride 06/13/2015 107  101 - 111 mmol/L Final  . CO2 06/13/2015 25  22 - 32 mmol/L Final  . Glucose, Bld 06/13/2015 119* 65 - 99 mg/dL Final  . BUN 06/13/2015 14  6 - 20 mg/dL Final  . Creatinine, Ser 06/13/2015 0.70  0.44 - 1.00 mg/dL Final  . Calcium 06/13/2015 9.3  8.9 - 10.3 mg/dL  Final  . GFR calc non Af Amer 06/13/2015 >60  >60 mL/min Final  . GFR calc Af Amer 06/13/2015 >60  >60 mL/min Final   Comment: (NOTE) The eGFR has been calculated using the CKD EPI equation. This calculation has not been validated in all clinical situations. eGFR's persistently <60 mL/min signify possible Chronic Kidney Disease.   Georgiann Hahn gap 06/13/2015 8  5 - 15 Final  Hospital Outpatient Visit on 06/04/2015  Component Date Value Ref Range Status  . aPTT 06/04/2015 30  24 - 37 seconds Final  . WBC 06/04/2015 6.6  4.0 - 10.5 K/uL Final  . RBC 06/04/2015 4.75  3.87 - 5.11 MIL/uL Final  . Hemoglobin 06/04/2015 12.8  12.0 - 15.0  g/dL Final  . HCT 06/04/2015 41.1  36.0 - 46.0 % Final  . MCV 06/04/2015 86.5  78.0 - 100.0 fL Final  . MCH 06/04/2015 26.9  26.0 - 34.0 pg Final  . MCHC 06/04/2015 31.1  30.0 - 36.0 g/dL Final  . RDW 06/04/2015 13.8  11.5 - 15.5 % Final  . Platelets 06/04/2015 277  150 - 400 K/uL Final  . Sodium 06/04/2015 136  135 - 145 mmol/L Final  . Potassium 06/04/2015 3.7  3.5 - 5.1 mmol/L Final  . Chloride 06/04/2015 98* 101 - 111 mmol/L Final  . CO2 06/04/2015 30  22 - 32 mmol/L Final  . Glucose, Bld 06/04/2015 90  65 - 99 mg/dL Final  . BUN 06/04/2015 10  6 - 20 mg/dL Final  . Creatinine, Ser 06/04/2015 0.68  0.44 - 1.00 mg/dL Final  . Calcium 06/04/2015 9.9  8.9 - 10.3 mg/dL Final  . Total Protein 06/04/2015 7.0  6.5 - 8.1 g/dL Final  . Albumin 06/04/2015 4.3  3.5 - 5.0 g/dL Final  . AST 06/04/2015 27  15 - 41 U/L Final  . ALT 06/04/2015 18  14 - 54 U/L Final  . Alkaline Phosphatase 06/04/2015 53  38 - 126 U/L Final  . Total Bilirubin 06/04/2015 0.6  0.3 - 1.2 mg/dL Final  . GFR calc non Af Amer 06/04/2015 >60  >60 mL/min Final  . GFR calc Af Amer 06/04/2015 >60  >60 mL/min Final   Comment: (NOTE) The eGFR has been calculated using the CKD EPI equation. This calculation has not been validated in all clinical situations. eGFR's persistently <60 mL/min signify possible Chronic Kidney Disease.   . Anion gap 06/04/2015 8  5 - 15 Final  . Prothrombin Time 06/04/2015 13.4  11.6 - 15.2 seconds Final  . INR 06/04/2015 1.04  0.00 - 1.49 Final  . ABO/RH(D) 06/04/2015 AB POS   Final  . Antibody Screen 06/04/2015 NEG   Final  . Sample Expiration 06/04/2015 06/15/2015   Final  . Extend sample reason 06/04/2015 NO TRANSFUSIONS OR PREGNANCY IN THE PAST 3 MONTHS   Final  . Color, Urine 06/04/2015 YELLOW  YELLOW Final  . APPearance 06/04/2015 CLEAR  CLEAR Final  . Specific Gravity, Urine 06/04/2015 1.009  1.005 - 1.030 Final  . pH 06/04/2015 6.5  5.0 - 8.0 Final  . Glucose, UA 06/04/2015 NEGATIVE   NEGATIVE mg/dL Final  . Hgb urine dipstick 06/04/2015 NEGATIVE  NEGATIVE Final  . Bilirubin Urine 06/04/2015 NEGATIVE  NEGATIVE Final  . Ketones, ur 06/04/2015 NEGATIVE  NEGATIVE mg/dL Final  . Protein, ur 06/04/2015 NEGATIVE  NEGATIVE mg/dL Final  . Nitrite 06/04/2015 NEGATIVE  NEGATIVE Final  . Leukocytes, UA 06/04/2015 NEGATIVE  NEGATIVE Final   MICROSCOPIC NOT  DONE ON URINES WITH NEGATIVE PROTEIN, BLOOD, LEUKOCYTES, NITRITE, OR GLUCOSE <1000 mg/dL.  Marland Kitchen MRSA, PCR 06/04/2015 NEGATIVE  NEGATIVE Final  . Staphylococcus aureus 06/04/2015 NEGATIVE  NEGATIVE Final   Comment:        The Xpert SA Assay (FDA approved for NASAL specimens in patients over 70 years of age), is one component of a comprehensive surveillance program.  Test performance has been validated by [facility] for patients greater than or equal to 75 year old. It is not intended to diagnose infection nor to guide or monitor treatment.   . ABO/RH(D) 06/04/2015 AB POS   Final     X-Rays:Dg Pelvis 1-2 Views  07/19/2015  CLINICAL DATA:  Intraoperative surface revision of right hip prosthesis EXAM: PELVIS - 1-2 VIEW COMPARISON:  07/16/2015 FLUOROSCOPY TIME:  Radiation Exposure Index (as provided by the fluoroscopic device): 0.63 mGy If the device does not provide the exposure index: Fluoroscopy Time:  8 seconds Number of Acquired Images:  1 FINDINGS: Bilateral hip replacements are noted.  No acute abnormality is seen. IMPRESSION: Intraoperative hip revision Electronically Signed   By: Inez Catalina M.D.   On: 07/19/2015 19:44   Dg Pelvis Portable  07/19/2015  CLINICAL DATA:  Status post hip revision on the right EXAM: PORTABLE PELVIS 1-2 VIEWS COMPARISON:  None. FINDINGS: Bilateral hip replacements are seen. Pelvic ring is intact. A surgical drain is noted in place. No acute bony abnormality is noted. IMPRESSION: No acute abnormality seen. Electronically Signed   By: Inez Catalina M.D.   On: 07/19/2015 19:46   Dg Pelvis  Portable  07/16/2015  CLINICAL DATA:  Status post closed tip manipulation. Initial encounter. EXAM: PORTABLE PELVIS 1-2 VIEWS COMPARISON:  Earlier today FINDINGS: The right hip arthroplasty has been reduced in the frontal projection. Located left hip arthroplasty. Negative for periprosthetic fracture. Osteopenia. IMPRESSION: Relocated right hip prosthesis. Electronically Signed   By: Monte Fantasia M.D.   On: 07/16/2015 19:37   Dg C-arm 1-60 Min-no Report  07/19/2015  CLINICAL DATA: surgery C-ARM 1-60 MINUTES Fluoroscopy was utilized by the requesting physician.  No radiographic interpretation.   Dg Hip Port Unilat With Pelvis 1v Right  06/30/2015  CLINICAL DATA:  Status post reduction of right hip dislocation EXAM: DG HIP (WITH OR WITHOUT PELVIS) 1V PORT RIGHT COMPARISON:  Right hip radiographs from earlier today FINDINGS: The patient is status post right total hip arthroplasty. There has been successful reduction of the right hip dislocation, with no residual malalignment at the right hip joint. No evidence of hardware fracture or loosening. No osseous fracture or suspicious focal osseous lesion. IMPRESSION: Status post right total hip arthroplasty, with no evidence of hardware complication. Successful reduction of right hip dislocation, with no residual malalignment. Electronically Signed   By: Ilona Sorrel M.D.   On: 06/30/2015 14:40   Dg Hip Unilat With Pelvis 2-3 Views Right  07/16/2015  CLINICAL DATA:  The patient felt her right hip pop while standing in her kitchen today. Initial encounter. EXAM: DG HIP (WITH OR WITHOUT PELVIS) 2-3V RIGHT COMPARISON:  Plain films of the right hip 06/30/2015. FINDINGS: The patient has bilateral total hip replacements. The right prosthesis is posteriorly and superiorly dislocated. The left hip is in place. No fracture is identified. IMPRESSION: Posterior, superior right hip dislocation. Electronically Signed   By: Inge Rise M.D.   On: 07/16/2015 17:20   Dg Hip  Unilat  With Pelvis 2-3 Views Right  06/30/2015  CLINICAL DATA:  Pt c/o  right hip pain, foreshortened, rotated leg, s/p standing in her kitchen, took step back and leg "went out." causing fall. Unable to bear wt on leg EXAM: DG HIP (WITH OR WITHOUT PELVIS) 2-3V RIGHT COMPARISON:  01/22/2015 FINDINGS: Patient has undergone interval right hip arthroplasty. The femoral head component is anteriorly and superiorly dislocated relative to the acetabular component. There is no acute fracture. Status post left hip arthroplasty which is intact. Metallic densities overlie the central pelvis, likely external to the patient. IMPRESSION: Dislocation of right hip arthroplasty. Electronically Signed   By: Nolon Nations M.D.   On: 06/30/2015 12:32    EKG: Orders placed or performed during the hospital encounter of 06/30/15  . ED EKG  . EKG 12-Lead  . EKG 12-Lead  . EKG     Hospital Course: Patient was admitted to [facility] and taken to the OR and underwent the above state procedure without complications.  Patient tolerated the procedure well and was later transferred to the recovery room and then to the orthopaedic floor for postoperative care.  They were given PO and IV analgesics for pain control following their surgery.  They were given 24 hours of postoperative antibiotics of  Anti-infectives    Start     Dose/Rate Route Frequency Ordered Stop   07/19/15 2330  ceFAZolin (ANCEF) IVPB 1 g/50 mL premix     1 g 100 mL/hr over 30 Minutes Intravenous Every 6 hours 07/19/15 2020 07/20/15 0514   07/19/15 1430  ceFAZolin (ANCEF) IVPB 2g/100 mL premix     2 g 200 mL/hr over 30 Minutes Intravenous On call to O.R. 07/19/15 1420 07/19/15 1810     and started on DVT prophylaxis in the form of Aspirin.   PT and OT were ordered for total hip protocol.  The patient was allowed to be WBAT with therapy. Discharge planning was consulted to help with postop disposition and equipment needs.  Patient had a decent  night on the evening of surgery.  They started to get up OOB with therapy on day one.  Hemovac drain was pulled without difficulty.  Patient was seen in rounds on POD 1 and was ready to go home.  Diet: Cardiac diet Activity:WBAT Follow-up:in 2 weeks Disposition - Home Discharged Condition: good   Discharge Instructions    Call MD / Call 911    Complete by:  As directed   If you experience chest pain or shortness of breath, CALL 911 and be transported to the hospital emergency room.  If you develope a fever above 101 F, pus (white drainage) or increased drainage or redness at the wound, or calf pain, call your surgeon's office.     Change dressing    Complete by:  As directed   You may change your dressing dressing daily with sterile 4 x 4 inch gauze dressing and paper tape.  Do not submerge the incision under water.     Constipation Prevention    Complete by:  As directed   Drink plenty of fluids.  Prune juice may be helpful.  You may use a stool softener, such as Colace (over the counter) 100 mg twice a day.  Use MiraLax (over the counter) for constipation as needed.     Diet - low sodium heart healthy    Complete by:  As directed      Discharge instructions    Complete by:  As directed   Pick up stool softner and laxative for home use following surgery  while on pain medications. Do not submerge incision under water. Please use good hand washing techniques while changing dressing each day. May shower starting three days after surgery. Please use a clean towel to pat the incision dry following showers. Continue to use ice for pain and swelling after surgery. Do not use any lotions or creams on the incision until instructed by your surgeon.   Total Hip Protocol.  Take Aspirin 325 mg daily for three weeks and then reduce back to the 81 mg Aspirin dosing at home.  Postoperative Constipation Protocol  Constipation - defined medically as fewer than three stools per week and severe  constipation as less than one stool per week.  One of the most common issues patients have following surgery is constipation.  Even if you have a regular bowel pattern at home, your normal regimen is likely to be disrupted due to multiple reasons following surgery.  Combination of anesthesia, postoperative narcotics, change in appetite and fluid intake all can affect your bowels.  In order to avoid complications following surgery, here are some recommendations in order to help you during your recovery period.  Colace (docusate) - Pick up an over-the-counter form of Colace or another stool softener and take twice a day as long as you are requiring postoperative pain medications.  Take with a full glass of water daily.  If you experience loose stools or diarrhea, hold the colace until you stool forms back up.  If your symptoms do not get better within 1 week or if they get worse, check with your doctor.  Dulcolax (bisacodyl) - Pick up over-the-counter and take as directed by the product packaging as needed to assist with the movement of your bowels.  Take with a full glass of water.  Use this product as needed if not relieved by Colace only.   MiraLax (polyethylene glycol) - Pick up over-the-counter to have on hand.  MiraLax is a solution that will increase the amount of water in your bowels to assist with bowel movements.  Take as directed and can mix with a glass of water, juice, soda, coffee, or tea.  Take if you go more than two days without a movement. Do not use MiraLax more than once per day. Call your doctor if you are still constipated or irregular after using this medication for 7 days in a row.  If you continue to have problems with postoperative constipation, please contact the office for further assistance and recommendations.  If you experience "the worst abdominal pain ever" or develop nausea or vomiting, please contact the office immediatly for further recommendations for treatment.     Do  not sit on low chairs, stoools or toilet seats, as it may be difficult to get up from low surfaces    Complete by:  As directed      Driving restrictions    Complete by:  As directed   No driving until released by the physician.     Increase activity slowly as tolerated    Complete by:  As directed      Lifting restrictions    Complete by:  As directed   No lifting until released by the physician.     Patient may shower    Complete by:  As directed   You may shower without a dressing once there is no drainage.  Do not wash over the wound.  If drainage remains, do not shower until drainage stops.     TED hose  Complete by:  As directed   Use stockings (TED hose) for 3 weeks on both leg(s).  You may remove them at night for sleeping.     Weight bearing as tolerated    Complete by:  As directed   Laterality:  right  Extremity:  Lower            Medication List    STOP taking these medications        HYDROcodone-acetaminophen 5-325 MG tablet  Commonly known as:  NORCO     rivaroxaban 10 MG Tabs tablet  Commonly known as:  XARELTO      TAKE these medications        acetaminophen 500 MG tablet  Commonly known as:  TYLENOL  Take 500 mg by mouth at bedtime as needed for moderate pain.     aspirin 325 MG EC tablet  Take 1 tablet (325 mg total) by mouth daily. Take Aspirin 325 mg daily for three weeks and then reduce back to the 81 mg Aspirin dosing at home.     cetirizine 10 MG tablet  Commonly known as:  ZYRTEC  Take 10 mg by mouth every morning.     DULoxetine 20 MG capsule  Commonly known as:  CYMBALTA  Take 1 capsule (20 mg total) by mouth daily.     furosemide 20 MG tablet  Commonly known as:  LASIX  Take 1 tablet (20 mg total) by mouth daily.     gabapentin 300 MG capsule  Commonly known as:  NEURONTIN  Take 1 capsule (300 mg total) by mouth at bedtime.     levothyroxine 50 MCG tablet  Commonly known as:  SYNTHROID, LEVOTHROID  Take 1 tablet (50 mcg total)  by mouth daily.     liothyronine 25 MCG tablet  Commonly known as:  CYTOMEL  Take 1 tablet (25 mcg total) by mouth daily.     methocarbamol 500 MG tablet  Commonly known as:  ROBAXIN  Take 1 tablet (500 mg total) by mouth every 6 (six) hours as needed for muscle spasms.     oxyCODONE 5 MG immediate release tablet  Commonly known as:  Oxy IR/ROXICODONE  Take 1-2 tablets (5-10 mg total) by mouth every 3 (three) hours as needed for moderate pain or severe pain.           Follow-up Information    Follow up with Gearlean Alf, MD. Schedule an appointment as soon as possible for a visit on 08/01/2015.   Specialty:  Orthopedic Surgery   Why:  Call office at [phone] to setup appointment on Thursday 08/01/15 with Dr. Wynelle Link.   Contact information:   [address] [place] [address] [phone]       Signed: Arlee Muslim, PA-C Orthopaedic Surgery 07/20/2015, 12:08 PM

## 2015-07-22 ENCOUNTER — Encounter (HOSPITAL_COMMUNITY): Payer: Self-pay | Admitting: Orthopedic Surgery

## 2015-07-22 DIAGNOSIS — Z4732 Aftercare following explantation of hip joint prosthesis: Secondary | ICD-10-CM | POA: Diagnosis not present

## 2015-07-22 DIAGNOSIS — M519 Unspecified thoracic, thoracolumbar and lumbosacral intervertebral disc disorder: Secondary | ICD-10-CM | POA: Diagnosis not present

## 2015-07-22 DIAGNOSIS — M419 Scoliosis, unspecified: Secondary | ICD-10-CM | POA: Diagnosis not present

## 2015-07-22 DIAGNOSIS — F329 Major depressive disorder, single episode, unspecified: Secondary | ICD-10-CM | POA: Diagnosis not present

## 2015-07-22 DIAGNOSIS — G629 Polyneuropathy, unspecified: Secondary | ICD-10-CM | POA: Diagnosis not present

## 2015-07-22 DIAGNOSIS — M159 Polyosteoarthritis, unspecified: Secondary | ICD-10-CM | POA: Diagnosis not present

## 2015-07-25 DIAGNOSIS — G629 Polyneuropathy, unspecified: Secondary | ICD-10-CM | POA: Diagnosis not present

## 2015-07-25 DIAGNOSIS — M419 Scoliosis, unspecified: Secondary | ICD-10-CM | POA: Diagnosis not present

## 2015-07-25 DIAGNOSIS — F329 Major depressive disorder, single episode, unspecified: Secondary | ICD-10-CM | POA: Diagnosis not present

## 2015-07-25 DIAGNOSIS — Z4732 Aftercare following explantation of hip joint prosthesis: Secondary | ICD-10-CM | POA: Diagnosis not present

## 2015-07-25 DIAGNOSIS — M159 Polyosteoarthritis, unspecified: Secondary | ICD-10-CM | POA: Diagnosis not present

## 2015-07-25 DIAGNOSIS — M519 Unspecified thoracic, thoracolumbar and lumbosacral intervertebral disc disorder: Secondary | ICD-10-CM | POA: Diagnosis not present

## 2015-07-29 DIAGNOSIS — Z4732 Aftercare following explantation of hip joint prosthesis: Secondary | ICD-10-CM | POA: Diagnosis not present

## 2015-07-29 DIAGNOSIS — M519 Unspecified thoracic, thoracolumbar and lumbosacral intervertebral disc disorder: Secondary | ICD-10-CM | POA: Diagnosis not present

## 2015-07-29 DIAGNOSIS — G629 Polyneuropathy, unspecified: Secondary | ICD-10-CM | POA: Diagnosis not present

## 2015-07-29 DIAGNOSIS — F329 Major depressive disorder, single episode, unspecified: Secondary | ICD-10-CM | POA: Diagnosis not present

## 2015-07-29 DIAGNOSIS — M159 Polyosteoarthritis, unspecified: Secondary | ICD-10-CM | POA: Diagnosis not present

## 2015-07-29 DIAGNOSIS — M419 Scoliosis, unspecified: Secondary | ICD-10-CM | POA: Diagnosis not present

## 2015-08-01 DIAGNOSIS — M519 Unspecified thoracic, thoracolumbar and lumbosacral intervertebral disc disorder: Secondary | ICD-10-CM | POA: Diagnosis not present

## 2015-08-01 DIAGNOSIS — Z4732 Aftercare following explantation of hip joint prosthesis: Secondary | ICD-10-CM | POA: Diagnosis not present

## 2015-08-01 DIAGNOSIS — F329 Major depressive disorder, single episode, unspecified: Secondary | ICD-10-CM | POA: Diagnosis not present

## 2015-08-01 DIAGNOSIS — Z471 Aftercare following joint replacement surgery: Secondary | ICD-10-CM | POA: Diagnosis not present

## 2015-08-01 DIAGNOSIS — M419 Scoliosis, unspecified: Secondary | ICD-10-CM | POA: Diagnosis not present

## 2015-08-01 DIAGNOSIS — M159 Polyosteoarthritis, unspecified: Secondary | ICD-10-CM | POA: Diagnosis not present

## 2015-08-01 DIAGNOSIS — Z96641 Presence of right artificial hip joint: Secondary | ICD-10-CM | POA: Diagnosis not present

## 2015-08-01 DIAGNOSIS — G629 Polyneuropathy, unspecified: Secondary | ICD-10-CM | POA: Diagnosis not present

## 2015-08-05 DIAGNOSIS — Z4732 Aftercare following explantation of hip joint prosthesis: Secondary | ICD-10-CM | POA: Diagnosis not present

## 2015-08-05 DIAGNOSIS — G629 Polyneuropathy, unspecified: Secondary | ICD-10-CM | POA: Diagnosis not present

## 2015-08-05 DIAGNOSIS — M159 Polyosteoarthritis, unspecified: Secondary | ICD-10-CM | POA: Diagnosis not present

## 2015-08-05 DIAGNOSIS — M519 Unspecified thoracic, thoracolumbar and lumbosacral intervertebral disc disorder: Secondary | ICD-10-CM | POA: Diagnosis not present

## 2015-08-05 DIAGNOSIS — M419 Scoliosis, unspecified: Secondary | ICD-10-CM | POA: Diagnosis not present

## 2015-08-05 DIAGNOSIS — F329 Major depressive disorder, single episode, unspecified: Secondary | ICD-10-CM | POA: Diagnosis not present

## 2015-08-07 ENCOUNTER — Encounter: Payer: Self-pay | Admitting: Family Medicine

## 2015-08-07 MED ORDER — CIPROFLOXACIN HCL 250 MG PO TABS: 250.0000 mg | ORAL_TABLET | Freq: Two times a day (BID) | ORAL | Status: DC

## 2015-08-07 NOTE — Telephone Encounter (Signed)
Called pt back- she confirms typical UTI sx.  She is not taking cymbalta since she had her hip surgery- not sure if she will go back on this eventually.   Took cipro recently without any adverse effect Will rx for her. She will seek care with [place] if this does not resolve her symptoms .

## 2015-08-07 NOTE — Telephone Encounter (Signed)
Pt called in to check the status of message sent to PCP. Informed pt that provide is out of the office. She would like to know if someone could call her back to assist further.    CB: [phone]

## 2015-08-07 NOTE — Telephone Encounter (Signed)
Patient called checking on the status of message below states the nurse was going to call her back. Best # [phone] Misty Barker)

## 2015-08-07 NOTE — Telephone Encounter (Signed)
Message per MyChart:  ----- Message from [place] to Mosie Lukes, MD sent at 08/07/2015 5:51 AM -----      I have had 2 hip surgeries and 2 dislocations since March 1. I am still recuperating and now have a urinary tract infection. Would Dr. Charlett Blake please send in a prescription for Cipro to [facility] on [address] ASAP.    Thank you    Called to follow up with patient.  Pt states she's having burning on urination and persistent urge to urinate with only small amounts of urine being excreted.  Denies fever, abdominal/pelvic pain.  She had similar symptoms back in Feb and Dr. Charlett Blake prescribed Cipro.  Pt states that seems to be the only thing that works for her.  Patient was informed that an office visit may be necessary for proper treatment.  Pt states given recent surgery, it would be very difficult for her to come in.    Please advise.

## 2015-08-22 DIAGNOSIS — Z79899 Other long term (current) drug therapy: Secondary | ICD-10-CM | POA: Diagnosis not present

## 2015-08-22 DIAGNOSIS — Z79891 Long term (current) use of opiate analgesic: Secondary | ICD-10-CM | POA: Diagnosis not present

## 2015-08-22 DIAGNOSIS — M1288 Other specific arthropathies, not elsewhere classified, other specified site: Secondary | ICD-10-CM | POA: Diagnosis not present

## 2015-08-22 DIAGNOSIS — M25559 Pain in unspecified hip: Secondary | ICD-10-CM | POA: Diagnosis not present

## 2015-08-22 DIAGNOSIS — G894 Chronic pain syndrome: Secondary | ICD-10-CM | POA: Diagnosis not present

## 2015-08-22 DIAGNOSIS — M5137 Other intervertebral disc degeneration, lumbosacral region: Secondary | ICD-10-CM | POA: Diagnosis not present

## 2015-08-30 DIAGNOSIS — Z471 Aftercare following joint replacement surgery: Secondary | ICD-10-CM | POA: Diagnosis not present

## 2015-08-30 DIAGNOSIS — Z96641 Presence of right artificial hip joint: Secondary | ICD-10-CM | POA: Diagnosis not present

## 2015-09-28 ENCOUNTER — Other Ambulatory Visit: Payer: Self-pay | Admitting: Family Medicine

## 2015-10-04 DIAGNOSIS — Z471 Aftercare following joint replacement surgery: Secondary | ICD-10-CM | POA: Diagnosis not present

## 2015-10-04 DIAGNOSIS — Z96641 Presence of right artificial hip joint: Secondary | ICD-10-CM | POA: Diagnosis not present

## 2015-10-17 DIAGNOSIS — Z79891 Long term (current) use of opiate analgesic: Secondary | ICD-10-CM | POA: Diagnosis not present

## 2015-10-17 DIAGNOSIS — M5137 Other intervertebral disc degeneration, lumbosacral region: Secondary | ICD-10-CM | POA: Diagnosis not present

## 2015-10-17 DIAGNOSIS — M25559 Pain in unspecified hip: Secondary | ICD-10-CM | POA: Diagnosis not present

## 2015-10-17 DIAGNOSIS — Z79899 Other long term (current) drug therapy: Secondary | ICD-10-CM | POA: Diagnosis not present

## 2015-10-17 DIAGNOSIS — M47817 Spondylosis without myelopathy or radiculopathy, lumbosacral region: Secondary | ICD-10-CM | POA: Diagnosis not present

## 2015-10-17 DIAGNOSIS — G894 Chronic pain syndrome: Secondary | ICD-10-CM | POA: Diagnosis not present

## 2015-10-18 DIAGNOSIS — Z96641 Presence of right artificial hip joint: Secondary | ICD-10-CM | POA: Diagnosis not present

## 2015-10-18 DIAGNOSIS — Z471 Aftercare following joint replacement surgery: Secondary | ICD-10-CM | POA: Diagnosis not present

## 2015-10-23 ENCOUNTER — Emergency Department (HOSPITAL_COMMUNITY)
Admission: EM | Admit: 2015-10-23 | Discharge: 2015-10-23 | Disposition: A | Discharge status: Home / Self Care | Payer: Medicare Other | Attending: Emergency Medicine | Admitting: Emergency Medicine

## 2015-10-23 ENCOUNTER — Emergency Department (HOSPITAL_COMMUNITY): Payer: Medicare Other

## 2015-10-23 ENCOUNTER — Encounter (HOSPITAL_COMMUNITY): Payer: Self-pay | Admitting: Nurse Practitioner

## 2015-10-23 DIAGNOSIS — Z7982 Long term (current) use of aspirin: Secondary | ICD-10-CM | POA: Diagnosis not present

## 2015-10-23 DIAGNOSIS — Y929 Unspecified place or not applicable: Secondary | ICD-10-CM | POA: Insufficient documentation

## 2015-10-23 DIAGNOSIS — X58XXXA Exposure to other specified factors, initial encounter: Secondary | ICD-10-CM | POA: Diagnosis not present

## 2015-10-23 DIAGNOSIS — Z79899 Other long term (current) drug therapy: Secondary | ICD-10-CM | POA: Diagnosis not present

## 2015-10-23 DIAGNOSIS — Y999 Unspecified external cause status: Secondary | ICD-10-CM | POA: Insufficient documentation

## 2015-10-23 DIAGNOSIS — E785 Hyperlipidemia, unspecified: Secondary | ICD-10-CM | POA: Insufficient documentation

## 2015-10-23 DIAGNOSIS — E039 Hypothyroidism, unspecified: Secondary | ICD-10-CM | POA: Diagnosis not present

## 2015-10-23 DIAGNOSIS — S73004A Unspecified dislocation of right hip, initial encounter: Secondary | ICD-10-CM | POA: Diagnosis not present

## 2015-10-23 DIAGNOSIS — Y793 Surgical instruments, materials and orthopedic devices (including sutures) associated with adverse incidents: Secondary | ICD-10-CM | POA: Diagnosis not present

## 2015-10-23 DIAGNOSIS — Z792 Long term (current) use of antibiotics: Secondary | ICD-10-CM | POA: Insufficient documentation

## 2015-10-23 DIAGNOSIS — Z96641 Presence of right artificial hip joint: Secondary | ICD-10-CM | POA: Diagnosis not present

## 2015-10-23 DIAGNOSIS — M1990 Unspecified osteoarthritis, unspecified site: Secondary | ICD-10-CM | POA: Diagnosis not present

## 2015-10-23 DIAGNOSIS — S73006A Unspecified dislocation of unspecified hip, initial encounter: Secondary | ICD-10-CM | POA: Diagnosis not present

## 2015-10-23 DIAGNOSIS — Y939 Activity, unspecified: Secondary | ICD-10-CM | POA: Diagnosis not present

## 2015-10-23 DIAGNOSIS — T84020A Dislocation of internal right hip prosthesis, initial encounter: Secondary | ICD-10-CM | POA: Diagnosis not present

## 2015-10-23 DIAGNOSIS — Z9889 Other specified postprocedural states: Secondary | ICD-10-CM

## 2015-10-23 DIAGNOSIS — F329 Major depressive disorder, single episode, unspecified: Secondary | ICD-10-CM | POA: Insufficient documentation

## 2015-10-23 DIAGNOSIS — S79911A Unspecified injury of right hip, initial encounter: Secondary | ICD-10-CM | POA: Diagnosis present

## 2015-10-23 DIAGNOSIS — M25551 Pain in right hip: Secondary | ICD-10-CM

## 2015-10-23 DIAGNOSIS — T148 Other injury of unspecified body region: Secondary | ICD-10-CM | POA: Diagnosis not present

## 2015-10-23 MED ORDER — MORPHINE SULFATE (PF) 4 MG/ML IV SOLN
4.0000 mg | Freq: Once | INTRAVENOUS | Status: AC
  Administered 2015-10-23: 4 mg via INTRAVENOUS
  Filled 2015-10-23: qty 1

## 2015-10-23 MED ORDER — SODIUM CHLORIDE 0.9 % IV BOLUS (SEPSIS)
500.0000 mL | Freq: Once | INTRAVENOUS | Status: AC
  Administered 2015-10-23: 500 mL via INTRAVENOUS

## 2015-10-23 MED ORDER — OXYCODONE HCL 5 MG PO TABS: 5.0000 mg | ORAL_TABLET | ORAL | Status: DC | PRN

## 2015-10-23 MED ORDER — PROPOFOL 10 MG/ML IV BOLUS
0.5000 mg/kg | Freq: Once | INTRAVENOUS | Status: AC
  Administered 2015-10-23: 22.3 mg via INTRAVENOUS
  Filled 2015-10-23: qty 20

## 2015-10-23 NOTE — Discharge Instructions (Signed)
Hip Dislocation °Hip dislocation is the displacement of the "ball" at the head of your thigh bone (femur) from its socket in the hip bone (pelvis). The ball-and-socket structure of the hip joint gives it a lot of stability, while allowing it to move freely. Therefore, a lot of force is required to displace the femur from its socket. A hip dislocation is an emergency. If you believe you have dislocated your hip and cannot move your leg, call for help immediately. Do not try to move. °CAUSES °The most common cause of hip dislocation is motor vehicle accidents. However, force from falls from a height (a ladder or building), injuries from contact sports, or injuries from industrial accidents can be enough to dislocate your hip. °SYMPTOMS °A hip dislocation is very painful. If you have a dislocated hip, you will not be able to move your hip. If you have nerve damage, you may not have feeling in your lower leg, foot, or ankle.  °DIAGNOSIS °Usually, your caregiver can diagnose a hip dislocation by looking at the position of your leg. Generally, X-ray exams are done to check for fractures in your femur or pelvis. The leg of the dislocated hip will appear shorter than the other leg, and your foot will be turned inward. °TREATMENT  °Your caregiver can manipulate your bones back into the joint (reduction). If there are no other complications involved with your dislocation, such as fractures or damage to blood vessels or nerves, this procedure can be done without surgery. Before this procedure, you will be given medicine so that you will not feel pain (anesthetic). Often specialized imaging exams are done after the reduction (magnetic resonance imaging [MRI] or computed tomography [CT]) to check for loose pieces of cartilage or bone in the joint. °If a manual reduction fails or you have nerve damage, damage to your blood vessels, or bone fractures, surgery will be necessary to perform the reduction.  °HOME CARE  INSTRUCTIONS °The following measures can help to reduce pain and speed up the healing process: °· Rest your injured joint. Do not move your joint if it is painful. Also, avoid activities similar to the one that caused your injury. °· Apply ice to your injured joint for 1 to 2 days after your reduction or as directed by your caregiver. Applying ice helps to reduce inflammation and pain. °¨ Put ice in a plastic bag. °¨ Place a towel between your skin and the bag. °¨ Leave the ice on for 15 to 20 minutes at a time, every 2 hours while you are awake. °· Use crutches or a walker as directed by your caregiver. °· Exercise your hip and leg as directed by your caregiver. °· Take over-the-counter or prescription medicine for pain as directed by your caregiver. °SEEK IMMEDIATE MEDICAL CARE IF: °· Your pain becomes worse rather than better. °· You feel like your hip has become dislocated again. °MAKE SURE YOU: °· Understand these instructions. °· Will watch your condition. °· Will get help right away if you are not doing well or get worse. °  °This information is not intended to replace advice given to you by your health care provider. Make sure you discuss any questions you have with your health care provider. °  °Document Released: 12/23/2000 Document Revised: 04/20/2014 Document Reviewed: 10/30/2014 °Elsevier Interactive Patient Education ©2016 [employer] ° °

## 2015-10-23 NOTE — ED Provider Notes (Signed)
CSN: [id number]     Arrival date & time 10/23/15  1509 History   First MD Initiated Contact with Patient 10/23/15 1517     Chief Complaint  Patient presents with  . Hip Injury    Probable dislocation      HPI Patient presents to the emergency department with complaints of possible right hip dislocation.  She has a history of right hip prosthesis placed by Dr. Maureen Ralphs in March 2017 followed by a revision of the right hip after 2 anterior dislocations in April 2017.  She has had no difficulty with the right hip since the new revision until today.  She was simply walking across the floor when this occurred suddenly.  She reports significant pain in her right hip.  She states this feels similar to her prior hip dislocations.  Her pain is severe in severity and worse with movement and palpation of her right hip.  Patient was given fentanyl by EMS prior to arrival.  She is still having ongoing pain at this time.  No head injury.  No neck pain.  No other complaints.  Is able to wiggle her toes on her right foot and complains of no numbness or weakness in the right lower extremity   Past Medical History  Diagnosis Date  . History of chicken pox   . Allergy     "seasonal allergies"  . Depression   . Hyperlipidemia   . Thyroid disease   . Abdominal pain 02/01/2015  . Scoliosis 02/01/2015  . URI, acute 03/10/2015  . 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 Right hip pain 05/24/2015  . Complication of anesthesia   . PONV (postoperative nausea and vomiting)   . Hypothyroidism     supplement used  . Frequent headaches     no problems now.  . Peripheral neuropathy ([place]) 02/01/2015    right hand"carpal tunnel"  . 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 Arthritis     osteoarthritis-"DDD" hips. fingers, toes.  . Hip dislocation, right [facility])    Past Surgical History  Procedure Laterality Date  . Tubal ligation  1980    left fallopian tube removal  .  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 Metacarpophalangeal joint capsulectomy / capsulotomy  1995  . Total hip arthroplasty Right 06/12/2015    Procedure: RIGHT TOTAL HIP ARTHROPLASTY ANTERIOR APPROACH;  Surgeon: Gaynelle Arabian, MD;  Location: [facility] OR[facility];  Service: Orthopedics;  Laterality: Right;  . Hip closed reduction Right 06/30/2015    Procedure: CLOSED MANIPULATION HIP;  Surgeon: Melina Schools, MD;  Location: [facility];  Service: Orthopedics;  Laterality: Right;  . Total hip revision Right 07/19/2015    Procedure: RIGHT HIP BEARING SURFACE REVISION;  Surgeon: Gaynelle Arabian, MD;  Location: [facility];  Service: Orthopedics;  Laterality: Right;   Family History  Problem Relation Age of Onset  . Arthritis Mother   . Dementia Mother   . Mental illness Father     suicide  . Arthritis Maternal Grandmother   . Kidney disease Son     b/l multicystic kidney disease s/p 4 transplants   Social History  Substance Use Topics  . Smoking status: Never Smoker   . Smokeless tobacco: None  . Alcohol Use: 0.0 oz/week    0 Standard drinks or equivalent per week     Comment: rare wine   OB History  No data available     Review of Systems  All other systems reviewed and are negative.     Allergies  Penicillins and Sulfa antibiotics  Home Medications   Prior to Admission medications   Medication Sig Start Date End Date Taking? Authorizing Provider  amoxicillin (AMOXIL) 500 MG capsule take 2 capsules by mouth immediately then take 1 capsule four times a day for DENTAL INFECTION 10/14/15  Yes Historical Provider, MD  cetirizine (ZYRTEC) 10 MG tablet Take 10 mg by mouth every morning.    Yes Historical Provider, MD  DULoxetine (CYMBALTA) 20 MG capsule Take 1 capsule (20 mg total) by mouth daily. 03/05/15  Yes Mosie Lukes, MD  etodolac (LODINE) 400 MG tablet Take 400 mg by mouth daily.  10/17/15  Yes Historical Provider, MD  furosemide (LASIX) 20 MG tablet Take 1 tablet (20 mg total) by mouth daily.  03/05/15  Yes Mosie Lukes, MD  gabapentin (NEURONTIN) 300 MG capsule Take 1 capsule (300 mg total) by mouth at bedtime. 03/05/15  Yes Mosie Lukes, MD  levothyroxine (SYNTHROID, LEVOTHROID) 50 MCG tablet Take 1 tablet (50 mcg total) by mouth daily. 03/05/15  Yes Mosie Lukes, MD  liothyronine (CYTOMEL) 25 MCG tablet Take 1 tablet (25 mcg total) by mouth daily. 03/05/15  Yes Mosie Lukes, MD  oxyCODONE (OXY IR/ROXICODONE) 5 MG immediate release tablet Take 1-2 tablets (5-10 mg total) by mouth every 3 (three) hours as needed for moderate pain or severe pain. 07/20/15  Yes Arlee Muslim, PA-C  acetaminophen (TYLENOL) 500 MG tablet Take 500 mg by mouth at bedtime as needed for moderate pain.     Historical Provider, MD  aspirin EC 325 MG EC tablet Take 1 tablet (325 mg total) by mouth daily. Take Aspirin 325 mg daily for three weeks and then reduce back to the 81 mg Aspirin dosing at home. Patient not taking: Reported on 10/23/2015 07/20/15   Arlee Muslim, PA-C  ciprofloxacin (CIPRO) 250 MG tablet Take 1 tablet (250 mg total) by mouth 2 (two) times daily. Patient not taking: Reported on 10/23/2015 08/07/15   Darreld Mclean, MD  DULoxetine (CYMBALTA) 20 MG capsule TAKE 1 CAPSULE DAILY Patient not taking: Reported on 10/23/2015 09/29/15   Mosie Lukes, MD  methocarbamol (ROBAXIN) 500 MG tablet Take 1 tablet (500 mg total) by mouth every 6 (six) hours as needed for muscle spasms. 07/20/15   Arlee Muslim, PA-C   BP 140/68 mmHg  Pulse 96  Temp(Src) 98.7 F (37.1 C) (Oral)  Resp 18  Ht 5\' 6"  (1.676 m)  Wt 98 lb (44.453 kg)  BMI 15.83 kg/m2  SpO2 96% Physical Exam  Constitutional: She is oriented to person, place, and time. She appears well-developed and well-nourished. No distress.  HENT:  Head: Normocephalic and atraumatic.  Eyes: EOM are normal.  Neck: Normal range of motion.  Cardiovascular: Normal rate, regular rhythm and normal heart sounds.   Pulmonary/Chest: Effort normal and breath  sounds normal.  Abdominal: Soft. She exhibits no distension. There is no tenderness.  Musculoskeletal:  Shortening and neck*rotation of the right lower extremity.  Pain with palpation of the right lateral hip with palpable deformity  Neurological: She is alert and oriented to person, place, and time.  Skin: Skin is warm and dry.  Psychiatric: She has a normal mood and affect. Judgment normal.  Nursing note and vitals reviewed.   ED Course  Reduction of dislocation Date/Time: 10/23/2015 5:28 PM Performed by: Jola Schmidt  Authorized by: Jola Schmidt  Consent: Verbal consent obtained. Risks and benefits: risks, benefits and alternatives were discussed Consent given by: patient Required items: required blood products, implants, devices, and special equipment available Time out: Immediately prior to procedure a "time out" was called to verify the correct patient, procedure, equipment, support staff and site/side marked as required. Patient sedated: yes Vitals: Vital signs were monitored during sedation. Patient tolerance: Patient tolerated the procedure well with no immediate complications. Joint: Right Hip Reduction technique: manipulation   Procedural sedation Performed by: Hoy Morn Consent: Verbal consent obtained. Risks and benefits: risks, benefits and alternatives were discussed Required items: required blood products, implants, devices, and special equipment available Patient identity confirmed: arm band and provided demographic data Time out: Immediately prior to procedure a "time out" was called to verify the correct patient, procedure, equipment, support staff and site/side marked as required. Sedation type: moderate (conscious) sedation NPO time confirmed and considered Sedatives: PROPOFOL Physician Time at Bedside: 15 Vitals: Vital signs were monitored during sedation. Cardiac Monitor, pulse oximeter Patient tolerance: Patient tolerated the procedure well with no  immediate complications. Comments: Pt with uneventful recovery. Returned to pre-procedural sedation baseline   ++++++++++++++++++++++++++++++++++++++++++++++++++++++++++++++++     Labs Review Labs Reviewed - No data to display  Imaging Review Dg Hip Unilat With Pelvis 2-3 Views Right  10/23/2015  CLINICAL DATA:  Hip pain with rotation, history of dislocations EXAM: DG HIP (WITH OR WITHOUT PELVIS) 2-3V RIGHT COMPARISON:  07/16/2015 FINDINGS: Osseous demineralization. BILATERAL hip prostheses. Anterior dislocation of the femoral component of the RIGHT hip prosthesis from the acetabular cup. No acute fractures identified. Healed fracture of the inferior RIGHT pubic ramus. Remainder of pelvis appears intact. IMPRESSION: Dislocated RIGHT hip prosthesis. Osseous demineralization with old healed inferior RIGHT pubic ramus fracture. Electronically Signed   By: Lavonia Dana M.D.   On: 10/23/2015 16:24   I have personally reviewed and evaluated these images and lab results as part of my medical decision-making.   EKG Interpretation None      MDM   Final diagnoses:  Status post closed reduction of dislocated total hip prosthesis  Right hip pain  Dislocated hip, right, initial encounter [facility])    Patient tolerated the procedure well.  Closed hip reduction of the right hip.  Patient needs outpatient orthopedic follow-up with Dr Maureen Ralphs.  Home with a knee immobilizer short course pain medication.    Jola Schmidt, MD 10/23/15 [phone]

## 2015-10-23 NOTE — ED Notes (Signed)
Pt is presenting from home, hip injury with rotation, chart review indicates same hip that was had several surgeries on. Received 250 mcg of Fentanyl IV from EMS.

## 2015-10-23 NOTE — ED Notes (Signed)
Patient transported to X-ray 

## 2015-10-23 NOTE — ED Notes (Signed)
Bed: WA21 Expected date:  Expected time:  Means of arrival:  Comments: EMS-dislocated hip

## 2015-10-25 DIAGNOSIS — Z471 Aftercare following joint replacement surgery: Secondary | ICD-10-CM | POA: Diagnosis not present

## 2015-10-25 DIAGNOSIS — Z96641 Presence of right artificial hip joint: Secondary | ICD-10-CM | POA: Diagnosis not present

## 2015-10-28 ENCOUNTER — Ambulatory Visit: Payer: Self-pay | Admitting: Orthopedic Surgery

## 2015-10-28 ENCOUNTER — Other Ambulatory Visit (HOSPITAL_COMMUNITY): Payer: Self-pay | Admitting: Orthopedic Surgery

## 2015-10-28 NOTE — Patient Instructions (Addendum)
Misty Barker  10/28/2015                              Misty Barker  10/28/2015   Your procedure is scheduled on: 10/30/15  Report to [facility]  Entrance take [place]  elevators to 3rd floor to  [place] at 2:45 PM  Call this number if you have problems the morning of surgery [phone]   Remember: ONLY 1 PERSON MAY GO WITH YOU TO SHORT STAY TO GET  READY MORNING OF YOUR SURGERY.  Do not eat food :After Midnight.               MAY HAVE CLEAR LIQUIDS UNTIL 10:45 AM MORNING OF SURGERY-THEN NOTHING BY MOUTH     Take these medicines the morning of surgery with A SIP OF WATER: Cymbalta,  Levothyroxine, Liothyroxine, Zyrtec MAY TAKE Robaxin, Oxycodone if neeeded DO NOT TAKE ANY DIABETIC MEDICATIONS DAY OF YOUR SURGERY                               You may not have any metal on your body including hair pins and              piercings  Do not wear jewelry, make-up, lotions, powders or perfumes, deodorant             Do not wear nail polish.  Do not shave  48 hours prior to surgery.              Men may shave face and neck.   Do not bring valuables to the hospital. [place].  Contacts, dentures or bridgework may not be worn into surgery.  Leave suitcase in the car. After surgery it may be brought to your room.   ________________________________________             [facility] - Preparing for Surgery Before surgery, you can play an important role.  Because skin is not sterile, your skin needs to be as free of germs as possible.  You can reduce the number of germs on your skin by washing with CHG (chlorahexidine gluconate) soap before surgery.  CHG is an antiseptic cleaner which kills germs and bonds with the skin to continue killing germs even after washing. Please DO NOT use if you have an allergy to CHG or antibacterial soaps.  If your skin becomes reddened/irritated stop  using the CHG and inform your nurse when you arrive at Short Stay. Do not shave (including legs and underarms) for at least 48 hours prior to the first CHG shower.  You may shave your face/neck. Please follow these instructions carefully:  1.  Shower with CHG Soap the night before surgery and the  morning of Surgery.  2.  If you choose to wash your hair, wash your hair first as usual with your  normal  shampoo.  3.  After you shampoo, rinse your hair and body thoroughly to remove the  shampoo.                           4.  Use CHG as you would  any other liquid soap.  You can apply chg directly  to the skin and wash                       Gently with a scrungie or clean washcloth.  5.  Apply the CHG Soap to your body ONLY FROM THE NECK DOWN.   Do not use on face/ open                           Wound or open sores. Avoid contact with eyes, ears mouth and genitals (private parts).                       Wash face,  Genitals (private parts) with your normal soap.             6.  Wash thoroughly, paying special attention to the area where your surgery  will be performed.  7.  Thoroughly rinse your body with warm water from the neck down.  8.  DO NOT shower/wash with your normal soap after using and rinsing off  the CHG Soap.                9.  Pat yourself dry with a clean towel.            10.  Wear clean pajamas.            11.  Place clean sheets on your bed the night of your first shower and do not  sleep with pets. Day of Surgery : Do not apply any lotions/deodorants the morning of surgery.  Please wear clean clothes to the hospital/surgery center.  FAILURE TO FOLLOW THESE INSTRUCTIONS MAY RESULT IN THE CANCELLATION OF YOUR SURGERY PATIENT SIGNATURE_________________________________  NURSE SIGNATURE__________________________________  ________________________________________________________________________    CLEAR LIQUID DIET   Foods Allowed                                                                      Foods Excluded  Coffee and tea, regular and decaf                             liquids that you cannot  Plain Jell-O in any flavor                                             see through such as: Fruit ices (not with fruit pulp)                                     milk, soups, orange juice  Iced Popsicles                                    All solid food Carbonated beverages, regular and diet  Cranberry, grape and apple juices Sports drinks like Gatorade Lightly seasoned clear broth or consume(fat free) Sugar, honey syrup  Sample Menu Breakfast                                Lunch                                     Supper Cranberry juice                    Beef broth                            Chicken broth Jell-O                                     Grape juice                           Apple juice Coffee or tea                        Jell-O                                      Popsicle                                                Coffee or tea                        Coffee or tea  _____________________________________________________________________   WHAT IS A BLOOD TRANSFUSION? Blood Transfusion Information  A transfusion is the replacement of blood or some of its parts. Blood is made up of multiple cells which provide different functions.  Red blood cells carry oxygen and are used for blood loss replacement.  White blood cells fight against infection.  Platelets control bleeding.  Plasma helps clot blood.  Other blood products are available for specialized needs, such as hemophilia or other clotting disorders. BEFORE THE TRANSFUSION  Who gives blood for transfusions?   Healthy volunteers who are fully evaluated to make sure their blood is safe. This is blood bank blood. Transfusion therapy is the safest it has ever been in the practice of medicine. Before blood is taken from a donor, a complete history is taken to make  sure that person has no history of diseases nor engages in risky social behavior (examples are intravenous drug use or sexual activity with multiple partners). The donor's travel history is screened to minimize risk of transmitting infections, such as malaria. The donated blood is tested for signs of infectious diseases, such as HIV and hepatitis. The blood is then tested to be sure it is compatible with you in order to minimize the chance of a transfusion reaction. If you or a relative donates blood, this is often done in anticipation of surgery and is not appropriate for emergency situations. It takes many days to process the donated blood.  RISKS AND COMPLICATIONS Although transfusion therapy is very safe and saves many lives, the main dangers of transfusion include:  1. Getting an infectious disease. 2. Developing a transfusion reaction. This is an allergic reaction to something in the blood you were given. Every precaution is taken to prevent this. The decision to have a blood transfusion has been considered carefully by your caregiver before blood is given. Blood is not given unless the benefits outweigh the risks. AFTER THE TRANSFUSION  Right after receiving a blood transfusion, you will usually feel much better and more energetic. This is especially true if your red blood cells have gotten low (anemic). The transfusion raises the level of the red blood cells which carry oxygen, and this usually causes an energy increase.  The nurse administering the transfusion will monitor you carefully for complications. HOME CARE INSTRUCTIONS  No special instructions are needed after a transfusion. You may find your energy is better. Speak with your caregiver about any limitations on activity for underlying diseases you may have. SEEK MEDICAL CARE IF:   Your condition is not improving after your transfusion.  You develop redness or irritation at the intravenous (IV) site. SEEK IMMEDIATE MEDICAL CARE IF:   Any of the following symptoms occur over the next 12 hours:  Shaking chills.  You have a temperature by mouth above 102 F (38.9 C), not controlled by medicine.  Chest, back, or muscle pain.  People around you feel you are not acting correctly or are confused.  Shortness of breath or difficulty breathing.  Dizziness and fainting.  You get a rash or develop hives.  You have a decrease in urine output.  Your urine turns a dark color or changes to pink, red, or brown. Any of the following symptoms occur over the next 10 days:  You have a temperature by mouth above 102 F (38.9 C), not controlled by medicine.  Shortness of breath.  Weakness after normal activity.  The white part of the eye turns yellow (jaundice).  You have a decrease in the amount of urine or are urinating less often.  Your urine turns a dark color or changes to pink, red, or brown. Document Released: 03/27/2000 Document Revised: 06/22/2011 Document Reviewed: 11/14/2007 ExitCare Patient Information 2014 [place].  _______________________________________________________________________  Incentive Spirometer  An incentive spirometer is a tool that can help keep your lungs clear and active. This tool measures how well you are filling your lungs with each breath. Taking long deep breaths may help reverse or decrease the chance of developing breathing (pulmonary) problems (especially infection) following:  A long period of time when you are unable to move or be active. BEFORE THE PROCEDURE   If the spirometer includes an indicator to show your best effort, your nurse or respiratory therapist will set it to a desired goal.  If possible, sit up straight or lean slightly forward. Try not to slouch.  Hold the incentive spirometer in an upright position. INSTRUCTIONS FOR USE  3. Sit on the edge of your bed if possible, or sit up as far as you can in bed or on a chair. 4. Hold the incentive spirometer  in an upright position. 5. Breathe out normally. 6. Place the mouthpiece in your mouth and seal your lips tightly around it. 7. Breathe in slowly and as deeply as possible, raising the piston or the ball toward the top of the column. 8. Hold your breath for 3-5 seconds or for as long as possible. Allow the piston  or ball to fall to the bottom of the column. 9. Remove the mouthpiece from your mouth and breathe out normally. 10. Rest for a few seconds and repeat Steps 1 through 7 at least 10 times every 1-2 hours when you are awake. Take your time and take a few normal breaths between deep breaths. 11. The spirometer may include an indicator to show your best effort. Use the indicator as a goal to work toward during each repetition. 12. After each set of 10 deep breaths, practice coughing to be sure your lungs are clear. If you have an incision (the cut made at the time of surgery), support your incision when coughing by placing a pillow or rolled up towels firmly against it. Once you are able to get out of bed, walk around indoors and cough well. You may stop using the incentive spirometer when instructed by your caregiver.  RISKS AND COMPLICATIONS  Take your time so you do not get dizzy or light-headed.  If you are in pain, you may need to take or ask for pain medication before doing incentive spirometry. It is harder to take a deep breath if you are having pain. AFTER USE  Rest and breathe slowly and easily.  It can be helpful to keep track of a log of your progress. Your caregiver can provide you with a simple table to help with this. If you are using the spirometer at home, follow these instructions: [place] IF:   You are having difficultly using the spirometer.  You have trouble using the spirometer as often as instructed.  Your pain medication is not giving enough relief while using the spirometer.  You develop fever of 100.5 F (38.1 C) or higher. SEEK IMMEDIATE MEDICAL  CARE IF:   You cough up bloody sputum that had not been present before.  You develop fever of 102 F (38.9 C) or greater.  You develop worsening pain at or near the incision site. MAKE SURE YOU:   Understand these instructions.  Will watch your condition.  Will get help right away if you are not doing well or get worse. Document Released: 08/10/2006 Document Revised: 06/22/2011 Document Reviewed: 10/11/2006 [facility] Patient Information 2014 [place], [place].   ________________________________________________________________________

## 2015-10-28 NOTE — Progress Notes (Signed)
ekg 06/28/15 epic

## 2015-10-28 NOTE — Progress Notes (Signed)
DREW-- NEEP PRE OP ORDERS IN EPIC PLEASE--HAS APPT HERE  10/29/15 0800                THANKS

## 2015-10-29 ENCOUNTER — Encounter (HOSPITAL_COMMUNITY)
Admission: RE | Admit: 2015-10-29 | Discharge: 2015-10-29 | Disposition: A | Discharge status: Home / Self Care | Payer: Medicare Other | Source: Ambulatory Visit | Attending: Orthopedic Surgery | Admitting: Orthopedic Surgery

## 2015-10-29 ENCOUNTER — Encounter (HOSPITAL_COMMUNITY): Payer: Self-pay

## 2015-10-29 LAB — CBC
HCT: 38.6 % (ref 36.0–46.0)
HEMOGLOBIN: 12.2 g/dL (ref 12.0–15.0)
MCH: 23.3 pg — ABNORMAL LOW (ref 26.0–34.0)
MCHC: 31.6 g/dL (ref 30.0–36.0)
MCV: 73.7 fL — ABNORMAL LOW (ref 78.0–100.0)
Platelets: 411 10*3/uL — ABNORMAL HIGH (ref 150–400)
RBC: 5.24 MIL/uL — ABNORMAL HIGH (ref 3.87–5.11)
RDW: 16 % — AB (ref 11.5–15.5)
WBC: 8.4 10*3/uL (ref 4.0–10.5)

## 2015-10-29 LAB — COMPREHENSIVE METABOLIC PANEL
ALBUMIN: 4 g/dL (ref 3.5–5.0)
ALK PHOS: 70 U/L (ref 38–126)
ALT: 17 U/L (ref 14–54)
ANION GAP: 7 (ref 5–15)
AST: 24 U/L (ref 15–41)
BUN: 10 mg/dL (ref 6–20)
CO2: 30 mmol/L (ref 22–32)
Calcium: 9.8 mg/dL (ref 8.9–10.3)
Chloride: 101 mmol/L (ref 101–111)
Creatinine, Ser: 0.65 mg/dL (ref 0.44–1.00)
GFR calc Af Amer: >60 mL/min (ref >60)
GFR calc non Af Amer: >60 mL/min (ref >60)
GLUCOSE: 95 mg/dL (ref 65–99)
POTASSIUM: 4 mmol/L (ref 3.5–5.1)
SODIUM: 138 mmol/L (ref 135–145)
Total Bilirubin: 0.3 mg/dL (ref 0.3–1.2)
Total Protein: 7 g/dL (ref 6.5–8.1)

## 2015-10-29 LAB — URINALYSIS, ROUTINE W REFLEX MICROSCOPIC
BILIRUBIN URINE: NEGATIVE
Glucose, UA: NEGATIVE mg/dL
HGB URINE DIPSTICK: NEGATIVE
KETONES UR: NEGATIVE mg/dL
Leukocytes, UA: NEGATIVE
NITRITE: NEGATIVE
PROTEIN: NEGATIVE mg/dL
Specific Gravity, Urine: 1.006 (ref 1.005–1.030)
pH: 7 (ref 5.0–8.0)

## 2015-10-29 LAB — SURGICAL PCR SCREEN
MRSA, PCR: NEGATIVE
STAPHYLOCOCCUS AUREUS: NEGATIVE

## 2015-10-29 LAB — APTT: APTT: 27 s (ref 24–37)

## 2015-10-29 LAB — PROTIME-INR
INR: 0.95 (ref 0.00–1.49)
Prothrombin Time: 12.5 seconds (ref 11.6–15.2)

## 2015-10-29 NOTE — Progress Notes (Signed)
States at pre op visit that she has completed two week course of antibotics for infected root canal. States "is all better now". Stated she told this to Psychologist, sport and exercise.

## 2015-10-29 NOTE — H&P (Signed)
Misty Barker is an 70 y.o. female.   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 Her initial surgery was on 06/12/15 and she had 2 early anterior dislocations leading to revision surgery on 4/7 which involved upsizing to a 32 mm head from a 28 mm head. She did well until last week when she sustained an atraumatic anterior dislocation. It is felt that the problem is due to soft tissue laxity as she has well positioned components. We discussed treatment options and will proceed with acetabular revision to a constrained liner via a posterior approach. I told her that this would allow another perspective on the component position and allow for revision if I find one of the components in a non-ideal position. If the components are well positioned then we will proceed with a constrained liner.  Past Medical History  Diagnosis Date  . History of chicken pox   . Allergy     "seasonal allergies"  . Depression   . Hyperlipidemia   . Thyroid disease   . Abdominal pain 02/01/2015  . Scoliosis 02/01/2015  . URI, acute 03/10/2015  . 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 Right hip pain 05/24/2015  . Complication of anesthesia   . PONV (postoperative nausea and vomiting)   . Hypothyroidism     supplement used  . Frequent headaches     no problems now.  . Peripheral neuropathy ([place]) 02/01/2015    right hand"carpal tunnel"  . 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 Arthritis     osteoarthritis-"DDD" hips. fingers, toes.  . Hip dislocation, right [facility])     Past Surgical History  Procedure Laterality Date  . Tubal ligation  1980    left fallopian tube removal  . 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 Metacarpophalangeal joint capsulectomy / capsulotomy  1995  . Total hip arthroplasty Right 06/12/2015    Procedure: RIGHT TOTAL HIP ARTHROPLASTY ANTERIOR APPROACH;   Surgeon: Gaynelle Arabian, MD;  Location: [facility] ORS;  Service: Orthopedics;  Laterality: Right;  . Hip closed reduction Right 06/30/2015    Procedure: CLOSED MANIPULATION HIP;  Surgeon: Melina Schools, MD;  Location: [facility]S;  Service: Orthopedics;  Laterality: Right;  . Total hip revision Right 07/19/2015    Procedure: RIGHT HIP BEARING SURFACE REVISION;  Surgeon: Gaynelle Arabian, MD;  Location: [facility];  Service: Orthopedics;  Laterality: Right;    Family History  Problem Relation Age of Onset  . Arthritis Mother   . Dementia Mother   . Mental illness Father     suicide  . Arthritis Maternal Grandmother   . Kidney disease Son     b/l multicystic kidney disease s/p 4 transplants   Social History:  reports that she has never smoked. She has never used smokeless tobacco. She reports that she drinks alcohol. She reports that she does not use illicit drugs.  Allergies:  Allergies  Allergen Reactions  . Penicillins Hives    Has patient had a PCN reaction causing immediate rash, facial/tongue/throat swelling, SOB or lightheadedness with hypotension: No Has patient had a PCN reaction causing severe rash involving mucus membranes or skin necrosis: No Has patient had a PCN reaction that required hospitalization No Has patient had a PCN reaction occurring within the last 10 years: No If all of the above answers  are "NO", then may proceed with Cephalosporin use.  . Sulfa Antibiotics Hives    No prescriptions prior to admission    Results for orders placed or performed during the hospital encounter of 10/29/15 (from the past 48 hour(s))  Urinalysis, Routine w reflex microscopic (not at [facility])     Status: None   Collection Time: 10/29/15  8:31 AM  Result Value Ref Range   Color, Urine YELLOW YELLOW   APPearance CLEAR CLEAR   Specific Gravity, Urine 1.006 1.005 - 1.030   pH 7.0 5.0 - 8.0   Glucose, UA NEGATIVE NEGATIVE mg/dL   Hgb urine dipstick NEGATIVE NEGATIVE   Bilirubin Urine NEGATIVE NEGATIVE    Ketones, ur NEGATIVE NEGATIVE mg/dL   Protein, ur NEGATIVE NEGATIVE mg/dL   Nitrite NEGATIVE NEGATIVE   Leukocytes, UA NEGATIVE NEGATIVE    Comment: MICROSCOPIC NOT DONE ON URINES WITH NEGATIVE PROTEIN, BLOOD, LEUKOCYTES, NITRITE, OR GLUCOSE <1000 mg/dL.  Surgical pcr screen     Status: None   Collection Time: 10/29/15  8:32 AM  Result Value Ref Range   MRSA, PCR NEGATIVE NEGATIVE   Staphylococcus aureus NEGATIVE NEGATIVE    Comment:        The Xpert SA Assay (FDA approved for NASAL specimens in patients over 66 years of age), is one component of a comprehensive surveillance program.  Test performance has been validated by [facility] for patients greater than or equal to 42 year old. It is not intended to diagnose infection nor to guide or monitor treatment.   APTT     Status: None   Collection Time: 10/29/15  9:00 AM  Result Value Ref Range   aPTT 27 24 - 37 seconds  CBC     Status: Abnormal   Collection Time: 10/29/15  9:00 AM  Result Value Ref Range   WBC 8.4 4.0 - 10.5 K/uL   RBC 5.24 (H) 3.87 - 5.11 MIL/uL   Hemoglobin 12.2 12.0 - 15.0 g/dL   HCT 38.6 36.0 - 46.0 %   MCV 73.7 (L) 78.0 - 100.0 fL   MCH 23.3 (L) 26.0 - 34.0 pg   MCHC 31.6 30.0 - 36.0 g/dL   RDW 16.0 (H) 11.5 - 15.5 %   Platelets 411 (H) 150 - 400 K/uL  Comprehensive metabolic panel     Status: None   Collection Time: 10/29/15  9:00 AM  Result Value Ref Range   Sodium 138 135 - 145 mmol/L   Potassium 4.0 3.5 - 5.1 mmol/L   Chloride 101 101 - 111 mmol/L   CO2 30 22 - 32 mmol/L   Glucose, Bld 95 65 - 99 mg/dL   BUN 10 6 - 20 mg/dL   Creatinine, Ser 0.65 0.44 - 1.00 mg/dL   Calcium 9.8 8.9 - 10.3 mg/dL   Total Protein 7.0 6.5 - 8.1 g/dL   Albumin 4.0 3.5 - 5.0 g/dL   AST 24 15 - 41 U/L   ALT 17 14 - 54 U/L   Alkaline Phosphatase 70 38 - 126 U/L   Total Bilirubin 0.3 0.3 - 1.2 mg/dL   GFR calc non Af Amer >60 >60 mL/min   GFR calc Af Amer >60 >60 mL/min    Comment: (NOTE) The eGFR has been  calculated using the CKD EPI equation. This calculation has not been validated in all clinical situations. eGFR's persistently <60 mL/min signify possible Chronic Kidney Disease.    Anion gap 7 5 - 15  Protime-INR  Status: None   Collection Time: 10/29/15  9:00 AM  Result Value Ref Range   Prothrombin Time 12.5 11.6 - 15.2 seconds   INR 0.95 0.00 - 1.49  Type and screen Order type and screen if day of surgery is less than 15 days from draw of preadmission visit or order morning of surgery if day of surgery is greater than 6 days from preadmission visit.     Status: None   Collection Time: 10/29/15  9:00 AM  Result Value Ref Range   ABO/RH(D) AB POS    Antibody Screen NEG    Sample Expiration 11/12/2015    Extend sample reason NO TRANSFUSIONS OR PREGNANCY IN THE PAST 3 MONTHS    No results found.  ROS  There were no vitals taken for this visit. Physical Exam Physical Examination: General appearance - alert, well appearing, and in no distress Mental status - alert, oriented to person, place, and time Chest - clear to auscultation, no wheezes, rales or rhonchi, symmetric air entry Heart - normal rate, regular rhythm, normal S1, S2, no murmurs, rubs, clicks or gallops Abdomen - soft, nontender, nondistended, no masses or organomegaly Neurological - alert, oriented, normal speech, no focal findings or movement disorder noted   Assessment/Plan Right hip instability- Plan revision to constrained acetabular liner. Discussed in detail with patient who elects to proceed.  Gearlean Alf, MD 10/29/2015, 6:06 PM

## 2015-10-30 ENCOUNTER — Inpatient Hospital Stay (HOSPITAL_COMMUNITY): Payer: Medicare Other

## 2015-10-30 ENCOUNTER — Encounter (HOSPITAL_COMMUNITY): Payer: Self-pay | Admitting: *Deleted

## 2015-10-30 ENCOUNTER — Inpatient Hospital Stay (HOSPITAL_COMMUNITY): Payer: Medicare Other | Admitting: Certified Registered Nurse Anesthetist

## 2015-10-30 ENCOUNTER — Encounter (HOSPITAL_COMMUNITY)
Admission: RE | Disposition: A | Discharge status: Home Health | Payer: Self-pay | Source: Ambulatory Visit | Attending: Orthopedic Surgery

## 2015-10-30 ENCOUNTER — Inpatient Hospital Stay (HOSPITAL_COMMUNITY)
Admission: RE | Admit: 2015-10-30 | Discharge: 2015-11-02 | DRG: 467 | Disposition: A | Discharge status: Home Health | Payer: Medicare Other | Source: Ambulatory Visit | Attending: Orthopedic Surgery | Admitting: Orthopedic Surgery

## 2015-10-30 DIAGNOSIS — T84020A Dislocation of internal right hip prosthesis, initial encounter: Secondary | ICD-10-CM | POA: Diagnosis not present

## 2015-10-30 DIAGNOSIS — E039 Hypothyroidism, unspecified: Secondary | ICD-10-CM | POA: Diagnosis present

## 2015-10-30 DIAGNOSIS — M25352 Other instability, left hip: Secondary | ICD-10-CM | POA: Diagnosis not present

## 2015-10-30 DIAGNOSIS — H9191 Unspecified hearing loss, right ear: Secondary | ICD-10-CM | POA: Diagnosis present

## 2015-10-30 DIAGNOSIS — R636 Underweight: Secondary | ICD-10-CM | POA: Diagnosis present

## 2015-10-30 DIAGNOSIS — T84010A Broken internal right hip prosthesis, initial encounter: Secondary | ICD-10-CM | POA: Diagnosis not present

## 2015-10-30 DIAGNOSIS — Z8261 Family history of arthritis: Secondary | ICD-10-CM

## 2015-10-30 DIAGNOSIS — T8489XA Other specified complication of internal orthopedic prosthetic devices, implants and grafts, initial encounter: Secondary | ICD-10-CM | POA: Diagnosis not present

## 2015-10-30 DIAGNOSIS — M25351 Other instability, right hip: Principal | ICD-10-CM | POA: Diagnosis present

## 2015-10-30 DIAGNOSIS — Z681 Body mass index (BMI) 19 or less, adult: Secondary | ICD-10-CM | POA: Diagnosis not present

## 2015-10-30 DIAGNOSIS — Z471 Aftercare following joint replacement surgery: Secondary | ICD-10-CM | POA: Diagnosis not present

## 2015-10-30 DIAGNOSIS — Z96649 Presence of unspecified artificial hip joint: Secondary | ICD-10-CM

## 2015-10-30 DIAGNOSIS — D62 Acute posthemorrhagic anemia: Secondary | ICD-10-CM | POA: Diagnosis not present

## 2015-10-30 DIAGNOSIS — Z96641 Presence of right artificial hip joint: Secondary | ICD-10-CM | POA: Diagnosis present

## 2015-10-30 DIAGNOSIS — T84019A Broken internal joint prosthesis, unspecified site, initial encounter: Secondary | ICD-10-CM | POA: Diagnosis not present

## 2015-10-30 DIAGNOSIS — T84028A Dislocation of other internal joint prosthesis, initial encounter: Secondary | ICD-10-CM

## 2015-10-30 HISTORY — PX: ACETABULAR REVISION: SHX5712

## 2015-10-30 LAB — PREPARE RBC (CROSSMATCH)

## 2015-10-30 SURGERY — REVISION, TOTAL ARTHROPLASTY, HIP, ACETABULAR COMPONENT
Anesthesia: Spinal | Site: Hip | Laterality: Right

## 2015-10-30 MED ORDER — ACETAMINOPHEN 10 MG/ML IV SOLN
1000.0000 mg | Freq: Once | INTRAVENOUS | Status: AC
  Administered 2015-10-30: 1000 mg via INTRAVENOUS
  Filled 2015-10-30: qty 100

## 2015-10-30 MED ORDER — PROPOFOL 10 MG/ML IV BOLUS
INTRAVENOUS | Status: AC
  Filled 2015-10-30: qty 20

## 2015-10-30 MED ORDER — LACTATED RINGERS IV SOLN
INTRAVENOUS | Status: DC
  Administered 2015-10-30 (×3): via INTRAVENOUS

## 2015-10-30 MED ORDER — SODIUM CHLORIDE 0.9 % IV SOLN
INTRAVENOUS | Status: DC
  Administered 2015-10-30 – 2015-10-31 (×2): via INTRAVENOUS

## 2015-10-30 MED ORDER — ONDANSETRON HCL 4 MG PO TABS
4.0000 mg | ORAL_TABLET | Freq: Four times a day (QID) | ORAL | Status: DC | PRN
  Filled 2015-10-30: qty 1

## 2015-10-30 MED ORDER — METOCLOPRAMIDE HCL 5 MG/ML IJ SOLN: 5.0000 mg | Freq: Three times a day (TID) | INTRAMUSCULAR | Status: DC | PRN

## 2015-10-30 MED ORDER — STERILE WATER FOR IRRIGATION IR SOLN
Status: DC | PRN
  Administered 2015-10-30: 1000 mL

## 2015-10-30 MED ORDER — CHLORHEXIDINE GLUCONATE 4 % EX LIQD
60.0000 mL | Freq: Once | CUTANEOUS | Status: AC
  Administered 2015-10-30: 4 via TOPICAL

## 2015-10-30 MED ORDER — HYDROMORPHONE HCL 1 MG/ML IJ SOLN
INTRAMUSCULAR | Status: AC
  Filled 2015-10-30: qty 1

## 2015-10-30 MED ORDER — POLYETHYLENE GLYCOL 3350 17 G PO PACK: 17.0000 g | PACK | Freq: Every day | ORAL | Status: DC | PRN

## 2015-10-30 MED ORDER — VANCOMYCIN HCL IN DEXTROSE 1-5 GM/200ML-% IV SOLN
1000.0000 mg | INTRAVENOUS | Status: AC
  Administered 2015-10-30: 1000 mg via INTRAVENOUS
  Filled 2015-10-30: qty 200

## 2015-10-30 MED ORDER — PROPOFOL 500 MG/50ML IV EMUL
INTRAVENOUS | Status: DC | PRN
  Administered 2015-10-30: 75 ug/kg/min via INTRAVENOUS

## 2015-10-30 MED ORDER — BUPIVACAINE IN DEXTROSE 0.75-8.25 % IT SOLN
INTRATHECAL | Status: DC | PRN
  Administered 2015-10-30: 13.5 mg via INTRATHECAL

## 2015-10-30 MED ORDER — LIDOCAINE HCL (CARDIAC) 20 MG/ML IV SOLN
INTRAVENOUS | Status: AC
  Filled 2015-10-30: qty 5

## 2015-10-30 MED ORDER — PHENYLEPHRINE HCL 10 MG/ML IJ SOLN
INTRAMUSCULAR | Status: DC | PRN
  Administered 2015-10-30 (×5): 40 ug via INTRAVENOUS
  Administered 2015-10-30 (×2): 80 ug via INTRAVENOUS

## 2015-10-30 MED ORDER — HYDROMORPHONE HCL 1 MG/ML IJ SOLN
0.2500 mg | INTRAMUSCULAR | Status: DC | PRN
  Administered 2015-10-30 (×5): 0.25 mg via INTRAVENOUS

## 2015-10-30 MED ORDER — PROMETHAZINE HCL 25 MG/ML IJ SOLN: 6.2500 mg | INTRAMUSCULAR | Status: DC | PRN

## 2015-10-30 MED ORDER — ASPIRIN EC 325 MG PO TBEC
325.0000 mg | DELAYED_RELEASE_TABLET | Freq: Every day | ORAL | Status: DC
  Administered 2015-10-31 – 2015-11-02 (×3): 325 mg via ORAL
  Filled 2015-10-30 (×3): qty 1

## 2015-10-30 MED ORDER — ACETAMINOPHEN 650 MG RE SUPP: 650.0000 mg | Freq: Four times a day (QID) | RECTAL | Status: DC | PRN

## 2015-10-30 MED ORDER — MORPHINE SULFATE (PF) 2 MG/ML IV SOLN
1.0000 mg | INTRAVENOUS | Status: DC | PRN
  Administered 2015-10-30 – 2015-10-31 (×2): 1 mg via INTRAVENOUS
  Filled 2015-10-30 (×2): qty 1

## 2015-10-30 MED ORDER — MIDAZOLAM HCL 2 MG/2ML IJ SOLN: 0.5000 mg | Freq: Once | INTRAMUSCULAR | Status: DC | PRN

## 2015-10-30 MED ORDER — 0.9 % SODIUM CHLORIDE (POUR BTL) OPTIME
TOPICAL | Status: DC | PRN
  Administered 2015-10-30: 1000 mL

## 2015-10-30 MED ORDER — DEXAMETHASONE SODIUM PHOSPHATE 10 MG/ML IJ SOLN
10.0000 mg | Freq: Once | INTRAMUSCULAR | Status: AC
  Administered 2015-10-31: 10 mg via INTRAVENOUS
  Filled 2015-10-30: qty 1

## 2015-10-30 MED ORDER — PHENYLEPHRINE 40 MCG/ML (10ML) SYRINGE FOR IV PUSH (FOR BLOOD PRESSURE SUPPORT)
PREFILLED_SYRINGE | INTRAVENOUS | Status: AC
  Filled 2015-10-30: qty 10

## 2015-10-30 MED ORDER — METHOCARBAMOL 1000 MG/10ML IJ SOLN
500.0000 mg | Freq: Four times a day (QID) | INTRAVENOUS | Status: DC | PRN
  Administered 2015-10-30 – 2015-10-31 (×3): 500 mg via INTRAVENOUS
  Filled 2015-10-30: qty 550
  Filled 2015-10-30 (×3): qty 5
  Filled 2015-10-30: qty 550
  Filled 2015-10-30: qty 5

## 2015-10-30 MED ORDER — HYDROMORPHONE HCL 1 MG/ML IJ SOLN
INTRAMUSCULAR | Status: AC
  Administered 2015-10-30: 0.25 mg via INTRAVENOUS
  Filled 2015-10-30: qty 1

## 2015-10-30 MED ORDER — FENTANYL CITRATE (PF) 100 MCG/2ML IJ SOLN
INTRAMUSCULAR | Status: AC
  Filled 2015-10-30: qty 2

## 2015-10-30 MED ORDER — LEVOTHYROXINE SODIUM 50 MCG PO TABS
50.0000 ug | ORAL_TABLET | Freq: Every day | ORAL | Status: DC
  Administered 2015-10-31 – 2015-11-02 (×3): 50 ug via ORAL
  Filled 2015-10-30 (×3): qty 1

## 2015-10-30 MED ORDER — LIOTHYRONINE SODIUM 25 MCG PO TABS
25.0000 ug | ORAL_TABLET | Freq: Every day | ORAL | Status: DC
  Administered 2015-10-31 – 2015-11-02 (×3): 25 ug via ORAL
  Filled 2015-10-30 (×3): qty 1

## 2015-10-30 MED ORDER — DOCUSATE SODIUM 100 MG PO CAPS
100.0000 mg | ORAL_CAPSULE | Freq: Two times a day (BID) | ORAL | Status: DC
  Administered 2015-10-31 – 2015-11-02 (×5): 100 mg via ORAL
  Filled 2015-10-30 (×5): qty 1

## 2015-10-30 MED ORDER — OXYCODONE HCL 5 MG PO TABS
5.0000 mg | ORAL_TABLET | ORAL | Status: DC | PRN
  Administered 2015-10-31 – 2015-11-02 (×13): 10 mg via ORAL
  Filled 2015-10-30 (×14): qty 2

## 2015-10-30 MED ORDER — FLEET ENEMA 7-19 GM/118ML RE ENEM: 1.0000 | ENEMA | Freq: Once | RECTAL | Status: DC | PRN

## 2015-10-30 MED ORDER — LORATADINE 10 MG PO TABS
10.0000 mg | ORAL_TABLET | Freq: Every day | ORAL | Status: DC
  Administered 2015-10-31 – 2015-11-02 (×3): 10 mg via ORAL
  Filled 2015-10-30 (×3): qty 1

## 2015-10-30 MED ORDER — FENTANYL CITRATE (PF) 100 MCG/2ML IJ SOLN
INTRAMUSCULAR | Status: DC | PRN
  Administered 2015-10-30 (×2): 50 ug via INTRAVENOUS

## 2015-10-30 MED ORDER — BUPIVACAINE HCL 0.25 % IJ SOLN
INTRAMUSCULAR | Status: DC | PRN
  Administered 2015-10-30: 30 mL

## 2015-10-30 MED ORDER — BISACODYL 10 MG RE SUPP: 10.0000 mg | Freq: Every day | RECTAL | Status: DC | PRN

## 2015-10-30 MED ORDER — GABAPENTIN 300 MG PO CAPS
300.0000 mg | ORAL_CAPSULE | Freq: Every day | ORAL | Status: DC
  Administered 2015-10-31 – 2015-11-01 (×2): 300 mg via ORAL
  Filled 2015-10-30 (×2): qty 1

## 2015-10-30 MED ORDER — METOCLOPRAMIDE HCL 5 MG PO TABS: 5.0000 mg | ORAL_TABLET | Freq: Three times a day (TID) | ORAL | Status: DC | PRN

## 2015-10-30 MED ORDER — METHOCARBAMOL 500 MG PO TABS
500.0000 mg | ORAL_TABLET | Freq: Four times a day (QID) | ORAL | Status: DC | PRN
  Administered 2015-11-01 – 2015-11-02 (×3): 500 mg via ORAL
  Filled 2015-10-30 (×3): qty 1

## 2015-10-30 MED ORDER — VANCOMYCIN HCL IN DEXTROSE 1-5 GM/200ML-% IV SOLN
1000.0000 mg | Freq: Two times a day (BID) | INTRAVENOUS | Status: AC
  Administered 2015-10-31: 1000 mg via INTRAVENOUS
  Filled 2015-10-30: qty 200

## 2015-10-30 MED ORDER — MENTHOL 3 MG MT LOZG
1.0000 | LOZENGE | OROMUCOSAL | Status: DC | PRN
Start: 2015-10-30 — End: 2015-11-02

## 2015-10-30 MED ORDER — SODIUM CHLORIDE 0.9 % IV SOLN
INTRAVENOUS | Status: DC | PRN
  Administered 2015-10-30: 19:00:00 via INTRAVENOUS

## 2015-10-30 MED ORDER — MEPERIDINE HCL 50 MG/ML IJ SOLN: 6.2500 mg | INTRAMUSCULAR | Status: DC | PRN

## 2015-10-30 MED ORDER — DEXAMETHASONE SODIUM PHOSPHATE 10 MG/ML IJ SOLN
10.0000 mg | Freq: Once | INTRAMUSCULAR | Status: AC
  Administered 2015-10-30: 10 mg via INTRAVENOUS

## 2015-10-30 MED ORDER — FUROSEMIDE 20 MG PO TABS
20.0000 mg | ORAL_TABLET | Freq: Every day | ORAL | Status: DC
  Administered 2015-11-02: 20 mg via ORAL
  Filled 2015-10-30 (×2): qty 1

## 2015-10-30 MED ORDER — BUPIVACAINE HCL (PF) 0.25 % IJ SOLN
INTRAMUSCULAR | Status: AC
  Filled 2015-10-30: qty 30

## 2015-10-30 MED ORDER — ALBUMIN HUMAN 25 % IV SOLN
INTRAVENOUS | Status: DC | PRN
  Administered 2015-10-30: 18:00:00 via INTRAVENOUS

## 2015-10-30 MED ORDER — PHENOL 1.4 % MT LIQD
1.0000 | OROMUCOSAL | Status: DC | PRN
  Filled 2015-10-30: qty 177

## 2015-10-30 MED ORDER — ACETAMINOPHEN 325 MG PO TABS: 650.0000 mg | ORAL_TABLET | Freq: Four times a day (QID) | ORAL | Status: DC | PRN

## 2015-10-30 MED ORDER — ALBUMIN HUMAN 5 % IV SOLN
INTRAVENOUS | Status: AC
  Filled 2015-10-30: qty 250

## 2015-10-30 MED ORDER — ACETAMINOPHEN 10 MG/ML IV SOLN
INTRAVENOUS | Status: AC
  Filled 2015-10-30: qty 100

## 2015-10-30 MED ORDER — DIPHENHYDRAMINE HCL 12.5 MG/5ML PO ELIX: 12.5000 mg | ORAL_SOLUTION | ORAL | Status: DC | PRN

## 2015-10-30 MED ORDER — ACETAMINOPHEN 500 MG PO TABS
1000.0000 mg | ORAL_TABLET | Freq: Four times a day (QID) | ORAL | Status: AC
  Administered 2015-10-31 (×4): 1000 mg via ORAL
  Filled 2015-10-30 (×4): qty 2

## 2015-10-30 MED ORDER — PHENYLEPHRINE HCL 10 MG/ML IJ SOLN
10.0000 mg | INTRAMUSCULAR | Status: DC | PRN
  Administered 2015-10-30: 40 ug/min via INTRAVENOUS

## 2015-10-30 MED ORDER — DULOXETINE HCL 20 MG PO CPEP
20.0000 mg | ORAL_CAPSULE | Freq: Every day | ORAL | Status: DC
  Administered 2015-10-31 – 2015-11-02 (×3): 20 mg via ORAL
  Filled 2015-10-30 (×3): qty 1

## 2015-10-30 MED ORDER — ONDANSETRON HCL 4 MG/2ML IJ SOLN
INTRAMUSCULAR | Status: DC | PRN
  Administered 2015-10-30: 4 mg via INTRAVENOUS

## 2015-10-30 MED ORDER — ONDANSETRON HCL 4 MG/2ML IJ SOLN: 4.0000 mg | Freq: Four times a day (QID) | INTRAMUSCULAR | Status: DC | PRN

## 2015-10-30 SURGICAL SUPPLY — 70 items
BAG SPEC THK2 15X12 ZIP CLS (MISCELLANEOUS) ×3
BAG ZIPLOCK 12X15 (MISCELLANEOUS) ×9 IMPLANT
BALL HIP ARTICU 28 +5 (Hips) IMPLANT
BIT DRILL 2.8X128 (BIT) ×2 IMPLANT
BIT DRILL 2.8X128MM (BIT) ×1
BLADE EXTENDED COATED 6.5IN (ELECTRODE) ×3 IMPLANT
BLADE SAW SAG 73X25 THK (BLADE) ×2
BLADE SAW SGTL 73X25 THK (BLADE) ×1 IMPLANT
CUBES CANC 30CC PCANCUBE30 (Bone Implant) ×3 IMPLANT
CUP PINNACLE SZ 56MM (Hips) ×2 IMPLANT
DRAPE INCISE IOBAN 66X45 STRL (DRAPES) ×3 IMPLANT
DRAPE LG THREE QUARTER DISP (DRAPES) ×6 IMPLANT
DRAPE ORTHO SPLIT 77X108 STRL (DRAPES) ×6
DRAPE POUCH INSTRU U-SHP 10X18 (DRAPES) ×5 IMPLANT
DRAPE SURG ORHT 6 SPLT 77X108 (DRAPES) ×2 IMPLANT
DRAPE U-SHAPE 47X51 STRL (DRAPES) ×3 IMPLANT
DRSG EMULSION OIL 3X16 NADH (GAUZE/BANDAGES/DRESSINGS) ×3 IMPLANT
DRSG MEPILEX BORDER 4X4 (GAUZE/BANDAGES/DRESSINGS) ×6 IMPLANT
DRSG MEPILEX BORDER 4X8 (GAUZE/BANDAGES/DRESSINGS) ×3 IMPLANT
DURAPREP 26ML APPLICATOR (WOUND CARE) ×3 IMPLANT
ELECT REM PT RETURN 9FT ADLT (ELECTROSURGICAL) ×3
ELECTRODE REM PT RTRN 9FT ADLT (ELECTROSURGICAL) ×1 IMPLANT
EVACUATOR 1/8 PVC DRAIN (DRAIN) ×3 IMPLANT
FACESHIELD WRAPAROUND (MASK) ×12 IMPLANT
FACESHIELD WRAPAROUND OR TEAM (MASK) ×4 IMPLANT
GAUZE SPONGE 4X4 12PLY STRL (GAUZE/BANDAGES/DRESSINGS) ×3 IMPLANT
GLOVE BIO SURGEON STRL SZ7.5 (GLOVE) ×3 IMPLANT
GLOVE BIO SURGEON STRL SZ8 (GLOVE) ×3 IMPLANT
GLOVE BIOGEL PI IND STRL 8 (GLOVE) ×3 IMPLANT
GLOVE BIOGEL PI INDICATOR 8 (GLOVE) ×6
GOWN STRL REUS W/TWL LRG LVL3 (GOWN DISPOSABLE) ×3 IMPLANT
GOWN STRL REUS W/TWL XL LVL3 (GOWN DISPOSABLE) ×3 IMPLANT
GRAFT BNE CANC CUBE 1X1X1 30CC (Bone Implant) IMPLANT
HIP BALL ARTICU 28 +5 (Hips) ×3 IMPLANT
IMMOBILIZER KNEE 20 (SOFTGOODS)
IMMOBILIZER KNEE 20 THIGH 36 (SOFTGOODS) IMPLANT
KIT BASIN OR (CUSTOM PROCEDURE TRAY) ×3 IMPLANT
LINER ACETABULAR 28MM +4 ESCON (Hips) ×2 IMPLANT
MANIFOLD NEPTUNE II (INSTRUMENTS) ×3 IMPLANT
NDL SAFETY ECLIPSE 18X1.5 (NEEDLE) IMPLANT
NEEDLE HYPO 18GX1.5 SHARP (NEEDLE) ×3
NS IRRIG 1000ML POUR BTL (IV SOLUTION) ×3 IMPLANT
PACK TOTAL JOINT (CUSTOM PROCEDURE TRAY) ×3 IMPLANT
PADDING CAST COTTON 6X4 STRL (CAST SUPPLIES) ×6 IMPLANT
PASSER SUT SWANSON 36MM LOOP (INSTRUMENTS) ×3 IMPLANT
POSITIONER SURGICAL ARM (MISCELLANEOUS) ×3 IMPLANT
SCREW 6.5MMX25MM (Screw) ×2 IMPLANT
SCREW 6.5MMX35MM (Screw) ×2 IMPLANT
SCREW BONE 5.0X30 (Screw) ×2 IMPLANT
SCREW PINN CAN BONE 6.5MMX15MM (Screw) ×2 IMPLANT
SCREW TPR HD 5.0MM DIA 50 (Screw) ×4 IMPLANT
SPONGE LAP 18X18 X RAY DECT (DISPOSABLE) ×3 IMPLANT
STAPLER VISISTAT 35W (STAPLE) ×3 IMPLANT
SUCTION FRAZIER HANDLE 10FR (MISCELLANEOUS) ×2
SUCTION FRAZIER HANDLE 12FR (TUBING) ×2
SUCTION TUBE FRAZIER 10FR DISP (MISCELLANEOUS) ×1 IMPLANT
SUCTION TUBE FRAZIER 12FR DISP (TUBING) IMPLANT
SUT ETHIBOND NAB CT1 #1 30IN (SUTURE) ×6 IMPLANT
SUT MNCRL AB 4-0 PS2 18 (SUTURE) ×2 IMPLANT
SUT VIC AB 1 CT1 27 (SUTURE) ×9
SUT VIC AB 1 CT1 27XBRD ANTBC (SUTURE) ×3 IMPLANT
SUT VIC AB 2-0 CT1 27 (SUTURE) ×9
SUT VIC AB 2-0 CT1 TAPERPNT 27 (SUTURE) ×3 IMPLANT
SUT VLOC 180 0 24IN GS25 (SUTURE) ×3 IMPLANT
SWAB COLLECTION DEVICE MRSA (MISCELLANEOUS) ×3 IMPLANT
SWAB CULTURE ESWAB REG 1ML (MISCELLANEOUS) IMPLANT
SYR 50ML LL SCALE MARK (SYRINGE) IMPLANT
TOWEL OR 17X26 10 PK STRL BLUE (TOWEL DISPOSABLE) ×6 IMPLANT
TRAY FOLEY W/METER SILVER 14FR (SET/KITS/TRAYS/PACK) ×3 IMPLANT
WATER STERILE IRR 1500ML POUR (IV SOLUTION) ×3 IMPLANT

## 2015-10-30 NOTE — Transfer of Care (Signed)
Immediate Anesthesia Transfer of Care Note  Patient: Misty Barker  Procedure(s) Performed: Procedure(s): RIGHT HIP ACETABULAR REVISION /CONSTRAINED LINER (POSTERIOR APPROACH)  (Right)  Patient Location: PACU  Anesthesia Type:Spinal  Level of Consciousness:  sedated, patient cooperative and responds to stimulation  Airway & Oxygen Therapy:Patient Spontanous Breathing and Patient connected to face mask oxgen  Post-op Assessment:  Report given to PACU RN and Post -op Vital signs reviewed and stable  Post vital signs:  Reviewed and stable  Last Vitals:  Filed Vitals:   10/30/15 1443  BP: 133/52  Pulse: 74  Temp: 36.7 C  Resp: 16    Complications: No apparent anesthesia complications

## 2015-10-30 NOTE — Anesthesia Preprocedure Evaluation (Addendum)
Anesthesia Evaluation  Patient identified by MRN, date of birth, ID band Patient awake    Reviewed: Allergy & Precautions, NPO status , Patient's Chart, lab work & pertinent test results  History of Anesthesia Complications (+) PONV and history of anesthetic complications  Airway Mallampati: II  TM Distance: >3 FB Neck ROM: Full    Dental  (+) Dental Advisory Given   Pulmonary neg pulmonary ROS,    breath sounds clear to auscultation       Cardiovascular negative cardio ROS   Rhythm:Regular Rate:Normal     Neuro/Psych  Headaches, Depression    GI/Hepatic negative GI ROS, Neg liver ROS,   Endo/Other  Hypothyroidism   Renal/GU negative Renal ROS     Musculoskeletal  (+) Arthritis , Osteoarthritis,  scoliosis   Abdominal   Peds  Hematology negative hematology ROS (+)   Anesthesia Other Findings   Reproductive/Obstetrics                            Anesthesia Physical Anesthesia Plan  ASA: II  Anesthesia Plan: Spinal   Post-op Pain Management:    Induction: Intravenous  Airway Management Planned: Natural Airway  Additional Equipment:   Intra-op Plan:   Post-operative Plan:   Informed Consent: I have reviewed the patients History and Physical, chart, labs and discussed the procedure including the risks, benefits and alternatives for the proposed anesthesia with the patient or authorized representative who has indicated his/her understanding and acceptance.   Dental advisory given  Plan Discussed with: CRNA and Surgeon  Anesthesia Plan Comments: (Plan routine monitors, SAB)        Anesthesia Quick Evaluation

## 2015-10-30 NOTE — Progress Notes (Signed)
Called about post op xray report from PACU because of abnormality noted. I  reviewed the xray and will ask them to keep her at bedrest through the night until Dr. Wynelle Link reviews in the am but see nothing of emergent concern.  Olivia Mackie [facility]

## 2015-10-30 NOTE — Anesthesia Postprocedure Evaluation (Signed)
Anesthesia Post Note  Patient: Misty Barker  Procedure(s) Performed: Procedure(s) (LRB): RIGHT HIP ACETABULAR REVISION /CONSTRAINED LINER (POSTERIOR APPROACH)  (Right)  Patient location during evaluation: PACU Anesthesia Type: Spinal Level of consciousness: awake and alert, oriented and patient cooperative Pain management: pain level controlled Vital Signs Assessment: post-procedure vital signs reviewed and stable Respiratory status: spontaneous breathing, nonlabored ventilation, respiratory function stable and patient connected to nasal cannula oxygen Cardiovascular status: blood pressure returned to baseline and stable Postop Assessment: spinal receding, patient able to bend at knees and no signs of nausea or vomiting Anesthetic complications: no    Last Vitals:  Filed Vitals:   10/30/15 2115 10/30/15 2130  BP: 113/55 111/56  Pulse: 66 66  Temp: 36.4 C   Resp: 18 12    Last Pain:  Filed Vitals:   10/30/15 2139  PainSc: 4     LLE Motor Response: Purposeful movement (10/30/15 2130) LLE Sensation: Decreased (10/30/15 2130) RLE Motor Response: Purposeful movement (10/30/15 2130) RLE Sensation: Decreased (10/30/15 2130) L Sensory Level: S1-Sole of foot, small toes (10/30/15 2130) R Sensory Level: S1-Sole of foot, small toes (10/30/15 2130)  Naje Rice,E. Chrisy Hillebrand

## 2015-10-30 NOTE — Anesthesia Procedure Notes (Signed)
Spinal Patient location during procedure: OR End time: 10/30/2015 4:45 PM Staffing Anesthesiologist: Annye Asa Performed by: anesthesiologist  Preanesthetic Checklist Completed: patient identified, site marked, surgical consent, pre-op evaluation, timeout performed, IV checked, risks and benefits discussed and monitors and equipment checked Spinal Block Patient position: sitting Prep: Betadine, ChloraPrep and site prepped and draped Patient monitoring: blood pressure, continuous pulse ox and heart rate Approach: midline Location: L3-4 Injection technique: single-shot Needle Needle type: Quincke  Needle gauge: 25 G Needle length: 9 cm Additional Notes Pt identified in Operating room.  Monitors applied. Working IV access confirmed. Sterile prep, drape lumbar spine.  1% lido local L 3,4.  #25ga Quincke into clear CSF L 3,4.  13.5 mg 0.75% Bupivacaine with dextrose injected with asp CSF beginning and end of injection.  Patient asymptomatic, VSS, no heme aspirated, tolerated well.  Jenita Seashore, MD

## 2015-10-30 NOTE — Brief Op Note (Signed)
10/30/2015  7:40 PM  PATIENT:  Misty Barker  70 y.o. female  PRE-OPERATIVE DIAGNOSIS:  RIGHT HIP INSTABILITY   POST-OPERATIVE DIAGNOSIS:  RIGHT HIP INSTABILITY   PROCEDURE:  Procedure(s): RIGHT HIP ACETABULAR REVISION /CONSTRAINED LINER (POSTERIOR APPROACH)  (Right)  SURGEON:  Surgeon(s) and Role:    * Gaynelle Arabian, MD - Primary  PHYSICIAN ASSISTANT:   ASSISTANTS: Ardeen Jourdain, PA-C   ANESTHESIA:   spinal  EBL:  Total I/O In: 1000 [I.V.:1000] Out: 400 [Blood:400]  DRAINS: (Medium) Hemovact drain(s) in the right hip with  Suction Open   LOCAL MEDICATIONS USED:  MARCAINE     COUNTS:  YES  TOURNIQUET:  * No tourniquets in log *  DICTATION: .Other Dictation: Dictation Number [id number]  PLAN OF CARE: Admit to inpatient   PATIENT DISPOSITION:  PACU - hemodynamically stable.

## 2015-10-31 LAB — CBC
HCT: 25 % — ABNORMAL LOW (ref 36.0–46.0)
Hemoglobin: 8.3 g/dL — ABNORMAL LOW (ref 12.0–15.0)
MCH: 25.5 pg — ABNORMAL LOW (ref 26.0–34.0)
MCHC: 33.2 g/dL (ref 30.0–36.0)
MCV: 76.9 fL — ABNORMAL LOW (ref 78.0–100.0)
Platelets: 257 10*3/uL (ref 150–400)
RBC: 3.25 MIL/uL — ABNORMAL LOW (ref 3.87–5.11)
RDW: 16.5 % — AB (ref 11.5–15.5)
WBC: 16.4 10*3/uL — ABNORMAL HIGH (ref 4.0–10.5)

## 2015-10-31 LAB — BASIC METABOLIC PANEL
Anion gap: 3 — ABNORMAL LOW (ref 5–15)
BUN: 11 mg/dL (ref 6–20)
CALCIUM: 8.6 mg/dL — AB (ref 8.9–10.3)
CO2: 29 mmol/L (ref 22–32)
CREATININE: 0.56 mg/dL (ref 0.44–1.00)
Chloride: 107 mmol/L (ref 101–111)
GFR calc non Af Amer: >60 mL/min (ref >60)
Glucose, Bld: 133 mg/dL — ABNORMAL HIGH (ref 65–99)
Potassium: 4.1 mmol/L (ref 3.5–5.1)
SODIUM: 139 mmol/L (ref 135–145)

## 2015-10-31 LAB — POCT I-STAT 4, (NA,K, GLUC, HGB,HCT)
Glucose, Bld: 94 mg/dL (ref 65–99)
HCT: 26 % — ABNORMAL LOW (ref 36.0–46.0)
HEMOGLOBIN: 8.8 g/dL — AB (ref 12.0–15.0)
POTASSIUM: 4.2 mmol/L (ref 3.5–5.1)
SODIUM: 141 mmol/L (ref 135–145)

## 2015-10-31 MED ORDER — HYDROMORPHONE HCL 1 MG/ML IJ SOLN
0.5000 mg | INTRAMUSCULAR | Status: DC | PRN
  Administered 2015-10-31 (×2): 1 mg via INTRAVENOUS
  Filled 2015-10-31 (×2): qty 1

## 2015-10-31 MED ORDER — POLYSACCHARIDE IRON COMPLEX 150 MG PO CAPS
150.0000 mg | ORAL_CAPSULE | Freq: Two times a day (BID) | ORAL | Status: DC
  Administered 2015-10-31 – 2015-11-02 (×5): 150 mg via ORAL
  Filled 2015-10-31 (×5): qty 1

## 2015-10-31 MED ORDER — SODIUM CHLORIDE 0.9 % IV BOLUS (SEPSIS)
250.0000 mL | Freq: Once | INTRAVENOUS | Status: AC
  Administered 2015-10-31: 250 mL via INTRAVENOUS

## 2015-10-31 NOTE — Care Management Note (Signed)
Case Management Note  Patient Details  Name: Misty Barker MRN: [id number] Date of Birth: [birth year]  Subjective/Objective:                  RIGHT HIP ACETABULAR REVISION Lenon Ahmadi LINER (POSTERIOR APPROACH) (Right) Action/Plan: Discharge planning Expected Discharge Date:  11/02/15               Expected Discharge Plan:  [place]  In-House Referral:     Discharge planning Services  CM Consult  Post Acute Care Choice:    Choice offered to:  Patient  DME Arranged:  N/A DME Agency:  NA  HH Arranged:  PT [place] Agency:  [facility] (now [facility])  Status of Service:  Completed, signed off  If discussed at [employer] of Stay Meetings, dates discussed:    Additional Comments: CM met with pt in room to offer choice of home health agency.  Pt chooses [facility] [place].  Referral given to [employer], Tim.  Pt has DME from previous surgery and denies need for additional DME.  No other CM need were communicated. Dellie Catholic, RN 10/31/2015, 1:31 PM

## 2015-10-31 NOTE — Progress Notes (Signed)
Physical Therapy Treatment Patient Details Name: Misty Barker MRN: [id number] DOB: 1945-06-07 Today's Date: 10/31/2015    History of Present Illness Pt was admitted for instability of  R prosthetic hip.  She is s/p revision of acetabulum and constraint liner.    PT Comments    Marked improvement in activity tolerance vs am session.  Follow Up Recommendations  Home health PT;SNF     Equipment Recommendations  None recommended by PT    Recommendations for Other Services OT consult     Precautions / Restrictions Precautions Precautions: Posterior Hip Restrictions Weight Bearing Restrictions: Yes RLE Weight Bearing: Partial weight bearing RLE Partial Weight Bearing Percentage or Pounds: 25-50%    Mobility  Bed Mobility Overal bed mobility: Needs Assistance Bed Mobility: Supine to Sit;Sit to Supine     Supine to sit: Min assist;Mod assist;+2 for physical assistance;+2 for safety/equipment Sit to supine: Min assist;Mod assist;+2 for physical assistance;+2 for safety/equipment   General bed mobility comments: cues for sequence and assistance for legs and trunk  Transfers Overall transfer level: Needs assistance Equipment used: Rolling walker (2 wheeled) Transfers: Sit to/from Stand Sit to Stand: Min assist;Mod assist;+2 physical assistance;+2 safety/equipment         General transfer comment: assist to rise and stabilize.  Cues for LE management and use of UEs to self assist  Ambulation/Gait Ambulation/Gait assistance: Min assist;Mod assist;+2 physical assistance;+2 safety/equipment Ambulation Distance (Feet): 11 Feet Assistive device: Rolling walker (2 wheeled) Gait Pattern/deviations: Step-to pattern;Decreased step length - right;Decreased step length - left;Shuffle;Trunk flexed;Narrow base of support Gait velocity: decr Gait velocity interpretation: Below normal speed for age/gender General Gait Details: cues for sequence, posture and position from  RW   Stairs            Wheelchair Mobility    Modified Rankin (Stroke Patients Only)       Balance                                    Cognition Arousal/Alertness: Awake/alert Behavior During Therapy: WFL for tasks assessed/performed Overall Cognitive Status: Within Functional Limits for tasks assessed                      Exercises      General Comments        Pertinent Vitals/Pain Pain Assessment: 0-10 Pain Score: 6  Pain Location: R hip/groin Pain Descriptors / Indicators: Aching Pain Intervention(s): Limited activity within patient's tolerance;Monitored during session;Premedicated before session;Ice applied    Home Living                      Prior Function            PT Goals (current goals can now be found in the care plan section) Acute Rehab PT Goals Patient Stated Goal: healed and no further dislocation; get back to being active PT Goal Formulation: With patient Time For Goal Achievement: 11/05/15 Potential to Achieve Goals: Good Progress towards PT goals: Progressing toward goals    Frequency  7X/week    PT Plan Current plan remains appropriate    Co-evaluation             End of Session Equipment Utilized During Treatment: Gait belt Activity Tolerance: Patient limited by fatigue;Patient limited by pain Patient left: in bed;with call bell/phone within reach;with family/visitor present     Time: [id number] PT Time  Calculation (min) (ACUTE ONLY): 21 min  Charges:  $Gait Training: 8-22 mins                    G Codes:      Egan Berkheimer 11-09-15, 4:30 PM

## 2015-10-31 NOTE — Evaluation (Signed)
Occupational Therapy Evaluation Patient Details Name: Misty Barker MRN: [id number] DOB: 05/08/1945 Today's Date: 10/31/2015    History of Present Illness Pt was admitted for instability of  R prosthetic hip.  She is s/p revision of acetabulum and constraint liner.   Clinical Impression   This 70 year old female was admitted for the above sx.  She was limited by pain during initial evaluation and will benefit from continued OT. Goals in acute are for min A    Follow Up Recommendations  Supervision/Assistance - 24 hour;SNF (depending upon progress)    Equipment Recommendations  None recommended by OT    Recommendations for Other Services       Precautions / Restrictions Precautions Precautions: Posterior Hip Restrictions Weight Bearing Restrictions: Yes RLE Weight Bearing: Partial weight bearing (25-50%) RLE Partial Weight Bearing Percentage or Pounds:  (25-50)      Mobility Bed Mobility Overal bed mobility: Needs Assistance Bed Mobility: Supine to Sit;Sit to Sidelying     Supine to sit: Mod assist;+2 for physical assistance   Sit to sidelying: Mod assist;+2 for safety/equipment General bed mobility comments: cues for sequence and assistance for legs and trunk  Transfers Overall transfer level: Needs assistance Equipment used: Rolling walker (2 wheeled) Transfers: Sit to/from Stand Sit to Stand: Mod assist;+2 physical assistance         General transfer comment: assist to rise and stabilize.  Pt is PWB but not putting a lot of weight on leg    Balance                                            ADL Overall ADL's : Needs assistance/impaired     Grooming: Set up;Sitting   Upper Body Bathing: Set up;Sitting   Lower Body Bathing: Maximal assistance;Sit to/from stand;+2 for physical assistance;With adaptive equipment   Upper Body Dressing : Minimal assistance;Sitting   Lower Body Dressing: Maximal assistance;+2 for physical  assistance;Sit to/from stand                 General ADL Comments: Pt was able to stand with +2 assistance.  Pt has AE. Reviewed posterior THPs.  Pt initially dizzy BP 97/61     Vision     Perception     Praxis      Pertinent Vitals/Pain Pain Assessment: Faces Faces Pain Scale: Hurts whole lot Pain Location: R hip Pain Descriptors / Indicators: Aching Pain Intervention(s): Limited activity within patient's tolerance;Monitored during session;Premedicated before session;Repositioned;Ice applied     Hand Dominance Right   Extremity/Trunk Assessment Upper Extremity Assessment Upper Extremity Assessment: Generalized weakness           Communication Communication Communication: No difficulties   Cognition Arousal/Alertness: Awake/alert Behavior During Therapy: WFL for tasks assessed/performed Overall Cognitive Status: Within Functional Limits for tasks assessed                     General Comments       Exercises       Shoulder Instructions      Home Living Family/patient expects to be discharged to:: Private residence Living Arrangements: Spouse/significant other Available Help at Discharge: Family;Available PRN/intermittently Type of Home: House Home Access: Stairs to enter [employer] of Steps: 1   Home Layout: One level     Bathroom Shower/Tub: Occupational psychologist: Standard Bathroom Accessibility:  Yes How Accessible: Accessible via walker Home Equipment: [place] - 2 wheels;Shower seat - built in;Bedside commode   Additional Comments: AE kit      Prior Functioning/Environment Level of Independence: Independent;Independent with assistive device(s)             OT Diagnosis: Acute pain   OT Problem List: Decreased strength;Decreased activity tolerance;Decreased knowledge of precautions;Pain   OT Treatment/Interventions: Self-care/ADL training;DME and/or AE instruction;Patient/family education    OT  Goals(Current goals can be found in the care plan section) Acute Rehab OT Goals Patient Stated Goal: healed and no further dislocation; get back to being active OT Goal Formulation: With patient Time For Goal Achievement: 11/07/15 Potential to Achieve Goals: Good ADL Goals Pt Will Perform Lower Body Bathing: with min assist;with adaptive equipment;sit to/from stand Pt Will Perform Lower Body Dressing: with min assist;with adaptive equipment;sit to/from stand Pt Will Transfer to Toilet: with min assist;ambulating;bedside commode Pt Will Perform Toileting - Clothing Manipulation and hygiene: with min assist;sit to/from stand Additional ADL Goal #1: pt will follow posterior thps without cues  OT Frequency: Min 2X/week   Barriers to D/C:            Co-evaluation PT/OT/SLP Co-Evaluation/Treatment: Yes Reason for Co-Treatment: For patient/therapist safety PT goals addressed during session: Mobility/safety with mobility OT goals addressed during session: ADL's and self-care      End of Session    Activity Tolerance: Patient limited by pain Patient left: in bed;with call bell/phone within reach;with bed alarm set   Time: 6381-7711 OT Time Calculation (min): 37 min Charges:  OT General Charges $OT Visit: 1 Procedure OT Evaluation $OT Eval Low Complexity: 1 Procedure G-Codes:    Yakelin Grenier Nov 02, 2015, 12:32 PM  Lesle Chris, OTR/L [phone] 02-Nov-2015

## 2015-10-31 NOTE — Op Note (Signed)
NAME:  Misty Barker, Misty Barker NO.:  [id number]  MEDICAL RECORD NO.:  [id number]  LOCATION:  [id number]                         FACILITY:  [facility]  PHYSICIAN:  Gaynelle Arabian, M.D.    DATE OF BIRTH:  06/05/1945  DATE OF PROCEDURE:  10/30/2015 DATE OF DISCHARGE:                              OPERATIVE REPORT   PREOPERATIVE DIAGNOSIS:  Right hip instability.  POSTOPERATIVE DIAGNOSIS:  Right hip instability.  PROCEDURE:  Right hip acetabular revision with constrained liner.  SURGEON:  Gaynelle Arabian, M.D.  ASSISTANT:  Ardeen Jourdain, PA-C.  ANESTHESIA:  Spinal.  ESTIMATED BLOOD LOSS:  1100.  DRAIN:  Hemovac x1.  COMPLICATIONS:  None.  CONDITION:  Stable to recovery.  BRIEF CLINICAL NOTE:  Ms. 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  She presents now for constrained liner versus component revision with constrained liner.  PROCEDURE IN DETAIL:  After successful administration of spinal anesthetic, the patient was placed in the left lateral decubitus position with the right side up and held with the hip positioner.  Left lower extremity was isolated from her perineum with plastic drapes, and prepped and draped in the usual sterile fashion.  Short posterolateral incision was made with a 10-blade through the subcutaneous tissue to the fascia lata, which was incised in line with the skin incision.  Sciatic nerve was palpated and protected.  Short rotators and capsule were isolated off the femur and then the hip was dislocated.  Femoral head was removed.  Femoral component was in good position.  It has had approximately 20 degrees of anteversion.  We then retracted the femur anteriorly to assess the acetabular component.  I removed the acetabular liner.  The component was anteverted about 30 degrees.  So, I felt that it was not ideal position.  I decided to revise the liner to get in better position rather than just  constraining her and putting excess stress on the ream.  We then placed acetabular retractors and using the [facility] revision osteotomes, I removed the cup with essentially no bone loss.  It was a 48 cup that was removed.  We reamed up to 51 mm with placement of 52-mm Pinnacle acetabular shell, multi-holed.  Placed three dome screws and felt that it had excellent purchase with the cup.  We went to place the constrained liner.  The liner was placed and impacted and locked in position.  We then placed a 32+ 5 head and reduced the hip.  Upon numerous attempts at placing the locking ring, it was noted that the polyethylene was indented from these attempts and would not function appropriately.  We went to remove the polyethylene and upon removing the polyethylene, noted that the cup had shifted.  I then removed the socket and reamed up to 55 mm for placement of a 56-mm Pinnacle acetabular revision shell and place three dome screws, additional three or four peripheral screws and we had excellent purchase.  I threaded the impactor handle back into the cup and the whole pelvis was moving as a solitary unit.  For the 56 shell, we had to place a 50 liner.  The 50 x 28 constrained system was placed.  I trialed a standard  32 and even with that, she had great stability, but there was a small amount of posterior laxity and I felt that, that was not acceptable.  Thus, we placed the constrained 50-mm liner for 28 system.  It was impacted into the Pinnacle acetabular shell and locked in place.  A 28+ 5 head was placed, the hip was reduced and then the locking ring, locked in place with the first attempt.  I was able to extend her fully with about 50 degrees of external rotation, flexed 90, rotated about 30, adducted 30 and anteflexed 70, rotated 30 and adducted about 40.  Wound was then copiously irrigated with saline solution.  The short rotators and capsule were reattached to the femur through drill  holes with Ethibond suture.  The fascia lata was closed over Hemovac drain with a running #1 V-Loc suture.  A 30 mL of 0.25% Marcaine was injected in the subcu tissues.  Subcu was closed with interrupted 2-0 Vicryl and subcuticular was closed using running 4-0 Monocryl.  The drains hooked to suction.  Incision was cleaned and dried, then Steri-Strips and bulky sterile dressing applied.  She was subsequently awakened and transported to recovery in stable condition.  Note that a surgical assistant was a medical necessity for this procedure to do it in a safe manner.  Surgical assistant was necessary for retraction of vital neurovascular structures and proper positioning of the limb for safe and accurate removal of the old implant and safe and accurate placement of new implant.     Gaynelle Arabian, M.D.     FA/MEDQ  D:  10/30/2015  T:  10/31/2015  Job:  [id number]

## 2015-10-31 NOTE — Evaluation (Signed)
Physical Therapy Evaluation Patient Details Name: Misty Barker MRN: [id number] DOB: 07-Dec-1945 Today's Date: 10/31/2015   History of Present Illness  Pt was admitted for instability of  R prosthetic hip.  She is s/p revision of acetabulum and constraint liner.  Clinical Impression  Pt s/p R THR revision presents with decreased R LE strength/ROM, PWB and post op pain limiting functional mobility.  Pt hopes to progress to dc home with family assist but may need to consider follow up rehab at SNF level dependent on acute stay progress.    Follow Up Recommendations Home health PT;SNF (dependent on acute stay progress.)    Equipment Recommendations  None recommended by PT    Recommendations for Other Services OT consult     Precautions / Restrictions Precautions Precautions: Posterior Hip Precaution Booklet Issued: Yes (comment) Precaution Comments: Precautions reviewed Restrictions Weight Bearing Restrictions: Yes RLE Weight Bearing: Partial weight bearing RLE Partial Weight Bearing Percentage or Pounds: 25-50%      Mobility  Bed Mobility Overal bed mobility: Needs Assistance Bed Mobility: Supine to Sit;Sit to Supine     Supine to sit: Mod assist;+2 for physical assistance Sit to supine: Mod assist;+2 for physical assistance Sit to sidelying: Mod assist;+2 for safety/equipment General bed mobility comments: cues for sequence and assistance for legs and trunk  Transfers Overall transfer level: Needs assistance Equipment used: Rolling walker (2 wheeled) Transfers: Sit to/from Stand Sit to Stand: Mod assist;+2 physical assistance         General transfer comment: assist to rise and stabilize.  Pt is PWB but not putting a lot of weight on leg  Ambulation/Gait             General Gait Details: NT 2* pt fatigue and dizziness with increased time in standing BP 97/61  Stairs            Wheelchair Mobility    Modified Rankin (Stroke Patients  Only)       Balance Overall balance assessment: Needs assistance Sitting-balance support: Bilateral upper extremity supported;Feet supported Sitting balance-Leahy Scale: Fair     Standing balance support: Bilateral upper extremity supported Standing balance-Leahy Scale: Poor                               Pertinent Vitals/Pain Pain Assessment: Faces Faces Pain Scale: Hurts whole lot Pain Location: R hip Pain Descriptors / Indicators: Aching;Sore Pain Intervention(s): Limited activity within patient's tolerance;Monitored during session;Premedicated before session;Ice applied    Home Living Family/patient expects to be discharged to:: Private residence Living Arrangements: Spouse/significant other Available Help at Discharge: Family;Available PRN/intermittently Type of Home: House Home Access: Stairs to enter   Entrance Stairs-Number of Steps: 1 Home Layout: One level Home Equipment: Walker - 2 wheels;Shower seat - built in;Bedside commode Additional Comments: AE kit    Prior Function Level of Independence: Independent;Independent with assistive device(s)               Hand Dominance   Dominant Hand: Right    Extremity/Trunk Assessment   Upper Extremity Assessment: Generalized weakness           Lower Extremity Assessment: Generalized weakness;RLE deficits/detail RLE Deficits / Details: Strength at hip 2-/5 with AAROM at hip to 50 flex and 10 abd    Cervical / Trunk Assessment: Normal  Communication   Communication: No difficulties  Cognition Arousal/Alertness: Awake/alert Behavior During Therapy: WFL for tasks assessed/performed Overall Cognitive Status: Within  Functional Limits for tasks assessed                      General Comments      Exercises Total Joint Exercises Ankle Circles/Pumps: AROM;Both;15 reps;Supine Heel Slides: AAROM;Right;Supine;10 reps Hip ABduction/ADduction: AAROM;Right;5 reps;Supine       Assessment/Plan    PT Assessment Patient needs continued PT services  PT Diagnosis Difficulty walking   PT Problem List Decreased strength;Decreased range of motion;Decreased activity tolerance;Decreased mobility;Pain  PT Treatment Interventions DME instruction;Gait training;Stair training;Functional mobility training;Therapeutic activities;Therapeutic exercise;Patient/family education   PT Goals (Current goals can be found in the Care Plan section) Acute Rehab PT Goals Patient Stated Goal: healed and no further dislocation; get back to being active PT Goal Formulation: With patient Time For Goal Achievement: 11/05/15 Potential to Achieve Goals: Good    Frequency 7X/week   Barriers to discharge        Co-evaluation PT/OT/SLP Co-Evaluation/Treatment: Yes Reason for Co-Treatment: For patient/therapist safety PT goals addressed during session: Mobility/safety with mobility OT goals addressed during session: ADL's and self-care       End of Session Equipment Utilized During Treatment: Gait belt Activity Tolerance: Patient limited by pain Patient left: in bed;with call bell/phone within reach Nurse Communication: Mobility status         Time: 9733-1250 PT Time Calculation (min) (ACUTE ONLY): 40 min   Charges:   PT Evaluation $PT Eval Low Complexity: 1 Procedure PT Treatments $Therapeutic Activity: 8-22 mins   PT G Codes:        Misty Barker Nov 09, 2015, 12:52 PM

## 2015-10-31 NOTE — Progress Notes (Signed)
Subjective: 1 Day Post-Op Procedure(s) (LRB): RIGHT HIP ACETABULAR REVISION /CONSTRAINED LINER (POSTERIOR APPROACH)  (Right) Patient reports pain as mild and moderate.   Patient seen in rounds by Dr. Wynelle Link. Patient is well, but has had some minor complaints of pain in the hip, requiring pain medications We will start therapy today.  He was placed PWB 25-50% to the right leg. Will see how he does today with therapy.  Objective: Vital signs in last 24 hours: Temp:  [97.4 F (36.3 C)-98 F (36.7 C)] 97.4 F (36.3 C) (07/20 0528) Pulse Rate:  [65-74] 69 (07/20 0528) Resp:  [10-18] 16 (07/20 0528) BP: (100-133)/(37-67) 100/37 mmHg (07/20 0528) SpO2:  [100 %] 100 % (07/20 0528) Weight:  [44.453 kg (98 lb)-52.4 kg (115 lb 8.3 oz)] 52.4 kg (115 lb 8.3 oz) (07/19 2238)  Intake/Output from previous day:  Intake/Output Summary (Last 24 hours) at 10/31/15 0739 Last data filed at 10/31/15 0529  Gross per 24 hour  Intake   5085 ml  Output   2155 ml  Net   2930 ml    Intake/Output this shift: UOP 225 - Monitor output - Leave foley in today  Labs:  Recent Labs  10/29/15 0900 10/31/15 0506  HGB 12.2 8.3*    Recent Labs  10/29/15 0900 10/31/15 0506  WBC 8.4 16.4*  RBC 5.24* 3.25*  HCT 38.6 25.0*  PLT 411* 257    Recent Labs  10/29/15 0900 10/31/15 0506  NA 138 139  K 4.0 4.1  CL 101 107  CO2 30 29  BUN 10 11  CREATININE 0.65 0.56  GLUCOSE 95 133*  CALCIUM 9.8 8.6*    Recent Labs  10/29/15 0900  INR 0.95    EXAM General - Patient is Alert, Appropriate and Oriented Extremity - Neurovascular intact Sensation intact distally Dorsiflexion/Plantar flexion intact Dressing - dressing C/D/I Motor Function - intact, moving foot and toes well on exam.  Hemovac drain left in place today.  Past Medical History  Diagnosis Date  . History of chicken pox   . Allergy     "seasonal allergies"  . Depression   . Hyperlipidemia   . Thyroid disease   . Abdominal  pain 02/01/2015  . Scoliosis 02/01/2015  . URI, acute 03/10/2015  . 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 Right hip pain 05/24/2015  . Complication of anesthesia   . PONV (postoperative nausea and vomiting)   . Hypothyroidism     supplement used  . Frequent headaches     no problems now.  . Peripheral neuropathy ([place]) 02/01/2015    right hand"carpal tunnel"  . 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 Arthritis     osteoarthritis-"DDD" hips. fingers, toes.  . Hip dislocation, right (HCC)     Assessment/Plan: 1 Day Post-Op Procedure(s) (LRB): RIGHT HIP ACETABULAR REVISION /CONSTRAINED LINER (POSTERIOR APPROACH)  (Right) Active Problems:   Instability of prosthetic hip (HCC)  Estimated body mass index is 18.65 kg/(m^2) as calculated from the following:   Height as of this encounter: 5\' 6"  (1.676 m).   Weight as of this encounter: 52.4 kg (115 lb 8.3 oz). Advance diet Up with therapy - PWB 25-50% Continue foley due to strict I&O   DVT Prophylaxis - Aspirin 325 mg Partial Weight Bearing 25-50% Right Leg D/C Knee Immobilizer Hemovac still in place Begin Therapy Hip Preacutions  Arlee Muslim, PA-C Orthopaedic Surgery 10/31/2015,  7:39 AM

## 2015-10-31 NOTE — Progress Notes (Signed)
10/31/15 0225 [facility] [place] called reg patient's c/o pain despite administraion of prescribed meds. Order received for dilaudid iv. Will continue to monitor patient

## 2015-11-01 ENCOUNTER — Encounter (HOSPITAL_COMMUNITY): Payer: Self-pay | Admitting: Orthopedic Surgery

## 2015-11-01 LAB — BASIC METABOLIC PANEL
Anion gap: 4 — ABNORMAL LOW (ref 5–15)
BUN: 15 mg/dL (ref 6–20)
CO2: 27 mmol/L (ref 22–32)
CREATININE: 0.77 mg/dL (ref 0.44–1.00)
Calcium: 8.5 mg/dL — ABNORMAL LOW (ref 8.9–10.3)
Chloride: 105 mmol/L (ref 101–111)
GFR calc Af Amer: >60 mL/min (ref >60)
Glucose, Bld: 105 mg/dL — ABNORMAL HIGH (ref 65–99)
POTASSIUM: 3.9 mmol/L (ref 3.5–5.1)
SODIUM: 136 mmol/L (ref 135–145)

## 2015-11-01 LAB — CBC
HCT: 22.3 % — ABNORMAL LOW (ref 36.0–46.0)
Hemoglobin: 7.3 g/dL — ABNORMAL LOW (ref 12.0–15.0)
MCH: 25.3 pg — AB (ref 26.0–34.0)
MCHC: 32.7 g/dL (ref 30.0–36.0)
MCV: 77.4 fL — ABNORMAL LOW (ref 78.0–100.0)
PLATELETS: 267 10*3/uL (ref 150–400)
RBC: 2.88 MIL/uL — AB (ref 3.87–5.11)
RDW: 17.5 % — ABNORMAL HIGH (ref 11.5–15.5)
WBC: 15.7 10*3/uL — ABNORMAL HIGH (ref 4.0–10.5)

## 2015-11-01 LAB — PREPARE RBC (CROSSMATCH)

## 2015-11-01 MED ORDER — METHOCARBAMOL 500 MG PO TABS: 500.0000 mg | ORAL_TABLET | Freq: Four times a day (QID) | ORAL | Status: DC | PRN

## 2015-11-01 MED ORDER — FUROSEMIDE 10 MG/ML IJ SOLN
20.0000 mg | Freq: Once | INTRAMUSCULAR | Status: AC
  Administered 2015-11-01: 20 mg via INTRAVENOUS
  Filled 2015-11-01: qty 2

## 2015-11-01 MED ORDER — ACETAMINOPHEN 325 MG PO TABS
650.0000 mg | ORAL_TABLET | Freq: Once | ORAL | Status: AC
  Administered 2015-11-01: 650 mg via ORAL
  Filled 2015-11-01: qty 2

## 2015-11-01 MED ORDER — SODIUM CHLORIDE 0.9 % IV SOLN
Freq: Once | INTRAVENOUS | Status: AC
  Administered 2015-11-01: 09:00:00 via INTRAVENOUS

## 2015-11-01 MED ORDER — OXYCODONE HCL 5 MG PO TABS: 5.0000 mg | ORAL_TABLET | ORAL | Status: DC | PRN

## 2015-11-01 MED ORDER — POLYSACCHARIDE IRON COMPLEX 150 MG PO CAPS: 150.0000 mg | ORAL_CAPSULE | Freq: Two times a day (BID) | ORAL | Status: DC

## 2015-11-01 NOTE — Progress Notes (Signed)
   Subjective: 2 Days Post-Op Procedure(s) (LRB): RIGHT HIP ACETABULAR REVISION /CONSTRAINED LINER (POSTERIOR APPROACH)  (Right) Patient reports pain as mild.   Patient seen in rounds with Dr. Wynelle Link.  Doing a little better today. HGB down to 7.3.  Discussed blood and will give two units. Patient is well, but has had some minor complaints of pain in the hip, requiring pain medications Plan is to go Home after hospital stay.  Will give blood today and plan home tomorrow following therapy and dressing change.  Objective: Vital signs in last 24 hours: Temp:  [97.9 F (36.6 C)-98.9 F (37.2 C)] 98.8 F (37.1 C) (07/21 0554) Pulse Rate:  [72-81] 78 (07/21 0554) Resp:  [15-16] 16 (07/21 0554) BP: (95-111)/(39-52) 107/49 mmHg (07/21 0554) SpO2:  [98 %-100 %] 98 % (07/21 0554)  Intake/Output from previous day:  Intake/Output Summary (Last 24 hours) at 11/01/15 0733 Last data filed at 11/01/15 0600  Gross per 24 hour  Intake 2061.5 ml  Output   2960 ml  Net -898.5 ml    Labs:  Recent Labs  10/29/15 0900 10/30/15 1905 10/31/15 0506 11/01/15 0439  HGB 12.2 8.8* 8.3* 7.3*    Recent Labs  10/31/15 0506 11/01/15 0439  WBC 16.4* 15.7*  RBC 3.25* 2.88*  HCT 25.0* 22.3*  PLT 257 267    Recent Labs  10/31/15 0506 11/01/15 0439  NA 139 136  K 4.1 3.9  CL 107 105  CO2 29 27  BUN 11 15  CREATININE 0.56 0.77  GLUCOSE 133* 105*  CALCIUM 8.6* 8.5*    Recent Labs  10/29/15 0900  INR 0.95    EXAM General - Patient is Alert, Appropriate and Oriented Extremity - Neurovascular intact Sensation intact distally Dorsiflexion/Plantar flexion intact Dressing/Incision - clean, dry, no drainage Motor Function - intact, moving foot and toes well on exam.   Past Medical History  Diagnosis Date  . History of chicken pox   . Allergy     "seasonal allergies"  . Depression   . Hyperlipidemia   . Thyroid disease   . Abdominal pain 02/01/2015  . Scoliosis 02/01/2015  .  URI, acute 03/10/2015  . 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 Right hip pain 05/24/2015  . Complication of anesthesia   . PONV (postoperative nausea and vomiting)   . Hypothyroidism     supplement used  . Frequent headaches     no problems now.  . Peripheral neuropathy ([place]) 02/01/2015    right hand"carpal tunnel"  . 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 Arthritis     osteoarthritis-"DDD" hips. fingers, toes.  . Hip dislocation, right (HCC)     Assessment/Plan: 2 Days Post-Op Procedure(s) (LRB): RIGHT HIP ACETABULAR REVISION /CONSTRAINED LINER (POSTERIOR APPROACH)  (Right) Active Problems:   Instability of prosthetic hip (HCC)  Estimated body mass index is 18.65 kg/(m^2) as calculated from the following:   Height as of this encounter: 5\' 6"  (1.676 m).   Weight as of this encounter: 52.4 kg (115 lb 8.3 oz). Up with therapy Continue foley due to blood transfusion; will continue until blood transfusion complete and strict I&O Plan for discharge tomorrow Discharge home with home health  DVT Prophylaxis - Aspirin 325 mg Weight Bearing As Tolerated right Leg  Arlee Muslim, PA-C Orthopaedic Surgery 11/01/2015, 7:33 AM

## 2015-11-01 NOTE — Discharge Instructions (Signed)
Dr. Gaynelle Arabian Total Joint Specialist [facility] [address], [place], [place] [address] [phone]   POSTERIOR TOTAL HIP REPLACEMENT POSTOPERATIVE DIRECTIONS  Hip Rehabilitation, Guidelines Following Surgery  The results of a hip operation are greatly improved after range of motion and muscle strengthening exercises. Follow all safety measures which are given to protect your hip. If any of these exercises cause increased pain or swelling in your joint, decrease the amount until you are comfortable again. Then slowly increase the exercises. Call your caregiver if you have problems or questions.   HOME CARE INSTRUCTIONS  Remove items at home which could result in a fall. This includes throw rugs or furniture in walking pathways.   ICE to the affected hip every three hours for 30 minutes at a time and then as needed for pain and swelling.  Continue to use ice on the hip for pain and swelling from surgery. You may notice swelling that will progress down to the foot and ankle.  This is normal after surgery.  Elevate the leg when you are not up walking on it.    Continue to use the breathing machine which will help keep your temperature down.  It is common for your temperature to cycle up and down following surgery, especially at night when you are not up moving around and exerting yourself.  The breathing machine keeps your lungs expanded and your temperature down.  DIET You may resume your previous home diet once your are discharged from the hospital.  DRESSING / WOUND CARE / SHOWERING You may shower 3 days after surgery, but keep the wounds dry during showering.  You may use an occlusive plastic wrap (Press'n Seal for example), NO SOAKING/SUBMERGING IN THE BATHTUB.  If the bandage gets wet, change with a clean dry gauze.  If the incision gets wet, pat the wound dry with a clean towel. You may start showering once you are discharged home but do not submerge  the incision under water. Just pat the incision dry and apply a dry gauze dressing on daily. Change the surgical dressing daily and reapply a dry dressing each time.    ACTIVITY Walk with your walker as instructed. Use walker as long as suggested by your caregivers. Avoid periods of inactivity such as sitting longer than an hour when not asleep. This helps prevent blood clots.  You may resume a sexual relationship in one month or when given the OK by your doctor.  You may return to work once you are cleared by your doctor.  Do not drive a car for 6 weeks or until released by you surgeon.  Do not drive while taking narcotics.  WEIGHT BEARING Partial weight bearing with assist device as directed.  25 - 50% Partial Weight Baearing to the Right Leg  POSTOPERATIVE CONSTIPATION PROTOCOL Constipation - defined medically as fewer than three stools per week and severe constipation as less than one stool per week.  One of the most common issues patients have following surgery is constipation.  Even if you have a regular bowel pattern at home, your normal regimen is likely to be disrupted due to multiple reasons following surgery.  Combination of anesthesia, postoperative narcotics, change in appetite and fluid intake all can affect your bowels.  In order to avoid complications following surgery, here are some recommendations in order to help you during your recovery period.  Colace (docusate) - Pick up an over-the-counter form of Colace or another stool softener and take  twice a day as long as you are requiring postoperative pain medications.  Take with a full glass of water daily.  If you experience loose stools or diarrhea, hold the colace until you stool forms back up.  If your symptoms do not get better within 1 week or if they get worse, check with your doctor.  Dulcolax (bisacodyl) - Pick up over-the-counter and take as directed by the product packaging as needed to assist with the movement of  your bowels.  Take with a full glass of water.  Use this product as needed if not relieved by Colace only.   MiraLax (polyethylene glycol) - Pick up over-the-counter to have on hand.  MiraLax is a solution that will increase the amount of water in your bowels to assist with bowel movements.  Take as directed and can mix with a glass of water, juice, soda, coffee, or tea.  Take if you go more than two days without a movement. Do not use MiraLax more than once per day. Call your doctor if you are still constipated or irregular after using this medication for 7 days in a row.  If you continue to have problems with postoperative constipation, please contact the office for further assistance and recommendations.  If you experience "the worst abdominal pain ever" or develop nausea or vomiting, please contact the office immediatly for further recommendations for treatment.  ITCHING  If you experience itching with your medications, try taking only a single pain pill, or even half a pain pill at a time.  You can also use Benadryl over the counter for itching or also to help with sleep.   TED HOSE STOCKINGS Wear the elastic stockings on both legs for three weeks following surgery during the day but you may remove then at night for sleeping.  MEDICATIONS See your medication summary on the After Visit Summary that the nursing staff will review with you prior to discharge.  You may have some home medications which will be placed on hold until you complete the course of blood thinner medication.  It is important for you to complete the blood thinner medication as prescribed by your surgeon.  Continue your approved medications as instructed at time of discharge.  PRECAUTIONS If you experience chest pain or shortness of breath - call 911 immediately for transfer to the hospital emergency department.  If you develop a fever greater that 101 F, purulent drainage from wound, increased redness or drainage from  wound, foul odor from the wound/dressing, or calf pain - CONTACT YOUR SURGEON.                                                   FOLLOW-UP APPOINTMENTS Make sure you keep all of your appointments after your operation with your surgeon and caregivers. You should call the office at the above phone number and make an appointment for approximately two weeks after the date of your surgery or on the date instructed by your surgeon outlined in the "After Visit Summary".  RANGE OF MOTION AND STRENGTHENING EXERCISES  These exercises are designed to help you keep full movement of your hip joint. Follow your caregiver's or physical therapist's instructions. Perform all exercises about fifteen times, three times per day or as directed. Exercise both hips, even if you have had only one joint replacement. These exercises can  be done on a training (exercise) mat, on the floor, on a table or on a bed. Use whatever works the best and is most comfortable for you. Use music or television while you are exercising so that the exercises are a pleasant break in your day. This will make your life better with the exercises acting as a break in routine you can look forward to.  Lying on your back, slowly slide your foot toward your buttocks, raising your knee up off the floor. Then slowly slide your foot back down until your leg is straight again.  Lying on your back spread your legs as far apart as you can without causing discomfort.  Lying on your side, raise your upper leg and foot straight up from the floor as far as is comfortable. Slowly lower the leg and repeat.  Lying on your back, tighten up the muscle in the front of your thigh (quadriceps muscles). You can do this by keeping your leg straight and trying to raise your heel off the floor. This helps strengthen the largest muscle supporting your knee.  Lying on your back, tighten up the muscles of your buttocks both with the legs straight and with the knee bent at a  comfortable angle while keeping your heel on the floor.      IF YOU ARE TRANSFERRED TO A SKILLED REHAB FACILITY If the patient is transferred to a skilled rehab facility following release from the hospital, a list of the current medications will be sent to the facility for the patient to continue.  When discharged from the skilled rehab facility, please have the facility set up the patient's [place] prior to being released. Also, the skilled facility will be responsible for providing the patient with their medications at time of release from the facility to include their pain medication, the muscle relaxants, and their blood thinner medication. If the patient is still at the rehab facility at time of the two week follow up appointment, the skilled rehab facility will also need to assist the patient in arranging follow up appointment in our office and any transportation needs.  MAKE SURE YOU:  Understand these instructions.  Get help right away if you are not doing well or get worse.    Pick up stool softner and laxative for home use following surgery while on pain medications. Do not submerge incision under water. Please use good hand washing techniques while changing dressing each day. May shower starting three days after surgery. Please use a clean towel to pat the incision dry following showers. Continue to use ice for pain and swelling after surgery. Do not use any lotions or creams on the incision until instructed by your surgeon.  Take a full dose 325 mg Aspirin daily for three weeks and then reduce to a baby 81 mg Aspirin daily for three additional weeks.

## 2015-11-01 NOTE — Progress Notes (Signed)
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    PT Comments    Good progress with mobility despite Hgb @ 7.3.  Pt more positive about ability to progress to dc home  Follow Up Recommendations  Home health PT;SNF     Equipment Recommendations  None recommended by PT    Recommendations for Other Services OT consult     Precautions / Restrictions Precautions Precautions: Posterior Hip Precaution Comments: Precautions reviewed Restrictions Weight Bearing Restrictions: Yes RLE Weight Bearing: Partial weight bearing RLE Partial Weight Bearing Percentage or Pounds: 25-50%    Mobility  Bed Mobility Overal bed mobility: Needs Assistance Bed Mobility: Sit to Supine       Sit to supine: Min assist;Mod assist   General bed mobility comments: cues for sequence and assistance for legs and trunk  Transfers Overall transfer level: Needs assistance Equipment used: Rolling walker (2 wheeled) Transfers: Sit to/from Stand Sit to Stand: Min assist;Mod assist         General transfer comment: assist to rise and stabilize.  Cues for LE management and use of UEs to self assist  Ambulation/Gait Ambulation/Gait assistance: Min assist;Mod assist Ambulation Distance (Feet): 19 Feet Assistive device: Rolling walker (2 wheeled) Gait Pattern/deviations: Step-to pattern;Decreased step length - right;Decreased step length - left;Shuffle;Antalgic;Trunk flexed Gait velocity: decr Gait velocity interpretation: Below normal speed for age/gender General Gait Details: cues for sequence, posture and position from [employer]            Wheelchair Mobility    Modified Rankin (Stroke Patients Only)       Balance                                     Cognition Arousal/Alertness: Awake/alert Behavior During Therapy: WFL for tasks assessed/performed Overall Cognitive Status: Within Functional Limits for tasks assessed                      Exercises Total Joint Exercises Ankle Circles/Pumps: AROM;Both;15 reps;Supine Quad Sets: AROM;Both;10 reps;Supine Heel Slides: AAROM;Right;Supine;15 reps Hip ABduction/ADduction: AAROM;Right;Supine;10 reps    General Comments        Pertinent Vitals/Pain Pain Assessment: 0-10 Pain Score: 6  Pain Location: R hip/groin Pain Descriptors / Indicators: Aching;Sore Pain Intervention(s): Limited activity within patient's tolerance;Monitored during session;Premedicated before session;Ice applied    Home Living                      Prior Function            PT Goals (current goals can now be found in the care plan section) Acute Rehab PT Goals Patient Stated Goal: healed and no further dislocation; get back to being active PT Goal Formulation: With patient Time For Goal Achievement: 11/05/15 Potential to Achieve Goals: Good Progress towards PT goals: Progressing toward goals    Frequency  7X/week    PT Plan Current plan remains appropriate    Co-evaluation             End of Session Equipment Utilized During Treatment: Gait belt Activity Tolerance: Patient tolerated treatment well;Patient limited by fatigue Patient left: in bed;with call bell/phone within reach  Time: [id number] PT Time Calculation (min) (ACUTE ONLY): 33 min  Charges:  $Gait Training: 8-22 mins $Therapeutic Exercise: 8-22 mins                    G Codes:      Misty Barker 2015-11-30, 12:18 PM

## 2015-11-01 NOTE — Progress Notes (Signed)
OT Cancellation Note  Patient Details Name: RIKIA TRINKLE MRN: [id number] DOB: 22-Nov-1945   Cancelled Treatment:    Reason Eval/Treat Not Completed: Other (comment).  Pt will be getting blood. Will check back later today or tomorrow.  Hermina Barnard 11/01/2015, 8:58 AM  Lesle Chris, OTR/L [phone] 11/01/2015

## 2015-11-01 NOTE — Progress Notes (Signed)
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    PT Comments    Pt requiring increased time for all mobility tasks but progressing well with mobility.  Follow Up Recommendations  Home health PT;SNF     Equipment Recommendations  None recommended by PT    Recommendations for Other Services OT consult     Precautions / Restrictions Precautions Precautions: Posterior Hip Precaution Comments: Precautions reviewed Restrictions Weight Bearing Restrictions: Yes RLE Weight Bearing: Partial weight bearing RLE Partial Weight Bearing Percentage or Pounds: 25-50%    Mobility  Bed Mobility Overal bed mobility: Needs Assistance Bed Mobility: Supine to Sit     Supine to sit: HOB elevated;Min guard     General bed mobility comments: cues for sequence and increased time  Transfers Overall transfer level: Needs assistance Equipment used: Rolling walker (2 wheeled) Transfers: Sit to/from Stand Sit to Stand: Min assist         General transfer comment: assist to rise and stabilize.  Cues for LE management and use of UEs to self assist  Ambulation/Gait Ambulation/Gait assistance: Min assist Ambulation Distance (Feet): 62 Feet Assistive device: Rolling walker (2 wheeled) Gait Pattern/deviations: Step-to pattern;Decreased step length - right;Shuffle;Decreased stance time - left;Trunk flexed Gait velocity: decr Gait velocity interpretation: Below normal speed for age/gender General Gait Details: cues for sequence, posture and position from RW with multiple rests for task completion.  Pt demonstrating good ability to maintain PWB on R   Stairs            Wheelchair Mobility    Modified Rankin (Stroke Patients Only)       Balance Overall  balance assessment: Needs assistance Sitting-balance support: Feet supported;No upper extremity supported Sitting balance-Leahy Scale: Good     Standing balance support: Bilateral upper extremity supported Standing balance-Leahy Scale: Fair                      Cognition Arousal/Alertness: Awake/alert Behavior During Therapy: WFL for tasks assessed/performed Overall Cognitive Status: Within Functional Limits for tasks assessed                      Exercises      General Comments        Pertinent Vitals/Pain Pain Assessment: 0-10 Pain Score: 5  Pain Location: R hip/groin Pain Descriptors / Indicators: Aching;Sore Pain Intervention(s): Limited activity within patient's tolerance;Monitored during session;Premedicated before session;Ice applied    Home Living                      Prior Function            PT Goals (current goals can now be found in the care plan section) Acute Rehab PT Goals Patient Stated Goal: healed and no further dislocation; get back to being active PT Goal Formulation: With patient Time For Goal Achievement: 11/05/15 Potential to Achieve Goals: Good Progress towards PT goals: Progressing toward goals    Frequency  7X/week    PT Plan Current plan remains appropriate    Co-evaluation             End of Session Equipment Utilized During Treatment: Gait belt Activity Tolerance: Patient tolerated treatment well Patient left: in chair;with call bell/phone within reach;with family/visitor  present     Time: [id number] PT Time Calculation (min) (ACUTE ONLY): 46 min  Charges:  $Gait Training: 23-37 mins $Therapeutic Activity: 8-22 mins                    G Codes:      Misty Barker 11-13-15, 5:50 PM

## 2015-11-01 NOTE — Clinical Documentation Improvement (Signed)
Orthopedic  Abnormal Lab/Test Results:  H/H: 7/21:  7.3/22.3 7/20:  8.3/25.0 7/19:  Pre-Op H/H per Anesthesia record: 12.2/38.6.  Possible Clinical Conditions associated with below indicators  Acute Blood Loss Anemia, including suspected or known cause  Other Condition  Cannot Clinically Determine   Supporting Information: Hemoglobin down to 7.3, will give 2 units PRBC per 7/21 progress notes.   Treatment Provided: Per 7/19 Anesthesia record 335 ml PRBC and 250 ml of albumin transfused.  Please exercise your independent, professional judgment when responding. A specific answer is not anticipated or expected.   Thank You,  [place] [phone]

## 2015-11-02 LAB — CBC
HCT: 31.2 % — ABNORMAL LOW (ref 36.0–46.0)
HEMOGLOBIN: 10.4 g/dL — AB (ref 12.0–15.0)
MCH: 26.5 pg (ref 26.0–34.0)
MCHC: 33.3 g/dL (ref 30.0–36.0)
MCV: 79.4 fL (ref 78.0–100.0)
PLATELETS: 274 10*3/uL (ref 150–400)
RBC: 3.93 MIL/uL (ref 3.87–5.11)
RDW: 17.1 % — ABNORMAL HIGH (ref 11.5–15.5)
WBC: 15.2 10*3/uL — AB (ref 4.0–10.5)

## 2015-11-02 LAB — BASIC METABOLIC PANEL
ANION GAP: 4 — AB (ref 5–15)
BUN: 14 mg/dL (ref 6–20)
CO2: 28 mmol/L (ref 22–32)
Calcium: 8.4 mg/dL — ABNORMAL LOW (ref 8.9–10.3)
Chloride: 105 mmol/L (ref 101–111)
Creatinine, Ser: 0.62 mg/dL (ref 0.44–1.00)
GFR calc Af Amer: >60 mL/min (ref >60)
Glucose, Bld: 96 mg/dL (ref 65–99)
POTASSIUM: 3.3 mmol/L — AB (ref 3.5–5.1)
SODIUM: 137 mmol/L (ref 135–145)

## 2015-11-02 NOTE — Progress Notes (Signed)
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    PT Comments    Pt cooperative but fatigued.  Reviewed therex, stairs, THP, car transfers and lower body dressing with pt and spouse.  Follow Up Recommendations  Home health PT;SNF     Equipment Recommendations  None recommended by PT    Recommendations for Other Services OT consult     Precautions / Restrictions Precautions Precautions: Posterior Hip Precaution Booklet Issued: Yes (comment) Precaution Comments: Precautions reviewed Restrictions Weight Bearing Restrictions: Yes RLE Weight Bearing: Partial weight bearing RLE Partial Weight Bearing Percentage or Pounds: 25-50%    Mobility  Bed Mobility               General bed mobility comments: Pt OOB and declines back to bed  Transfers Overall transfer level: Needs assistance Equipment used: Rolling walker (2 wheeled) Transfers: Sit to/from Stand Sit to Stand: Min assist         General transfer comment: min cues for LE management and use of UEs to self assist  Ambulation/Gait Ambulation/Gait assistance: Min guard Ambulation Distance (Feet): 28 Feet Assistive device: Rolling walker (2 wheeled) Gait Pattern/deviations: Step-to pattern;Decreased step length - right;Decreased step length - left;Shuffle;Trunk flexed Gait velocity: decr Gait velocity interpretation: Below normal speed for age/gender General Gait Details: cues for sequence, posture and position from RW with multiple rests for task completion.  Pt demonstrating good ability to maintain PWB on R   Stairs Stairs: Yes Stairs assistance: Min assist Stair Management: No rails;Step to pattern;Backwards;With walker Number of Stairs: 2 General stair comments: single step twice  backward with RW - spouse assisting on second attempt  Wheelchair Mobility    Modified Rankin (Stroke Patients Only)       Balance                                    Cognition Arousal/Alertness: Awake/alert Behavior During Therapy: WFL for tasks assessed/performed Overall Cognitive Status: Within Functional Limits for tasks assessed                      Exercises Total Joint Exercises Ankle Circles/Pumps: AROM;Both;15 reps;Supine Quad Sets: AROM;Both;10 reps;Supine Heel Slides: AAROM;Right;Supine;20 reps Hip ABduction/ADduction: AAROM;Right;Supine;15 reps    General Comments        Pertinent Vitals/Pain Pain Assessment: 0-10 Pain Score: 5  Pain Location: R hip Pain Descriptors / Indicators: Aching;Sore Pain Intervention(s): Limited activity within patient's tolerance;Monitored during session;Premedicated before session    Home Living                      Prior Function            PT Goals (current goals can now be found in the care plan section) Acute Rehab PT Goals Patient Stated Goal: healed and no further dislocation; get back to being active PT Goal Formulation: With patient Time For Goal Achievement: 11/05/15 Potential to Achieve Goals: Good Progress towards PT goals: Progressing toward goals    Frequency  7X/week    PT Plan Current plan remains appropriate    Co-evaluation             End of Session Equipment Utilized During  Treatment: Gait belt Activity Tolerance: Patient tolerated treatment well Patient left: in chair;with call bell/phone within reach;with family/visitor present     Time: [id number] PT Time Calculation (min) (ACUTE ONLY): 38 min  Charges:  $Gait Training: 23-37 mins $Therapeutic Exercise: 8-22 mins $Therapeutic Activity: 8-22 mins                    G Codes:      Eathel Pajak November 27, 2015, 2:31 PM

## 2015-11-02 NOTE — Progress Notes (Signed)
Occupational Therapy Treatment Patient Details Name: Misty Barker MRN: [id number] DOB: 1945/06/25 Today's Date: 11/02/2015    History of present illness Pt was admitted for instability of  R prosthetic hip.  She is s/p revision of acetabulum and constraint liner.   OT comments  Pt tolerated more activity today but fatiques easily  Follow Up Recommendations  Supervision/Assistance - 24 hour    Equipment Recommendations  None recommended by OT (has 3:1)    Recommendations for Other Services      Precautions / Restrictions Precautions Precautions: Posterior Hip Precaution Comments: Precautions reviewed Restrictions Weight Bearing Restrictions: Yes RLE Weight Bearing: Partial weight bearing RLE Partial Weight Bearing Percentage or Pounds: 25-50%       Mobility Bed Mobility                  Transfers   Equipment used: Rolling walker (2 wheeled) Transfers: Sit to/from Stand Sit to Stand: Min assist;Mod assist         General transfer comment: mod A from bed; min A from 3:1    Balance                                   ADL                           Toilet Transfer: BSC;RW;Stand-pivot;Moderate assistance (after mod A to stand from bed; min pivot)   Toileting- Clothing Manipulation and Hygiene: Minimal assistance;Sit to/from stand         General ADL Comments: much less assistance to stand up from 3:1 than bed.  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 She states that husband will assist her.  Ambulated around bed to chair, with extra time. Pt is not ready for shower transfer due to decreased strength and endurance. Gave her handout for sequence      Vision                     Perception     Praxis      Cognition   Behavior During Therapy: [facility] for tasks assessed/performed Overall Cognitive Status: Within Functional Limits for tasks assessed                       Extremity/Trunk  Assessment               Exercises     Shoulder Instructions       General Comments      Pertinent Vitals/ Pain       Pain Score: 5  Pain Location: R hip Pain Descriptors / Indicators: Aching ("It just hurts") Pain Intervention(s): Limited activity within patient's tolerance;Monitored during session;Premedicated before session;Repositioned;Ice applied  Home Living                                          Prior Functioning/Environment              Frequency       Progress Toward Goals  OT Goals(current goals can now be found in the care plan section)  Progress towards OT goals: Progressing toward goals  Acute Rehab OT Goals Patient Stated Goal: healed and no further dislocation; get back to being active Time For Goal  Achievement: 11/07/15  Plan      Co-evaluation                 End of Session     Activity Tolerance Patient tolerated treatment well   Patient Left in chair;with call bell/phone within reach;with chair alarm set   Nurse Communication          Time: [phone] OT Time Calculation (min): 25 min  Charges: OT General Charges $OT Visit: 1 Procedure OT Treatments $Self Care/Home Management : 23-37 mins  Deric Bocock 11/02/2015, 10:44 AM  Lesle Chris, OTR/L [phone] 11/02/2015

## 2015-11-02 NOTE — Progress Notes (Signed)
Subjective: 3 Days Post-Op Procedure(s) (LRB): RIGHT HIP ACETABULAR REVISION /CONSTRAINED LINER (POSTERIOR APPROACH)  (Right) Patient reports pain as mild to right hip.  Reports a good night. Reports she is ready for D/c. Tolerating Po's. Denies CP, SOB, or calf pain.  Objective: Vital signs in last 24 hours: Temp:  [97.7 F (36.5 C)-100.5 F (38.1 C)] 100.5 F (38.1 C) (07/22 0511) Pulse Rate:  [67-90] 90 (07/22 0511) Resp:  [16-20] 20 (07/22 0511) BP: (86-112)/(36-58) 112/58 mmHg (07/22 0511) SpO2:  [94 %-100 %] 94 % (07/22 0511)  Intake/Output from previous day: 07/21 0701 - 07/22 0700 In: 1510 [P.O.:240; I.V.:730; Blood:540] Out: 2625 [Urine:2625] Intake/Output this shift:     Recent Labs  10/30/15 1905 10/31/15 0506 11/01/15 0439 11/02/15 0510  HGB 8.8* 8.3* 7.3* 10.4*    Recent Labs  11/01/15 0439 11/02/15 0510  WBC 15.7* 15.2*  RBC 2.88* 3.93  HCT 22.3* 31.2*  PLT 267 274    Recent Labs  11/01/15 0439 11/02/15 0510  NA 136 137  K 3.9 3.3*  CL 105 105  CO2 27 28  BUN 15 14  CREATININE 0.77 0.62  GLUCOSE 105* 96  CALCIUM 8.5* 8.4*   No results for input(s): LABPT, INR in the last 72 hours.  Well nourished. Alert and oriented x3. RRR, Lungs clear, BS x4. Abdomen soft and non tender. Right Calf soft and non tender. Right hip dressing C/D/I. No DVT signs. Compartment soft. No signs of infection.  Right LE grossly neurovascular intact.  Assessment/Plan: 3 Days Post-Op Procedure(s) (LRB): RIGHT HIP ACETABULAR REVISION /CONSTRAINED LINER (POSTERIOR APPROACH)  (Right) Dressing changed Up with PT Follow instructions F/u in office D/c home with Family  Adolfo Granieri L 11/02/2015, 9:25 AM

## 2015-11-02 NOTE — Progress Notes (Signed)
Physical Therapy Treatment Patient Details Name: Misty Barker MRN: [id number] DOB: Aug 12, 1945 Today's Date: 11/02/2015    History of Present Illness Pt was admitted for instability of  R prosthetic hip.  She is s/p revision of acetabulum and constraint liner.    PT Comments    Pt progressing with mobility but apprehensive regarding return home  Follow Up Recommendations  Home health PT;SNF     Equipment Recommendations  None recommended by PT    Recommendations for Other Services OT consult     Precautions / Restrictions Precautions Precautions: Posterior Hip Precaution Comments: Precautions reviewed Restrictions Weight Bearing Restrictions: Yes RLE Weight Bearing: Partial weight bearing RLE Partial Weight Bearing Percentage or Pounds: 25-50%    Mobility  Bed Mobility               General bed mobility comments: Pt OOB and declines back to bed  Transfers Overall transfer level: Needs assistance Equipment used: Rolling walker (2 wheeled) Transfers: Sit to/from Stand Sit to Stand: Min assist         General transfer comment: min cues for LE management and use of UEs to self assist  Ambulation/Gait Ambulation/Gait assistance: Min assist Ambulation Distance (Feet): 38 Feet Assistive device: Rolling walker (2 wheeled) Gait Pattern/deviations: Step-to pattern;Decreased step length - right;Shuffle;Trunk flexed Gait velocity: decr Gait velocity interpretation: Below normal speed for age/gender General Gait Details: cues for sequence, posture and position from RW with multiple rests for task completion.  Pt demonstrating good ability to maintain PWB on R   Stairs            Wheelchair Mobility    Modified Rankin (Stroke Patients Only)       Balance                                    Cognition Arousal/Alertness: Awake/alert Behavior During Therapy: WFL for tasks assessed/performed Overall Cognitive Status: Within  Functional Limits for tasks assessed                      Exercises Total Joint Exercises Ankle Circles/Pumps: AROM;Both;15 reps;Supine Quad Sets: AROM;Both;10 reps;Supine Heel Slides: AAROM;Right;Supine;20 reps Hip ABduction/ADduction: AAROM;Right;Supine;15 reps    General Comments        Pertinent Vitals/Pain Pain Assessment: 0-10 Pain Score: 5  Pain Location: R hip Pain Descriptors / Indicators: Aching;Sore Pain Intervention(s): Limited activity within patient's tolerance;Monitored during session;Premedicated before session;Ice applied    Home Living                      Prior Function            PT Goals (current goals can now be found in the care plan section) Acute Rehab PT Goals Patient Stated Goal: healed and no further dislocation; get back to being active PT Goal Formulation: With patient Time For Goal Achievement: 11/05/15 Potential to Achieve Goals: Good Progress towards PT goals: Progressing toward goals    Frequency  7X/week    PT Plan Current plan remains appropriate    Co-evaluation             End of Session Equipment Utilized During Treatment: Gait belt Activity Tolerance: Patient tolerated treatment well Patient left: in chair;with call bell/phone within reach;with family/visitor present     Time: 0918-0958 PT Time Calculation (min) (ACUTE ONLY): 40 min  Charges:  $Gait Training: 23-37 mins $  Therapeutic Exercise: 8-22 mins                    G Codes:      Misty Barker 2015/11/26, 2:26 PM

## 2015-11-02 NOTE — Discharge Summary (Signed)
Physician Discharge Summary  [name] MRN: [id number] DOB/AGE: January 10, 1946 70 y.o.  Admit date: 10/30/2015 Discharge date: 11/02/2015  Admission Diagnoses: HIP OA  Discharge Diagnoses:  Active Problems:   Instability of prosthetic hip [facility])   Discharged Condition: good  Hospital Course:  Misty Barker is a 70 y.o. who was admitted to [facility]. They were brought to the operating room on 10/30/2015 and underwent Procedure(s): RIGHT HIP ACETABULAR REVISION /CONSTRAINED LINER (POSTERIOR APPROACH) .  Patient tolerated the procedure well and was later transferred to the recovery room and then to the orthopaedic floor for postoperative care.  They were given PO and IV analgesics for pain control following their surgery.  They were given 24 hours of postoperative antibiotics of  Anti-infectives    Start     Dose/Rate Route Frequency Ordered Stop   10/31/15 0500  vancomycin (VANCOCIN) IVPB 1000 mg/200 mL premix     1,000 mg 200 mL/hr over 60 Minutes Intravenous Every 12 hours 10/30/15 2253 10/31/15 0604   10/30/15 1500  vancomycin (VANCOCIN) IVPB 1000 mg/200 mL premix     1,000 mg 200 mL/hr over 60 Minutes Intravenous 60 min pre-op 10/30/15 1438 10/30/15 1626     and started on DVT prophylaxis. PT and OT were ordered for total joint protocol.  Discharge planning consulted to help with postop disposition and equipment needs.  Patient had a good night on the evening of surgery and started to get up OOB with therapy on day one.    Continued to work with therapy.  Blood transfusion of PRBC.Dressing was with normal limits.  The patient had progressed with therapy and meeting their goals. Patient was seen in rounds and was ready to go home.  Consults: n/a  Significant Diagnostic Studies: n/a  Treatments: PRBC's tranfused  Discharge Exam: Blood pressure 118/47, pulse 89, temperature 99.1 F (37.3 C), temperature source Oral, resp. rate 16, height  5\' 6"  (1.676 m), weight 52.4 kg (115 lb 8.3 oz), SpO2 98 %. Well nourished. Alert and oriented x3. RRR, Lungs clear, BS x4. Abdomen soft and non tender. Right Calf soft and non tender. Right hip dressing C/D/I. No DVT signs. Compartment soft. No signs of infection.  Right LE grossly neurovascular intact.  Disposition: 01-Home or Self Care  Discharge Instructions    Call MD / Call 911    Complete by:  As directed   If you experience chest pain or shortness of breath, CALL 911 and be transported to the hospital emergency room.  If you develope a fever above 101 F, pus (white drainage) or increased drainage or redness at the wound, or calf pain, call your surgeon's office.     Change dressing    Complete by:  As directed   You may change your dressing dressing daily with sterile 4 x 4 inch gauze dressing and paper tape.  Do not submerge the incision under water.     Constipation Prevention    Complete by:  As directed   Drink plenty of fluids.  Prune juice may be helpful.  You may use a stool softener, such as Colace (over the counter) 100 mg twice a day.  Use MiraLax (over the counter) for constipation as needed.     Diet - low sodium heart healthy    Complete by:  As directed      Discharge instructions    Complete by:  As directed   Pick up stool softner and laxative for home use  following surgery while on pain medications. Do not submerge incision under water. Please use good hand washing techniques while changing dressing each day. May shower starting three days after surgery. Please use a clean towel to pat the incision dry following showers. Continue to use ice for pain and swelling after surgery. Do not use any lotions or creams on the incision until instructed by your surgeon.   Postoperative Constipation Protocol  Constipation - defined medically as fewer than three stools per week and severe constipation as less than one stool per week.  One of the most common issues patients  have following surgery is constipation.  Even if you have a regular bowel pattern at home, your normal regimen is likely to be disrupted due to multiple reasons following surgery.  Combination of anesthesia, postoperative narcotics, change in appetite and fluid intake all can affect your bowels.  In order to avoid complications following surgery, here are some recommendations in order to help you during your recovery period.  Colace (docusate) - Pick up an over-the-counter form of Colace or another stool softener and take twice a day as long as you are requiring postoperative pain medications.  Take with a full glass of water daily.  If you experience loose stools or diarrhea, hold the colace until you stool forms back up.  If your symptoms do not get better within 1 week or if they get worse, check with your doctor.  Dulcolax (bisacodyl) - Pick up over-the-counter and take as directed by the product packaging as needed to assist with the movement of your bowels.  Take with a full glass of water.  Use this product as needed if not relieved by Colace only.   MiraLax (polyethylene glycol) - Pick up over-the-counter to have on hand.  MiraLax is a solution that will increase the amount of water in your bowels to assist with bowel movements.  Take as directed and can mix with a glass of water, juice, soda, coffee, or tea.  Take if you go more than two days without a movement. Do not use MiraLax more than once per day. Call your doctor if you are still constipated or irregular after using this medication for 7 days in a row.  If you continue to have problems with postoperative constipation, please contact the office for further assistance and recommendations.  If you experience "the worst abdominal pain ever" or develop nausea or vomiting, please contact the office immediatly for further recommendations for treatment.   Take a 325 mg Aspirin daily for three weeks and then resume the baby 81 mg Aspirin.  Hip  Precautions Total Hip Protocol     Do not sit on low chairs, stoools or toilet seats, as it may be difficult to get up from low surfaces    Complete by:  As directed      Driving restrictions    Complete by:  As directed   No driving until released by the physician.     Follow the hip precautions as taught in Physical Therapy    Complete by:  As directed      Increase activity slowly as tolerated    Complete by:  As directed      Lifting restrictions    Complete by:  As directed   No lifting until released by the physician.     Partial weight bearing    Complete by:  As directed   % Body Weight:  25 - 50%  Laterality:  right  Extremity:  Lower     Patient may shower    Complete by:  As directed   You may shower without a dressing once there is no drainage.  Do not wash over the wound.  If drainage remains, do not shower until drainage stops.     TED hose    Complete by:  As directed   Use stockings (TED hose) for 3 weeks on both leg(s).  You may remove them at night for sleeping.            Medication List    STOP taking these medications        amoxicillin 500 MG capsule  Commonly known as:  AMOXIL     b complex vitamins tablet     CALCIUM 500 + D PO     ciprofloxacin 250 MG tablet  Commonly known as:  CIPRO     etodolac 400 MG tablet  Commonly known as:  LODINE     VITAMIN D PO      TAKE these medications        acetaminophen 500 MG tablet  Commonly known as:  TYLENOL  Take 500 mg by mouth at bedtime as needed for moderate pain.     aspirin 325 MG EC tablet  Take 1 tablet (325 mg total) by mouth daily. Take Aspirin 325 mg daily for three weeks and then reduce back to the 81 mg Aspirin dosing at home.     cetirizine 10 MG tablet  Commonly known as:  ZYRTEC  Take 10 mg by mouth every morning.     DULoxetine 20 MG capsule  Commonly known as:  CYMBALTA  Take 1 capsule (20 mg total) by mouth daily.     furosemide 20 MG tablet  Commonly known as:  LASIX   Take 1 tablet (20 mg total) by mouth daily.     gabapentin 300 MG capsule  Commonly known as:  NEURONTIN  Take 1 capsule (300 mg total) by mouth at bedtime.     iron polysaccharides 150 MG capsule  Commonly known as:  NIFEREX  Take 1 capsule (150 mg total) by mouth 2 (two) times daily.     levothyroxine 50 MCG tablet  Commonly known as:  SYNTHROID, LEVOTHROID  Take 1 tablet (50 mcg total) by mouth daily.     liothyronine 25 MCG tablet  Commonly known as:  CYTOMEL  Take 1 tablet (25 mcg total) by mouth daily.     methocarbamol 500 MG tablet  Commonly known as:  ROBAXIN  Take 1 tablet (500 mg total) by mouth every 6 (six) hours as needed for muscle spasms.     oxyCODONE 5 MG immediate release tablet  Commonly known as:  Oxy IR/ROXICODONE  Take 1-2 tablets (5-10 mg total) by mouth every 3 (three) hours as needed for moderate pain or severe pain.           Follow-up Information    Follow up with [facility]   Why:  home health physical therapy   Contact information:   [address] [place] [address] [phone]       Follow up with Gearlean Alf, MD. Schedule an appointment as soon as possible for a visit on 11/12/2015.   Specialty:  Orthopedic Surgery   Why:  Call office at [phone] to setup appointment on Tuesday 11/12/2015 with Dr. Wynelle Link.   Contact information:   [address] [place] [address] [phone]  SignedLajean Manes 11/02/2015, 5:24 PM

## 2015-11-02 NOTE — Progress Notes (Signed)
Nurse reviewed discharge information with pt.  Pt verbalized understanding of discharge instructions, follow up appointment, new medications and dressing changes.  Prescriptions given to pt prior to discharge.

## 2015-11-03 DIAGNOSIS — M1990 Unspecified osteoarthritis, unspecified site: Secondary | ICD-10-CM | POA: Diagnosis not present

## 2015-11-03 DIAGNOSIS — M419 Scoliosis, unspecified: Secondary | ICD-10-CM | POA: Diagnosis not present

## 2015-11-03 DIAGNOSIS — Z4732 Aftercare following explantation of hip joint prosthesis: Secondary | ICD-10-CM | POA: Diagnosis not present

## 2015-11-03 DIAGNOSIS — G629 Polyneuropathy, unspecified: Secondary | ICD-10-CM | POA: Diagnosis not present

## 2015-11-03 DIAGNOSIS — F329 Major depressive disorder, single episode, unspecified: Secondary | ICD-10-CM | POA: Diagnosis not present

## 2015-11-03 DIAGNOSIS — Z9181 History of falling: Secondary | ICD-10-CM | POA: Diagnosis not present

## 2015-11-03 LAB — TYPE AND SCREEN
ABO/RH(D): AB POS
ANTIBODY SCREEN: NEGATIVE
UNIT DIVISION: 0
UNIT DIVISION: 0
Unit division: 0

## 2015-11-05 DIAGNOSIS — F329 Major depressive disorder, single episode, unspecified: Secondary | ICD-10-CM | POA: Diagnosis not present

## 2015-11-05 DIAGNOSIS — M1990 Unspecified osteoarthritis, unspecified site: Secondary | ICD-10-CM | POA: Diagnosis not present

## 2015-11-05 DIAGNOSIS — M419 Scoliosis, unspecified: Secondary | ICD-10-CM | POA: Diagnosis not present

## 2015-11-05 DIAGNOSIS — G629 Polyneuropathy, unspecified: Secondary | ICD-10-CM | POA: Diagnosis not present

## 2015-11-05 DIAGNOSIS — Z9181 History of falling: Secondary | ICD-10-CM | POA: Diagnosis not present

## 2015-11-05 DIAGNOSIS — Z4732 Aftercare following explantation of hip joint prosthesis: Secondary | ICD-10-CM | POA: Diagnosis not present

## 2015-11-07 DIAGNOSIS — Z9181 History of falling: Secondary | ICD-10-CM | POA: Diagnosis not present

## 2015-11-07 DIAGNOSIS — Z4732 Aftercare following explantation of hip joint prosthesis: Secondary | ICD-10-CM | POA: Diagnosis not present

## 2015-11-07 DIAGNOSIS — M419 Scoliosis, unspecified: Secondary | ICD-10-CM | POA: Diagnosis not present

## 2015-11-07 DIAGNOSIS — F329 Major depressive disorder, single episode, unspecified: Secondary | ICD-10-CM | POA: Diagnosis not present

## 2015-11-07 DIAGNOSIS — M1990 Unspecified osteoarthritis, unspecified site: Secondary | ICD-10-CM | POA: Diagnosis not present

## 2015-11-07 DIAGNOSIS — G629 Polyneuropathy, unspecified: Secondary | ICD-10-CM | POA: Diagnosis not present

## 2015-11-12 DIAGNOSIS — Z96641 Presence of right artificial hip joint: Secondary | ICD-10-CM | POA: Diagnosis not present

## 2015-11-12 DIAGNOSIS — Z471 Aftercare following joint replacement surgery: Secondary | ICD-10-CM | POA: Diagnosis not present

## 2015-11-13 DIAGNOSIS — M419 Scoliosis, unspecified: Secondary | ICD-10-CM | POA: Diagnosis not present

## 2015-11-13 DIAGNOSIS — F329 Major depressive disorder, single episode, unspecified: Secondary | ICD-10-CM | POA: Diagnosis not present

## 2015-11-13 DIAGNOSIS — G629 Polyneuropathy, unspecified: Secondary | ICD-10-CM | POA: Diagnosis not present

## 2015-11-13 DIAGNOSIS — M1990 Unspecified osteoarthritis, unspecified site: Secondary | ICD-10-CM | POA: Diagnosis not present

## 2015-11-13 DIAGNOSIS — Z9181 History of falling: Secondary | ICD-10-CM | POA: Diagnosis not present

## 2015-11-13 DIAGNOSIS — Z4732 Aftercare following explantation of hip joint prosthesis: Secondary | ICD-10-CM | POA: Diagnosis not present

## 2015-11-14 DIAGNOSIS — M1990 Unspecified osteoarthritis, unspecified site: Secondary | ICD-10-CM | POA: Diagnosis not present

## 2015-11-14 DIAGNOSIS — Z9181 History of falling: Secondary | ICD-10-CM | POA: Diagnosis not present

## 2015-11-14 DIAGNOSIS — Z4732 Aftercare following explantation of hip joint prosthesis: Secondary | ICD-10-CM | POA: Diagnosis not present

## 2015-11-14 DIAGNOSIS — M419 Scoliosis, unspecified: Secondary | ICD-10-CM | POA: Diagnosis not present

## 2015-11-14 DIAGNOSIS — F329 Major depressive disorder, single episode, unspecified: Secondary | ICD-10-CM | POA: Diagnosis not present

## 2015-11-14 DIAGNOSIS — G629 Polyneuropathy, unspecified: Secondary | ICD-10-CM | POA: Diagnosis not present

## 2015-11-15 DIAGNOSIS — M1990 Unspecified osteoarthritis, unspecified site: Secondary | ICD-10-CM | POA: Diagnosis not present

## 2015-11-15 DIAGNOSIS — M419 Scoliosis, unspecified: Secondary | ICD-10-CM | POA: Diagnosis not present

## 2015-11-15 DIAGNOSIS — Z4732 Aftercare following explantation of hip joint prosthesis: Secondary | ICD-10-CM | POA: Diagnosis not present

## 2015-11-15 DIAGNOSIS — F329 Major depressive disorder, single episode, unspecified: Secondary | ICD-10-CM | POA: Diagnosis not present

## 2015-11-15 DIAGNOSIS — Z9181 History of falling: Secondary | ICD-10-CM | POA: Diagnosis not present

## 2015-11-15 DIAGNOSIS — G629 Polyneuropathy, unspecified: Secondary | ICD-10-CM | POA: Diagnosis not present

## 2015-11-18 DIAGNOSIS — F329 Major depressive disorder, single episode, unspecified: Secondary | ICD-10-CM | POA: Diagnosis not present

## 2015-11-18 DIAGNOSIS — M419 Scoliosis, unspecified: Secondary | ICD-10-CM | POA: Diagnosis not present

## 2015-11-18 DIAGNOSIS — Z9181 History of falling: Secondary | ICD-10-CM | POA: Diagnosis not present

## 2015-11-18 DIAGNOSIS — G629 Polyneuropathy, unspecified: Secondary | ICD-10-CM | POA: Diagnosis not present

## 2015-11-18 DIAGNOSIS — Z4732 Aftercare following explantation of hip joint prosthesis: Secondary | ICD-10-CM | POA: Diagnosis not present

## 2015-11-18 DIAGNOSIS — M1990 Unspecified osteoarthritis, unspecified site: Secondary | ICD-10-CM | POA: Diagnosis not present

## 2015-11-20 DIAGNOSIS — M419 Scoliosis, unspecified: Secondary | ICD-10-CM | POA: Diagnosis not present

## 2015-11-20 DIAGNOSIS — F329 Major depressive disorder, single episode, unspecified: Secondary | ICD-10-CM | POA: Diagnosis not present

## 2015-11-20 DIAGNOSIS — G629 Polyneuropathy, unspecified: Secondary | ICD-10-CM | POA: Diagnosis not present

## 2015-11-20 DIAGNOSIS — M1990 Unspecified osteoarthritis, unspecified site: Secondary | ICD-10-CM | POA: Diagnosis not present

## 2015-11-20 DIAGNOSIS — Z9181 History of falling: Secondary | ICD-10-CM | POA: Diagnosis not present

## 2015-11-20 DIAGNOSIS — Z4732 Aftercare following explantation of hip joint prosthesis: Secondary | ICD-10-CM | POA: Diagnosis not present

## 2015-11-22 DIAGNOSIS — F329 Major depressive disorder, single episode, unspecified: Secondary | ICD-10-CM | POA: Diagnosis not present

## 2015-11-22 DIAGNOSIS — Z9181 History of falling: Secondary | ICD-10-CM | POA: Diagnosis not present

## 2015-11-22 DIAGNOSIS — Z4732 Aftercare following explantation of hip joint prosthesis: Secondary | ICD-10-CM | POA: Diagnosis not present

## 2015-11-22 DIAGNOSIS — G629 Polyneuropathy, unspecified: Secondary | ICD-10-CM | POA: Diagnosis not present

## 2015-11-22 DIAGNOSIS — M1990 Unspecified osteoarthritis, unspecified site: Secondary | ICD-10-CM | POA: Diagnosis not present

## 2015-11-22 DIAGNOSIS — M419 Scoliosis, unspecified: Secondary | ICD-10-CM | POA: Diagnosis not present

## 2015-11-25 DIAGNOSIS — G629 Polyneuropathy, unspecified: Secondary | ICD-10-CM | POA: Diagnosis not present

## 2015-11-25 DIAGNOSIS — M419 Scoliosis, unspecified: Secondary | ICD-10-CM | POA: Diagnosis not present

## 2015-11-25 DIAGNOSIS — Z9181 History of falling: Secondary | ICD-10-CM | POA: Diagnosis not present

## 2015-11-25 DIAGNOSIS — Z4732 Aftercare following explantation of hip joint prosthesis: Secondary | ICD-10-CM | POA: Diagnosis not present

## 2015-11-25 DIAGNOSIS — F329 Major depressive disorder, single episode, unspecified: Secondary | ICD-10-CM | POA: Diagnosis not present

## 2015-11-25 DIAGNOSIS — M1990 Unspecified osteoarthritis, unspecified site: Secondary | ICD-10-CM | POA: Diagnosis not present

## 2015-11-28 DIAGNOSIS — G629 Polyneuropathy, unspecified: Secondary | ICD-10-CM | POA: Diagnosis not present

## 2015-11-28 DIAGNOSIS — M1990 Unspecified osteoarthritis, unspecified site: Secondary | ICD-10-CM | POA: Diagnosis not present

## 2015-11-28 DIAGNOSIS — F329 Major depressive disorder, single episode, unspecified: Secondary | ICD-10-CM | POA: Diagnosis not present

## 2015-11-28 DIAGNOSIS — M419 Scoliosis, unspecified: Secondary | ICD-10-CM | POA: Diagnosis not present

## 2015-11-28 DIAGNOSIS — Z4732 Aftercare following explantation of hip joint prosthesis: Secondary | ICD-10-CM | POA: Diagnosis not present

## 2015-11-28 DIAGNOSIS — Z9181 History of falling: Secondary | ICD-10-CM | POA: Diagnosis not present

## 2015-11-30 ENCOUNTER — Other Ambulatory Visit: Payer: Self-pay | Admitting: Family Medicine

## 2015-12-04 ENCOUNTER — Other Ambulatory Visit: Payer: Self-pay | Admitting: Family Medicine

## 2015-12-10 DIAGNOSIS — Z471 Aftercare following joint replacement surgery: Secondary | ICD-10-CM | POA: Diagnosis not present

## 2015-12-10 DIAGNOSIS — Z96641 Presence of right artificial hip joint: Secondary | ICD-10-CM | POA: Diagnosis not present

## 2015-12-11 DIAGNOSIS — Z9181 History of falling: Secondary | ICD-10-CM | POA: Diagnosis not present

## 2015-12-11 DIAGNOSIS — F329 Major depressive disorder, single episode, unspecified: Secondary | ICD-10-CM | POA: Diagnosis not present

## 2015-12-11 DIAGNOSIS — M419 Scoliosis, unspecified: Secondary | ICD-10-CM | POA: Diagnosis not present

## 2015-12-11 DIAGNOSIS — M1990 Unspecified osteoarthritis, unspecified site: Secondary | ICD-10-CM | POA: Diagnosis not present

## 2015-12-11 DIAGNOSIS — G629 Polyneuropathy, unspecified: Secondary | ICD-10-CM | POA: Diagnosis not present

## 2015-12-11 DIAGNOSIS — Z4732 Aftercare following explantation of hip joint prosthesis: Secondary | ICD-10-CM | POA: Diagnosis not present

## 2015-12-13 DIAGNOSIS — F329 Major depressive disorder, single episode, unspecified: Secondary | ICD-10-CM | POA: Diagnosis not present

## 2015-12-13 DIAGNOSIS — M419 Scoliosis, unspecified: Secondary | ICD-10-CM | POA: Diagnosis not present

## 2015-12-13 DIAGNOSIS — Z4732 Aftercare following explantation of hip joint prosthesis: Secondary | ICD-10-CM | POA: Diagnosis not present

## 2015-12-13 DIAGNOSIS — Z9181 History of falling: Secondary | ICD-10-CM | POA: Diagnosis not present

## 2015-12-13 DIAGNOSIS — G629 Polyneuropathy, unspecified: Secondary | ICD-10-CM | POA: Diagnosis not present

## 2015-12-13 DIAGNOSIS — M1990 Unspecified osteoarthritis, unspecified site: Secondary | ICD-10-CM | POA: Diagnosis not present

## 2015-12-17 DIAGNOSIS — Z4732 Aftercare following explantation of hip joint prosthesis: Secondary | ICD-10-CM | POA: Diagnosis not present

## 2015-12-17 DIAGNOSIS — M419 Scoliosis, unspecified: Secondary | ICD-10-CM | POA: Diagnosis not present

## 2015-12-17 DIAGNOSIS — M1990 Unspecified osteoarthritis, unspecified site: Secondary | ICD-10-CM | POA: Diagnosis not present

## 2015-12-17 DIAGNOSIS — F329 Major depressive disorder, single episode, unspecified: Secondary | ICD-10-CM | POA: Diagnosis not present

## 2015-12-17 DIAGNOSIS — Z9181 History of falling: Secondary | ICD-10-CM | POA: Diagnosis not present

## 2015-12-17 DIAGNOSIS — G629 Polyneuropathy, unspecified: Secondary | ICD-10-CM | POA: Diagnosis not present

## 2015-12-20 DIAGNOSIS — Z4732 Aftercare following explantation of hip joint prosthesis: Secondary | ICD-10-CM | POA: Diagnosis not present

## 2015-12-20 DIAGNOSIS — M419 Scoliosis, unspecified: Secondary | ICD-10-CM | POA: Diagnosis not present

## 2015-12-20 DIAGNOSIS — Z9181 History of falling: Secondary | ICD-10-CM | POA: Diagnosis not present

## 2015-12-20 DIAGNOSIS — F329 Major depressive disorder, single episode, unspecified: Secondary | ICD-10-CM | POA: Diagnosis not present

## 2015-12-20 DIAGNOSIS — G629 Polyneuropathy, unspecified: Secondary | ICD-10-CM | POA: Diagnosis not present

## 2015-12-20 DIAGNOSIS — M1990 Unspecified osteoarthritis, unspecified site: Secondary | ICD-10-CM | POA: Diagnosis not present

## 2015-12-23 DIAGNOSIS — F329 Major depressive disorder, single episode, unspecified: Secondary | ICD-10-CM | POA: Diagnosis not present

## 2015-12-23 DIAGNOSIS — Z4732 Aftercare following explantation of hip joint prosthesis: Secondary | ICD-10-CM | POA: Diagnosis not present

## 2015-12-23 DIAGNOSIS — Z9181 History of falling: Secondary | ICD-10-CM | POA: Diagnosis not present

## 2015-12-23 DIAGNOSIS — M1990 Unspecified osteoarthritis, unspecified site: Secondary | ICD-10-CM | POA: Diagnosis not present

## 2015-12-23 DIAGNOSIS — G629 Polyneuropathy, unspecified: Secondary | ICD-10-CM | POA: Diagnosis not present

## 2015-12-23 DIAGNOSIS — M419 Scoliosis, unspecified: Secondary | ICD-10-CM | POA: Diagnosis not present

## 2015-12-26 DIAGNOSIS — G629 Polyneuropathy, unspecified: Secondary | ICD-10-CM | POA: Diagnosis not present

## 2015-12-26 DIAGNOSIS — F329 Major depressive disorder, single episode, unspecified: Secondary | ICD-10-CM | POA: Diagnosis not present

## 2015-12-26 DIAGNOSIS — Z9181 History of falling: Secondary | ICD-10-CM | POA: Diagnosis not present

## 2015-12-26 DIAGNOSIS — M1990 Unspecified osteoarthritis, unspecified site: Secondary | ICD-10-CM | POA: Diagnosis not present

## 2015-12-26 DIAGNOSIS — M419 Scoliosis, unspecified: Secondary | ICD-10-CM | POA: Diagnosis not present

## 2015-12-26 DIAGNOSIS — Z4732 Aftercare following explantation of hip joint prosthesis: Secondary | ICD-10-CM | POA: Diagnosis not present

## 2015-12-30 DIAGNOSIS — M47817 Spondylosis without myelopathy or radiculopathy, lumbosacral region: Secondary | ICD-10-CM | POA: Diagnosis not present

## 2015-12-30 DIAGNOSIS — Z79891 Long term (current) use of opiate analgesic: Secondary | ICD-10-CM | POA: Diagnosis not present

## 2015-12-30 DIAGNOSIS — M5137 Other intervertebral disc degeneration, lumbosacral region: Secondary | ICD-10-CM | POA: Diagnosis not present

## 2015-12-30 DIAGNOSIS — G629 Polyneuropathy, unspecified: Secondary | ICD-10-CM | POA: Diagnosis not present

## 2015-12-30 DIAGNOSIS — M25559 Pain in unspecified hip: Secondary | ICD-10-CM | POA: Diagnosis not present

## 2015-12-30 DIAGNOSIS — M419 Scoliosis, unspecified: Secondary | ICD-10-CM | POA: Diagnosis not present

## 2015-12-30 DIAGNOSIS — Z79899 Other long term (current) drug therapy: Secondary | ICD-10-CM | POA: Diagnosis not present

## 2015-12-30 DIAGNOSIS — Z4732 Aftercare following explantation of hip joint prosthesis: Secondary | ICD-10-CM | POA: Diagnosis not present

## 2015-12-30 DIAGNOSIS — G894 Chronic pain syndrome: Secondary | ICD-10-CM | POA: Diagnosis not present

## 2015-12-30 DIAGNOSIS — M1990 Unspecified osteoarthritis, unspecified site: Secondary | ICD-10-CM | POA: Diagnosis not present

## 2015-12-30 DIAGNOSIS — F329 Major depressive disorder, single episode, unspecified: Secondary | ICD-10-CM | POA: Diagnosis not present

## 2015-12-30 DIAGNOSIS — Z9181 History of falling: Secondary | ICD-10-CM | POA: Diagnosis not present

## 2016-01-01 DIAGNOSIS — Z4732 Aftercare following explantation of hip joint prosthesis: Secondary | ICD-10-CM | POA: Diagnosis not present

## 2016-01-01 DIAGNOSIS — Z9181 History of falling: Secondary | ICD-10-CM | POA: Diagnosis not present

## 2016-01-01 DIAGNOSIS — M419 Scoliosis, unspecified: Secondary | ICD-10-CM | POA: Diagnosis not present

## 2016-01-01 DIAGNOSIS — G629 Polyneuropathy, unspecified: Secondary | ICD-10-CM | POA: Diagnosis not present

## 2016-01-01 DIAGNOSIS — M1990 Unspecified osteoarthritis, unspecified site: Secondary | ICD-10-CM | POA: Diagnosis not present

## 2016-01-01 DIAGNOSIS — F329 Major depressive disorder, single episode, unspecified: Secondary | ICD-10-CM | POA: Diagnosis not present

## 2016-01-07 DIAGNOSIS — Z471 Aftercare following joint replacement surgery: Secondary | ICD-10-CM | POA: Diagnosis not present

## 2016-01-07 DIAGNOSIS — Z96641 Presence of right artificial hip joint: Secondary | ICD-10-CM | POA: Diagnosis not present

## 2016-01-07 DIAGNOSIS — M7061 Trochanteric bursitis, right hip: Secondary | ICD-10-CM | POA: Diagnosis not present

## 2016-01-08 DIAGNOSIS — R29898 Other symptoms and signs involving the musculoskeletal system: Secondary | ICD-10-CM | POA: Diagnosis not present

## 2016-01-13 ENCOUNTER — Telehealth: Payer: Self-pay | Admitting: Family Medicine

## 2016-01-13 NOTE — Telephone Encounter (Signed)
Relation to [id number] Call back number: Pharmacy: [place], [place] [address] [phone] (Phone) [phone] (Fax)     Reason for call:  Patient requesting cipro due to frequent urination and burning, patient states its reoccurring. Please advise

## 2016-01-13 NOTE — Telephone Encounter (Signed)
Please have her give a urine sample for urinalysis and c and s for dysuria, then we can treat.

## 2016-01-14 ENCOUNTER — Other Ambulatory Visit (INDEPENDENT_AMBULATORY_CARE_PROVIDER_SITE_OTHER): Payer: Medicare Other

## 2016-01-14 ENCOUNTER — Other Ambulatory Visit: Payer: Self-pay | Admitting: Family Medicine

## 2016-01-14 DIAGNOSIS — R300 Dysuria: Secondary | ICD-10-CM

## 2016-01-14 LAB — URINALYSIS
Bilirubin Urine: NEGATIVE
HGB URINE DIPSTICK: NEGATIVE
Ketones, ur: NEGATIVE
Leukocytes, UA: NEGATIVE
NITRITE: NEGATIVE
SPECIFIC GRAVITY, URINE: 1.015 (ref 1.000–1.030)
Total Protein, Urine: NEGATIVE
UROBILINOGEN UA: 0.2 (ref 0.0–1.0)
Urine Glucose: NEGATIVE
pH: 6 (ref 5.0–8.0)

## 2016-01-14 MED ORDER — CIPROFLOXACIN HCL 250 MG PO TABS: 250.0000 mg | ORAL_TABLET | Freq: Two times a day (BID) | ORAL | 0 refills | Status: DC

## 2016-01-14 NOTE — Telephone Encounter (Signed)
Sent in prescription as PCP instructed (patient urine has been collected already). Patient informed

## 2016-01-14 NOTE — Telephone Encounter (Signed)
Urine test ordered/appt made. Patient is in a lot of pain and will come in later this afternoon to leave sample, but wants something sent in before urine test is resulted due to the pain she is in.  She state cipro in the past has worked well for her.

## 2016-01-14 NOTE — Telephone Encounter (Signed)
I just need the urine sample before she takes the first Cipro or the culture will not help [place]. As soon as she gives sample. Cipro 250 mg tabs 1 tab po bid x 5 days.

## 2016-01-15 DIAGNOSIS — R29898 Other symptoms and signs involving the musculoskeletal system: Secondary | ICD-10-CM | POA: Diagnosis not present

## 2016-01-16 LAB — CULTURE, URINE COMPREHENSIVE: ORGANISM ID, BACTERIA: NO GROWTH

## 2016-01-17 DIAGNOSIS — R29898 Other symptoms and signs involving the musculoskeletal system: Secondary | ICD-10-CM | POA: Diagnosis not present

## 2016-01-21 DIAGNOSIS — R29898 Other symptoms and signs involving the musculoskeletal system: Secondary | ICD-10-CM | POA: Diagnosis not present

## 2016-01-23 DIAGNOSIS — R29898 Other symptoms and signs involving the musculoskeletal system: Secondary | ICD-10-CM | POA: Diagnosis not present

## 2016-01-28 DIAGNOSIS — R29898 Other symptoms and signs involving the musculoskeletal system: Secondary | ICD-10-CM | POA: Diagnosis not present

## 2016-01-30 ENCOUNTER — Other Ambulatory Visit: Payer: Self-pay | Admitting: Dermatology

## 2016-01-30 DIAGNOSIS — C44629 Squamous cell carcinoma of skin of left upper limb, including shoulder: Secondary | ICD-10-CM | POA: Diagnosis not present

## 2016-01-30 DIAGNOSIS — R29898 Other symptoms and signs involving the musculoskeletal system: Secondary | ICD-10-CM | POA: Diagnosis not present

## 2016-02-03 ENCOUNTER — Encounter: Payer: Self-pay | Admitting: Family Medicine

## 2016-02-03 ENCOUNTER — Ambulatory Visit (INDEPENDENT_AMBULATORY_CARE_PROVIDER_SITE_OTHER): Payer: Medicare Other | Admitting: Family Medicine

## 2016-02-03 VITALS — BP 118/78 | HR 66 | Temp 98.2°F | Ht 66.0 in | Wt 103.0 lb

## 2016-02-03 DIAGNOSIS — E079 Disorder of thyroid, unspecified: Secondary | ICD-10-CM

## 2016-02-03 DIAGNOSIS — M1611 Unilateral primary osteoarthritis, right hip: Secondary | ICD-10-CM

## 2016-02-03 DIAGNOSIS — H9191 Unspecified hearing loss, right ear: Secondary | ICD-10-CM

## 2016-02-03 DIAGNOSIS — Z1231 Encounter for screening mammogram for malignant neoplasm of breast: Secondary | ICD-10-CM

## 2016-02-03 DIAGNOSIS — D508 Other iron deficiency anemias: Secondary | ICD-10-CM

## 2016-02-03 DIAGNOSIS — E785 Hyperlipidemia, unspecified: Secondary | ICD-10-CM

## 2016-02-03 DIAGNOSIS — Z1211 Encounter for screening for malignant neoplasm of colon: Secondary | ICD-10-CM

## 2016-02-03 DIAGNOSIS — Z0000 Encounter for general adult medical examination without abnormal findings: Secondary | ICD-10-CM | POA: Diagnosis not present

## 2016-02-03 DIAGNOSIS — Z1239 Encounter for other screening for malignant neoplasm of breast: Secondary | ICD-10-CM

## 2016-02-03 DIAGNOSIS — Z96649 Presence of unspecified artificial hip joint: Secondary | ICD-10-CM

## 2016-02-03 DIAGNOSIS — T84028S Dislocation of other internal joint prosthesis, sequela: Secondary | ICD-10-CM

## 2016-02-03 LAB — COMPREHENSIVE METABOLIC PANEL
ALK PHOS: 78 U/L (ref 39–117)
ALT: 16 U/L (ref 0–35)
AST: 23 U/L (ref 0–37)
Albumin: 4.7 g/dL (ref 3.5–5.2)
BILIRUBIN TOTAL: 0.5 mg/dL (ref 0.2–1.2)
BUN: 12 mg/dL (ref 6–23)
CALCIUM: 10.1 mg/dL (ref 8.4–10.5)
CO2: 32 mEq/L (ref 19–32)
CREATININE: 0.75 mg/dL (ref 0.40–1.20)
Chloride: 99 mEq/L (ref 96–112)
GFR: 80.79 mL/min (ref >60.00)
GLUCOSE: 86 mg/dL (ref 70–99)
Potassium: 4 mEq/L (ref 3.5–5.1)
Sodium: 139 mEq/L (ref 135–145)
TOTAL PROTEIN: 7.3 g/dL (ref 6.0–8.3)

## 2016-02-03 LAB — LIPID PANEL
Cholesterol: 241 mg/dL — ABNORMAL HIGH (ref 0–200)
HDL: 83.1 mg/dL (ref >39.00)
LDL Cholesterol: 140 mg/dL — ABNORMAL HIGH (ref 0–99)
NONHDL: 157.89
Total CHOL/HDL Ratio: 3
Triglycerides: 91 mg/dL (ref 0.0–149.0)
VLDL: 18.2 mg/dL (ref 0.0–40.0)

## 2016-02-03 LAB — T4, FREE: Free T4: 0.54 ng/dL — ABNORMAL LOW (ref 0.60–1.60)

## 2016-02-03 LAB — CBC
HCT: 42.4 % (ref 36.0–46.0)
HEMOGLOBIN: 13.7 g/dL (ref 12.0–15.0)
MCHC: 32.4 g/dL (ref 30.0–36.0)
MCV: 81.4 fl (ref 78.0–100.0)
PLATELETS: 276 10*3/uL (ref 150.0–400.0)
RBC: 5.21 Mil/uL — ABNORMAL HIGH (ref 3.87–5.11)
RDW: 15.1 % (ref 11.5–15.5)
WBC: 7.4 10*3/uL (ref 4.0–10.5)

## 2016-02-03 LAB — TSH: TSH: 0.05 u[IU]/mL — ABNORMAL LOW (ref 0.35–4.50)

## 2016-02-03 LAB — VITAMIN D 25 HYDROXY (VIT D DEFICIENCY, FRACTURES): VITD: 52.69 ng/mL (ref 30.00–100.00)

## 2016-02-03 MED ORDER — GABAPENTIN 300 MG PO CAPS: 300.0000 mg | ORAL_CAPSULE | Freq: Every day | ORAL | 3 refills | Status: DC

## 2016-02-03 MED ORDER — FUROSEMIDE 20 MG PO TABS: 20.0000 mg | ORAL_TABLET | Freq: Every day | ORAL | 3 refills | Status: DC

## 2016-02-03 MED ORDER — LIOTHYRONINE SODIUM 25 MCG PO TABS: 25.0000 ug | ORAL_TABLET | Freq: Every day | ORAL | 3 refills | Status: DC

## 2016-02-03 MED ORDER — DULOXETINE HCL 30 MG PO CPEP: 30.0000 mg | ORAL_CAPSULE | Freq: Every day | ORAL | 1 refills | Status: DC

## 2016-02-03 MED ORDER — LEVOTHYROXINE SODIUM 50 MCG PO TABS: 50.0000 ug | ORAL_TABLET | Freq: Every day | ORAL | 3 refills | Status: DC

## 2016-02-03 NOTE — Assessment & Plan Note (Signed)
Right hip pain  

## 2016-02-03 NOTE — Patient Instructions (Addendum)
Advance Directive Advance directives are the legal documents that allow you to make choices about your health care and medical treatment if you cannot speak for yourself. Advance directives are a way for you to communicate your wishes to family, friends, and health care providers. The specified people can then convey your decisions about end-of-life care to avoid confusion if you should become unable to communicate. Ideally, the process of discussing and writing advance directives should happen over time rather than making decisions all at once. Advance directives can be modified as your situation changes, and you can change your mind at any time, even after you have signed the advance directives. Each state has its own laws regarding advance directives. You may want to check with your health care provider, attorney, or state representative about the law in your state. Below are some examples of advance directives. LIVING WILL A living will is a set of instructions documenting your wishes about medical care when you cannot care for yourself. It is used if you become:  Terminally ill.  Incapacitated.  Unable to communicate.  Unable to make decisions. Items to consider in your living will include:  The use or non-use of life-sustaining equipment, such as dialysis machines and breathing machines (ventilators).  A do not resuscitate (DNR) order, which is the instruction not to use cardiopulmonary resuscitation (CPR) if breathing or heartbeat stops.  Tube feeding.  Withholding of food and fluids.  Comfort (palliative) care when the goal becomes comfort rather than a cure.  Organ and tissue donation. A living will does not give instructions about distribution of your money and property if you should pass away. It is advisable to seek the expert advice of a lawyer in drawing up a will regarding your possessions. Decisions about taxes, beneficiaries, and asset distribution will be legally binding.  This process can relieve your family and friends of any burdens surrounding disputes or questions that may come up about the allocation of your assets. DO NOT RESUSCITATE (DNR) A do not resuscitate (DNR) order is a request to not have CPR in the event that your heart stops beating or you stop breathing. Unless given other instructions, a health care provider will try to help any patient whose heart has stopped or who has stopped breathing.  HEALTH CARE PROXY AND DURABLE POWER OF ATTORNEY FOR HEALTH CARE A health care proxy is a person (agent) appointed to make medical decisions for you if you cannot. Generally, people choose someone they know well and trust to represent their preferences when they can no longer do so. You should be sure to ask this person for agreement to act as your agent. An agent may have to exercise judgment in the event of a medical decision for which your wishes are not known. The durable power of attorney for health care is the legal document that names your health care proxy. Once written, it should be:  Signed.  Notarized.  Dated.  Copied.  Witnessed.  Incorporated into your medical record. You may also want to appoint someone to manage your financial affairs if you cannot. This is called a durable power of attorney for finances. It is a separate legal document from the durable power of attorney for health care. You may choose the same person or someone different from your health care proxy to act as your agent in financial matters.   This information is not intended to replace advice given to you by your health care provider. Make sure you discuss any  questions you have with your health care provider.   Document Released: 07/07/2007 Document Revised: 04/04/2013 Document Reviewed: 08/17/2012 Elsevier Interactive Patient Education 2016 [employer].  Preventive Care for Adults, Female A healthy lifestyle and preventive care can promote health and wellness.  Preventive health guidelines for women include the following key practices.  A routine yearly physical is a good way to check with your health care provider about your health and preventive screening. It is a chance to share any concerns and updates on your health and to receive a thorough exam.  Visit your dentist for a routine exam and preventive care every 6 months. Brush your teeth twice a day and floss once a day. Good oral hygiene prevents tooth decay and gum disease.  The frequency of eye exams is based on your age, health, family medical history, use of contact lenses, and other factors. Follow your health care provider's recommendations for frequency of eye exams.  Eat a healthy diet. Foods like vegetables, fruits, whole grains, low-fat dairy products, and lean protein foods contain the nutrients you need without too many calories. Decrease your intake of foods high in solid fats, added sugars, and salt. Eat the right amount of calories for you.Get information about a proper diet from your health care provider, if necessary.  Regular physical exercise is one of the most important things you can do for your health. Most adults should get at least 150 minutes of moderate-intensity exercise (any activity that increases your heart rate and causes you to sweat) each week. In addition, most adults need muscle-strengthening exercises on 2 or more days a week.  Maintain a healthy weight. The body mass index (BMI) is a screening tool to identify possible weight problems. It provides an estimate of body fat based on height and weight. Your health care provider can find your BMI and can help you achieve or maintain a healthy weight.For adults 20 years and older:  A BMI below 18.5 is considered underweight.  A BMI of 18.5 to 24.9 is normal.  A BMI of 25 to 29.9 is considered overweight.  A BMI of 30 and above is considered obese.  Maintain normal blood lipids and cholesterol levels by exercising  and minimizing your intake of saturated fat. Eat a balanced diet with plenty of fruit and vegetables. Blood tests for lipids and cholesterol should begin at age 56 and be repeated every 5 years. If your lipid or cholesterol levels are high, you are over 50, or you are at high risk for heart disease, you may need your cholesterol levels checked more frequently.Ongoing high lipid and cholesterol levels should be treated with medicines if diet and exercise are not working.  If you smoke, find out from your health care provider how to quit. If you do not use tobacco, do not start.  Lung cancer screening is recommended for adults aged 55-80 years who are at high risk for developing lung cancer because of a history of smoking. A yearly low-dose CT scan of the lungs is recommended for people who have at least a 30-pack-year history of smoking and are a current smoker or have quit within the past 15 years. A pack year of smoking is smoking an average of 1 pack of cigarettes a day for 1 year (for example: 1 pack a day for 30 years or 2 packs a day for 15 years). Yearly screening should continue until the smoker has stopped smoking for at least 15 years. Yearly screening should be stopped  for people who develop a health problem that would prevent them from having lung cancer treatment.  If you are pregnant, do not drink alcohol. If you are breastfeeding, be very cautious about drinking alcohol. If you are not pregnant and choose to drink alcohol, do not have more than 1 drink per day. One drink is considered to be 12 ounces (355 mL) of beer, 5 ounces (148 mL) of wine, or 1.5 ounces (44 mL) of liquor.  Avoid use of street drugs. Do not share needles with anyone. Ask for help if you need support or instructions about stopping the use of drugs.  High blood pressure causes heart disease and increases the risk of stroke. Your blood pressure should be checked at least every 1 to 2 years. Ongoing high blood pressure  should be treated with medicines if weight loss and exercise do not work.  If you are 55-4 years old, ask your health care provider if you should take aspirin to prevent strokes.  Diabetes screening is done by taking a blood sample to check your blood glucose level after you have not eaten for a certain period of time (fasting). If you are not overweight and you do not have risk factors for diabetes, you should be screened once every 3 years starting at age 21. If you are overweight or obese and you are 78-55 years of age, you should be screened for diabetes every year as part of your cardiovascular risk assessment.  Breast cancer screening is essential preventive care for women. You should practice "breast self-awareness." This means understanding the normal appearance and feel of your breasts and may include breast self-examination. Any changes detected, no matter how small, should be reported to a health care provider. Women in their 79s and 30s should have a clinical breast exam (CBE) by a health care provider as part of a regular health exam every 1 to 3 years. After age 4, women should have a CBE every year. Starting at age 48, women should consider having a mammogram (breast X-ray test) every year. Women who have a family history of breast cancer should talk to their health care provider about genetic screening. Women at a high risk of breast cancer should talk to their health care providers about having an MRI and a mammogram every year.  Breast cancer gene (BRCA)-related cancer risk assessment is recommended for women who have family members with BRCA-related cancers. BRCA-related cancers include breast, ovarian, tubal, and peritoneal cancers. Having family members with these cancers may be associated with an increased risk for harmful changes (mutations) in the breast cancer genes BRCA1 and BRCA2. Results of the assessment will determine the need for genetic counseling and BRCA1 and BRCA2  testing.  Your health care provider may recommend that you be screened regularly for cancer of the pelvic organs (ovaries, uterus, and vagina). This screening involves a pelvic examination, including checking for microscopic changes to the surface of your cervix (Pap test). You may be encouraged to have this screening done every 3 years, beginning at age 61.  For women ages 92-65, health care providers may recommend pelvic exams and Pap testing every 3 years, or they may recommend the Pap and pelvic exam, combined with testing for human papilloma virus (HPV), every 5 years. Some types of HPV increase your risk of cervical cancer. Testing for HPV may also be done on women of any age with unclear Pap test results.  Other health care providers may not recommend any screening for nonpregnant  women who are considered low risk for pelvic cancer and who do not have symptoms. Ask your health care provider if a screening pelvic exam is right for you.  If you have had past treatment for cervical cancer or a condition that could lead to cancer, you need Pap tests and screening for cancer for at least 20 years after your treatment. If Pap tests have been discontinued, your risk factors (such as having a new sexual partner) need to be reassessed to determine if screening should resume. Some women have medical problems that increase the chance of getting cervical cancer. In these cases, your health care provider may recommend more frequent screening and Pap tests.  Colorectal cancer can be detected and often prevented. Most routine colorectal cancer screening begins at the age of 54 years and continues through age 9 years. However, your health care provider may recommend screening at an earlier age if you have risk factors for colon cancer. On a yearly basis, your health care provider may provide home test kits to check for hidden blood in the stool. Use of a small camera at the end of a tube, to directly examine the  colon (sigmoidoscopy or colonoscopy), can detect the earliest forms of colorectal cancer. Talk to your health care provider about this at age 49, when routine screening begins. Direct exam of the colon should be repeated every 5-10 years through age 46 years, unless early forms of precancerous polyps or small growths are found.  People who are at an increased risk for hepatitis B should be screened for this virus. You are considered at high risk for hepatitis B if:  You were born in a country where hepatitis B occurs often. Talk with your health care provider about which countries are considered high risk.  Your parents were born in a high-risk country and you have not received a shot to protect against hepatitis B (hepatitis B vaccine).  You have HIV or AIDS.  You use needles to inject street drugs.  You live with, or have sex with, someone who has hepatitis B.  You get hemodialysis treatment.  You take certain medicines for conditions like cancer, organ transplantation, and autoimmune conditions.  Hepatitis C blood testing is recommended for all people born from [birth year] through 1966 and any individual with known risks for hepatitis C.  Practice safe sex. Use condoms and avoid high-risk sexual practices to reduce the spread of sexually transmitted infections (STIs). STIs include gonorrhea, chlamydia, syphilis, trichomonas, herpes, HPV, and human immunodeficiency virus (HIV). Herpes, HIV, and HPV are viral illnesses that have no cure. They can result in disability, cancer, and death.  You should be screened for sexually transmitted illnesses (STIs) including gonorrhea and chlamydia if:  You are sexually active and are younger than 24 years.  You are older than 24 years and your health care provider tells you that you are at risk for this type of infection.  Your sexual activity has changed since you were last screened and you are at an increased risk for chlamydia or gonorrhea. Ask your  health care provider if you are at risk.  If you are at risk of being infected with HIV, it is recommended that you take a prescription medicine daily to prevent HIV infection. This is called preexposure prophylaxis (PrEP). You are considered at risk if:  You are sexually active and do not regularly use condoms or know the HIV status of your partner(s).  You take drugs by injection.  You are  sexually active with a partner who has HIV.  Talk with your health care provider about whether you are at high risk of being infected with HIV. If you choose to begin PrEP, you should first be tested for HIV. You should then be tested every 3 months for as long as you are taking PrEP.  Osteoporosis is a disease in which the bones lose minerals and strength with aging. This can result in serious bone fractures or breaks. The risk of osteoporosis can be identified using a bone density scan. Women ages 18 years and over and women at risk for fractures or osteoporosis should discuss screening with their health care providers. Ask your health care provider whether you should take a calcium supplement or vitamin D to reduce the rate of osteoporosis.  Menopause can be associated with physical symptoms and risks. Hormone replacement therapy is available to decrease symptoms and risks. You should talk to your health care provider about whether hormone replacement therapy is right for you.  Use sunscreen. Apply sunscreen liberally and repeatedly throughout the day. You should seek shade when your shadow is shorter than you. Protect yourself by wearing long sleeves, pants, a wide-brimmed hat, and sunglasses year round, whenever you are outdoors.  Once a month, do a whole body skin exam, using a mirror to look at the skin on your back. Tell your health care provider of new moles, moles that have irregular borders, moles that are larger than a pencil eraser, or moles that have changed in shape or color.  Stay current with  required vaccines (immunizations).  Influenza vaccine. All adults should be immunized every year.  Tetanus, diphtheria, and acellular pertussis (Td, Tdap) vaccine. Pregnant women should receive 1 dose of Tdap vaccine during each pregnancy. The dose should be obtained regardless of the length of time since the last dose. Immunization is preferred during the 27th-36th week of gestation. An adult who has not previously received Tdap or who does not know her vaccine status should receive 1 dose of Tdap. This initial dose should be followed by tetanus and diphtheria toxoids (Td) booster doses every 10 years. Adults with an unknown or incomplete history of completing a 3-dose immunization series with Td-containing vaccines should begin or complete a primary immunization series including a Tdap dose. Adults should receive a Td booster every 10 years.  Varicella vaccine. An adult without evidence of immunity to varicella should receive 2 doses or a second dose if she has previously received 1 dose. Pregnant females who do not have evidence of immunity should receive the first dose after pregnancy. This first dose should be obtained before leaving the health care facility. The second dose should be obtained 4-8 weeks after the first dose.  Human papillomavirus (HPV) vaccine. Females aged 13-26 years who have not received the vaccine previously should obtain the 3-dose series. The vaccine is not recommended for use in pregnant females. However, pregnancy testing is not needed before receiving a dose. If a female is found to be pregnant after receiving a dose, no treatment is needed. In that case, the remaining doses should be delayed until after the pregnancy. Immunization is recommended for any person with an immunocompromised condition through the age of 26 years if she did not get any or all doses earlier. During the 3-dose series, the second dose should be obtained 4-8 weeks after the first dose. The third dose  should be obtained 24 weeks after the first dose and 16 weeks after the second dose.  Zoster vaccine. One dose is recommended for adults aged 39 years or older unless certain conditions are present.  Measles, mumps, and rubella (MMR) vaccine. Adults born before [birth year] generally are considered immune to measles and mumps. Adults born in [birth year] or later should have 1 or more doses of MMR vaccine unless there is a contraindication to the vaccine or there is laboratory evidence of immunity to each of the three diseases. A routine second dose of MMR vaccine should be obtained at least 28 days after the first dose for students attending postsecondary schools, health care workers, or international travelers. People who received inactivated measles vaccine or an unknown type of measles vaccine during 1963-1967 should receive 2 doses of MMR vaccine. People who received inactivated mumps vaccine or an unknown type of mumps vaccine before 1979 and are at high risk for mumps infection should consider immunization with 2 doses of MMR vaccine. For females of childbearing age, rubella immunity should be determined. If there is no evidence of immunity, females who are not pregnant should be vaccinated. If there is no evidence of immunity, females who are pregnant should delay immunization until after pregnancy. Unvaccinated health care workers born before [birth year] who lack laboratory evidence of measles, mumps, or rubella immunity or laboratory confirmation of disease should consider measles and mumps immunization with 2 doses of MMR vaccine or rubella immunization with 1 dose of MMR vaccine.  Pneumococcal 13-valent conjugate (PCV13) vaccine. When indicated, a person who is uncertain of his immunization history and has no record of immunization should receive the PCV13 vaccine. All adults 23 years of age and older should receive this vaccine. An adult aged 13 years or older who has certain medical conditions and has not been  previously immunized should receive 1 dose of PCV13 vaccine. This PCV13 should be followed with a dose of pneumococcal polysaccharide (PPSV23) vaccine. Adults who are at high risk for pneumococcal disease should obtain the PPSV23 vaccine at least 8 weeks after the dose of PCV13 vaccine. Adults older than 71 years of age who have normal immune system function should obtain the PPSV23 vaccine dose at least 1 year after the dose of PCV13 vaccine.  Pneumococcal polysaccharide (PPSV23) vaccine. When PCV13 is also indicated, PCV13 should be obtained first. All adults aged 62 years and older should be immunized. An adult younger than age 24 years who has certain medical conditions should be immunized. Any person who resides in a nursing home or long-term care facility should be immunized. An adult smoker should be immunized. People with an immunocompromised condition and certain other conditions should receive both PCV13 and PPSV23 vaccines. People with human immunodeficiency virus (HIV) infection should be immunized as soon as possible after diagnosis. Immunization during chemotherapy or radiation therapy should be avoided. Routine use of PPSV23 vaccine is not recommended for American Indians, [place] Natives, or people younger than 65 years unless there are medical conditions that require PPSV23 vaccine. When indicated, people who have unknown immunization and have no record of immunization should receive PPSV23 vaccine. One-time revaccination 5 years after the first dose of PPSV23 is recommended for people aged 19-64 years who have chronic kidney failure, nephrotic syndrome, asplenia, or immunocompromised conditions. People who received 1-2 doses of PPSV23 before age 8 years should receive another dose of PPSV23 vaccine at age 65 years or later if at least 5 years have passed since the previous dose. Doses of PPSV23 are not needed for people immunized with PPSV23 at or after age 63 years.  Meningococcal vaccine.  Adults with asplenia or persistent complement component deficiencies should receive 2 doses of quadrivalent meningococcal conjugate (MenACWY-D) vaccine. The doses should be obtained at least 2 months apart. Microbiologists working with certain meningococcal bacteria, [employer] recruits, people at risk during an outbreak, and people who travel to or live in countries with a high rate of meningitis should be immunized. A first-year college student up through age 95 years who is living in a residence hall should receive a dose if she did not receive a dose on or after her 16th birthday. Adults who have certain high-risk conditions should receive one or more doses of vaccine.  Hepatitis A vaccine. Adults who wish to be protected from this disease, have certain high-risk conditions, work with hepatitis A-infected animals, work in hepatitis A research labs, or travel to or work in countries with a high rate of hepatitis A should be immunized. Adults who were previously unvaccinated and who anticipate close contact with an international adoptee during the first 60 days after arrival in the [place] from a country with a high rate of hepatitis A should be immunized.  Hepatitis B vaccine. Adults who wish to be protected from this disease, have certain high-risk conditions, may be exposed to blood or other infectious body fluids, are household contacts or sex partners of hepatitis B positive people, are clients or workers in certain care facilities, or travel to or work in countries with a high rate of hepatitis B should be immunized.  Haemophilus influenzae type b (Hib) vaccine. A previously unvaccinated person with asplenia or sickle cell disease or having a scheduled splenectomy should receive 1 dose of Hib vaccine. Regardless of previous immunization, a recipient of a hematopoietic stem cell transplant should receive a 3-dose series 6-12 months after her successful transplant. Hib vaccine is not recommended for  adults with HIV infection. Preventive Services / Frequency Ages 62 to 37 years  Blood pressure check.** / Every 3-5 years.  Lipid and cholesterol check.** / Every 5 years beginning at age 27.  Clinical breast exam.** / Every 3 years for women in their 51s and 80s.  BRCA-related cancer risk assessment.** / For women who have family members with a BRCA-related cancer (breast, ovarian, tubal, or peritoneal cancers).  Pap test.** / Every 2 years from ages 31 through 9. Every 3 years starting at age 80 through age 75 or 44 with a history of 3 consecutive normal Pap tests.  HPV screening.** / Every 3 years from ages 63 through ages 7 to 68 with a history of 3 consecutive normal Pap tests.  Hepatitis C blood test.** / For any individual with known risks for hepatitis C.  Skin self-exam. / Monthly.  Influenza vaccine. / Every year.  Tetanus, diphtheria, and acellular pertussis (Tdap, Td) vaccine.** / Consult your health care provider. Pregnant women should receive 1 dose of Tdap vaccine during each pregnancy. 1 dose of Td every 10 years.  Varicella vaccine.** / Consult your health care provider. Pregnant females who do not have evidence of immunity should receive the first dose after pregnancy.  HPV vaccine. / 3 doses over 6 months, if 68 and younger. The vaccine is not recommended for use in pregnant females. However, pregnancy testing is not needed before receiving a dose.  Measles, mumps, rubella (MMR) vaccine.** / You need at least 1 dose of MMR if you were born in 1957 or later. You may also need a 2nd dose. For females of childbearing age, rubella immunity should be  determined. If there is no evidence of immunity, females who are not pregnant should be vaccinated. If there is no evidence of immunity, females who are pregnant should delay immunization until after pregnancy.  Pneumococcal 13-valent conjugate (PCV13) vaccine.** / Consult your health care provider.  Pneumococcal  polysaccharide (PPSV23) vaccine.** / 1 to 2 doses if you smoke cigarettes or if you have certain conditions.  Meningococcal vaccine.** / 1 dose if you are age 47 to 34 years and a Market researcher living in a residence hall, or have one of several medical conditions, you need to get vaccinated against meningococcal disease. You may also need additional booster doses.  Hepatitis A vaccine.** / Consult your health care provider.  Hepatitis B vaccine.** / Consult your health care provider.  Haemophilus influenzae type b (Hib) vaccine.** / Consult your health care provider. Ages 38 to 44 years  Blood pressure check.** / Every year.  Lipid and cholesterol check.** / Every 5 years beginning at age 66 years.  Lung cancer screening. / Every year if you are aged 82-80 years and have a 30-pack-year history of smoking and currently smoke or have quit within the past 15 years. Yearly screening is stopped once you have quit smoking for at least 15 years or develop a health problem that would prevent you from having lung cancer treatment.  Clinical breast exam.** / Every year after age 88 years.  BRCA-related cancer risk assessment.** / For women who have family members with a BRCA-related cancer (breast, ovarian, tubal, or peritoneal cancers).  Mammogram.** / Every year beginning at age 55 years and continuing for as long as you are in good health. Consult with your health care provider.  Pap test.** / Every 3 years starting at age 37 years through age 42 or 67 years with a history of 3 consecutive normal Pap tests.  HPV screening.** / Every 3 years from ages 16 years through ages 44 to 82 years with a history of 3 consecutive normal Pap tests.  Fecal occult blood test (FOBT) of stool. / Every year beginning at age 22 years and continuing until age 41 years. You may not need to do this test if you get a colonoscopy every 10 years.  Flexible sigmoidoscopy or colonoscopy.** / Every 5 years  for a flexible sigmoidoscopy or every 10 years for a colonoscopy beginning at age 63 years and continuing until age 62 years.  Hepatitis C blood test.** / For all people born from [birth year] through 1965 and any individual with known risks for hepatitis C.  Skin self-exam. / Monthly.  Influenza vaccine. / Every year.  Tetanus, diphtheria, and acellular pertussis (Tdap/Td) vaccine.** / Consult your health care provider. Pregnant women should receive 1 dose of Tdap vaccine during each pregnancy. 1 dose of Td every 10 years.  Varicella vaccine.** / Consult your health care provider. Pregnant females who do not have evidence of immunity should receive the first dose after pregnancy.  Zoster vaccine.** / 1 dose for adults aged 84 years or older.  Measles, mumps, rubella (MMR) vaccine.** / You need at least 1 dose of MMR if you were born in 1957 or later. You may also need a second dose. For females of childbearing age, rubella immunity should be determined. If there is no evidence of immunity, females who are not pregnant should be vaccinated. If there is no evidence of immunity, females who are pregnant should delay immunization until after pregnancy.  Pneumococcal 13-valent conjugate (PCV13) vaccine.** / Consult your health  care provider.  Pneumococcal polysaccharide (PPSV23) vaccine.** / 1 to 2 doses if you smoke cigarettes or if you have certain conditions.  Meningococcal vaccine.** / Consult your health care provider.  Hepatitis A vaccine.** / Consult your health care provider.  Hepatitis B vaccine.** / Consult your health care provider.  Haemophilus influenzae type b (Hib) vaccine.** / Consult your health care provider. Ages 65 years and over  Blood pressure check.** / Every year.  Lipid and cholesterol check.** / Every 5 years beginning at age 28 years.  Lung cancer screening. / Every year if you are aged 55-80 years and have a 30-pack-year history of smoking and currently smoke or have  quit within the past 15 years. Yearly screening is stopped once you have quit smoking for at least 15 years or develop a health problem that would prevent you from having lung cancer treatment.  Clinical breast exam.** / Every year after age 67 years.  BRCA-related cancer risk assessment.** / For women who have family members with a BRCA-related cancer (breast, ovarian, tubal, or peritoneal cancers).  Mammogram.** / Every year beginning at age 9 years and continuing for as long as you are in good health. Consult with your health care provider.  Pap test.** / Every 3 years starting at age 21 years through age 7 or 28 years with 3 consecutive normal Pap tests. Testing can be stopped between 65 and 70 years with 3 consecutive normal Pap tests and no abnormal Pap or HPV tests in the past 10 years.  HPV screening.** / Every 3 years from ages 27 years through ages 47 or 78 years with a history of 3 consecutive normal Pap tests. Testing can be stopped between 65 and 70 years with 3 consecutive normal Pap tests and no abnormal Pap or HPV tests in the past 10 years.  Fecal occult blood test (FOBT) of stool. / Every year beginning at age 25 years and continuing until age 82 years. You may not need to do this test if you get a colonoscopy every 10 years.  Flexible sigmoidoscopy or colonoscopy.** / Every 5 years for a flexible sigmoidoscopy or every 10 years for a colonoscopy beginning at age 26 years and continuing until age 8 years.  Hepatitis C blood test.** / For all people born from [birth year] through 1965 and any individual with known risks for hepatitis C.  Osteoporosis screening.** / A one-time screening for women ages 53 years and over and women at risk for fractures or osteoporosis.  Skin self-exam. / Monthly.  Influenza vaccine. / Every year.  Tetanus, diphtheria, and acellular pertussis (Tdap/Td) vaccine.** / 1 dose of Td every 10 years.  Varicella vaccine.** / Consult your health care  provider.  Zoster vaccine.** / 1 dose for adults aged 26 years or older.  Pneumococcal 13-valent conjugate (PCV13) vaccine.** / Consult your health care provider.  Pneumococcal polysaccharide (PPSV23) vaccine.** / 1 dose for all adults aged 6 years and older.  Meningococcal vaccine.** / Consult your health care provider.  Hepatitis A vaccine.** / Consult your health care provider.  Hepatitis B vaccine.** / Consult your health care provider.  Haemophilus influenzae type b (Hib) vaccine.** / Consult your health care provider. ** Family history and personal history of risk and conditions may change your health care provider's recommendations.   This information is not intended to replace advice given to you by your health care provider. Make sure you discuss any questions you have with your health care provider.   Document Released:  05/26/2001 Document Revised: 04/20/2014 Document Reviewed: 08/25/2010 Elsevier Interactive Patient Education [employer].

## 2016-02-03 NOTE — Progress Notes (Addendum)
Subjective:   Misty Barker is a 70 y.o. female who presents for an Initial Medicare Annual Wellness Visit.  Review of Systems    No ROS.  Medicare Wellness Visit.  Cardiac Risk Factors include: advanced age (>75men, >70 women);dyslipidemia  Sleep patterns: Some interrupted sleep d/t R hip pain. Usually sleeps 8 hrs nightly. Gets up 2-3x nightly to void. Has some trouble falling asleep. Sometimes feels rested on waking, sometimes does not.     Home Safety/Smoke Alarms: Lives at home w/ husband. Feels safe in home. Smoke detectors present.   Living environment; residence and Firearm Safety: No firearms. Seat Belt Safety/Bike Helmet: Always wears seat belt.   Counseling:   Eye Exam- Dr. Charlene Brooke. Wearing glasses today. No issues reported.  Dental- Dr. Vanessa Kick as needed.  Female:   Pap- N/A        Mammo- Not on file. Ordered by PCP.       Dexa scan- Not on file. Pt thinks last about 2 years ago, reports it was normal.       CCS- last 02/21/10 in [place]. Pt states 5 year recall.     Objective:    Today's Vitals   02/03/16 0945 02/03/16 1040  BP: 118/78   Pulse: 66   Temp: 98.2 F (36.8 C)   TempSrc: Oral   SpO2: 97%   Weight: 103 lb (46.7 kg)   Height: 5\' 6"  (1.676 m)   PainSc:  3    Body mass index is 16.62 kg/m.   Current Medications (verified) Outpatient Encounter Prescriptions as of 02/03/2016  Medication Sig  . acetaminophen (TYLENOL) 500 MG tablet Take 500 mg by mouth at bedtime as needed for moderate pain.   Marland Kitchen aspirin EC 325 MG EC tablet Take 1 tablet (325 mg total) by mouth daily. Take Aspirin 325 mg daily for three weeks and then reduce back to the 81 mg Aspirin dosing at home.  . cetirizine (ZYRTEC) 10 MG tablet Take 10 mg by mouth every morning.   . DULoxetine (CYMBALTA) 20 MG capsule Take 1 capsule (20 mg total) by mouth daily. (Patient taking differently: Take 20 mg by mouth every morning. )  . furosemide (LASIX) 20 MG tablet Take 1 tablet (20  mg total) by mouth daily.  Marland Kitchen gabapentin (NEURONTIN) 300 MG capsule Take 1 capsule (300 mg total) by mouth at bedtime.  . iron polysaccharides (NIFEREX) 150 MG capsule Take 1 capsule (150 mg total) by mouth 2 (two) times daily.  Marland Kitchen levothyroxine (SYNTHROID, LEVOTHROID) 50 MCG tablet Take 1 tablet (50 mcg total) by mouth daily.  Marland Kitchen liothyronine (CYTOMEL) 25 MCG tablet Take 1 tablet (25 mcg total) by mouth daily.  . methocarbamol (ROBAXIN) 500 MG tablet Take 1 tablet (500 mg total) by mouth every 6 (six) hours as needed for muscle spasms.  Marland Kitchen oxyCODONE (OXY IR/ROXICODONE) 5 MG immediate release tablet Take 1-2 tablets (5-10 mg total) by mouth every 3 (three) hours as needed for moderate pain or severe pain.  . [DISCONTINUED] ciprofloxacin (CIPRO) 250 MG tablet Take 1 tablet (250 mg total) by mouth 2 (two) times daily. Take for 5 days.  . [DISCONTINUED] furosemide (LASIX) 20 MG tablet TAKE 1 TABLET DAILY  . [DISCONTINUED] gabapentin (NEURONTIN) 300 MG capsule Take 1 capsule (300 mg total) by mouth at bedtime.  . [DISCONTINUED] levothyroxine (SYNTHROID, LEVOTHROID) 50 MCG tablet Take 1 tablet (50 mcg total) by mouth daily. (Patient taking differently: Take 50 mcg by mouth daily before breakfast. )  . [  DISCONTINUED] liothyronine (CYTOMEL) 25 MCG tablet TAKE 1 TABLET DAILY  . DULoxetine (CYMBALTA) 30 MG capsule Take 1 capsule (30 mg total) by mouth daily.   No facility-administered encounter medications on file as of 02/03/2016.     Allergies (verified) Penicillins and Sulfa antibiotics   History: Past Medical History:  Diagnosis Date  . Abdominal pain 02/01/2015  . Allergy    "seasonal allergies"  . Arthritis    osteoarthritis-"DDD" hips. fingers, toes.  . Complication of anesthesia   . Depression   . Depression with anxiety   . Frequent headaches    no problems now.  Marland Kitchen Hearing loss in right ear 03/10/2015   same over last 6 months  . Hip dislocation, right ([place])   . History of chicken pox    . Hyperlipidemia   . Hypothyroidism    supplement used  . Peripheral neuropathy ([place]) 02/01/2015   right hand"carpal tunnel"  . PONV (postoperative nausea and vomiting)   . Right hip pain 05/24/2015  . Scoliosis 02/01/2015  . Thyroid disease   . URI, acute 03/10/2015  . 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 ACETABULAR REVISION Right 10/30/2015   Procedure: RIGHT HIP ACETABULAR REVISION /CONSTRAINED LINER (POSTERIOR APPROACH) ;  Surgeon: Gaynelle Arabian, MD;  Location: [facility];  Service: Orthopedics;  Laterality: Right;  . HIP CLOSED REDUCTION Right 06/30/2015   Procedure: CLOSED MANIPULATION HIP;  Surgeon: Melina Schools, MD;  Location: [facility];  Service: Orthopedics;  Laterality: Right;  . METACARPOPHALANGEAL JOINT CAPSULECTOMY / CAPSULOTOMY  1995  . TOTAL HIP ARTHROPLASTY  2011   left hip  . TOTAL HIP ARTHROPLASTY Right 06/12/2015   Procedure: RIGHT TOTAL HIP ARTHROPLASTY ANTERIOR APPROACH;  Surgeon: Gaynelle Arabian, MD;  Location: [facility];  Service: Orthopedics;  Laterality: Right;  . TOTAL HIP REVISION Right 07/19/2015   Procedure: RIGHT HIP BEARING SURFACE REVISION;  Surgeon: Gaynelle Arabian, MD;  Location: [facility];  Service: Orthopedics;  Laterality: Right;  . TUBAL LIGATION  1980   left fallopian tube removal   Family History  Problem Relation Age of Onset  . Arthritis Mother   . Dementia Mother   . Mental illness Father     suicide  . Arthritis Maternal Grandmother   . Kidney disease Son     b/l multicystic kidney disease s/p 4 transplants   Social History   Occupational History  . retired    Social History Main Topics  . Smoking status: Never Smoker  . Smokeless tobacco: Never Used  . Alcohol use 0.0 oz/week     Comment: rare wine  . Drug use: No  . 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 N N  Vision? N N  Difficulty concentrating or making decisions? N N  Walking or climbing stairs? N Y  Dressing or bathing? Y N  Doing errands, shopping? N N  Preparing Food and eating ? N -  Using the Toilet? N -  In the past six months, have you accidently leaked urine? N -  Do you have problems with loss of bowel control? N -  Managing your Medications? N -  Managing your Finances? N -  Housekeeping or managing your Housekeeping? N -  Some recent data might be hidden    Immunizations and Health Maintenance  There is no immunization history on file for this patient. Health Maintenance Due  Topic Date Due  . Hepatitis C Screening  [birth year]  . TETANUS/TDAP  04/29/1963  . MAMMOGRAM  04/28/1994  . ZOSTAVAX  04/28/2004  . DEXA SCAN  04/28/2009  . PNA vac Low Risk Adult (1 of 2 - PCV13) 04/28/2009    Patient Care Team: Mosie Lukes, MD as PCP - General (Family Medicine) Renie Ora, MD as Consulting Physician (Pain Medicine)  Indicate any recent Medical Services you may have received from other than [facility] providers in the past year (date may be approximate).     Assessment:   This is a routine wellness examination for [facility]. Physical assessment deferred to PCP.  Hearing/Vision screen  Hearing Screening   125Hz  250Hz  500Hz  1000Hz  2000Hz  3000Hz  4000Hz  6000Hz  8000Hz   Right ear:   Fail Fail Fail  Fail    Left ear:   Pass Pass Pass  Pass    Comments: Able to hear conversational tones w/o difficulty. Pt reports hearing loss in R ear. Seen by Dr. Janace Hoard (ENT) last year and had neg MRI brain. Pt would like referral to specialist for hearing eval.  Vision Screening Comments: No issues reported. Wearing glasses today. Follows w/ eye doctor regularly.   Dietary issues and exercise activities discussed: Current  Exercise Habits: Structured exercise class, Type of exercise: Other - see comments;walking (Physical Therapy), Time (Minutes): 45, Frequency (Times/Week): 2, Weekly Exercise (Minutes/Week): 90, Intensity: Moderate, Exercise limited by: orthopedic condition(s)   Diet (meal preparation, eat out, water intake, caffeinated beverages, dairy products, fruits and vegetables): in general, a "healthy" diet  , well balanced. Loves vegetables and fruit and has a garden at home. Able to prepare meals. Does not eat breakfast normally. Light lunch, main meal is dinner. Drinks water/sparkling water and 3 cups coffee daily.   Goals    . Continued improvement of R hip pain and mobility      Depression Screen PHQ 2/9 Scores 02/03/2016 03/05/2015  PHQ - 2 Score 1 0    Fall Risk Fall Risk  02/03/2016 03/05/2015  Falls in the past year? Yes No  Number falls in past yr: 2 or more -  Injury with Fall? Yes -  Risk for fall due to : Impaired balance/gait -    Cognitive Function: MMSE - Mini Mental State Exam 02/03/2016  Orientation to time 5  Orientation to Place 5  Registration 3  Attention/ Calculation 5  Recall 2  Language- name 2 objects 2  Language- repeat 1  Language- follow 3 step command 3  Language- read & follow direction 1  Write a sentence 1  Copy design 1  Total score 29        Screening Tests Health Maintenance  Topic Date Due  . Hepatitis C Screening  03/02/1946  . TETANUS/TDAP  04/29/1963  . MAMMOGRAM  04/28/1994  . ZOSTAVAX  04/28/2004  . DEXA SCAN  04/28/2009  . PNA vac Low Risk Adult (1 of 2 - PCV13) 04/28/2009  . INFLUENZA VACCINE  03/01/2017 (Originally 11/12/2015)  . COLONOSCOPY  02/22/2020      Plan:    Follow-up w/ Dr. Charlett Blake as scheduled.  Records release for [facility] in [place], [place] for past colonoscopy records.   DEXA w/ [facility] next year since pt is not sure of date of last  DEXA in 2015.  Declines advance directive packet. Info printed on  AVS.  Hearing loss discussed w/ Dr. Charlett Blake, audiology referral placed per PCP instructions.  During the course of the visit, Amarianna was educated and counseled about the following appropriate screening and preventive services:   Vaccines to include Pneumoccal, Influenza, Hepatitis B, Td, Zostavax, HCV  Cardiovascular disease screening  Colorectal cancer screening  Bone density screening  Diabetes screening  Glaucoma screening  Mammography/PAP  Nutrition counseling  Patient Instructions (the written plan) were given to the patient.    Dorrene German, RN   02/03/2016

## 2016-02-03 NOTE — Progress Notes (Signed)
Pre visit review using our clinic review tool, if applicable. No additional management support is needed unless otherwise documented below in the visit note. 

## 2016-02-04 ENCOUNTER — Other Ambulatory Visit: Payer: Self-pay | Admitting: Family Medicine

## 2016-02-04 DIAGNOSIS — E876 Hypokalemia: Secondary | ICD-10-CM

## 2016-02-04 DIAGNOSIS — R29898 Other symptoms and signs involving the musculoskeletal system: Secondary | ICD-10-CM | POA: Diagnosis not present

## 2016-02-04 NOTE — Addendum Note (Signed)
Addended by: Dorrene German on: 02/04/2016 08:00 AM   Modules accepted: Orders

## 2016-02-06 DIAGNOSIS — R29898 Other symptoms and signs involving the musculoskeletal system: Secondary | ICD-10-CM | POA: Diagnosis not present

## 2016-02-11 DIAGNOSIS — R29898 Other symptoms and signs involving the musculoskeletal system: Secondary | ICD-10-CM | POA: Diagnosis not present

## 2016-02-13 ENCOUNTER — Ambulatory Visit (HOSPITAL_BASED_OUTPATIENT_CLINIC_OR_DEPARTMENT_OTHER)
Admission: RE | Admit: 2016-02-13 | Discharge: 2016-02-13 | Disposition: A | Discharge status: Home / Self Care | Payer: Medicare Other | Source: Ambulatory Visit | Attending: Family Medicine | Admitting: Family Medicine

## 2016-02-13 DIAGNOSIS — Z1231 Encounter for screening mammogram for malignant neoplasm of breast: Secondary | ICD-10-CM | POA: Diagnosis not present

## 2016-02-13 DIAGNOSIS — R29898 Other symptoms and signs involving the musculoskeletal system: Secondary | ICD-10-CM | POA: Diagnosis not present

## 2016-02-13 DIAGNOSIS — Z1239 Encounter for other screening for malignant neoplasm of breast: Secondary | ICD-10-CM

## 2016-02-18 DIAGNOSIS — R29898 Other symptoms and signs involving the musculoskeletal system: Secondary | ICD-10-CM | POA: Diagnosis not present

## 2016-02-20 DIAGNOSIS — Z471 Aftercare following joint replacement surgery: Secondary | ICD-10-CM | POA: Diagnosis not present

## 2016-02-20 DIAGNOSIS — Z96641 Presence of right artificial hip joint: Secondary | ICD-10-CM | POA: Diagnosis not present

## 2016-02-20 DIAGNOSIS — Z96642 Presence of left artificial hip joint: Secondary | ICD-10-CM | POA: Diagnosis not present

## 2016-02-24 DIAGNOSIS — M47817 Spondylosis without myelopathy or radiculopathy, lumbosacral region: Secondary | ICD-10-CM | POA: Diagnosis not present

## 2016-02-24 DIAGNOSIS — M25559 Pain in unspecified hip: Secondary | ICD-10-CM | POA: Diagnosis not present

## 2016-02-24 DIAGNOSIS — M5137 Other intervertebral disc degeneration, lumbosacral region: Secondary | ICD-10-CM | POA: Diagnosis not present

## 2016-02-24 DIAGNOSIS — G894 Chronic pain syndrome: Secondary | ICD-10-CM | POA: Diagnosis not present

## 2016-02-25 DIAGNOSIS — R29898 Other symptoms and signs involving the musculoskeletal system: Secondary | ICD-10-CM | POA: Diagnosis not present

## 2016-02-27 DIAGNOSIS — R29898 Other symptoms and signs involving the musculoskeletal system: Secondary | ICD-10-CM | POA: Diagnosis not present

## 2016-03-02 ENCOUNTER — Other Ambulatory Visit (INDEPENDENT_AMBULATORY_CARE_PROVIDER_SITE_OTHER): Payer: Medicare Other

## 2016-03-02 DIAGNOSIS — E876 Hypokalemia: Secondary | ICD-10-CM

## 2016-03-02 LAB — COMPREHENSIVE METABOLIC PANEL
ALBUMIN: 4.3 g/dL (ref 3.5–5.2)
ALK PHOS: 69 U/L (ref 39–117)
ALT: 18 U/L (ref 0–35)
AST: 23 U/L (ref 0–37)
BILIRUBIN TOTAL: 0.5 mg/dL (ref 0.2–1.2)
BUN: 11 mg/dL (ref 6–23)
CALCIUM: 9.8 mg/dL (ref 8.4–10.5)
CO2: 32 mEq/L (ref 19–32)
CREATININE: 0.82 mg/dL (ref 0.40–1.20)
Chloride: 102 mEq/L (ref 96–112)
GFR: 72.87 mL/min (ref >60.00)
Glucose, Bld: 94 mg/dL (ref 70–99)
Potassium: 3.8 mEq/L (ref 3.5–5.1)
Sodium: 140 mEq/L (ref 135–145)
TOTAL PROTEIN: 6.7 g/dL (ref 6.0–8.3)

## 2016-03-03 DIAGNOSIS — R29898 Other symptoms and signs involving the musculoskeletal system: Secondary | ICD-10-CM | POA: Diagnosis not present

## 2016-03-10 DIAGNOSIS — R29898 Other symptoms and signs involving the musculoskeletal system: Secondary | ICD-10-CM | POA: Diagnosis not present

## 2016-03-12 DIAGNOSIS — R29898 Other symptoms and signs involving the musculoskeletal system: Secondary | ICD-10-CM | POA: Diagnosis not present

## 2016-03-16 ENCOUNTER — Ambulatory Visit: Payer: Medicare Other | Attending: Family Medicine | Admitting: Audiology

## 2016-03-16 DIAGNOSIS — H9192 Unspecified hearing loss, left ear: Secondary | ICD-10-CM

## 2016-03-16 DIAGNOSIS — H9191 Unspecified hearing loss, right ear: Secondary | ICD-10-CM | POA: Insufficient documentation

## 2016-03-16 DIAGNOSIS — H906 Mixed conductive and sensorineural hearing loss, bilateral: Secondary | ICD-10-CM | POA: Diagnosis not present

## 2016-03-16 DIAGNOSIS — R94128 Abnormal results of other function studies of ear and other special senses: Secondary | ICD-10-CM | POA: Diagnosis not present

## 2016-03-16 DIAGNOSIS — H748X3 Other specified disorders of middle ear and mastoid, bilateral: Secondary | ICD-10-CM | POA: Diagnosis not present

## 2016-03-16 DIAGNOSIS — Z01118 Encounter for examination of ears and hearing with other abnormal findings: Secondary | ICD-10-CM | POA: Diagnosis not present

## 2016-03-16 NOTE — Procedures (Signed)
Outpatient Audiology and [place]  [place], [place] [address]  [phone]   Audiological Evaluation  Patient Name: Misty Barker Status: Outpatient   DOB: 08-Aug-1945    Diagnosis: Hearing Loss          MRN: [id number] Date:  03/16/2016     Referent: Penni Homans, MD  History: Misty Barker was seen for an audiological evaluation to monitor a hearing loss that occurred last year over the course of a "few weeks".  Misty Barker states that she was seen by Dr. Janace Hoard, ENT and had both a hearing test and MRI to "rule out acoustic neuroma"-which was negative.  Misty Barker states that her physician order this test to "follow-up".  Misty Barker states that her hearing "seems the same as last year".    Primary Concern: Hearing loss poorer on the right side - "can't hear anything out of the right ear".  Difficulty hearing the TV. History of hearing problems: Y - gradual except for the right ear becoming poorer last year.  History of ear infections:   N  History of dizziness/vertigo/ balance issues:   N - except for that related to the hip replacement and current therapy for that.  Tinnitus:  Y occational "whooshing sound at times" - thinks is associated with "allergies". Sound sensitivity: N History of occupational noise exposure: N History of hypertension: N History of diabetes:  N Family history of hearing loss:  N Other:  Hypothyroid.   Evaluation: Conventional pure tone audiometry from 250Hz  - 8000Hz  with using insert earphones.  Hearing Thresholds: Right ear:  Thresholds of 55-75 dBHL.  Left ear:    Thresholds of 30-45 dBHL from 500Hz  - 4000Hz  and 55 dBHL at 8000Hz . Reliability is good Speech reception levels (repeating words near threshold) using recorded spondee word lists:  Right ear: 55 dBHL.  Left ear:  35 dBHL Word recognition (at comfortably loud volumes) using recorded NU-6 word lists  in quiet.  Right ear: 96% at 85 dBHL with 75 dBHL contralateral masking. Left ear:   96% at 75 dBHL. Tympanometry (middle ear function) has normal volume and pressure bilaterally.  Ipsilateral acoustic reflexes were not completed because a seal could not be maintained..  Right ear: Slightly shallow compliance (Type As).  Left ear: Very shallow compliance (Type As/Type B).  CONCLUSION:  Misty Barker has a moderately severe to severe hearing loss on the right and a mild sloping to moderate high frequency hearing loss on the left.  The hearing loss appears predominantly sensorineural with a slight  mixed bilaterally. Notable is that the eardrum (tympanic membrane movement) is shallow on the right with very poor movement on the left. As discussed with Misty Barker, her physician and/or Dr. Janace Hoard ENT were recommended to rule out allergies as a contributor to her hearing loss.   Misty Barker has excellent word recognition at loud levels on the left (very loud conversational speech levels) and very loud levels (equivalent to shouting within 1 foot) on the right.  As discussed with Misty Barker, this amount of hearing loss will adversely affect communication in all settings.  A hearing aid evaluation was strongly recommended but Misty Barker states that she was not interested at this time.  However, first, it is important that today's test results be compared with the audiogram completed at Dr. Redmond Baseman office last year to ensure hearing stability.    RECOMMENDATIONS: 1.   Compare current and  previous hearing test results to determine next recommendation.  Please have Dr. Janace Hoard and/or your physician compare these results since they were not available today. 2.   Continue to monitor hearing with a repeat audiological evaluation in 6-12 months (earlier if there is any change in hearing or ear pressure).   3.   A hearing aid evaluation to help hearing.   Amplification helps make the signal louder and therefore often improves hearing and word recognition.  Amplification has many forms including hearing aids in one or both ears, an assistive listening device which have a microphone and speaker such as a small handheld device and/or even a surround sound system of speakers.  Amplification may be covered by some insurances, but not all.  It is important to note that hearing aids must be individually fit according to the hearing test results and the ear shape.  Audiologists and hearing aid dealers in [place] must be licensed in order to dispense hearing aids.  There are many excellent choices when it comes to amplification in our area and providers are listed in the phone book under hearing aids, may be affiliated with Ear, [place] and Throat physicians, are located at [place] as well as the [facility]. 4.  Strategies that help improve hearing include: A) Face the speaker directly. Optimal is having the speakers face well - lit.  Unless amplified, being within 3-6 feet of the speaker will enhance word recognition. B) Avoid having the speaker back-lit as this will minimize the ability to use cues from lip-reading, facial expression and gestures. C)  Word recognition is poorer in background noise. For optimal word recognition, turn off the TV, radio or noisy fan when engaging in conversation. In a restaurant, try to sit away from noise sources and close to the primary speaker.  D)  Ask for topic clarification from time to time in order to remain in the conversation.  Most people don't mind repeating or clarifying a point when asked.  If needed, explain the difficulty hearing in background noise or hearing loss. 5.   [place] hearing protection during noisy activities such as using a weed eater, moving the lawn, shooting, etc.    Musician's plugs, are available from [employer] for music related hearing protection because there  is no distortion.  Other hearing protection, such as sponge plugs (available at pharmacies) or earmuffs (available at sporting goods stores or department stores such as Paediatric nurse) are useful for noisy activities and venues.  Kabao Leite L. Heide Spark, Au.D., CCC-A Doctor of Audiology 03/16/2016

## 2016-03-17 DIAGNOSIS — R29898 Other symptoms and signs involving the musculoskeletal system: Secondary | ICD-10-CM | POA: Diagnosis not present

## 2016-03-19 DIAGNOSIS — R29898 Other symptoms and signs involving the musculoskeletal system: Secondary | ICD-10-CM | POA: Diagnosis not present

## 2016-03-24 DIAGNOSIS — R29898 Other symptoms and signs involving the musculoskeletal system: Secondary | ICD-10-CM | POA: Diagnosis not present

## 2016-03-25 DIAGNOSIS — M47817 Spondylosis without myelopathy or radiculopathy, lumbosacral region: Secondary | ICD-10-CM | POA: Diagnosis not present

## 2016-03-25 DIAGNOSIS — Z79899 Other long term (current) drug therapy: Secondary | ICD-10-CM | POA: Diagnosis not present

## 2016-03-25 DIAGNOSIS — G894 Chronic pain syndrome: Secondary | ICD-10-CM | POA: Diagnosis not present

## 2016-03-25 DIAGNOSIS — M5137 Other intervertebral disc degeneration, lumbosacral region: Secondary | ICD-10-CM | POA: Diagnosis not present

## 2016-03-25 DIAGNOSIS — Z79891 Long term (current) use of opiate analgesic: Secondary | ICD-10-CM | POA: Diagnosis not present

## 2016-03-25 DIAGNOSIS — M25559 Pain in unspecified hip: Secondary | ICD-10-CM | POA: Diagnosis not present

## 2016-03-26 DIAGNOSIS — Z96649 Presence of unspecified artificial hip joint: Secondary | ICD-10-CM | POA: Diagnosis not present

## 2016-03-26 DIAGNOSIS — Z96641 Presence of right artificial hip joint: Secondary | ICD-10-CM | POA: Diagnosis not present

## 2016-03-26 DIAGNOSIS — Z471 Aftercare following joint replacement surgery: Secondary | ICD-10-CM | POA: Diagnosis not present

## 2016-03-30 DIAGNOSIS — R29898 Other symptoms and signs involving the musculoskeletal system: Secondary | ICD-10-CM | POA: Diagnosis not present

## 2016-04-01 DIAGNOSIS — R29898 Other symptoms and signs involving the musculoskeletal system: Secondary | ICD-10-CM | POA: Diagnosis not present

## 2016-04-08 DIAGNOSIS — R29898 Other symptoms and signs involving the musculoskeletal system: Secondary | ICD-10-CM | POA: Diagnosis not present

## 2016-04-14 DIAGNOSIS — R29898 Other symptoms and signs involving the musculoskeletal system: Secondary | ICD-10-CM | POA: Diagnosis not present

## 2016-04-16 DIAGNOSIS — R29898 Other symptoms and signs involving the musculoskeletal system: Secondary | ICD-10-CM | POA: Diagnosis not present

## 2016-04-21 DIAGNOSIS — R29898 Other symptoms and signs involving the musculoskeletal system: Secondary | ICD-10-CM | POA: Diagnosis not present

## 2016-04-23 DIAGNOSIS — R29898 Other symptoms and signs involving the musculoskeletal system: Secondary | ICD-10-CM | POA: Diagnosis not present

## 2016-04-28 DIAGNOSIS — R29898 Other symptoms and signs involving the musculoskeletal system: Secondary | ICD-10-CM | POA: Diagnosis not present

## 2016-05-07 ENCOUNTER — Encounter: Payer: Self-pay | Admitting: Family Medicine

## 2016-05-08 DIAGNOSIS — Z471 Aftercare following joint replacement surgery: Secondary | ICD-10-CM | POA: Diagnosis not present

## 2016-05-08 DIAGNOSIS — Z96641 Presence of right artificial hip joint: Secondary | ICD-10-CM | POA: Diagnosis not present

## 2016-05-11 ENCOUNTER — Telehealth: Payer: Self-pay | Admitting: Family Medicine

## 2016-05-11 DIAGNOSIS — H903 Sensorineural hearing loss, bilateral: Secondary | ICD-10-CM | POA: Diagnosis not present

## 2016-05-11 DIAGNOSIS — H6983 Other specified disorders of Eustachian tube, bilateral: Secondary | ICD-10-CM | POA: Diagnosis not present

## 2016-05-11 DIAGNOSIS — H900 Conductive hearing loss, bilateral: Secondary | ICD-10-CM | POA: Diagnosis not present

## 2016-05-11 DIAGNOSIS — J324 Chronic pansinusitis: Secondary | ICD-10-CM | POA: Diagnosis not present

## 2016-05-11 NOTE — Telephone Encounter (Signed)
Rec'd from [facility] forward 4 pages to Dr. Charlett Blake

## 2016-05-12 ENCOUNTER — Ambulatory Visit: Payer: Medicare Other | Admitting: Family Medicine

## 2016-05-18 DIAGNOSIS — G8929 Other chronic pain: Secondary | ICD-10-CM | POA: Diagnosis not present

## 2016-05-18 DIAGNOSIS — M545 Low back pain: Secondary | ICD-10-CM | POA: Diagnosis not present

## 2016-05-19 DIAGNOSIS — H6981 Other specified disorders of Eustachian tube, right ear: Secondary | ICD-10-CM | POA: Diagnosis not present

## 2016-05-20 DIAGNOSIS — M47816 Spondylosis without myelopathy or radiculopathy, lumbar region: Secondary | ICD-10-CM | POA: Diagnosis not present

## 2016-05-20 DIAGNOSIS — Z79899 Other long term (current) drug therapy: Secondary | ICD-10-CM | POA: Diagnosis not present

## 2016-05-20 DIAGNOSIS — Z79891 Long term (current) use of opiate analgesic: Secondary | ICD-10-CM | POA: Diagnosis not present

## 2016-05-20 DIAGNOSIS — G894 Chronic pain syndrome: Secondary | ICD-10-CM | POA: Diagnosis not present

## 2016-05-20 DIAGNOSIS — M5136 Other intervertebral disc degeneration, lumbar region: Secondary | ICD-10-CM | POA: Diagnosis not present

## 2016-05-20 DIAGNOSIS — M25559 Pain in unspecified hip: Secondary | ICD-10-CM | POA: Diagnosis not present

## 2016-05-25 NOTE — Telephone Encounter (Signed)
I have no records.  Advise please.

## 2016-05-25 NOTE — Telephone Encounter (Signed)
Called medical records to inform PCP does not have them. Will call back after 4 .

## 2016-05-25 NOTE — Telephone Encounter (Signed)
Medical records called Blondell Reveal) from [phone], wanting to confirm if provider received 4 pages of medical records of this pt. Please call Blondell Reveal at the number mentioned above to confirm that documents were received. Please advised ASAP.

## 2016-05-26 ENCOUNTER — Telehealth: Payer: Self-pay | Admitting: Gastroenterology

## 2016-05-26 ENCOUNTER — Encounter: Payer: Self-pay | Admitting: Family Medicine

## 2016-05-26 NOTE — Telephone Encounter (Signed)
Medical records called back again today to inquire if records have been received.  Let me know if you come across them, I have no records on my desk.

## 2016-05-26 NOTE — Telephone Encounter (Signed)
Misty Barker from medical records would like to speak with you regarding medical records, please advise [phone]

## 2016-05-26 NOTE — Telephone Encounter (Signed)
Called medical records informed of all PCP instructions.

## 2016-05-26 NOTE — Telephone Encounter (Signed)
I have no records on me and do not recall seeing any pages specifically on this patient but I see hundreds of pages of records on patients daily and initial them and send them on to MR if they do not ask for a signiture so cannot say with certainty they did not come through yet. If they have something specific they need me to look at and act on they will have to send it again with a flag on it.

## 2016-05-26 NOTE — Telephone Encounter (Signed)
Received colon and path report and placed on Dr. Doyne Keel desk for review. Dr. Havery Moros is Doc of the Day for 02/02/17.

## 2016-05-27 DIAGNOSIS — M545 Low back pain: Secondary | ICD-10-CM | POA: Diagnosis not present

## 2016-05-27 DIAGNOSIS — G8929 Other chronic pain: Secondary | ICD-10-CM | POA: Diagnosis not present

## 2016-05-29 ENCOUNTER — Ambulatory Visit (INDEPENDENT_AMBULATORY_CARE_PROVIDER_SITE_OTHER): Payer: Medicare Other | Admitting: Family Medicine

## 2016-05-29 DIAGNOSIS — S73004A Unspecified dislocation of right hip, initial encounter: Secondary | ICD-10-CM

## 2016-05-29 DIAGNOSIS — E079 Disorder of thyroid, unspecified: Secondary | ICD-10-CM

## 2016-05-29 DIAGNOSIS — M419 Scoliosis, unspecified: Secondary | ICD-10-CM

## 2016-05-29 DIAGNOSIS — Z96649 Presence of unspecified artificial hip joint: Secondary | ICD-10-CM

## 2016-05-29 DIAGNOSIS — E785 Hyperlipidemia, unspecified: Secondary | ICD-10-CM

## 2016-05-29 DIAGNOSIS — T84028S Dislocation of other internal joint prosthesis, sequela: Secondary | ICD-10-CM

## 2016-05-29 NOTE — Assessment & Plan Note (Signed)
Has just finished PT s/p THR with Dr Maureen Ralphs, has plateaued.

## 2016-05-29 NOTE — Progress Notes (Signed)
Pre visit review using our clinic review tool, if applicable. No additional management support is needed unless otherwise documented below in the visit note. 

## 2016-05-29 NOTE — Progress Notes (Signed)
[name], female   DOB: [birth year], 71 y.o.   MRN: [id number]   Subjective:    [name], female    DOB: March 21, 1946, 71 y.o.   MRN: [id number]  Chief Complaint  Patient presents with  . Follow-up   I acted as a Education administrator for Dr. Charlett Blake. Princess, RMA  HPI  Patient is in today for a follow up. Patient has no acute concerns, her low back and hip concerns persist. She does feel her back pain is worsening but she denies any falls or trauma. No new incontinence. Denies CP/palp/SOB/HA/congestion/fevers/GI or GU c/o. Taking meds as prescribed  Past Medical History:  Diagnosis Date  . Abdominal pain 02/01/2015  . Allergy    "seasonal allergies"  . Arthritis    osteoarthritis-"DDD" hips. fingers, toes.  . Complication of anesthesia   . Depression   . Depression with anxiety   . Frequent headaches    no problems now.  Marland Kitchen Hearing loss in right ear 03/10/2015   same over last 6 months  . Hip dislocation, right ([facility])   . History of chicken pox   . Hyperlipidemia   . Hypothyroidism    supplement used  . Peripheral neuropathy ([place]) 02/01/2015   right hand"carpal tunnel"  . PONV (postoperative nausea and vomiting)   . Right hip pain 05/24/2015  . Scoliosis 02/01/2015  . Thyroid disease   . URI, acute 03/10/2015  . 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 ACETABULAR REVISION Right 10/30/2015   Procedure: RIGHT HIP ACETABULAR REVISION /CONSTRAINED LINER (POSTERIOR APPROACH) ;  Surgeon: Gaynelle Arabian, MD;  Location: [facility];  Service: Orthopedics;  Laterality: Right;  . HIP CLOSED REDUCTION Right 06/30/2015   Procedure: CLOSED MANIPULATION HIP;  Surgeon: Melina Schools, MD;  Location: [facility];  Service: Orthopedics;  Laterality: Right;  . METACARPOPHALANGEAL JOINT CAPSULECTOMY / CAPSULOTOMY  1995  . TOTAL HIP ARTHROPLASTY  2011   left hip  . TOTAL HIP ARTHROPLASTY Right 06/12/2015    Procedure: RIGHT TOTAL HIP ARTHROPLASTY ANTERIOR APPROACH;  Surgeon: Gaynelle Arabian, MD;  Location: [facility];  Service: Orthopedics;  Laterality: Right;  . TOTAL HIP REVISION Right 07/19/2015   Procedure: RIGHT HIP BEARING SURFACE REVISION;  Surgeon: Gaynelle Arabian, MD;  Location: [facility];  Service: Orthopedics;  Laterality: Right;  . TUBAL LIGATION  1980   left fallopian tube removal    Family History  Problem Relation Age of Onset  . Arthritis Mother   . Dementia Mother   . Mental illness Father     suicide  . Arthritis Maternal Grandmother   . Kidney disease Son     b/l multicystic kidney disease s/p 4 transplants    Social History   Social History  . Marital status: Married    Spouse name: N/A  . Number of children: N/A  . Years of education: 53   Occupational History  . retired    Social History Main Topics  . Smoking status: Never Smoker  . Smokeless tobacco: Never Used  . Alcohol use 0.0 oz/week     Comment: rare wine  . Drug use: No  . 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 Not on file   Social History Narrative  . No narrative on file    Outpatient  Medications Prior to Visit  Medication Sig Dispense Refill  . acetaminophen (TYLENOL) 500 MG tablet Take 500 mg by mouth at bedtime as needed for moderate pain.     Marland Kitchen aspirin EC 325 MG EC tablet Take 1 tablet (325 mg total) by mouth daily. Take Aspirin 325 mg daily for three weeks and then reduce back to the 81 mg Aspirin dosing at home. 21 tablet 0  . cetirizine (ZYRTEC) 10 MG tablet Take 10 mg by mouth every morning.     . DULoxetine (CYMBALTA) 20 MG capsule Take 1 capsule (20 mg total) by mouth daily. (Patient taking differently: Take 20 mg by mouth every morning. ) 30 capsule 0  . DULoxetine (CYMBALTA) 30 MG capsule Take 1 capsule (30 mg total) by mouth daily. 90 capsule 1  . furosemide (LASIX) 20 MG tablet Take  1 tablet (20 mg total) by mouth daily. 90 tablet 3  . gabapentin (NEURONTIN) 300 MG capsule Take 1 capsule (300 mg total) by mouth at bedtime. 90 capsule 3  . iron polysaccharides (NIFEREX) 150 MG capsule Take 1 capsule (150 mg total) by mouth 2 (two) times daily. 42 capsule 0  . levothyroxine (SYNTHROID, LEVOTHROID) 50 MCG tablet Take 1 tablet (50 mcg total) by mouth daily. 90 tablet 3  . liothyronine (CYTOMEL) 25 MCG tablet Take 1 tablet (25 mcg total) by mouth daily. 90 tablet 3  . methocarbamol (ROBAXIN) 500 MG tablet Take 1 tablet (500 mg total) by mouth every 6 (six) hours as needed for muscle spasms. 80 tablet 0  . oxyCODONE (OXY IR/ROXICODONE) 5 MG immediate release tablet Take 1-2 tablets (5-10 mg total) by mouth every 3 (three) hours as needed for moderate pain or severe pain. 80 tablet 0   No facility-administered medications prior to visit.     Allergies  Allergen Reactions  . Penicillins Hives    Has patient had a PCN reaction causing immediate rash, facial/tongue/throat swelling, SOB or lightheadedness with hypotension: No Has patient had a PCN reaction causing severe rash involving mucus membranes or skin necrosis: No Has patient had a PCN reaction that required hospitalization No Has patient had a PCN reaction occurring within the last 10 years: No If all of the above answers are "NO", then may proceed with Cephalosporin use.  . Sulfa Antibiotics Hives    Review of Systems  Constitutional: Negative for fever and malaise/fatigue.  HENT: Negative for congestion.   Eyes: Negative for blurred vision.  Respiratory: Negative for shortness of breath.   Cardiovascular: Negative for chest pain, palpitations and leg swelling.  Gastrointestinal: Negative for abdominal pain, blood in stool and nausea.  Genitourinary: Negative for dysuria and frequency.  Musculoskeletal: Positive for back pain and joint pain. Negative for falls.  Skin: Negative for rash.  Neurological: Negative for  dizziness, loss of consciousness and headaches.  Endo/Heme/Allergies: Negative for environmental allergies.  Psychiatric/Behavioral: Negative for depression. The patient is not nervous/anxious.        Objective:    Physical Exam  Constitutional: She is oriented to person, place, and time. She appears well-developed and well-nourished. No distress.  HENT:  Head: Normocephalic and atraumatic.  Nose: Nose normal.  Eyes: Right eye exhibits no discharge. Left eye exhibits no discharge.  Neck: Normal range of motion. Neck supple.  Cardiovascular: Normal rate and regular rhythm.   No murmur heard. Pulmonary/Chest: Effort normal and breath sounds normal.  Abdominal: Soft. Bowel sounds are normal. There is no tenderness.  Musculoskeletal: She exhibits no edema.  Neurological: She is alert and oriented to person, place, and time.  Skin: Skin is warm and dry.  Psychiatric: She has a normal mood and affect.  Nursing note and vitals reviewed.   BP 118/62 (BP Location: Left Arm, Patient Position: Sitting, Cuff Size: Normal)   Pulse 64   Temp 98.2 F (36.8 C) (Oral)   Wt 105 lb 9.6 oz (47.9 kg)   SpO2 99%   BMI 17.04 kg/m  Wt Readings from Last 3 Encounters:  05/29/16 105 lb 9.6 oz (47.9 kg)  02/03/16 103 lb (46.7 kg)  10/30/15 115 lb 8.3 oz (52.4 kg)     Lab Results  Component Value Date   WBC 7.4 02/03/2016   HGB 13.7 02/03/2016   HCT 42.4 02/03/2016   PLT 276.0 02/03/2016   GLUCOSE 94 03/02/2016   CHOL 241 (H) 02/03/2016   TRIG 91.0 02/03/2016   HDL 83.10 02/03/2016   LDLCALC 140 (H) 02/03/2016   ALT 18 03/02/2016   AST 23 03/02/2016   NA 140 03/02/2016   K 3.8 03/02/2016   CL 102 03/02/2016   CREATININE 0.82 03/02/2016   BUN 11 03/02/2016   CO2 32 03/02/2016   TSH 0.05 (L) 02/03/2016   INR 0.95 10/29/2015    Lab Results  Component Value Date   TSH 0.05 (L) 02/03/2016   Lab Results  Component Value Date   WBC 7.4 02/03/2016   HGB 13.7 02/03/2016   HCT 42.4  02/03/2016   MCV 81.4 02/03/2016   PLT 276.0 02/03/2016   Lab Results  Component Value Date   NA 140 03/02/2016   K 3.8 03/02/2016   CO2 32 03/02/2016   GLUCOSE 94 03/02/2016   BUN 11 03/02/2016   CREATININE 0.82 03/02/2016   BILITOT 0.5 03/02/2016   ALKPHOS 69 03/02/2016   AST 23 03/02/2016   ALT 18 03/02/2016   PROT 6.7 03/02/2016   ALBUMIN 4.3 03/02/2016   CALCIUM 9.8 03/02/2016   ANIONGAP 4 (L) 11/02/2015   GFR 72.87 03/02/2016   Lab Results  Component Value Date   CHOL 241 (H) 02/03/2016   Lab Results  Component Value Date   HDL 83.10 02/03/2016   Lab Results  Component Value Date   LDLCALC 140 (H) 02/03/2016   Lab Results  Component Value Date   TRIG 91.0 02/03/2016   Lab Results  Component Value Date   CHOLHDL 3 02/03/2016   No results found for: HGBA1C     Assessment & Plan:   Problem List Items Addressed This Visit    Hyperlipidemia    Tolerating statin, encouraged heart healthy diet, avoid trans fats, minimize simple carbs and saturated fats. Increase exercise as tolerated      Thyroid disease    On Levothyroxine, continue to monitor      Scoliosis    Worsening low back pain, denies any trauma or recent fall but pain is daily. No incontinence or other new complaint. Encouraged Tylenol and topical treatments as first line therapy. Encouraged moist heat and gentle stretching as tolerated. 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          I am having Ms. Vogt-Nichols maintain her DULoxetine, cetirizine, acetaminophen, aspirin, iron polysaccharides, methocarbamol, oxyCODONE, liothyronine, levothyroxine, gabapentin,  furosemide, and DULoxetine.  No orders of the defined types were placed in this encounter.   CMA served as  Education administrator during this visit. History, Physical and Plan performed by medical provider. Documentation and orders reviewed and attested to.  Penni Homans, MD

## 2016-05-29 NOTE — Assessment & Plan Note (Addendum)
Worsening low back pain, denies any trauma or recent fall but pain is daily. No incontinence or other new complaint. Encouraged Tylenol and topical treatments as first line therapy. Encouraged moist heat and gentle stretching as tolerated. May try NSAIDs and prescription meds as directed and report if symptoms worsen or seek immediate care

## 2016-05-29 NOTE — Patient Instructions (Signed)

## 2016-05-29 NOTE — Assessment & Plan Note (Deleted)
Has just finished PT s/p THR with Dr Maureen Ralphs, has plateaued.

## 2016-06-01 NOTE — Assessment & Plan Note (Signed)
Tolerating statin, encouraged heart healthy diet, avoid trans fats, minimize simple carbs and saturated fats. Increase exercise as tolerated 

## 2016-06-01 NOTE — Assessment & Plan Note (Signed)
On Levothyroxine, continue to monitor 

## 2016-06-02 DIAGNOSIS — G8929 Other chronic pain: Secondary | ICD-10-CM | POA: Diagnosis not present

## 2016-06-02 DIAGNOSIS — M545 Low back pain: Secondary | ICD-10-CM | POA: Diagnosis not present

## 2016-06-09 NOTE — Telephone Encounter (Signed)
She had a colonoscopy in 2011 in which one adenoma was removed. She is due for a surveillance colonoscopy at this time if you wish to schedule. Thanks

## 2016-06-09 NOTE — Telephone Encounter (Signed)
Patient wanting to know if these records have been reviewed yet. And you please check this status for me.

## 2016-06-09 NOTE — Telephone Encounter (Signed)
Patient is advised and scheduled for her pre-visit and procedure

## 2016-06-09 NOTE — Telephone Encounter (Signed)
Please advise if these records have been reviewed. Thank you.

## 2016-06-12 DIAGNOSIS — Z79891 Long term (current) use of opiate analgesic: Secondary | ICD-10-CM | POA: Diagnosis not present

## 2016-06-12 DIAGNOSIS — M48062 Spinal stenosis, lumbar region with neurogenic claudication: Secondary | ICD-10-CM | POA: Diagnosis not present

## 2016-06-12 DIAGNOSIS — G894 Chronic pain syndrome: Secondary | ICD-10-CM | POA: Diagnosis not present

## 2016-06-17 DIAGNOSIS — H6983 Other specified disorders of Eustachian tube, bilateral: Secondary | ICD-10-CM | POA: Diagnosis not present

## 2016-06-17 DIAGNOSIS — H903 Sensorineural hearing loss, bilateral: Secondary | ICD-10-CM | POA: Diagnosis not present

## 2016-07-07 ENCOUNTER — Telehealth: Payer: Self-pay

## 2016-07-07 ENCOUNTER — Ambulatory Visit (AMBULATORY_SURGERY_CENTER): Payer: Self-pay

## 2016-07-07 VITALS — Ht 63.5 in | Wt 106.6 lb

## 2016-07-07 DIAGNOSIS — Z860100 Personal history of colon polyps, unspecified: Secondary | ICD-10-CM

## 2016-07-07 MED ORDER — METOCLOPRAMIDE HCL 5 MG PO TABS: 10.0000 mg | ORAL_TABLET | Freq: Two times a day (BID) | ORAL | Status: AC

## 2016-07-07 MED ORDER — SUPREP BOWEL PREP KIT 17.5-3.13-1.6 GM/177ML PO SOLN: 1.0000 | Freq: Once | ORAL | 0 refills | Status: AC

## 2016-07-07 MED ORDER — ONDANSETRON HCL 4 MG PO TABS: 4.0000 mg | ORAL_TABLET | Freq: Three times a day (TID) | ORAL | 1 refills | Status: DC | PRN

## 2016-07-07 NOTE — Telephone Encounter (Signed)
Dr Havery Moros,      Pt very concerned she will have N/V with prep.  May I please order her 2 Reglan pills to take 30 minutes before each round of Suprep? Thank you, Gem Conkle/PV

## 2016-07-07 NOTE — Telephone Encounter (Signed)
Yes that's fine. We can use Zofran as well if needed.

## 2016-07-07 NOTE — Progress Notes (Signed)
No allergies to eggs or soy No past problems with anesthesia except remote hx PONV with general anesthesia No diet meds No home oxygen  Declined emmi

## 2016-07-15 DIAGNOSIS — M47816 Spondylosis without myelopathy or radiculopathy, lumbar region: Secondary | ICD-10-CM | POA: Diagnosis not present

## 2016-07-15 DIAGNOSIS — M549 Dorsalgia, unspecified: Secondary | ICD-10-CM | POA: Diagnosis not present

## 2016-07-15 DIAGNOSIS — M5136 Other intervertebral disc degeneration, lumbar region: Secondary | ICD-10-CM | POA: Diagnosis not present

## 2016-07-15 DIAGNOSIS — G894 Chronic pain syndrome: Secondary | ICD-10-CM | POA: Diagnosis not present

## 2016-07-15 DIAGNOSIS — Z79899 Other long term (current) drug therapy: Secondary | ICD-10-CM | POA: Diagnosis not present

## 2016-07-15 DIAGNOSIS — Z79891 Long term (current) use of opiate analgesic: Secondary | ICD-10-CM | POA: Diagnosis not present

## 2016-07-21 ENCOUNTER — Encounter: Payer: Self-pay | Admitting: Gastroenterology

## 2016-07-21 ENCOUNTER — Ambulatory Visit (AMBULATORY_SURGERY_CENTER): Payer: Medicare Other | Admitting: Gastroenterology

## 2016-07-21 VITALS — BP 122/54 | HR 70 | Temp 97.3°F | Resp 15 | Ht 66.0 in | Wt 105.0 lb

## 2016-07-21 DIAGNOSIS — Z86010 Personal history of colonic polyps: Secondary | ICD-10-CM | POA: Diagnosis not present

## 2016-07-21 DIAGNOSIS — Z1211 Encounter for screening for malignant neoplasm of colon: Secondary | ICD-10-CM | POA: Diagnosis not present

## 2016-07-21 MED ORDER — SODIUM CHLORIDE 0.9 % IV SOLN: 500.0000 mL | INTRAVENOUS | Status: DC

## 2016-07-21 NOTE — Patient Instructions (Signed)
Impression/recommendations:  Diverticulosis (handout given) High Fiber Diet (handout given) Hemorrhoids (handout given)  YOU HAD AN ENDOSCOPIC PROCEDURE TODAY AT THE [facility]:   Refer to the procedure report that was given to you for any specific questions about what was found during the examination.  If the procedure report does not answer your questions, please call your gastroenterologist to clarify.  If you requested that your care partner not be given the details of your procedure findings, then the procedure report has been included in a sealed envelope for you to review at your convenience later.  YOU SHOULD EXPECT: Some feelings of bloating in the abdomen. Passage of more gas than usual.  Walking can help get rid of the air that was put into your GI tract during the procedure and reduce the bloating. If you had a lower endoscopy (such as a colonoscopy or flexible sigmoidoscopy) you may notice spotting of blood in your stool or on the toilet paper. If you underwent a bowel prep for your procedure, you may not have a normal bowel movement for a few days.  Please Note:  You might notice some irritation and congestion in your nose or some drainage.  This is from the oxygen used during your procedure.  There is no need for concern and it should clear up in a day or so.  SYMPTOMS TO REPORT IMMEDIATELY:   Following lower endoscopy (colonoscopy or flexible sigmoidoscopy):  Excessive amounts of blood in the stool  Significant tenderness or worsening of abdominal pains  Swelling of the abdomen that is new, acute  Fever of 100F or higher  For urgent or emergent issues, a gastroenterologist can be reached at any hour by calling [phone].   DIET:  We do recommend a small meal at first, but then you may proceed to your regular diet.  Drink plenty of fluids but you should avoid alcoholic beverages for 24 hours.  ACTIVITY:  You should plan to take it easy for the rest of  today and you should NOT DRIVE or use heavy machinery until tomorrow (because of the sedation medicines used during the test).    FOLLOW UP: Our staff will call the number listed on your records the next business day following your procedure to check on you and address any questions or concerns that you may have regarding the information given to you following your procedure. If we do not reach you, we will leave a message.  However, if you are feeling well and you are not experiencing any problems, there is no need to return our call.  We will assume that you have returned to your regular daily activities without incident.  If any biopsies were taken you will be contacted by phone or by letter within the next 1-3 weeks.  Please call us at [phone] if you have not heard about the biopsies in 3 weeks.    SIGNATURES/CONFIDENTIALITY: You and/or your care partner have signed paperwork which will be entered into your electronic medical record.  These signatures attest to the fact that that the information above on your After Visit Summary has been reviewed and is understood.  Full responsibility of the confidentiality of this discharge information lies with you and/or your care-partner. 

## 2016-07-21 NOTE — Op Note (Signed)
[facility] Patient Name: Misty Barker Procedure Date: 07/21/2016 1:15 PM MRN: [id number] Endoscopist: Remo Lipps P. Winslow Verrill MD, MD Age: 71 Referring MD:  Date of Birth: 13-Apr-1946 Gender: Female Account #: [id number] Procedure:                Colonoscopy Indications:              Surveillance: Personal history of one small adenoma                            on last colonoscopy > 5 years ago Medicines:                Monitored Anesthesia Care Procedure:                Pre-Anesthesia Assessment:                           - Prior to the procedure, a History and Physical                            was performed, and patient medications and                            allergies were reviewed. The patient's tolerance of                            previous anesthesia was also reviewed. The risks                            and benefits of the procedure and the sedation                            options and risks were discussed with the patient.                            All questions were answered, and informed consent                            was obtained. Prior Anticoagulants: The patient has                            taken aspirin, last dose was 1 day prior to                            procedure. ASA Grade Assessment: II - A patient                            with mild systemic disease. After reviewing the                            risks and benefits, the patient was deemed in                            satisfactory condition to undergo the procedure.  After obtaining informed consent, the colonoscope                            was passed under direct vision. Throughout the                            procedure, the patient's blood pressure, pulse, and                            oxygen saturations were monitored continuously. The                            Colonoscope was introduced through the anus and                            advanced  to the the cecum, identified by                            appendiceal orifice and ileocecal valve. The                            patient tolerated the procedure well. The                            colonoscopy was technically difficult and complex                            due to significant looping. The quality of the                            bowel preparation was good. The terminal ileum,                            ileocecal valve, appendiceal orifice, and rectum                            were photographed. Scope In: 1:32:15 PM Scope Out: 1:54:07 PM Scope Withdrawal Time: 0 hours 16 minutes 40 seconds  Total Procedure Duration: 0 hours 21 minutes 52 seconds  Findings:                 The perianal and digital rectal examinations were                            normal.                           Many small and large-mouthed diverticula were found                            in the entire colon, highest burden in the left                            colon which took time to navigate with angulated  turns.                           Internal hemorrhoids were found during retroflexion.                           The colon was extremely tortous. The exam was                            otherwise without abnormality. Complications:            No immediate complications. Estimated blood loss:                            None. Estimated Blood Loss:     Estimated blood loss: none. Impression:               - Diverticulosis in the entire examined colon,                            severe in the left colon.                           - Internal hemorrhoids.                           - Tortous colon                           - The examination was otherwise normal. No polyps Recommendation:           - Patient has a contact number available for                            emergencies. The signs and symptoms of potential                            delayed complications were  discussed with the                            patient. Return to normal activities tomorrow.                            Written discharge instructions were provided to the                            patient.                           - Resume previous diet.                           - Continue present medications.                           - No repeat colonoscopy for screening due to age  and the absence of colonic polyps on this exam                            (next exam would not be due for 10 years, at which                            time the patient will be 71 years old) Remo Lipps P. Britainy Kozub MD, MD 07/21/2016 2:00:39 PM This report has been signed electronically.

## 2016-07-21 NOTE — Progress Notes (Signed)
A/ox3 pleased with MAC, report to [facility]

## 2016-07-22 ENCOUNTER — Telehealth: Payer: Self-pay

## 2016-07-22 NOTE — Telephone Encounter (Signed)
  Follow up Call-  Call back number 07/21/2016  Post procedure Call Back phone  # [phone]  Permission to leave phone message Yes  Some recent data might be hidden     Patient questions:  Do you have a fever, pain , or abdominal swelling? No. Pain Score  0 *  Have you tolerated food without any problems? Yes.    Have you been able to return to your normal activities? Yes.    Do you have any questions about your discharge instructions: Diet   No. Medications  No. Follow up visit  No.  Do you have questions or concerns about your Care? No.  Actions: * If pain score is 4 or above: No action needed, pain <4.

## 2016-08-06 DIAGNOSIS — Z96641 Presence of right artificial hip joint: Secondary | ICD-10-CM | POA: Diagnosis not present

## 2016-08-06 DIAGNOSIS — Z4789 Encounter for other orthopedic aftercare: Secondary | ICD-10-CM | POA: Diagnosis not present

## 2016-08-06 DIAGNOSIS — Z471 Aftercare following joint replacement surgery: Secondary | ICD-10-CM | POA: Diagnosis not present

## 2016-08-26 DIAGNOSIS — F4542 Pain disorder with related psychological factors: Secondary | ICD-10-CM | POA: Diagnosis not present

## 2016-08-26 DIAGNOSIS — M48062 Spinal stenosis, lumbar region with neurogenic claudication: Secondary | ICD-10-CM | POA: Diagnosis not present

## 2016-09-09 DIAGNOSIS — Z79899 Other long term (current) drug therapy: Secondary | ICD-10-CM | POA: Diagnosis not present

## 2016-09-09 DIAGNOSIS — Z79891 Long term (current) use of opiate analgesic: Secondary | ICD-10-CM | POA: Diagnosis not present

## 2016-09-09 DIAGNOSIS — M47816 Spondylosis without myelopathy or radiculopathy, lumbar region: Secondary | ICD-10-CM | POA: Diagnosis not present

## 2016-09-09 DIAGNOSIS — M5136 Other intervertebral disc degeneration, lumbar region: Secondary | ICD-10-CM | POA: Diagnosis not present

## 2016-09-09 DIAGNOSIS — G894 Chronic pain syndrome: Secondary | ICD-10-CM | POA: Diagnosis not present

## 2016-09-14 DIAGNOSIS — M48062 Spinal stenosis, lumbar region with neurogenic claudication: Secondary | ICD-10-CM | POA: Diagnosis not present

## 2016-09-14 DIAGNOSIS — Z79891 Long term (current) use of opiate analgesic: Secondary | ICD-10-CM | POA: Diagnosis not present

## 2016-09-14 DIAGNOSIS — G894 Chronic pain syndrome: Secondary | ICD-10-CM | POA: Diagnosis not present

## 2016-09-22 DIAGNOSIS — H40013 Open angle with borderline findings, low risk, bilateral: Secondary | ICD-10-CM | POA: Diagnosis not present

## 2016-09-22 DIAGNOSIS — D3131 Benign neoplasm of right choroid: Secondary | ICD-10-CM | POA: Diagnosis not present

## 2016-09-22 DIAGNOSIS — H2513 Age-related nuclear cataract, bilateral: Secondary | ICD-10-CM | POA: Diagnosis not present

## 2016-09-25 DIAGNOSIS — M792 Neuralgia and neuritis, unspecified: Secondary | ICD-10-CM | POA: Diagnosis not present

## 2016-09-25 DIAGNOSIS — G894 Chronic pain syndrome: Secondary | ICD-10-CM | POA: Diagnosis not present

## 2016-09-25 DIAGNOSIS — M5416 Radiculopathy, lumbar region: Secondary | ICD-10-CM | POA: Diagnosis not present

## 2016-09-25 DIAGNOSIS — G5790 Unspecified mononeuropathy of unspecified lower limb: Secondary | ICD-10-CM | POA: Diagnosis not present

## 2016-10-08 DIAGNOSIS — M48062 Spinal stenosis, lumbar region with neurogenic claudication: Secondary | ICD-10-CM | POA: Diagnosis not present

## 2016-10-16 DIAGNOSIS — M48062 Spinal stenosis, lumbar region with neurogenic claudication: Secondary | ICD-10-CM | POA: Diagnosis not present

## 2016-10-20 ENCOUNTER — Encounter: Payer: Self-pay | Admitting: Family Medicine

## 2016-10-20 NOTE — Telephone Encounter (Signed)
Pt dropped off document to be filled out by provider, please faxed document when ready to [phone] attention Orson Slick. (document from [place] - 1 page). Document put at front office tray.

## 2016-10-21 NOTE — Telephone Encounter (Signed)
Forwarded paperwork to Provider with note attached about response [see note below], awaiting appointment to be scheduled /SLS 07/11 Conversation: Non-Urgent Medical Question   (Newest Message First) October 20, 2016  Mosie Lukes, MD  to [place]wing, Donell Sievert, CMA . Ricardo Jericho S . Claudia Desanctis M    9:42 PM  Can we find a place for her with me in the next couple of weeks? I will come in early if need be

## 2016-10-22 ENCOUNTER — Encounter: Payer: Self-pay | Admitting: Family Medicine

## 2016-10-22 ENCOUNTER — Ambulatory Visit (INDEPENDENT_AMBULATORY_CARE_PROVIDER_SITE_OTHER): Payer: Medicare Other | Admitting: Family Medicine

## 2016-10-22 VITALS — BP 124/70 | HR 79 | Temp 98.4°F | Resp 18 | Wt 102.8 lb

## 2016-10-22 DIAGNOSIS — Z01818 Encounter for other preprocedural examination: Secondary | ICD-10-CM | POA: Diagnosis not present

## 2016-10-22 DIAGNOSIS — E079 Disorder of thyroid, unspecified: Secondary | ICD-10-CM

## 2016-10-22 DIAGNOSIS — E785 Hyperlipidemia, unspecified: Secondary | ICD-10-CM

## 2016-10-22 DIAGNOSIS — M48061 Spinal stenosis, lumbar region without neurogenic claudication: Secondary | ICD-10-CM | POA: Diagnosis not present

## 2016-10-22 DIAGNOSIS — R9431 Abnormal electrocardiogram [ECG] [EKG]: Secondary | ICD-10-CM | POA: Diagnosis not present

## 2016-10-22 LAB — COMPREHENSIVE METABOLIC PANEL
ALBUMIN: 4.2 g/dL (ref 3.5–5.2)
ALT: 21 U/L (ref 0–35)
AST: 29 U/L (ref 0–37)
Alkaline Phosphatase: 55 U/L (ref 39–117)
BILIRUBIN TOTAL: 0.4 mg/dL (ref 0.2–1.2)
BUN: 13 mg/dL (ref 6–23)
CALCIUM: 10.2 mg/dL (ref 8.4–10.5)
CHLORIDE: 100 meq/L (ref 96–112)
CO2: 31 mEq/L (ref 19–32)
CREATININE: 0.82 mg/dL (ref 0.40–1.20)
GFR: 72.74 mL/min (ref >60.00)
Glucose, Bld: 91 mg/dL (ref 70–99)
Potassium: 3.6 mEq/L (ref 3.5–5.1)
Sodium: 141 mEq/L (ref 135–145)
Total Protein: 6.8 g/dL (ref 6.0–8.3)

## 2016-10-22 LAB — LIPID PANEL
CHOLESTEROL: 207 mg/dL — AB (ref 0–200)
HDL: 70.2 mg/dL (ref >39.00)
LDL Cholesterol: 122 mg/dL — ABNORMAL HIGH (ref 0–99)
NONHDL: 136.33
TRIGLYCERIDES: 70 mg/dL (ref 0.0–149.0)
Total CHOL/HDL Ratio: 3
VLDL: 14 mg/dL (ref 0.0–40.0)

## 2016-10-22 LAB — CBC
HCT: 39 % (ref 36.0–46.0)
HEMOGLOBIN: 13 g/dL (ref 12.0–15.0)
MCHC: 33.2 g/dL (ref 30.0–36.0)
MCV: 82.4 fl (ref 78.0–100.0)
PLATELETS: 249 10*3/uL (ref 150.0–400.0)
RBC: 4.74 Mil/uL (ref 3.87–5.11)
RDW: 14.4 % (ref 11.5–15.5)
WBC: 6.5 10*3/uL (ref 4.0–10.5)

## 2016-10-22 LAB — TSH

## 2016-10-22 LAB — T4, FREE: Free T4: 0.73 ng/dL (ref 0.60–1.60)

## 2016-10-22 NOTE — Patient Instructions (Addendum)
Hypothyroidism Hypothyroidism is a disorder of the thyroid. The thyroid is a large gland that is located in the lower front of the neck. The thyroid releases hormones that control how the body works. With hypothyroidism, the thyroid does not make enough of these hormones. What are the causes? Causes of hypothyroidism may include:  Viral infections.  Pregnancy.  Your own defense system (immune system) attacking your thyroid.  Certain medicines.  Birth defects.  Past radiation treatments to your head or neck.  Past treatment with radioactive iodine.  Past surgical removal of part or all of your thyroid.  Problems with the gland that is located in the center of your brain (pituitary).  What are the signs or symptoms? Signs and symptoms of hypothyroidism may include:  Feeling as though you have no energy (lethargy).  Inability to tolerate cold.  Weight gain that is not explained by a change in diet or exercise habits.  Dry skin.  Coarse hair.  Menstrual irregularity.  Slowing of thought processes.  Constipation.  Sadness or depression.  How is this diagnosed? Your health care provider may diagnose hypothyroidism with blood tests and ultrasound tests. How is this treated? Hypothyroidism is treated with medicine that replaces the hormones that your body does not make. After you begin treatment, it may take several weeks for symptoms to go away. Follow these instructions at home:  Take medicines only as directed by your health care provider.  If you start taking any new medicines, tell your health care provider.  Keep all follow-up visits as directed by your health care provider. This is important. As your condition improves, your dosage needs may change. You will need to have blood tests regularly so that your health care provider can watch your condition. Contact a health care provider if:  Your symptoms do not get better with treatment.  You are taking thyroid  replacement medicine and: ? You sweat excessively. ? You have tremors. ? You feel anxious. ? You lose weight rapidly. ? You cannot tolerate heat. ? You have emotional swings. ? You have diarrhea. ? You feel weak. Get help right away if:  You develop chest pain.  You develop an irregular heartbeat.  You develop a rapid heartbeat. This information is not intended to replace advice given to you by your health care provider. Make sure you discuss any questions you have with your health care provider. Document Released: 03/30/2005 Document Revised: 09/05/2015 Document Reviewed: 08/15/2013 Elsevier Interactive Patient Education  2017 Elsevier Inc.  

## 2016-10-22 NOTE — Progress Notes (Signed)
Subjective:  I acted as a Education administrator for Dr. Charlett Blake. [place], [place]  [name], female    DOB: September 13, 1945, 71 y.o.   MRN: [id number]  No chief complaint on file.   HPI  Patient is in today for surgical clearance. She is struggling with progressively worsening low back pain and has decided to proceed with a spinal cord stimulator with Dr. Rolena Infante a [facility] or so. She had a temporary device placed and did some response so she has decided to proceed. She denies any recent acute illness, fevers or hospitalizations. She otherwise feels well but her ongoing back pain has hampered her ability to stay active and take care of her activities of daily living to a degree. She is anxious to proceed. Denies CP/palp/SOB/HA/congestion/fevers/GI or GU c/o. Taking meds as prescribed  Patient Care Team: Mosie Lukes, MD as PCP - General (Family Medicine) Renie Ora, MD as Consulting Physician (Pain Medicine)   Past Medical History:  Diagnosis Date  . Abdominal pain 02/01/2015  . Allergy    "seasonal allergies"  . Anemia   . Arthritis    osteoarthritis-"DDD" hips. fingers, toes.  . Blood transfusion without reported diagnosis   . Complication of anesthesia   . Depression   . Depression with anxiety   . Frequent headaches    no problems now.  Marland Kitchen Hearing loss in right ear 03/10/2015   same over last 6 months  . Hip dislocation, right ([place])   . History of chicken pox   . Hyperlipidemia    pt. state no hx  . Hypothyroidism    supplement used  . Osteoporosis   . Peripheral neuropathy 02/01/2015   right hand"carpal tunnel"  . PONV (postoperative nausea and vomiting)   . Right hip pain 05/24/2015  . Scoliosis 02/01/2015  . Spinal stenosis of lumbar region 10/23/2016  . Thyroid disease   . URI, acute 03/10/2015  . 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 ACETABULAR REVISION Right 10/30/2015   Procedure: RIGHT HIP ACETABULAR REVISION /CONSTRAINED LINER (POSTERIOR APPROACH) ;  Surgeon: Gaynelle Arabian, MD;  Location: [facility];  Service: Orthopedics;  Laterality: Right;  . HIP CLOSED REDUCTION Right 06/30/2015   Procedure: CLOSED MANIPULATION HIP;  Surgeon: Melina Schools, MD;  Location: [facility];  Service: Orthopedics;  Laterality: Right;  . METACARPOPHALANGEAL JOINT CAPSULECTOMY / CAPSULOTOMY  1995  . TOTAL HIP ARTHROPLASTY  2011   left hip  . TOTAL HIP ARTHROPLASTY Right 06/12/2015   Procedure: RIGHT TOTAL HIP ARTHROPLASTY ANTERIOR APPROACH;  Surgeon: Gaynelle Arabian, MD;  Location: [facility];  Service: Orthopedics;  Laterality: Right;  . TOTAL HIP REVISION Right 07/19/2015   Procedure: RIGHT HIP BEARING SURFACE REVISION;  Surgeon: Gaynelle Arabian, MD;  Location: [facility];  Service: Orthopedics;  Laterality: Right;  . TUBAL LIGATION  1980   left fallopian tube removal    Family History  Problem Relation Age of Onset  . Arthritis Mother   . Dementia Mother   . Mental illness Father        suicide  . Arthritis Maternal Grandmother   . Kidney disease Son        b/l multicystic kidney disease s/p 4 transplants  . Colon cancer Neg Hx     Social History   Social History  . Marital status: Married    Spouse name: N/A  . Number of children: N/A  .  Years of education: 29   Occupational History  . retired    Social History Main Topics  . Smoking status: Never Smoker  . Smokeless tobacco: Never Used  . Alcohol use 0.6 oz/week    1 Glasses of wine per week  . Drug use: No  . 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 Not on file   Social History Narrative  . No narrative on file    Outpatient Medications Prior to Visit  Medication Sig Dispense Refill  . acetaminophen (TYLENOL) 500 MG tablet Take 500 mg by mouth at bedtime as needed for moderate pain.     Marland Kitchen aspirin 81 MG chewable tablet  Chew by mouth daily.    . cetirizine (ZYRTEC) 10 MG tablet Take 10 mg by mouth every morning.     . DULoxetine (CYMBALTA) 20 MG capsule Take 1 capsule (20 mg total) by mouth daily. (Patient taking differently: Take 60 mg by mouth every morning. ) 30 capsule 0  . furosemide (LASIX) 20 MG tablet Take 1 tablet (20 mg total) by mouth daily. 90 tablet 3  . gabapentin (NEURONTIN) 300 MG capsule Take 1 capsule (300 mg total) by mouth at bedtime. 90 capsule 3  . HYDROcodone-acetaminophen (NORCO) 7.5-325 MG tablet Take 1 tablet by mouth every 6 (six) hours as needed for moderate pain.    Marland Kitchen levothyroxine (SYNTHROID, LEVOTHROID) 50 MCG tablet Take 1 tablet (50 mcg total) by mouth daily. 90 tablet 3  . liothyronine (CYTOMEL) 25 MCG tablet Take 1 tablet (25 mcg total) by mouth daily. 90 tablet 3  . methocarbamol (ROBAXIN) 500 MG tablet Take 1 tablet (500 mg total) by mouth every 6 (six) hours as needed for muscle spasms. (Patient taking differently: Take 500 mg by mouth every 6 (six) hours as needed for muscle spasms. Only takes about once weekly) 80 tablet 0  . ondansetron (ZOFRAN) 4 MG tablet Take 1 tablet (4 mg total) by mouth every 8 (eight) hours as needed for nausea or vomiting. 4 tablet 1  . 0.9 %  sodium chloride infusion      No facility-administered medications prior to visit.     Allergies  Allergen Reactions  . Penicillins Hives    Has patient had a PCN reaction causing immediate rash, facial/tongue/throat swelling, SOB or lightheadedness with hypotension: No Has patient had a PCN reaction causing severe rash involving mucus membranes or skin necrosis: No Has patient had a PCN reaction that required hospitalization No Has patient had a PCN reaction occurring within the last 10 years: No If all of the above answers are "NO", then may proceed with Cephalosporin use.  . Sulfa Antibiotics Hives    ROS     Objective:    Physical Exam  BP 124/70 (BP Location: Left Arm, Patient Position:  Sitting, Cuff Size: Normal)   Pulse 79   Temp 98.4 F (36.9 C) (Oral)   Resp 18   Wt 102 lb 12.8 oz (46.6 kg)   SpO2 98%   BMI 16.59 kg/m  Wt Readings from Last 3 Encounters:  10/22/16 102 lb 12.8 oz (46.6 kg)  07/21/16 105 lb (47.6 kg)  07/07/16 106 lb 9.6 oz (48.4 kg)   BP Readings from Last 3 Encounters:  10/22/16 124/70  07/21/16 (!) 122/54  05/29/16 118/62      There is no immunization history on file for this patient.  Health Maintenance  Topic Date Due  . Hepatitis C Screening  [birth year]  . TETANUS/TDAP  04/29/1963  . DEXA SCAN  04/28/2009  . PNA vac Low Risk Adult (1 of 2 - PCV13) 04/28/2009  . INFLUENZA VACCINE  03/01/2017 (Originally 11/11/2016)  . MAMMOGRAM  02/12/2018  . COLONOSCOPY  07/22/2026    Lab Results  Component Value Date   WBC 6.5 10/22/2016   HGB 13.0 10/22/2016   HCT 39.0 10/22/2016   PLT 249.0 10/22/2016   GLUCOSE 91 10/22/2016   CHOL 207 (H) 10/22/2016   TRIG 70.0 10/22/2016   HDL 70.20 10/22/2016   LDLCALC 122 (H) 10/22/2016   ALT 21 10/22/2016   AST 29 10/22/2016   NA 141 10/22/2016   K 3.6 10/22/2016   CL 100 10/22/2016   CREATININE 0.82 10/22/2016   BUN 13 10/22/2016   CO2 31 10/22/2016   TSH <0.01 (L) 10/22/2016   INR 0.95 10/29/2015    Lab Results  Component Value Date   TSH <0.01 (L) 10/22/2016   Lab Results  Component Value Date   WBC 6.5 10/22/2016   HGB 13.0 10/22/2016   HCT 39.0 10/22/2016   MCV 82.4 10/22/2016   PLT 249.0 10/22/2016   Lab Results  Component Value Date   NA 141 10/22/2016   K 3.6 10/22/2016   CO2 31 10/22/2016   GLUCOSE 91 10/22/2016   BUN 13 10/22/2016   CREATININE 0.82 10/22/2016   BILITOT 0.4 10/22/2016   ALKPHOS 55 10/22/2016   AST 29 10/22/2016   ALT 21 10/22/2016   PROT 6.8 10/22/2016   ALBUMIN 4.2 10/22/2016   CALCIUM 10.2 10/22/2016   ANIONGAP 4 (L) 11/02/2015   GFR 72.74 10/22/2016   Lab Results  Component Value Date   CHOL 207 (H) 10/22/2016   Lab Results    Component Value Date   HDL 70.20 10/22/2016   Lab Results  Component Value Date   LDLCALC 122 (H) 10/22/2016   Lab Results  Component Value Date   TRIG 70.0 10/22/2016   Lab Results  Component Value Date   CHOLHDL 3 10/22/2016   No results found for: HGBA1C       Assessment & Plan:   Problem List Items Addressed This Visit    Hyperlipidemia    Encouraged heart healthy diet, increase exercise, avoid trans fats, consider a krill oil cap daily      Relevant Orders   Lipid panel (Completed)   Thyroid disease    Repeat labs show a suppressed TSH persists but her fre T4 is now WNL. No changes at this time      Relevant Orders   CBC (Completed)   Comprehensive metabolic panel (Completed)   TSH (Completed)   T4, free (Completed)   Abnormal EKG - Primary    In the past and since she is in for preparation for a surgery next month repeat ekg was ordered and reviewed. No concerns identified      Relevant Orders   EKG 12-Lead (Completed)   Spinal stenosis of lumbar region    Patient pain continues to escalate and she has decided to proceed with surgery with Dr Rolena Infante at [facility]. She had a temporary stimulator placed and had a 50% response so she is going to have a permanent device placed. She is aware not to take any OTC meds or supplements prior to the surgery. She is medically cleared for surgery and clearance is faxed to ortho.  Other Visit Diagnoses    Pre-operative exam       Relevant Orders   EKG 12-Lead (Completed)      I have discontinued Ms. Vogt-Nichols's methocarbamol and ondansetron. I am also having her maintain her DULoxetine, cetirizine, acetaminophen, liothyronine, levothyroxine, gabapentin, furosemide, aspirin, and HYDROcodone-acetaminophen. We will stop administering sodium chloride.  No orders of the defined types were placed in this encounter.   CMA served as Education administrator during this visit. History, Physical and Plan performed by medical  provider. Documentation and orders reviewed and attested to.  Penni Homans, MD

## 2016-10-23 ENCOUNTER — Encounter: Payer: Self-pay | Admitting: Family Medicine

## 2016-10-23 DIAGNOSIS — R9431 Abnormal electrocardiogram [ECG] [EKG]: Secondary | ICD-10-CM | POA: Insufficient documentation

## 2016-10-23 DIAGNOSIS — M48061 Spinal stenosis, lumbar region without neurogenic claudication: Secondary | ICD-10-CM

## 2016-10-23 HISTORY — DX: Spinal stenosis, lumbar region without neurogenic claudication: M48.061

## 2016-10-23 NOTE — Assessment & Plan Note (Signed)
Encouraged heart healthy diet, increase exercise, avoid trans fats, consider a krill oil cap daily 

## 2016-10-23 NOTE — Assessment & Plan Note (Signed)
Patient pain continues to escalate and she has decided to proceed with surgery with Dr Rolena Infante at [facility]. She had a temporary stimulator placed and had a 50% response so she is going to have a permanent device placed. She is aware not to take any OTC meds or supplements prior to the surgery. She is medically cleared for surgery and clearance is faxed to ortho.

## 2016-10-23 NOTE — Assessment & Plan Note (Signed)
Repeat labs show a suppressed TSH persists but her fre T4 is now WNL. No changes at this time

## 2016-10-23 NOTE — Assessment & Plan Note (Signed)
In the past and since she is in for preparation for a surgery next month repeat ekg was ordered and reviewed. No concerns identified

## 2016-10-27 DIAGNOSIS — H6983 Other specified disorders of Eustachian tube, bilateral: Secondary | ICD-10-CM | POA: Diagnosis not present

## 2016-11-02 ENCOUNTER — Ambulatory Visit: Payer: Self-pay | Admitting: Physician Assistant

## 2016-11-05 DIAGNOSIS — M1611 Unilateral primary osteoarthritis, right hip: Secondary | ICD-10-CM | POA: Diagnosis not present

## 2016-11-24 ENCOUNTER — Telehealth: Payer: Self-pay | Admitting: Family Medicine

## 2016-11-24 NOTE — Telephone Encounter (Signed)
Called pt to schedule awv. Pt stated that she would like to wait until the beginning of next year (2019) to schedule her appt.

## 2016-11-27 NOTE — Pre-Procedure Instructions (Addendum)
Misty Barker  11/27/2016      [facility] - [address], [place] [address] Phone: [phone] Fax: [phone]  [facility] [address] [place], [place] [address] [place] [address] Phone: [phone] Fax: [phone]  [facility] [place], [place] [address] [place] [address] Phone: [phone] Fax: [phone]    Your procedure is scheduled on  Wednesday, August 22nd     Report to [facility] Admitting at 5:30 AM             (posted surgery time 7:30 - 10:00am)   Call this number if you have problems the morning of surgery:  [phone], for other questions, call [phone] Mon - Fri from 8a-4p   Remember:   Do not eat food or drink liquids after midnight Tuesday.   Take these medicines the morning of surgery with A SIP OF WATER : Cymbalta, Hydrocodone, Levothyroxine.              4-5 days prior to surgery, STOP TAKING any anti-inflammatories, herbal supplements, vitamins   Do not wear jewelry, make-up or nail polish.  Do not wear lotions, powders, or perfumes, or deoderant.  Do not shave 48 hours prior to surgery.     Do not bring valuables to the hospital.  [facility] is not responsible for any belongings or valuables.  Contacts, dentures or bridgework may not be worn into surgery.  Leave your suitcase in the car.  After surgery it may be brought to your room.  For patients admitted to the hospital, discharge time will be determined by your treatment team.  Patients discharged the day of surgery will not be allowed to drive home.    Please read over the following fact sheets that you were given. Pain Booklet, MRSA Information and Surgical Site Infection Prevention       [facility]- Preparing For Surgery  Before surgery, you can play an important role.  Because skin is not sterile, your skin needs to be as free of germs as possible. You can reduce the number of germs on your skin by washing with CHG (chlorahexidine gluconate) Soap before surgery.  CHG is an antiseptic cleaner which kills germs and bonds with the skin to continue killing germs even after washing.  Please do not use if you have an allergy to CHG or antibacterial soaps. If your skin becomes reddened/irritated stop using the CHG.  Do not shave (including legs and underarms) for at least 48 hours prior to first CHG shower. It is OK to shave your face.  Please follow these instructions carefully.   1. Shower the NIGHT BEFORE SURGERY and the MORNING OF SURGERY with CHG.   2. If you chose to wash your hair, wash your hair first as usual with your normal shampoo.  3. After you shampoo, rinse your hair and body thoroughly to remove the shampoo.  4. Use CHG as you would any other liquid soap. You can apply CHG directly to the skin and wash gently with a scrungie or a clean washcloth.   5. Apply the CHG Soap to your body ONLY FROM THE NECK DOWN.  Do not use on open wounds or open sores. Avoid contact with your eyes, ears, mouth and genitals (private parts). Wash genitals (private parts) with your  normal soap.  6. Wash thoroughly, paying special attention to the area where your surgery will be performed.  7. Thoroughly rinse your body with warm water from the neck down.  8. DO NOT shower/wash with your normal soap after using and rinsing off the CHG Soap.  9. Pat yourself dry with a CLEAN TOWEL.   10. Wear CLEAN PAJAMAS   11. Place CLEAN SHEETS on your bed the night of your first shower and DO NOT SLEEP WITH PETS.    Day of Surgery: Do not apply any deodorants/lotions. Please wear clean clothes to the hospital/surgery center.

## 2016-11-27 NOTE — Pre-Procedure Instructions (Signed)
Virjean Boman Vogt-Nichols  11/27/2016      [facility] - [address], [place] [address] Phone: [phone] Fax: [phone]  [facility] [address] [place], [place] [address] [place] [address] Phone: [phone] Fax: [phone]  [facility] [place], [place] [address] [place] [address] Phone: [phone] Fax: [phone]    Your procedure is scheduled on Wednesday, December 02, 2016  Report to [facility] Admitting Entrance "A" at 5:30 A.M.   Call this number if you have problems the morning of surgery:  [phone]   Remember:  Do not eat food or drink liquids after midnight on December 01, 2016  Take these medicines the morning of surgery with A SIP OF WATER: Cetirizine (ZYRTEC), DULoxetine (CYMBALTA), Levothyroxine (SYNTHROID, LEVOTHROID, Liothyronine (CYTOMEL). If needed HYDROcodone-acetaminophen [facility]) for pain, Fluticasone (FLONASE) for allergies, and Ketotifen Fumarate (ZADITOR OP) for itchy eyes.  As of today, stop taking all Aspirins, Vitamins, Fish oils, and Herbal medications. Also stop all NSAIDS i.e. Advil, Motrin, Aleve, Anaprox, Naproxen, BC and Goody Powders.   Do not wear jewelry, make-up or nail polish.  Do not wear lotions, powders, or perfumes, or deodorant.  Do not shave 48 hours prior to surgery.   Do not bring valuables to the hospital.  [facility] is not responsible for any belongings or valuables.  Contacts, dentures or bridgework may not be worn into surgery.  Leave your suitcase in the car.  After surgery it may be brought to your room.  For patients admitted to the hospital, discharge time will be determined by your treatment team.  Patients discharged the day of surgery will not be allowed to drive home.   Special instructions:  [place]- Preparing For  Surgery  Before surgery, you can play an important role. Because skin is not sterile, your skin needs to be as free of germs as possible. You can reduce the number of germs on your skin by washing with CHG (chlorahexidine gluconate) Soap before surgery.  CHG is an antiseptic cleaner which kills germs and bonds with the skin to continue killing germs even after washing.  Please do not use if you have an allergy to CHG or antibacterial soaps. If your skin becomes reddened/irritated stop using the CHG.  Do not shave (including legs and underarms) for at least 48 hours prior to first CHG shower. It is OK to shave your face.  Please follow these instructions carefully.   1. Shower the NIGHT BEFORE SURGERY and the MORNING OF SURGERY with CHG.   2. If you chose to wash your hair, wash your hair first as usual with your normal shampoo.  3. After you shampoo, rinse your hair and body thoroughly to remove the shampoo.  4. Use CHG as you would any other liquid soap. You can apply CHG directly to the skin and wash gently with a scrungie or a clean washcloth.   5. Apply the CHG Soap to your body ONLY FROM THE NECK DOWN.  Do not use on open wounds or open sores. Avoid contact with your eyes, ears, mouth and genitals (private parts). Wash genitals (private parts) with your normal soap.  6. Wash thoroughly, paying special attention to the area where your surgery will be performed.  7. Thoroughly rinse your body with warm water from the neck  down.  8. DO NOT shower/wash with your normal soap after using and rinsing off the CHG Soap.  9. Pat yourself dry with a CLEAN TOWEL.   10. Wear CLEAN PAJAMAS   11. Place CLEAN SHEETS on your bed the night of your first shower and DO NOT SLEEP WITH PETS.  Day of Surgery: Do not apply any deodorants/lotions. Please wear clean clothes to the hospital/surgery center.    Please read over the following fact sheets that you were given. Pain Booklet, Coughing and Deep  Breathing, MRSA Information and Surgical Site Infection Prevention

## 2016-11-30 ENCOUNTER — Encounter (HOSPITAL_COMMUNITY): Payer: Self-pay

## 2016-11-30 ENCOUNTER — Encounter (HOSPITAL_COMMUNITY)
Admission: RE | Admit: 2016-11-30 | Discharge: 2016-11-30 | Disposition: A | Discharge status: Home / Self Care | Payer: Medicare Other | Source: Ambulatory Visit | Attending: Orthopedic Surgery | Admitting: Orthopedic Surgery

## 2016-11-30 DIAGNOSIS — M419 Scoliosis, unspecified: Secondary | ICD-10-CM | POA: Diagnosis not present

## 2016-11-30 DIAGNOSIS — G629 Polyneuropathy, unspecified: Secondary | ICD-10-CM | POA: Diagnosis not present

## 2016-11-30 DIAGNOSIS — F418 Other specified anxiety disorders: Secondary | ICD-10-CM | POA: Diagnosis not present

## 2016-11-30 DIAGNOSIS — E039 Hypothyroidism, unspecified: Secondary | ICD-10-CM | POA: Diagnosis not present

## 2016-11-30 DIAGNOSIS — E785 Hyperlipidemia, unspecified: Secondary | ICD-10-CM | POA: Diagnosis not present

## 2016-11-30 DIAGNOSIS — Z79899 Other long term (current) drug therapy: Secondary | ICD-10-CM | POA: Diagnosis not present

## 2016-11-30 DIAGNOSIS — M48062 Spinal stenosis, lumbar region with neurogenic claudication: Secondary | ICD-10-CM | POA: Diagnosis not present

## 2016-11-30 DIAGNOSIS — Z96643 Presence of artificial hip joint, bilateral: Secondary | ICD-10-CM | POA: Diagnosis not present

## 2016-11-30 DIAGNOSIS — Z7982 Long term (current) use of aspirin: Secondary | ICD-10-CM | POA: Diagnosis not present

## 2016-11-30 DIAGNOSIS — G894 Chronic pain syndrome: Secondary | ICD-10-CM | POA: Diagnosis not present

## 2016-11-30 DIAGNOSIS — Z880 Allergy status to penicillin: Secondary | ICD-10-CM | POA: Diagnosis not present

## 2016-11-30 LAB — BASIC METABOLIC PANEL
Anion gap: 9 (ref 5–15)
BUN: 14 mg/dL (ref 6–20)
CALCIUM: 9.8 mg/dL (ref 8.9–10.3)
CO2: 28 mmol/L (ref 22–32)
CREATININE: 0.74 mg/dL (ref 0.44–1.00)
Chloride: 100 mmol/L — ABNORMAL LOW (ref 101–111)
GFR calc Af Amer: >60 mL/min (ref >60)
Glucose, Bld: 88 mg/dL (ref 65–99)
POTASSIUM: 3.5 mmol/L (ref 3.5–5.1)
SODIUM: 137 mmol/L (ref 135–145)

## 2016-11-30 LAB — CBC
HCT: 39.4 % (ref 36.0–46.0)
Hemoglobin: 12.6 g/dL (ref 12.0–15.0)
MCH: 26.5 pg (ref 26.0–34.0)
MCHC: 32 g/dL (ref 30.0–36.0)
MCV: 82.8 fL (ref 78.0–100.0)
PLATELETS: 265 10*3/uL (ref 150–400)
RBC: 4.76 MIL/uL (ref 3.87–5.11)
RDW: 14.6 % (ref 11.5–15.5)
WBC: 6.5 10*3/uL (ref 4.0–10.5)

## 2016-11-30 LAB — SURGICAL PCR SCREEN
MRSA, PCR: NEGATIVE
Staphylococcus aureus: NEGATIVE

## 2016-12-01 MED ORDER — VANCOMYCIN HCL IN DEXTROSE 1-5 GM/200ML-% IV SOLN
1000.0000 mg | INTRAVENOUS | Status: AC
  Administered 2016-12-02: 1000 mg via INTRAVENOUS
  Filled 2016-12-01: qty 200

## 2016-12-02 ENCOUNTER — Ambulatory Visit (HOSPITAL_COMMUNITY): Payer: Medicare Other

## 2016-12-02 ENCOUNTER — Encounter (HOSPITAL_COMMUNITY): Payer: Self-pay | Admitting: *Deleted

## 2016-12-02 ENCOUNTER — Encounter (HOSPITAL_COMMUNITY)
Admission: RE | Disposition: A | Discharge status: Home / Self Care | Payer: Self-pay | Source: Ambulatory Visit | Attending: Orthopedic Surgery

## 2016-12-02 ENCOUNTER — Observation Stay (HOSPITAL_COMMUNITY)
Admission: RE | Admit: 2016-12-02 | Discharge: 2016-12-03 | Disposition: A | Discharge status: Home / Self Care | Payer: Medicare Other | Source: Ambulatory Visit | Attending: Orthopedic Surgery | Admitting: Orthopedic Surgery

## 2016-12-02 ENCOUNTER — Ambulatory Visit (HOSPITAL_COMMUNITY): Payer: Medicare Other | Admitting: Certified Registered"

## 2016-12-02 DIAGNOSIS — M5136 Other intervertebral disc degeneration, lumbar region: Secondary | ICD-10-CM | POA: Diagnosis not present

## 2016-12-02 DIAGNOSIS — E039 Hypothyroidism, unspecified: Secondary | ICD-10-CM | POA: Insufficient documentation

## 2016-12-02 DIAGNOSIS — Z7982 Long term (current) use of aspirin: Secondary | ICD-10-CM | POA: Diagnosis not present

## 2016-12-02 DIAGNOSIS — G8929 Other chronic pain: Secondary | ICD-10-CM | POA: Diagnosis not present

## 2016-12-02 DIAGNOSIS — Z96643 Presence of artificial hip joint, bilateral: Secondary | ICD-10-CM | POA: Diagnosis not present

## 2016-12-02 DIAGNOSIS — M549 Dorsalgia, unspecified: Secondary | ICD-10-CM | POA: Diagnosis not present

## 2016-12-02 DIAGNOSIS — M419 Scoliosis, unspecified: Secondary | ICD-10-CM | POA: Insufficient documentation

## 2016-12-02 DIAGNOSIS — R52 Pain, unspecified: Secondary | ICD-10-CM

## 2016-12-02 DIAGNOSIS — M48062 Spinal stenosis, lumbar region with neurogenic claudication: Secondary | ICD-10-CM | POA: Diagnosis not present

## 2016-12-02 DIAGNOSIS — G629 Polyneuropathy, unspecified: Secondary | ICD-10-CM | POA: Insufficient documentation

## 2016-12-02 DIAGNOSIS — R109 Unspecified abdominal pain: Secondary | ICD-10-CM

## 2016-12-02 DIAGNOSIS — M961 Postlaminectomy syndrome, not elsewhere classified: Secondary | ICD-10-CM | POA: Diagnosis not present

## 2016-12-02 DIAGNOSIS — Z79899 Other long term (current) drug therapy: Secondary | ICD-10-CM | POA: Insufficient documentation

## 2016-12-02 DIAGNOSIS — Z880 Allergy status to penicillin: Secondary | ICD-10-CM | POA: Diagnosis not present

## 2016-12-02 DIAGNOSIS — Z9889 Other specified postprocedural states: Secondary | ICD-10-CM

## 2016-12-02 DIAGNOSIS — M48061 Spinal stenosis, lumbar region without neurogenic claudication: Secondary | ICD-10-CM | POA: Diagnosis not present

## 2016-12-02 DIAGNOSIS — F418 Other specified anxiety disorders: Secondary | ICD-10-CM | POA: Diagnosis not present

## 2016-12-02 DIAGNOSIS — E785 Hyperlipidemia, unspecified: Secondary | ICD-10-CM | POA: Insufficient documentation

## 2016-12-02 DIAGNOSIS — G894 Chronic pain syndrome: Principal | ICD-10-CM | POA: Insufficient documentation

## 2016-12-02 DIAGNOSIS — T85192A Other mechanical complication of implanted electronic neurostimulator (electrode) of spinal cord, initial encounter: Secondary | ICD-10-CM | POA: Diagnosis not present

## 2016-12-02 DIAGNOSIS — Z9689 Presence of other specified functional implants: Secondary | ICD-10-CM

## 2016-12-02 DIAGNOSIS — M5442 Lumbago with sciatica, left side: Secondary | ICD-10-CM | POA: Diagnosis not present

## 2016-12-02 DIAGNOSIS — Z419 Encounter for procedure for purposes other than remedying health state, unspecified: Secondary | ICD-10-CM

## 2016-12-02 HISTORY — PX: SPINAL CORD STIMULATOR INSERTION: SHX5378

## 2016-12-02 SURGERY — INSERTION, SPINAL CORD STIMULATOR, LUMBAR
Anesthesia: General

## 2016-12-02 MED ORDER — ROCURONIUM BROMIDE 100 MG/10ML IV SOLN
INTRAVENOUS | Status: DC | PRN
  Administered 2016-12-02: 10 mg via INTRAVENOUS
  Administered 2016-12-02: 50 mg via INTRAVENOUS
  Administered 2016-12-02: 20 mg via INTRAVENOUS
  Administered 2016-12-02: 10 mg via INTRAVENOUS

## 2016-12-02 MED ORDER — LACTATED RINGERS IV SOLN
INTRAVENOUS | Status: DC
  Administered 2016-12-02: 15:00:00 via INTRAVENOUS

## 2016-12-02 MED ORDER — SODIUM CHLORIDE 0.9% FLUSH
3.0000 mL | Freq: Two times a day (BID) | INTRAVENOUS | Status: DC
  Administered 2016-12-02: 3 mL via INTRAVENOUS

## 2016-12-02 MED ORDER — LIOTHYRONINE SODIUM 25 MCG PO TABS
25.0000 ug | ORAL_TABLET | Freq: Every day | ORAL | Status: DC
  Administered 2016-12-03: 25 ug via ORAL
  Filled 2016-12-02: qty 1

## 2016-12-02 MED ORDER — PHENOL 1.4 % MT LIQD: 1.0000 | OROMUCOSAL | Status: DC | PRN

## 2016-12-02 MED ORDER — FENTANYL CITRATE (PF) 250 MCG/5ML IJ SOLN
INTRAMUSCULAR | Status: AC
  Filled 2016-12-02: qty 5

## 2016-12-02 MED ORDER — DULOXETINE HCL 30 MG PO CPEP
60.0000 mg | ORAL_CAPSULE | Freq: Every day | ORAL | Status: DC
  Administered 2016-12-03: 60 mg via ORAL
  Filled 2016-12-02: qty 2

## 2016-12-02 MED ORDER — HYDROCODONE-ACETAMINOPHEN 5-325 MG PO TABS: 1.0000 | ORAL_TABLET | Freq: Four times a day (QID) | ORAL | 0 refills | Status: AC | PRN

## 2016-12-02 MED ORDER — SODIUM CHLORIDE 0.9% FLUSH: 3.0000 mL | INTRAVENOUS | Status: DC | PRN

## 2016-12-02 MED ORDER — BUPIVACAINE-EPINEPHRINE (PF) 0.25% -1:200000 IJ SOLN
INTRAMUSCULAR | Status: AC
  Filled 2016-12-02: qty 30

## 2016-12-02 MED ORDER — MENTHOL 3 MG MT LOZG: 1.0000 | LOZENGE | OROMUCOSAL | Status: DC | PRN

## 2016-12-02 MED ORDER — HYDROMORPHONE HCL 1 MG/ML IJ SOLN
INTRAMUSCULAR | Status: AC
  Filled 2016-12-02: qty 1

## 2016-12-02 MED ORDER — ALUM & MAG HYDROXIDE-SIMETH 200-200-20 MG/5ML PO SUSP
30.0000 mL | ORAL | Status: DC | PRN
  Administered 2016-12-02: 30 mL via ORAL
  Filled 2016-12-02: qty 30

## 2016-12-02 MED ORDER — FLUTICASONE PROPIONATE 50 MCG/ACT NA SUSP
1.0000 | Freq: Every day | NASAL | Status: DC | PRN
  Filled 2016-12-02: qty 16

## 2016-12-02 MED ORDER — ONDANSETRON HCL 4 MG/2ML IJ SOLN
INTRAMUSCULAR | Status: DC | PRN
  Administered 2016-12-02: 4 mg via INTRAVENOUS

## 2016-12-02 MED ORDER — POLYETHYLENE GLYCOL 3350 17 G PO PACK: 17.0000 g | PACK | Freq: Every day | ORAL | Status: DC | PRN

## 2016-12-02 MED ORDER — OXYCODONE HCL 5 MG PO TABS: 5.0000 mg | ORAL_TABLET | Freq: Once | ORAL | Status: DC | PRN

## 2016-12-02 MED ORDER — FUROSEMIDE 20 MG PO TABS
20.0000 mg | ORAL_TABLET | Freq: Every day | ORAL | Status: DC
  Administered 2016-12-02 – 2016-12-03 (×2): 20 mg via ORAL
  Filled 2016-12-02 (×2): qty 1

## 2016-12-02 MED ORDER — THROMBIN 5000 UNITS EX SOLR
CUTANEOUS | Status: AC
Start: 2016-12-02 — End: ?
  Filled 2016-12-02: qty 5000

## 2016-12-02 MED ORDER — THROMBIN 20000 UNITS EX SOLR
CUTANEOUS | Status: AC
  Filled 2016-12-02: qty 20000

## 2016-12-02 MED ORDER — ONDANSETRON 4 MG PO TBDP: 4.0000 mg | ORAL_TABLET | Freq: Three times a day (TID) | ORAL | 0 refills | Status: DC | PRN

## 2016-12-02 MED ORDER — SUGAMMADEX SODIUM 200 MG/2ML IV SOLN
INTRAVENOUS | Status: AC
  Filled 2016-12-02: qty 2

## 2016-12-02 MED ORDER — LEVOTHYROXINE SODIUM 100 MCG PO TABS
50.0000 ug | ORAL_TABLET | Freq: Every day | ORAL | Status: DC
  Administered 2016-12-03: 50 ug via ORAL
  Filled 2016-12-02: qty 1

## 2016-12-02 MED ORDER — LIDOCAINE 2% (20 MG/ML) 5 ML SYRINGE
INTRAMUSCULAR | Status: AC
  Filled 2016-12-02: qty 5

## 2016-12-02 MED ORDER — SODIUM CHLORIDE 0.9 % IV SOLN: 250.0000 mL | INTRAVENOUS | Status: DC

## 2016-12-02 MED ORDER — FENTANYL CITRATE (PF) 100 MCG/2ML IJ SOLN
INTRAMUSCULAR | Status: DC | PRN
  Administered 2016-12-02: 25 ug via INTRAVENOUS
  Administered 2016-12-02: 50 ug via INTRAVENOUS

## 2016-12-02 MED ORDER — LORATADINE 10 MG PO TABS
10.0000 mg | ORAL_TABLET | Freq: Every day | ORAL | Status: DC
  Administered 2016-12-03: 10 mg via ORAL
  Filled 2016-12-02: qty 1

## 2016-12-02 MED ORDER — VANCOMYCIN HCL IN DEXTROSE 750-5 MG/150ML-% IV SOLN
750.0000 mg | Freq: Once | INTRAVENOUS | Status: AC
  Administered 2016-12-02: 750 mg via INTRAVENOUS
  Filled 2016-12-02: qty 150

## 2016-12-02 MED ORDER — THROMBIN 20000 UNITS EX SOLR
CUTANEOUS | Status: DC | PRN
  Administered 2016-12-02: 20000 [IU] via TOPICAL

## 2016-12-02 MED ORDER — MAGNESIUM CITRATE PO SOLN
1.0000 | Freq: Once | ORAL | Status: AC | PRN
  Administered 2016-12-03: 1 via ORAL
  Filled 2016-12-02: qty 296

## 2016-12-02 MED ORDER — ACETAMINOPHEN 10 MG/ML IV SOLN
INTRAVENOUS | Status: AC
  Filled 2016-12-02: qty 100

## 2016-12-02 MED ORDER — ONDANSETRON HCL 4 MG/2ML IJ SOLN
INTRAMUSCULAR | Status: AC
  Filled 2016-12-02: qty 2

## 2016-12-02 MED ORDER — ACETAMINOPHEN 325 MG PO TABS: 650.0000 mg | ORAL_TABLET | ORAL | Status: DC | PRN

## 2016-12-02 MED ORDER — GABAPENTIN 300 MG PO CAPS
300.0000 mg | ORAL_CAPSULE | Freq: Every day | ORAL | Status: DC
  Administered 2016-12-02: 300 mg via ORAL
  Filled 2016-12-02: qty 1

## 2016-12-02 MED ORDER — ONDANSETRON HCL 4 MG PO TABS
4.0000 mg | ORAL_TABLET | Freq: Four times a day (QID) | ORAL | Status: DC | PRN
  Administered 2016-12-03: 4 mg via ORAL
  Filled 2016-12-02: qty 1

## 2016-12-02 MED ORDER — DOCUSATE SODIUM 100 MG PO CAPS
100.0000 mg | ORAL_CAPSULE | Freq: Two times a day (BID) | ORAL | Status: DC
  Administered 2016-12-02 – 2016-12-03 (×3): 100 mg via ORAL
  Filled 2016-12-02 (×3): qty 1

## 2016-12-02 MED ORDER — DEXAMETHASONE SODIUM PHOSPHATE 10 MG/ML IJ SOLN
INTRAMUSCULAR | Status: AC
  Filled 2016-12-02: qty 1

## 2016-12-02 MED ORDER — SUGAMMADEX SODIUM 200 MG/2ML IV SOLN
INTRAVENOUS | Status: DC | PRN
  Administered 2016-12-02: 90.8 mg via INTRAVENOUS

## 2016-12-02 MED ORDER — MEPERIDINE HCL 25 MG/ML IJ SOLN: 6.2500 mg | INTRAMUSCULAR | Status: DC | PRN

## 2016-12-02 MED ORDER — LIDOCAINE HCL (CARDIAC) 20 MG/ML IV SOLN
INTRAVENOUS | Status: DC | PRN
  Administered 2016-12-02: 60 mg via INTRAVENOUS

## 2016-12-02 MED ORDER — HEMOSTATIC AGENTS (NO CHARGE) OPTIME
TOPICAL | Status: DC | PRN
  Administered 2016-12-02 (×2): 1 via TOPICAL

## 2016-12-02 MED ORDER — HYDROMORPHONE HCL 1 MG/ML IJ SOLN
INTRAMUSCULAR | Status: AC
Start: 2016-12-02 — End: 2016-12-02
  Administered 2016-12-02: 0.25 mg via INTRAVENOUS
  Filled 2016-12-02: qty 1

## 2016-12-02 MED ORDER — LACTATED RINGERS IV SOLN
INTRAVENOUS | Status: DC | PRN
  Administered 2016-12-02 (×2): via INTRAVENOUS

## 2016-12-02 MED ORDER — PROPOFOL 10 MG/ML IV BOLUS
INTRAVENOUS | Status: DC | PRN
  Administered 2016-12-02: 130 mg via INTRAVENOUS

## 2016-12-02 MED ORDER — PROPOFOL 10 MG/ML IV BOLUS
INTRAVENOUS | Status: AC
  Filled 2016-12-02: qty 20

## 2016-12-02 MED ORDER — ACETAMINOPHEN 10 MG/ML IV SOLN
INTRAVENOUS | Status: DC | PRN
  Administered 2016-12-02: 675 mg via INTRAVENOUS

## 2016-12-02 MED ORDER — DEXAMETHASONE SODIUM PHOSPHATE 10 MG/ML IJ SOLN
INTRAMUSCULAR | Status: DC | PRN
  Administered 2016-12-02: 10 mg via INTRAVENOUS

## 2016-12-02 MED ORDER — OXYCODONE HCL 5 MG/5ML PO SOLN: 5.0000 mg | Freq: Once | ORAL | Status: DC | PRN

## 2016-12-02 MED ORDER — MIDAZOLAM HCL 2 MG/2ML IJ SOLN
INTRAMUSCULAR | Status: AC
Start: 2016-12-02 — End: ?
  Filled 2016-12-02: qty 2

## 2016-12-02 MED ORDER — MIDAZOLAM HCL 5 MG/5ML IJ SOLN
INTRAMUSCULAR | Status: DC | PRN
  Administered 2016-12-02: 2 mg via INTRAVENOUS

## 2016-12-02 MED ORDER — ROCURONIUM BROMIDE 10 MG/ML (PF) SYRINGE
PREFILLED_SYRINGE | INTRAVENOUS | Status: AC
  Filled 2016-12-02: qty 5

## 2016-12-02 MED ORDER — 0.9 % SODIUM CHLORIDE (POUR BTL) OPTIME
TOPICAL | Status: DC | PRN
  Administered 2016-12-02: 1000 mL

## 2016-12-02 MED ORDER — HYDROMORPHONE HCL 1 MG/ML IJ SOLN
0.2500 mg | INTRAMUSCULAR | Status: DC | PRN
  Administered 2016-12-02 (×2): 0.25 mg via INTRAVENOUS
  Administered 2016-12-02: 0.5 mg via INTRAVENOUS
  Administered 2016-12-02 (×2): 0.25 mg via INTRAVENOUS
  Administered 2016-12-02: 0.5 mg via INTRAVENOUS

## 2016-12-02 MED ORDER — BUPIVACAINE-EPINEPHRINE 0.25% -1:200000 IJ SOLN
INTRAMUSCULAR | Status: DC | PRN
  Administered 2016-12-02: 10 mL

## 2016-12-02 MED ORDER — ONDANSETRON HCL 4 MG/2ML IJ SOLN
4.0000 mg | Freq: Four times a day (QID) | INTRAMUSCULAR | Status: DC | PRN
  Administered 2016-12-03: 4 mg via INTRAVENOUS
  Filled 2016-12-02: qty 2

## 2016-12-02 MED ORDER — ACETAMINOPHEN 650 MG RE SUPP: 650.0000 mg | RECTAL | Status: DC | PRN

## 2016-12-02 MED ORDER — HYDROCODONE-ACETAMINOPHEN 7.5-325 MG PO TABS
1.0000 | ORAL_TABLET | ORAL | Status: DC | PRN
  Administered 2016-12-02 – 2016-12-03 (×6): 2 via ORAL
  Filled 2016-12-02 (×6): qty 2

## 2016-12-02 MED ORDER — PROMETHAZINE HCL 25 MG/ML IJ SOLN: 6.2500 mg | INTRAMUSCULAR | Status: DC | PRN

## 2016-12-02 SURGICAL SUPPLY — 62 items
AGENT HMST KT MTR STRL THRMB (HEMOSTASIS) ×1
AGENT HMST MTR 8 SURGIFLO (HEMOSTASIS) ×1
CANISTER SUCT 3000ML PPV (MISCELLANEOUS) ×3 IMPLANT
CLOSURE STERI-STRIP 1/2X4 (GAUZE/BANDAGES/DRESSINGS) ×2
CLSR STERI-STRIP ANTIMIC 1/2X4 (GAUZE/BANDAGES/DRESSINGS) ×3 IMPLANT
CONTROLLER PROCLAIM IPG E5 PAT (Neuro Prosthesis/Implant) ×2 IMPLANT
COVER MAYO STAND STRL (DRAPES) ×12 IMPLANT
COVER PROBE W GEL 5X96 (DRAPES) IMPLANT
COVER SURGICAL LIGHT HANDLE (MISCELLANEOUS) ×3 IMPLANT
DRAPE C-ARM 42X72 X-RAY (DRAPES) ×3 IMPLANT
DRAPE INCISE IOBAN 85X60 (DRAPES) ×3 IMPLANT
DRAPE SURG 17X23 STRL (DRAPES) ×3 IMPLANT
DRAPE U-SHAPE 47X51 STRL (DRAPES) ×3 IMPLANT
DRSG AQUACEL AG ADV 3.5X 6 (GAUZE/BANDAGES/DRESSINGS) ×6 IMPLANT
DRSG OPSITE POSTOP 4X6 (GAUZE/BANDAGES/DRESSINGS) ×4 IMPLANT
DURAPREP 26ML APPLICATOR (WOUND CARE) ×3 IMPLANT
ELECT BLADE 4.0 EZ CLEAN MEGAD (MISCELLANEOUS)
ELECT CAUTERY BLADE 6.4 (BLADE) ×3 IMPLANT
ELECT PENCIL ROCKER SW 15FT (MISCELLANEOUS) ×3 IMPLANT
ELECT REM PT RETURN 9FT ADLT (ELECTROSURGICAL) ×3
ELECTRODE BLDE 4.0 EZ CLN MEGD (MISCELLANEOUS) IMPLANT
ELECTRODE REM PT RTRN 9FT ADLT (ELECTROSURGICAL) ×1 IMPLANT
GLOVE BIO SURGEON STRL SZ7 (GLOVE) ×3 IMPLANT
GLOVE BIOGEL PI IND STRL 7.0 (GLOVE) ×1 IMPLANT
GLOVE BIOGEL PI IND STRL 8.5 (GLOVE) ×1 IMPLANT
GLOVE BIOGEL PI INDICATOR 7.0 (GLOVE) ×2
GLOVE BIOGEL PI INDICATOR 8.5 (GLOVE) ×2
GLOVE SS N UNI LF 8.5 STRL (GLOVE) ×6 IMPLANT
GOWN STRL REUS W/ TWL LRG LVL3 (GOWN DISPOSABLE) ×2 IMPLANT
GOWN STRL REUS W/TWL 2XL LVL3 (GOWN DISPOSABLE) ×3 IMPLANT
GOWN STRL REUS W/TWL LRG LVL3 (GOWN DISPOSABLE) ×6
KIT BASIN OR (CUSTOM PROCEDURE TRAY) ×3 IMPLANT
KIT ROOM TURNOVER OR (KITS) ×3 IMPLANT
LAMI NARROW PRIPOLE 16CH (Neuro Prosthesis/Implant) ×2 IMPLANT
NDL SPNL 18GX3.5 QUINCKE PK (NEEDLE) ×1 IMPLANT
NEEDLE 22X1 1/2 (OR ONLY) (NEEDLE) ×3 IMPLANT
NEEDLE SPNL 18GX3.5 QUINCKE PK (NEEDLE) ×6 IMPLANT
NS IRRIG 1000ML POUR BTL (IV SOLUTION) ×3 IMPLANT
PACK LAMINECTOMY ORTHO (CUSTOM PROCEDURE TRAY) ×3 IMPLANT
PACK UNIVERSAL I (CUSTOM PROCEDURE TRAY) ×3 IMPLANT
PAD ARMBOARD 7.5X6 YLW CONV (MISCELLANEOUS) ×6 IMPLANT
PROCLAIM PATIENT CONTROLLER ×2 IMPLANT
SPATULA SILICONE BRAIN 10MM (MISCELLANEOUS) ×2 IMPLANT
SPOGE SURGIFLO 8M (HEMOSTASIS) ×2
SPONGE LAP 4X18 X RAY DECT (DISPOSABLE) ×2 IMPLANT
SPONGE SURGIFLO 8M (HEMOSTASIS) IMPLANT
SPONGE SURGIFOAM ABS GEL 100 (HEMOSTASIS) ×3 IMPLANT
STAPLER VISISTAT 35W (STAPLE) ×3 IMPLANT
SURGIFLO W/THROMBIN 8M KIT (HEMOSTASIS) ×2 IMPLANT
SUT BONE WAX W31G (SUTURE) ×3 IMPLANT
SUT ETHIBOND 2 OS 4 DA (SUTURE) ×5 IMPLANT
SUT MNCRL AB 3-0 PS2 18 (SUTURE) ×4 IMPLANT
SUT MON AB 3-0 SH 27 (SUTURE) ×3
SUT MON AB 3-0 SH27 (SUTURE) IMPLANT
SUT VIC AB 1 CT1 18XCR BRD 8 (SUTURE) ×2 IMPLANT
SUT VIC AB 1 CT1 8-18 (SUTURE) ×6
SUT VIC AB 2-0 CT1 18 (SUTURE) ×7 IMPLANT
SYR BULB IRRIGATION 50ML (SYRINGE) ×3 IMPLANT
SYR CONTROL 10ML LL (SYRINGE) ×3 IMPLANT
TOWEL OR 17X24 6PK STRL BLUE (TOWEL DISPOSABLE) ×3 IMPLANT
TOWEL OR 17X26 10 PK STRL BLUE (TOWEL DISPOSABLE) ×3 IMPLANT
WATER STERILE IRR 1000ML POUR (IV SOLUTION) ×3 IMPLANT

## 2016-12-02 NOTE — Anesthesia Postprocedure Evaluation (Signed)
Anesthesia Post Note  Patient: Misty Barker  Procedure(s) Performed: Procedure(s) (LRB): LUMBAR SPINAL CORD STIMULATOR INSERTION (N/A)     Patient location during evaluation: PACU Anesthesia Type: General Level of consciousness: awake and alert Pain management: pain level controlled Vital Signs Assessment: post-procedure vital signs reviewed and stable Respiratory status: spontaneous breathing, nonlabored ventilation and respiratory function stable Cardiovascular status: blood pressure returned to baseline and stable Postop Assessment: no signs of nausea or vomiting Anesthetic complications: no    Last Vitals:  Vitals:   12/02/16 1152 12/02/16 1205  BP:  121/75  Pulse: 76 76  Resp: 14 (!) 27  Temp:    SpO2: 100% 100%    Last Pain:  Vitals:   12/02/16 1221  TempSrc:   PainSc: Asleep                 Lynda Rainwater

## 2016-12-02 NOTE — Brief Op Note (Signed)
12/02/2016  10:52 AM  PATIENT:  Misty Barker  71 y.o. female  PRE-OPERATIVE DIAGNOSIS:  Chronic back and leg pain  POST-OPERATIVE DIAGNOSIS:  Chronic back and leg pain  PROCEDURE:  Procedure(s) with comments: LUMBAR SPINAL CORD STIMULATOR INSERTION (N/A) - 2.5 hrs  SURGEON:  Surgeon(s) and Role:    Melina Schools, MD - Primary  PHYSICIAN ASSISTANT:   ASSISTANTS: Carmen Mayo   ANESTHESIA:   general  EBL:  Total I/O In: 1000 [I.V.:1000] Out: -   BLOOD ADMINISTERED:none  DRAINS: none   LOCAL MEDICATIONS USED:  MARCAINE     SPECIMEN:  No Specimen  DISPOSITION OF SPECIMEN:  N/A  COUNTS:  YES  TOURNIQUET:  * No tourniquets in log *  DICTATION: .Dragon Dictation  PLAN OF CARE: Admit to inpatient   PATIENT DISPOSITION:  PACU - hemodynamically stable.

## 2016-12-02 NOTE — Discharge Instructions (Signed)
Laminectomy, Care After °This sheet gives you information about how to care for yourself after your procedure. Your health care provider may also give you more specific instructions. If you have problems or questions, contact your health care provider. °What can I expect after the procedure? °After the procedure, it is common to have: °· Some pain around your incision area. °· Muscle tightening (spasms) across the back. ° °Follow these instructions at home: °Incision care °· Follow instructions from your health care provider about how to take care of your incision area. Make sure you: °? Wash your hands with soap and water before and after you apply medicine to the area or change your bandage (dressing). If soap and water are not available, use hand sanitizer. °? Change your dressing as told by your health care provider. °? Leave stitches (sutures), skin glue, or adhesive strips in place. These skin closures may need to stay in place for 2 weeks or longer. If adhesive strip edges start to loosen and curl up, you may trim the loose edges. Do not remove adhesive strips completely unless your health care provider tells you to do that. °· Check your incision area every day for signs of infection. Check for: °? More redness, swelling, or pain. °? More fluid or blood. °? Warmth. °? Pus or a bad smell. °Medicines °· Take over-the-counter and prescription medicines only as told by your health care provider. °· If you were prescribed an antibiotic medicine, use it as told by your health care provider. Do not stop using the antibiotic even if you start to feel better. °Bathing °· Do not take baths, swim, or use a hot tub for 2 weeks, or until your incision has healed completely. °· If your health care provider approves, you may take showers after your dressing has been removed. °Activity °· Return to your normal activities as told by your health care provider. Ask your health care provider what activities are safe for  you. °· Avoid bending or twisting at your waist. Always bend at your knees. °· Do not sit for more than 20-30 minutes at a time. Lie down or walk between periods of sitting. °· Do not lift anything that is heavier than 10 lb (4.5 kg) or the limit that your health care provider tells you, until he or she says that it is safe. °· Do not drive for 2 weeks after your procedure or for as long as your health care provider tells you. °· Do not drive or use heavy machinery while taking prescription pain medicine. °General instructions °· To prevent or treat constipation while you are taking prescription pain medicine, your health care provider may recommend that you: °? Drink enough fluid to keep your urine clear or pale yellow. °? Take over-the-counter or prescription medicines. °? Eat foods that are high in fiber, such as fresh fruits and vegetables, whole grains, and beans. °? Limit foods that are high in fat and processed sugars, such as fried and sweet foods. °· Do breathing exercises as told. °· Keep all follow-up visits as told by your health care provider. This is important. °Contact a health care provider if: °· You have more redness, swelling, or pain around your incision area. °· Your incision feels warm to the touch. °· You are not able to return to activities or do exercises as told by your health care provider. °Get help right away if: °· You have: °? More fluid or blood coming from your incision area. °? Pus or   a bad smell coming from your incision area. °? Chills or a fever. °? Episodes of dizziness or fainting while standing. °· You develop a rash. °· You develop shortness of breath or you have difficulty breathing. °· You cannot control when you urinate or have a bowel movement. °· You become weak. °· You are not able to use your legs. °Summary °· After the procedure, it is common to have some pain around your incision area. You may also have muscle tightening (spasms) across the back. °· Follow  instructions from your health care provider about how to care for your incision. °· Do not lift anything that is heavier than 10 lb (4.5 kg) or the limit that your health care provider tells you, until he or she says that it is safe. °· Contact your health care provider if you have more redness, swelling, or pain around your incision area or if your incision feels warm to the touch. These can be signs of infection. °This information is not intended to replace advice given to you by your health care provider. Make sure you discuss any questions you have with your health care provider. °Document Released: 10/17/2004 Document Revised: 11/14/2015 Document Reviewed: 09/15/2015 °Elsevier Interactive Patient Education © 2018 [employer] ° °

## 2016-12-02 NOTE — Progress Notes (Signed)
Carmen PA aware of pt coing of stomach cramps

## 2016-12-02 NOTE — Op Note (Signed)
Orthopedic report.  Preoperative diagnosis. Chronic pain syndrome.  Postoperative diagnosis. Same.  Operative procedure. Spinal cord stimulator implantation.  Condition stable.   Complications. None.  First assistant [facility].  Implant system used. [employer] Jude's tri-pole spinal cord stimulator paddle. Nonrechargeable battery.  Intraoperative findings. Lateral x-ray view of the spinal cord stimulator demonstrated rotational positioning of the implant. Ultimately performed T10 laminotomy to confirm satisfactory position of the implant at the T10 level. AP x-ray was satisfactory. Lateral view continues to show that the paddle essentially was coming over the right side of the thecal sac. Under direct visualization. Confirm that it was not anterior to the thecal sac. Ultimately felt to be properly positioned for relief of her significant right lower extremity pain.  Indications. This is a pleasant 71 year old woman with chronic debilitating neuropathic right leg pain and moderate back pain. Attempts at conservative management had failed to alleviate her symptoms. As a result of the ongoing pain she elected to proceed with surgery. She had a successful trial with significant relief of pain.  Operative report. Patient was brought the operating room placed supine the operating room table. After successful induction of general anesthesia and endotracheal intubation teds SCDs were applied and she was turned prone onto the Wilson frame. Patient was then properly positioned so that all bony prominences well-padded. The back was prepped and draped in a standard fashion. Timeout was then taken to confirm patient procedure and all other important data.  Using lateral and AP fluoroscopy views identify the T12 vertebral body. I then marked the incision sites pain bit from the inferior aspect T11 to the  inferior aspect of the T12 spinous process. Sharp dissection was carried out down to the deep fascia. I  gently stripped the paraspinal muscles to expose the T11 and T12 spinous processes. I continued my dissection out laterally to expose the T11 lamina. Repeat fluoroscopy views confirmed I was at the T11 level. Using a double-action Leksell rongeur I removed the inferior portion of T11 spinous process and then used my 2 mm and 3 mm Kerrison punch to perform a small laminotomy in the left. I then dissected using a Penfield 4 to the central raphae of the ligamentum flavum and used my 2 mm Kerrison to resect the ligamentum flavum and expose the dorsal surface of the thecal sac once this was done I then gently passed the dural spatula underneath T11 superiorly to the she 910 disc space. I confirm central positioning of the dural spatula in the AP view. This was easily passed without any significant compression or difficulty. I then obtained the implant itself and gently passed the superior on the AP view it was slightly off to the right hand side and a span from the inferior aspect of the T9 across the T10 vertebral body. I confirmed with the [employer] Jude rep that this in fact was covering the area that the trial did for maximum pain relief.  I then dissected into the T 11/12 interspinous ligament and then secured the 2 leads directly to the T12 spinous process using an Ethibond suture I then wrapped this the leads around the T12 spinous process in order to stabilize to prevent migration. I then made a second incision over the left gluteal region and dissected down a depth of 4 cm. I created a pocket and then advanced the leads from the thoracic wound submuscular to the gluteal wound. Nonrechargeable battery was obtained connected the leads torqued. Once they were tightened down and secured to  the battery I then placed battery into the pocket and secured to the deep fascia with 2 #1 Vicryl sutures. At this point the battery was functioning the leads were functioning as well according to the rep. I then took a final AP and  lateral view of the battery and noted that there was some rotation to the paddle. On the lateral view I felt associated battery 19/5 at this point I elected to do it T10 laminotomy to confirm its position. Using a similar technique I exposed the inferior aspect of the T10 spinous process and lamina. I removed the interspinous process ligament between T10 and T11 and then used 2 mm Kerrison punch to perform a laminotomy I then used the 2 mm Kerrison rongeur to remove the ligamentum flavum and expose the dorsal surface of the thecal sac. At this point I removed the paddle palpated underneath the T10 lamina and then reinserted the trial devices. Trial was noted to be naturally rotate towards the right. I then passed the actual paddle back and directed towards the central region. It is at this point I noted that even when the paddle was placed centrally still rotated over to the right. It actually was causing last compression to the thecal sac as it rotated  towards the right.  At this point I could directly visualize the paddle as it was passing from T11 to resect 10. There was no ongoing compression of the thecal sac. At this point I elected to leave the paddle slightly rotated over to the right. It is my feeling that the patient had a wide spinal canal and because the device was narrow it was naturally rotating over off to the side. However it did not appear to be causing any significant thecal sac compression.  At this point both wounds were copiously irrigated with normal saline. Hemostasis was obtained using bipolar cautery and FloSeal. I then closed the deep fascia both wounds with interrupted #1 Vicryl suture. I then closed the deep fascia and interrupted 2-0 Vicryl sutures and 3-0 Monocryl for the skin. Steri-Strips and a dry dressing were applied. Patient was extubated transferred the PACU that incident. The end of the case all needle sponge counts were correct.

## 2016-12-02 NOTE — Anesthesia Procedure Notes (Signed)
Procedure Name: Intubation Date/Time: 12/02/2016 8:19 AM Performed by: Freddie Breech Pre-anesthesia Checklist: Patient identified, Emergency Drugs available, Suction available and Patient being monitored Patient Re-evaluated:Patient Re-evaluated prior to induction Oxygen Delivery Method: Circle System Utilized Preoxygenation: Pre-oxygenation with 100% oxygen Induction Type: IV induction and Cricoid Pressure applied Ventilation: Mask ventilation without difficulty Laryngoscope Size: Mac and 4 Grade View: Grade I Tube type: Oral Tube size: 7.0 mm Number of attempts: 1 Airway Equipment and Method: Stylet and Oral airway Placement Confirmation: ETT inserted through vocal cords under direct vision,  positive ETCO2 and breath sounds checked- equal and bilateral Secured at: 21 cm Tube secured with: Tape Dental Injury: Teeth and Oropharynx as per pre-operative assessment

## 2016-12-02 NOTE — H&P (Signed)
History of Present Illness The patient is a 71 year old female who comes in today for a preoperative History and Physical. The patient is scheduled for a SCS placement to be performed by Dr. Duane Lope D. Rolena Infante, MD at [facility] on 12-02-16 . The pt reports chronic back pain and chronic hip pain. Pt had 3 hip surgerys on the right. Pt has hypothyroidism.   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 SulfADIAZINE *Sulfonamides  Hives. Allergies Reconciled   Family History  Kidney disease  child Osteoarthritis  mother, grandmother mothers side and grandfather mothers side Rheumatoid Arthritis  grandfather mothers side Father  Deceased. age 91 Mother  Deceased. age 34  Social History  Tobacco / smoke exposure  no Tobacco use  never smoker Alcohol use  current drinker; drinks wine; only occasionally per week Children  2 Current work status  retired Engineer, agricultural (Currently)  no Drug/Alcohol Rehab (Previously)  no Exercise  Exercises daily; does running / walking and gym / weights Illicit drug use  no Living situation  live with spouse Marital status  married Number of flights of stairs before winded  2-3 Pain Contract  yes  Medication History  Norco (7.5-325MG  Tablet, 1 (one) Oral daily, Taken starting 11/05/2016) Active. Cymbalta (60MG  Capsule DR Part, 1 (one)  Capsule Oral daily, Taken starting 09/14/2016) Active. Aspirin (81MG  Tablet DR, Oral) Active. (qd) Levothyroxine Sodium (50MCG Tablet, Oral) Active. (qd) Cytomel (Oral) Specific strength unknown - Active. Liothyronine Sodium (25MCG Tablet, Oral daily) Active. Vitamin B Complex (Oral daily) Active. Vitamin D (2000UNIT Capsule, Oral daily) Active. Calcium Carbonate (1250 (500 Ca)MG Capsule, Oral) Active. (qd) ZyrTEC (10MG  Tablet, Oral daily) Active. Furosemide (20MG  Tablet, Oral daily) Active. (qd) Gabapentin (300MG  Capsule, Oral at bedtime) Active. Medications Reconciled  Past Surgical History  Tonsillectomy  Total Hip Replacement - Right [06/12/2015]: Other Orthopaedic Surgery  Total Hip Replacement  left  Other Problems  Chronic Pain  Depression  Hypothyroidism  Osteoarthritis  Varicose veins  Degenerative Disc Disease  Measles  Menopause   Vitals 11/24/2016 7:53 AM Weight: 107 lb Height: 66in Body Surface Area: 1.53 m Body Mass Index: 17.27 kg/m  Temp.: 98.55F(Oral)  Pulse: 76 (Regular)  BP: 137/69 (Sitting, Left Arm, Standard)  General General Appearance-Not in acute distress. Orientation-Oriented X3. Build & Nutrition-Well nourished and Well developed.  Integumentary General Characteristics Surgical Scars - surgical scarring consistent with previous right hip surgery, surgical scarring consistent with previous left hip surgery, no surgical scar evidence of previous lumbar surgery. Lumbar Spine-Skin examination of the lumbar spine is without deformity, skin lesions, lacerations or abrasions.  Chest and Lung Exam Auscultation Breath sounds - Normal and Clear.  Cardiovascular Auscultation Rhythm - Regular rate and rhythm.  Abdomen Palpation/Percussion Palpation and Percussion of the abdomen reveal - Soft, Non Tender and No Rebound tenderness.  Peripheral Vascular Lower Extremity Palpation - Posterior tibial  pulse - Bilateral - 2+. Dorsalis pedis pulse - Bilateral - 2+.  Neurologic Sensation Lower Extremity - Bilateral - sensation is  intact in the lower extremity. Reflexes Patellar Reflex - Bilateral - 2+. Achilles Reflex - Bilateral - 2+. Clonus - Bilateral - clonus not present. Testing Seated Straight Leg Raise - Bilateral - Seated straight leg raise negative.  Musculoskeletal Spine/Ribs/Pelvis  Lumbosacral Spine: Inspection and Palpation - Tenderness - left lumbar paraspinals tender to palpation and right lumbar paraspinals tender to palpation. Note: right hip pain. Strength and Tone: Strength - Hip Flexion - Left - 5/5. Right - 3/5. Knee Extension - Bilateral - 5/5. Knee Flexion - Bilateral - 5/5. Ankle Dorsiflexion - Bilateral - 5/5. Ankle Plantarflexion - Bilateral - 5/5. ROM - Flexion - moderately decreased range of motion and painful. Extension - moderately decreased range of motion and painful. Left Lateral Bending - moderately decreased range of motion and painful. Right Lateral Bending - moderately decreased range of motion and painful. Right Rotation - moderately decreased range of motion and painful. Left Rotation - moderately decreased range of motion and painful. Pain - neither flexion or extension is more painful than the other. Lumbosacral Spine - Waddell's Signs - no Waddell's signs present. Lower Extremity Range of Motion - No true hip, knee or ankle pain with range of motion. Gait and Station - [employer] - no assistive devices.  She is very thin with palpable and visible spinous processes in the lumbar spine. Her thoracic MRI shows no significant spinal stenosis or other canal compromise that would prohibit placement of the spinal cord stimulator.  At this point, we have gone through the procedure and all of her questions were addressed. Risks were reviewed which include infection, bleeding, nerve damage, death, stroke, paralysis, failure to heal, migration of the lead,  ongoing or worse pain, loss of bowel and bladder control, major bleeding, need for revision surgery because of hardware failure. All her questions were addressed. She really would like to have the non rechargeable battery. However, because she is very thin finding a proper place for this that would not cause her significant pain may be difficult and I told her we may have to place the rechargeable battery in as this is thiner. We will make that decision most likely intraoperatively.

## 2016-12-02 NOTE — Progress Notes (Signed)
Pt had 500cc urine on bladder scan , foley placed per Dr Rolena Infante order to be dced in am

## 2016-12-02 NOTE — Transfer of Care (Signed)
Immediate Anesthesia Transfer of Care Note  Patient: Misty Barker  Procedure(s) Performed: Procedure(s) with comments: LUMBAR SPINAL CORD STIMULATOR INSERTION (N/A) - 2.5 hrs  Patient Location: PACU  Anesthesia Type:General  Level of Consciousness: drowsy and patient cooperative  Airway & Oxygen Therapy: Patient Spontanous Breathing and Patient connected to face mask oxygen  Post-op Assessment: Report given to RN and Post -op Vital signs reviewed and stable  Post vital signs: Reviewed and stable  Last Vitals:  Vitals:   12/02/16 1118 12/02/16 1121  BP:    Pulse:    Resp:    Temp: (!) 36.3 C (!) (P) 36.1 C  SpO2:      Last Pain:  Vitals:   12/02/16 1118  TempSrc: Tympanic  PainSc:          Complications: No apparent anesthesia complications

## 2016-12-02 NOTE — Anesthesia Preprocedure Evaluation (Addendum)
Anesthesia Evaluation  Patient identified by MRN, date of birth, ID band Patient awake    Reviewed: Allergy & Precautions, NPO status , Patient's Chart, lab work & pertinent test results  History of Anesthesia Complications (+) PONV and history of anesthetic complications  Airway Mallampati: II  TM Distance: >3 FB Neck ROM: Full    Dental  (+) Dental Advisory Given   Pulmonary neg pulmonary ROS,    breath sounds clear to auscultation       Cardiovascular negative cardio ROS   Rhythm:Regular Rate:Normal     Neuro/Psych  Headaches, Depression    GI/Hepatic negative GI ROS, Neg liver ROS,   Endo/Other  Hypothyroidism   Renal/GU negative Renal ROS     Musculoskeletal  (+) Arthritis , Osteoarthritis,  scoliosis   Abdominal   Peds  Hematology negative hematology ROS (+)   Anesthesia Other Findings   Reproductive/Obstetrics                             Anesthesia Physical  Anesthesia Plan  ASA: II  Anesthesia Plan: General   Post-op Pain Management:    Induction: Intravenous  PONV Risk Score and Plan: 4 or greater and Ondansetron, Dexamethasone, Midazolam and Treatment may vary due to age or medical condition  Airway Management Planned: Oral ETT  Additional Equipment:   Intra-op Plan:   Post-operative Plan: Extubation in OR  Informed Consent: I have reviewed the patients History and Physical, chart, labs and discussed the procedure including the risks, benefits and alternatives for the proposed anesthesia with the patient or authorized representative who has indicated his/her understanding and acceptance.   Dental advisory given  Plan Discussed with: CRNA and Surgeon  Anesthesia Plan Comments: (Plan routine monitors)        Anesthesia Quick Evaluation  

## 2016-12-02 NOTE — Progress Notes (Signed)
Pharmacy Antibiotic Note  Misty Barker is a 71 y.o. female admitted on 12/02/2016 with surgical prophylaxis.  Pharmacy has been consulted for vancomycin dosing.  Pre-op vancomycin 1g given at 0800 this morning. No drain in place.  Plan: Vancomycin 750mg  IV x1 at [place] to sign off as no future doses required  Height: 5\' 3"  (160 cm) Weight: 100 lb (45.4 kg) IBW/kg (Calculated) : 52.4  Temp (24hrs), Avg:97.4 F (36.3 C), Min:97 F (36.1 C), Max:97.7 F (36.5 C)   Recent Labs Lab 11/30/16 1331  WBC 6.5  CREATININE 0.74    Estimated Creatinine Clearance: 45.6 mL/min (by C-G formula based on SCr of 0.74 mg/dL).    Allergies  Allergen Reactions  . Penicillins Hives    Childhood allergy  Has patient had a PCN reaction causing immediate rash, facial/tongue/throat swelling, SOB or lightheadedness with hypotension: No Has patient had a PCN reaction causing severe rash involving mucus membranes or skin necrosis: No Has patient had a PCN reaction that required hospitalization No Has patient had a PCN reaction occurring within the last 10 years: No If all of the above answers are "NO", then may proceed with Cephalosporin use.  . Sulfa Antibiotics Hives   Thank you for allowing pharmacy to be a part of this patient's care.  Sherika Kubicki 12/02/2016 2:20 PM

## 2016-12-03 ENCOUNTER — Observation Stay (HOSPITAL_COMMUNITY): Payer: Medicare Other

## 2016-12-03 ENCOUNTER — Encounter (HOSPITAL_COMMUNITY): Payer: Self-pay | Admitting: Orthopedic Surgery

## 2016-12-03 DIAGNOSIS — M419 Scoliosis, unspecified: Secondary | ICD-10-CM | POA: Diagnosis not present

## 2016-12-03 DIAGNOSIS — G894 Chronic pain syndrome: Secondary | ICD-10-CM | POA: Diagnosis not present

## 2016-12-03 DIAGNOSIS — E039 Hypothyroidism, unspecified: Secondary | ICD-10-CM | POA: Diagnosis not present

## 2016-12-03 DIAGNOSIS — Z880 Allergy status to penicillin: Secondary | ICD-10-CM | POA: Diagnosis not present

## 2016-12-03 DIAGNOSIS — M48062 Spinal stenosis, lumbar region with neurogenic claudication: Secondary | ICD-10-CM | POA: Diagnosis not present

## 2016-12-03 DIAGNOSIS — Z96643 Presence of artificial hip joint, bilateral: Secondary | ICD-10-CM | POA: Diagnosis not present

## 2016-12-03 DIAGNOSIS — R140 Abdominal distension (gaseous): Secondary | ICD-10-CM | POA: Diagnosis not present

## 2016-12-03 MED ORDER — FLEET ENEMA 7-19 GM/118ML RE ENEM
1.0000 | ENEMA | Freq: Once | RECTAL | Status: AC
  Administered 2016-12-03: 1 via RECTAL
  Filled 2016-12-03: qty 1

## 2016-12-03 NOTE — Progress Notes (Signed)
    Subjective: Procedure(s) (LRB): LUMBAR SPINAL CORD STIMULATOR INSERTION (N/A) 1 Day Post-Op  Patient reports pain as 3 on 0-10 scale.  Reports decreased leg pain reports incisional back pain   Positive void Negative bowel movement Positive flatus Negative chest pain or shortness of breath  Objective: Vital signs in last 24 hours: Temp:  [97 F (36.1 C)-98.7 F (37.1 C)] 98.3 F (36.8 C) (08/23 0802) Pulse Rate:  [70-81] 71 (08/23 0802) Resp:  [13-27] 20 (08/23 0802) BP: (109-145)/(56-75) 114/58 (08/23 0802) SpO2:  [78 %-100 %] 100 % (08/23 0802)  Intake/Output from previous day: 08/22 0701 - 08/23 0700 In: 1429.5 [I.V.:1429.5] Out: 1400 [Urine:1300; Blood:100]  Labs:  Recent Labs  11/30/16 1331  WBC 6.5  RBC 4.76  HCT 39.4  PLT 265    Recent Labs  11/30/16 1331  NA 137  K 3.5  CL 100*  CO2 28  BUN 14  CREATININE 0.74  GLUCOSE 88  CALCIUM 9.8   No results for input(s): LABPT, INR in the last 72 hours.  Physical Exam: Neurologically intact Intact pulses distally Incision: dressing C/D/I Compartment soft ABd - tender to palpation.  Negative rebound tenderness  Assessment/Plan: Patient stable  xrays n/a Continue mobilization with physical therapy Continue care  Advance diet Up with therapy  Will check KUB Fleet for constipation Neuro intact.  Plan on d/c to home later today or in AM  Melina Schools, MD [facility] [phone],

## 2016-12-03 NOTE — Evaluation (Signed)
Physical Therapy Evaluation Patient Details Name: Misty Barker MRN: [id number] DOB: [birth year] Today's Date: 12/03/2016   History of Present Illness  Pt is a 71 y/o female who presents s/p insertion of lumbar spinal cord stimulator on 12/02/16.  Clinical Impression  Pt admitted with above diagnosis. Pt currently with functional limitations due to the deficits listed below (see PT Problem List). At the time of PT eval pt was able to perform transfers and ambulation with gross min guard assist to supervision for safety. Will have husband's assistance at home however he works 4 hours in the morning M-F. Pt will benefit from skilled PT to increase their independence and safety with mobility to allow discharge to the venue listed below.       Follow Up Recommendations No PT follow up;Supervision for mobility/OOB    Equipment Recommendations  None recommended by PT    Recommendations for Other Services       Precautions / Restrictions Precautions Precautions: Fall;Back Precaution Booklet Issued: Yes (comment) Precaution Comments: Reviewed handout in detail. Pt was cued for precautions during functional mobility.  Required Braces or Orthoses:  ("No brace needed" order) Restrictions Weight Bearing Restrictions: No      Mobility  Bed Mobility               General bed mobility comments: Pt was sitting up in chair upon PT arrival. Reviewed log roll verbally  Transfers Overall transfer level: Needs assistance Equipment used: None;Rolling walker (2 wheeled) Transfers: Sit to/from Stand Sit to Stand: Supervision         General transfer comment: Initially without RW. Was able to transition to standing but required RW before initiating gait training.   Ambulation/Gait Ambulation/Gait assistance: Min guard;Supervision Ambulation Distance (Feet): 300 Feet Assistive device: Rolling walker (2 wheeled) Gait Pattern/deviations: Step-through pattern;Decreased stride  length;Narrow base of support Gait velocity: Decreased Gait velocity interpretation: Below normal speed for age/gender General Gait Details: VC's for general safety with the RW and improved posture. Pt ambulating well with RW and demonstrates smooth gait.   Stairs Stairs: Yes Stairs assistance: Min guard Stair Management: One rail Left;Step to pattern;Forwards Number of Stairs: 3 General stair comments: VC's for sequencing and general safety.   Wheelchair Mobility    Modified Rankin (Stroke Patients Only)       Balance Overall balance assessment: Needs assistance Sitting-balance support: No upper extremity supported;Feet supported Sitting balance-Leahy Scale: Fair Sitting balance - Comments: limited by pain   Standing balance support: Bilateral upper extremity supported;During functional activity Standing balance-Leahy Scale: Fair Standing balance comment: statically, required UE support for safe dynamic movement                             Pertinent Vitals/Pain Pain Assessment: Faces Faces Pain Scale: Hurts even more Pain Location: incision site Pain Descriptors / Indicators: Operative site guarding;Discomfort Pain Intervention(s): Limited activity within patient's tolerance;Monitored during session;Repositioned    Home Living Family/patient expects to be discharged to:: Private residence Living Arrangements: Spouse/significant other Available Help at Discharge: Family;Available PRN/intermittently Type of Home: House Home Access: Stairs to enter Entrance Stairs-Rails: Left (front steps) Entrance Stairs-Number of Steps: 1 at garage, 3 at front steps Home Layout: One level Home Equipment: [place] - 2 wheels;Shower seat - built in;Bedside commode;Adaptive equipment Additional Comments: Husband works 4 hours in the morning 5 days a week    Prior Function Level of Independence: Independent  Comments: Loves mowing her lawn, and staying "busy" at  home.      Hand Dominance   Dominant Hand: Right    Extremity/Trunk Assessment   Upper Extremity Assessment Upper Extremity Assessment: Defer to OT evaluation    Lower Extremity Assessment Lower Extremity Assessment: RLE deficits/detail RLE Deficits / Details: Pt reports 3 hip surgeries and 3 hip dislocations in the past year. Noted weakness compared to the L side    Cervical / Trunk Assessment Cervical / Trunk Assessment: Kyphotic (Appears frail)  Communication   Communication: No difficulties  Cognition Arousal/Alertness: Awake/alert Behavior During Therapy: WFL for tasks assessed/performed Overall Cognitive Status: Within Functional Limits for tasks assessed                                        General Comments      Exercises     Assessment/Plan    PT Assessment Patient needs continued PT services  PT Problem List Decreased strength;Decreased range of motion;Decreased activity tolerance;Decreased mobility;Decreased balance;Decreased knowledge of use of DME;Decreased knowledge of precautions;Decreased safety awareness;Pain       PT Treatment Interventions DME instruction;Gait training;Stair training;Functional mobility training;Therapeutic activities;Therapeutic exercise;Neuromuscular re-education;Patient/family education    PT Goals (Current goals can be found in the Care Plan section)  Acute Rehab PT Goals Patient Stated Goal: Home at d/c; mow her lawn PT Goal Formulation: With patient Time For Goal Achievement: 12/10/16 Potential to Achieve Goals: Good    Frequency Min 5X/week   Barriers to discharge        Co-evaluation               AM-PAC PT "6 Clicks" Daily Activity  Outcome Measure Difficulty turning over in bed (including adjusting bedclothes, sheets and blankets)?: None Difficulty moving from lying on back to sitting on the side of the bed? : A Little Difficulty sitting down on and standing up from a chair with arms  (e.g., wheelchair, bedside commode, etc,.)?: A Little Help needed moving to and from a bed to chair (including a wheelchair)?: A Little Help needed walking in hospital room?: A Little Help needed climbing 3-5 steps with a railing? : A Little 6 Click Score: 19    End of Session Equipment Utilized During Treatment: Gait belt Activity Tolerance: Patient tolerated treatment well Patient left: in chair;with call bell/phone within reach Nurse Communication: Mobility status PT Visit Diagnosis: Unsteadiness on feet (R26.81);Hemiplegia and hemiparesis;Other symptoms and signs involving the nervous system (R29.898)    Time: 4259-5638 PT Time Calculation (min) (ACUTE ONLY): 24 min   Charges:   PT Evaluation $PT Eval Low Complexity: 1 Low PT Treatments $Gait Training: 8-22 mins   PT G Codes:   PT G-Codes **NOT FOR INPATIENT CLASS** Functional Assessment Tool Used: Clinical judgement Functional Limitation: Mobility: Walking and moving around Mobility: Walking and Moving Around Current Status (V5643): At least 20 percent but less than 40 percent impaired, limited or restricted Mobility: Walking and Moving Around Goal Status [phone]): At least 1 percent but less than 20 percent impaired, limited or restricted    Rolinda Roan, PT, DPT Acute Rehabilitation Services Pager: [place] 12/03/2016, 9:22 AM

## 2016-12-03 NOTE — Evaluation (Signed)
Occupational Therapy Evaluation and Discharge Patient Details Name: Misty Barker MRN: [id number] DOB: 1946-01-08 Today's Date: 12/03/2016    History of Present Illness Pt is a 71 y/o female who presents s/p insertion of lumbar spinal cord stimulator on 12/02/16.   Clinical Impression   Pt reports she was independent with ADL PTA. Currently pt min guard with ADL and functional mobility. All back, safety, and ADL education completed with pt. Pt planning to d/c home with supervision from family. No further acute OT needs identified; signing off at this time. Please re-consult if needs change. Thank you for this referral.     Follow Up Recommendations  No OT follow up;Supervision/Assistance - 24 hour (initially)    Equipment Recommendations  None recommended by OT    Recommendations for Other Services       Precautions / Restrictions Precautions Precautions: Fall;Back Precaution Booklet Issued: No Precaution Comments: Reviewed back precautions with pt Required Braces or Orthoses:  ("No brace needed" order) Restrictions Weight Bearing Restrictions: No      Mobility Bed Mobility               General bed mobility comments: Pt OOB in chair upon arrival.  Transfers Overall transfer level: Needs assistance Equipment used: None Transfers: Sit to/from Stand Sit to Stand: Supervision         General transfer comment: for safety without RW    Balance Overall balance assessment: Needs assistance Sitting-balance support: Feet supported;No upper extremity supported Sitting balance-Leahy Scale: Good Sitting balance - Comments: limited by pain   Standing balance support: Single extremity supported;During functional activity Standing balance-Leahy Scale: Fair Standing balance comment: statically, required UE support for safe dynamic movement                           ADL either performed or assessed with clinical judgement   ADL Overall ADL's :  Needs assistance/impaired Eating/Feeding: Set up;Sitting   Grooming: Supervision/safety;Standing Grooming Details (indicate cue type and reason): Educated on use of 2 cups for oral care Upper Body Bathing: Set up;Sitting   Lower Body Bathing: Min guard;Sit to/from stand   Upper Body Dressing : Set up;Sitting   Lower Body Dressing: Min guard;Sit to/from stand Lower Body Dressing Details (indicate cue type and reason): Pt able to cross foot over opposite knee. Educated on compensatory strategies for LB ADL Toilet Transfer: Min guard;Ambulation;RW     Toileting - Clothing Manipulation Details (indicate cue type and reason): Educated on peri care technique without twisting Tub/ Shower Transfer: Min guard;Walk-in shower;Ambulation;Shower seat;Rolling walker   Functional mobility during ADLs: Surveyor, minerals     Praxis      Pertinent Vitals/Pain Pain Assessment: Faces Faces Pain Scale: Hurts even more Pain Location: back Pain Descriptors / Indicators: Discomfort;Sore Pain Intervention(s): Monitored during session     Hand Dominance Right   Extremity/Trunk Assessment Upper Extremity Assessment Upper Extremity Assessment: Overall WFL for tasks assessed   Lower Extremity Assessment Lower Extremity Assessment: Defer to PT evaluation    Cervical / Trunk Assessment Cervical / Trunk Assessment: Kyphotic   Communication Communication Communication: No difficulties   Cognition Arousal/Alertness: Awake/alert Behavior During Therapy: WFL for tasks assessed/performed Overall Cognitive Status: Within Functional Limits for tasks assessed  General Comments       Exercises     Shoulder Instructions      Home Living Family/patient expects to be discharged to:: Private residence Living Arrangements: Spouse/significant other Available Help at Discharge: Family;Available  PRN/intermittently Type of Home: House Home Access: Stairs to enter [employer] of Steps: 1 at garage, 3 at front steps Entrance Stairs-Rails: Left Home Layout: One level     Bathroom Shower/Tub: Occupational psychologist: Handicapped height Bathroom Accessibility: Yes   Home Equipment: Environmental consultant - 2 wheels;Bedside commode;Adaptive equipment;Shower Theme park manager: Financial trader Comments: Husband works 4 hours in the morning 5 days a week      Prior Functioning/Environment Level of Independence: Independent        Comments: Loves mowing her lawn, and staying "busy" at home.         OT Problem List:        OT Treatment/Interventions:      OT Goals(Current goals can be found in the care plan section) Acute Rehab OT Goals Patient Stated Goal: return home OT Goal Formulation: All assessment and education complete, DC therapy  OT Frequency:     Barriers to D/C:            Co-evaluation              AM-PAC PT "6 Clicks" Daily Activity     Outcome Measure Help from another person eating meals?: None Help from another person taking care of personal grooming?: A Little Help from another person toileting, which includes using toliet, bedpan, or urinal?: A Little Help from another person bathing (including washing, rinsing, drying)?: A Little Help from another person to put on and taking off regular upper body clothing?: None Help from another person to put on and taking off regular lower body clothing?: A Little 6 Click Score: 20   End of Session Equipment Utilized During Treatment: Rolling walker Nurse Communication: Mobility status;Other (comment) (no equipment or f/u needs)  Activity Tolerance: Patient tolerated treatment well Patient left: in chair;with call bell/phone within reach  OT Visit Diagnosis: Unsteadiness on feet (R26.81);Pain Pain - part of body:  (back)                Time: 2952-8413 OT Time Calculation (min): 10  min Charges:  OT General Charges $OT Visit: 1 Procedure OT Evaluation $OT Eval Moderate Complexity: 1 Procedure G-Codes: OT G-codes **NOT FOR INPATIENT CLASS** Functional Assessment Tool Used: Clinical judgement Functional Limitation: Self care Self Care Current Status (K4401): At least 1 percent but less than 20 percent impaired, limited or restricted Self Care Goal Status (U2725): At least 1 percent but less than 20 percent impaired, limited or restricted Self Care Discharge Status [phone]): At least 1 percent but less than 20 percent impaired, limited or restricted   Mel Almond A. Ulice Brilliant, M.S., OTR/L Pager: [place] 12/03/2016, 10:33 AM

## 2016-12-03 NOTE — Care Management Obs Status (Signed)
[place] NOTIFICATION   Patient Details  Name: Misty Barker MRN: [id number] Date of Birth: [birth year]   Medicare Observation Status Notification Given:  Yes    Ninfa Meeker, RN 12/03/2016, 10:58 AM

## 2016-12-03 NOTE — Progress Notes (Signed)
Patient is discharged from room 3C07 at this time. Alert and in stable condition. IV site d/c'd and instructions read to patient with understanding verbalized. Left unit via wheelchair with husband and all belongings at side.

## 2016-12-07 DIAGNOSIS — Z4789 Encounter for other orthopedic aftercare: Secondary | ICD-10-CM | POA: Diagnosis not present

## 2016-12-07 NOTE — Discharge Summary (Signed)
Physician Discharge Summary  [name] MRN: [id number] DOB/AGE: [birth year] 71 y.o.  Admit date: 12/02/2016 Discharge date: 12/03/16 Admission Diagnoses:  Chronic Pain Syndrome  Discharge Diagnoses:  Active Problems:   Status post insertion of spinal cord stimulator   Past Medical History:  Diagnosis Date  . Abdominal pain 02/01/2015  . Allergy    "seasonal allergies"  . Anemia   . Arthritis    osteoarthritis-"DDD" hips. fingers, toes.  . Blood transfusion without reported diagnosis   . Complication of anesthesia   . Depression   . Depression with anxiety   . Frequent headaches    no problems now.  Marland Kitchen Hearing loss in right ear 03/10/2015   same over last 6 months  . Hip dislocation, right ([place])   . History of chicken pox   . Hyperlipidemia    pt. state no hx  . Hypothyroidism    supplement used  . Osteoporosis   . Peripheral neuropathy 02/01/2015   right hand"carpal tunnel"  . PONV (postoperative nausea and vomiting)   . Right hip pain 05/24/2015  . Scoliosis 02/01/2015  . Spinal stenosis of lumbar region 10/23/2016  . Thyroid disease   . URI, acute 03/10/2015  . 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 female who was admitted 12/02/2016 with a diagnosis of Chronic pain syndrome and went to the operating room on 12/02/2016 and underwent the above named procedures.  Post op day one the pt reports low level of pain controlled on oral medication.  Pt is urinating w/o difficulty.  Pt is ambulating in hallway.  Pt way cleared by PT for DC.   She was given perioperative antibiotics:  Anti-infectives    Start     Dose/Rate Route Frequency Ordered Stop   12/02/16 1830  vancomycin (VANCOCIN) IVPB 750 mg/150 ml premix     750 mg 150 mL/hr over 60  Minutes Intravenous  Once 12/02/16 1422 12/02/16 2046   12/02/16 0715  vancomycin (VANCOCIN) IVPB 1000 mg/200 mL premix     1,000 mg 200 mL/hr over 60 Minutes Intravenous 60 min pre-op 12/01/16 1020 12/02/16 0900    .  She was given sequential compression devices, early ambulation, and TED for DVT prophylaxis.  She benefited maximally from the hospital stay and there were no complications.    Recent vital signs:  Vitals:   12/03/16 0400 12/03/16 0802  BP: 123/65 (!) 114/58  Pulse: 74 71  Resp: 18 20  Temp: 98.7 F (37.1 C) 98.3 F (36.8 C)  SpO2: 100% 100%    Recent laboratory studies:  Lab Results  Component Value Date   HGB 12.6 11/30/2016   HGB 13.0 10/22/2016   HGB 13.7 02/03/2016   Lab Results  Component Value Date   WBC 6.5 11/30/2016   PLT 265 11/30/2016   Lab Results  Component Value Date   INR 0.95 10/29/2015   Lab Results  Component Value Date   NA 137 11/30/2016   K 3.5 11/30/2016   CL 100 (L) 11/30/2016   CO2 28 11/30/2016   BUN 14 11/30/2016   CREATININE 0.74 11/30/2016   GLUCOSE 88 11/30/2016    Discharge Medications:   Allergies as of 12/03/2016      Reactions   Penicillins Hives   Childhood allergy  Has patient had a PCN reaction causing immediate rash, facial/tongue/throat swelling, SOB or lightheadedness with hypotension: No Has patient had a PCN reaction causing severe rash involving mucus membranes or skin necrosis: No Has patient had a PCN reaction that required hospitalization No Has patient had a PCN reaction occurring within the last 10 years: No If all of the above answers are "NO", then may proceed with Cephalosporin use.   Sulfa Antibiotics Hives      Medication List    STOP taking these medications   HYDROcodone-acetaminophen 7.5-325 MG tablet Commonly known as:  NORCO Replaced by:  HYDROcodone-acetaminophen 5-325 MG tablet     TAKE these medications   aspirin EC 81 MG tablet Take 81 mg by mouth daily.   b complex  vitamins tablet Take 1 tablet by mouth daily.   cetirizine 10 MG tablet Commonly known as:  ZYRTEC Take 10 mg by mouth daily.   cholecalciferol 1000 units tablet Commonly known as:  VITAMIN D Take 1,000 Units by mouth daily.   DULoxetine 60 MG capsule Commonly known as:  CYMBALTA Take 60 mg by mouth daily.   fluticasone 50 MCG/ACT nasal spray Commonly known as:  FLONASE Place 1 spray into both nostrils daily as needed for allergies or rhinitis.   furosemide 20 MG tablet Commonly known as:  LASIX Take 1 tablet (20 mg total) by mouth daily.   gabapentin 300 MG capsule Commonly known as:  NEURONTIN Take 1 capsule (300 mg total) by mouth at bedtime.   HYDROcodone-acetaminophen 5-325 MG tablet Commonly known as:  NORCO Take 1 tablet by mouth every 6 (six) hours as needed for moderate pain. Replaces:  HYDROcodone-acetaminophen 7.5-325 MG tablet   levothyroxine 50 MCG tablet Commonly known as:  SYNTHROID, LEVOTHROID Take 1 tablet (50 mcg total) by mouth daily.   liothyronine 25 MCG tablet Commonly known as:  CYTOMEL Take 1 tablet (25 mcg total) by mouth daily.   ondansetron 4 MG disintegrating tablet Commonly known as:  ZOFRAN ODT Take 1 tablet (4 mg total) by mouth every 8 (eight) hours as needed for nausea or vomiting.   ZADITOR OP Place 1 drop into both eyes 2 (two) times daily as needed (itchy eyes).            Discharge Care Instructions        Start     Ordered   12/03/16 0000  HYDROcodone-acetaminophen (NORCO) 5-325 MG tablet  Every 6 hours PRN     12/02/16 1130   12/02/16 0000  Incentive spirometry RT     12/02/16 1125   12/02/16 0000  ondansetron (ZOFRAN ODT) 4 MG disintegrating tablet  Every 8 hours PRN     12/02/16 1130      Diagnostic Studies: Dg Thoracolumabar Spine  Result Date: 12/02/2016 CLINICAL DATA:  Spinal cord stimulator placement EXAM: THORACOLUMBAR SPINE 1V COMPARISON:  None. FINDINGS: Two intraoperative spot images demonstrate  placement of spinal cord stimulator in the lower thoracic region. No complicating feature visualized. IMPRESSION: Spinal cord stimulator placement in the lower thoracic region. Electronically Signed   By: Rolm Baptise M.D.   On: 12/02/2016 10:41   Dg Abd 1 View  Result Date: 12/03/2016 CLINICAL DATA:  Abdominal pain and abdominal distention. EXAM: ABDOMEN - 1 VIEW COMPARISON:  12/03/2014 FINDINGS: Previous bilateral hip arthroplasty. There is a spinal cord stimulator with battery pack overlying the left side of pelvis. A scoliosis deformity is noted within the lumbar spine which is convex towards the right. The bowel  gas pattern appears nonobstructed. No dilated loops of small bowel identified. IMPRESSION: 1. Nonobstructive bowel gas pattern. Electronically Signed   By: Kerby Moors M.D.   On: 12/03/2016 12:01   Dg C-arm 61-120 Min  Result Date: 12/02/2016 CLINICAL DATA:  Spinal cord stimulator placement EXAM: THORACOLUMBAR SPINE 1V COMPARISON:  None. FINDINGS: Two intraoperative spot images demonstrate placement of spinal cord stimulator in the lower thoracic region. No complicating feature visualized. IMPRESSION: Spinal cord stimulator placement in the lower thoracic region. Electronically Signed   By: Rolm Baptise M.D.   On: 12/02/2016 10:41    Disposition: 01-Home or Self Care Pt will present to clinic in 2 weeks Post op medication was provided  Discharge Instructions    Incentive spirometry RT    Complete by:  As directed       Follow-up Information    Melina Schools, MD. Schedule an appointment as soon as possible for a visit in 2 week(s).   Specialty:  Orthopedic Surgery Contact information: [address] [place] [address] [phone]            Signed: Valinda Hoar 12/07/2016, 1:04 PM

## 2016-12-08 ENCOUNTER — Ambulatory Visit (INDEPENDENT_AMBULATORY_CARE_PROVIDER_SITE_OTHER): Payer: Medicare Other | Admitting: Family Medicine

## 2016-12-08 ENCOUNTER — Encounter: Payer: Self-pay | Admitting: Family Medicine

## 2016-12-08 ENCOUNTER — Ambulatory Visit: Payer: Medicare Other | Admitting: Family Medicine

## 2016-12-08 VITALS — BP 134/84 | HR 85 | Temp 98.0°F | Ht 63.0 in | Wt 103.6 lb

## 2016-12-08 DIAGNOSIS — R1030 Lower abdominal pain, unspecified: Secondary | ICD-10-CM | POA: Diagnosis not present

## 2016-12-08 DIAGNOSIS — M48061 Spinal stenosis, lumbar region without neurogenic claudication: Secondary | ICD-10-CM | POA: Diagnosis not present

## 2016-12-08 DIAGNOSIS — R300 Dysuria: Secondary | ICD-10-CM

## 2016-12-08 DIAGNOSIS — N390 Urinary tract infection, site not specified: Secondary | ICD-10-CM

## 2016-12-08 LAB — POCT URINALYSIS DIPSTICK
Bilirubin, UA: NEGATIVE
Glucose, UA: NEGATIVE
Leukocytes, UA: NEGATIVE
Nitrite, UA: NEGATIVE
PROTEIN UA: NEGATIVE
RBC UA: NEGATIVE
SPEC GRAV UA: 1.025 (ref 1.010–1.025)
UROBILINOGEN UA: 0.2 U/dL
pH, UA: 6 (ref 5.0–8.0)

## 2016-12-08 MED ORDER — CIPROFLOXACIN HCL 250 MG PO TABS: 250.0000 mg | ORAL_TABLET | Freq: Two times a day (BID) | ORAL | 0 refills | Status: DC

## 2016-12-08 NOTE — Patient Instructions (Signed)
Can take Miralax and Benefiber together once or twice daily Urinary Tract Infection, Adult A urinary tract infection (UTI) is an infection of any part of the urinary tract. The urinary tract includes the:  Kidneys.  Ureters.  Bladder.  Urethra.  These organs make, store, and get rid of pee (urine) in the body. Follow these instructions at home:  Take over-the-counter and prescription medicines only as told by your doctor.  If you were prescribed an antibiotic medicine, take it as told by your doctor. Do not stop taking the antibiotic even if you start to feel better.  Avoid the following drinks: ? Alcohol. ? Caffeine. ? Tea. ? Carbonated drinks.  Drink enough fluid to keep your pee clear or pale yellow.  Keep all follow-up visits as told by your doctor. This is important.  Make sure to: ? Empty your bladder often and completely. Do not to hold pee for long periods of time. ? Empty your bladder before and after sex. ? Wipe from front to back after a bowel movement if you are female. Use each tissue one time when you wipe. Contact a doctor if:  You have back pain.  You have a fever.  You feel sick to your stomach (nauseous).  You throw up (vomit).  Your symptoms do not get better after 3 days.  Your symptoms go away and then come back. Get help right away if:  You have very bad back pain.  You have very bad lower belly (abdominal) pain.  You are throwing up and cannot keep down any medicines or water. This information is not intended to replace advice given to you by your health care provider. Make sure you discuss any questions you have with your health care provider. Document Released: 09/16/2007 Document Revised: 09/05/2015 Document Reviewed: 02/18/2015 Elsevier Interactive Patient Education  [employer].

## 2016-12-08 NOTE — Progress Notes (Addendum)
Subjective:    [name], female    DOB: 05-17-1945, 71 y.o.   MRN: [id number]  Chief Complaint  Patient presents with  . Abdominal Pain    Patient is here today C/O lower abd pain and bloating since 8.22.18 surgical placement of spinal cord stimulator.    HPI Patient is in today for evaluation of urinary frequency, abdominal pain and bloating. She notes escalating symptoms since her spinal cord stimulator was placed. Bowels are moving and she denies any bloody or tarry stool. Notes some increased bloating. No fevers, chillls. Denies CP/palp/SOB/HA/congestion/fevers or GU c/o. Taking meds as prescribed  Past Medical History:  Diagnosis Date  . Abdominal pain 02/01/2015  . Allergy    "seasonal allergies"  . Anemia   . Arthritis    osteoarthritis-"DDD" hips. fingers, toes.  . Blood transfusion without reported diagnosis   . Complication of anesthesia   . Depression   . Depression with anxiety   . Frequent headaches    no problems now.  Marland Kitchen Hearing loss in right ear 03/10/2015   same over last 6 months  . Hip dislocation, right ([place])   . History of chicken pox   . Hyperlipidemia    pt. state no hx  . Hypothyroidism    supplement used  . Osteoporosis   . Peripheral neuropathy 02/01/2015   right hand"carpal tunnel"  . PONV (postoperative nausea and vomiting)   . Right hip pain 05/24/2015  . Scoliosis 02/01/2015  . Spinal stenosis of lumbar region 10/23/2016  . Thyroid disease   . URI, acute 03/10/2015  . UTI (urinary tract infection) 12/14/2016  . 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 ACETABULAR REVISION Right 10/30/2015   Procedure: RIGHT HIP ACETABULAR REVISION /CONSTRAINED LINER (POSTERIOR APPROACH) ;  Surgeon: Gaynelle Arabian, MD;  Location: [facility];  Service: Orthopedics;  Laterality: Right;  . BREAST SURGERY     lump removed, and breast reduction  . EYE SURGERY     lasik    2002  . HIP CLOSED REDUCTION Right 06/30/2015   Procedure: CLOSED MANIPULATION HIP;  Surgeon: Melina Schools, MD;  Location: [facility];  Service: Orthopedics;  Laterality: Right;  . JOINT REPLACEMENT     3 hips on right and 1 on left  . METACARPOPHALANGEAL JOINT CAPSULECTOMY / CAPSULOTOMY  1995  . SPINAL CORD STIMULATOR INSERTION N/A 12/02/2016   Procedure: LUMBAR SPINAL CORD STIMULATOR INSERTION;  Surgeon: Melina Schools, MD;  Location: [place];  Service: Orthopedics;  Laterality: N/A;  2.5 hrs  . TONSILLECTOMY    . TOTAL HIP ARTHROPLASTY  2011   left hip  . TOTAL HIP ARTHROPLASTY Right 06/12/2015   Procedure: RIGHT TOTAL HIP ARTHROPLASTY ANTERIOR APPROACH;  Surgeon: Gaynelle Arabian, MD;  Location: [facility];  Service: Orthopedics;  Laterality: Right;  . TOTAL HIP REVISION Right 07/19/2015   Procedure: RIGHT HIP BEARING SURFACE REVISION;  Surgeon: Gaynelle Arabian, MD;  Location: [facility];  Service: Orthopedics;  Laterality: Right;  . TUBAL LIGATION  1980   left fallopian tube removal    Family History  Problem Relation Age of Onset  . Arthritis Mother   . Dementia Mother   . Mental illness Father        suicide  . Arthritis Maternal Grandmother   . Kidney disease Son        b/l multicystic kidney disease s/p 4 transplants  .  Colon cancer Neg Hx     Social History   Social History  . Marital status: Married    Spouse name: N/A  . Number of children: N/A  . Years of education: 62   Occupational History  . retired    Social History Main Topics  . Smoking status: Never Smoker  . Smokeless tobacco: Never Used  . Alcohol use 0.6 oz/week    1 Glasses of wine per week  . Drug use: No  . 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 Not on file   Social History Narrative  . No narrative on file    Outpatient Medications Prior to Visit  Medication Sig Dispense Refill  . aspirin EC 81 MG tablet  Take 81 mg by mouth daily.    Marland Kitchen b complex vitamins tablet Take 1 tablet by mouth daily.    . cetirizine (ZYRTEC) 10 MG tablet Take 10 mg by mouth daily.     . cholecalciferol (VITAMIN D) 1000 units tablet Take 1,000 Units by mouth daily.    . DULoxetine (CYMBALTA) 60 MG capsule Take 60 mg by mouth daily.    . fluticasone (FLONASE) 50 MCG/ACT nasal spray Place 1 spray into both nostrils daily as needed for allergies or rhinitis.    . furosemide (LASIX) 20 MG tablet Take 1 tablet (20 mg total) by mouth daily. 90 tablet 3  . gabapentin (NEURONTIN) 300 MG capsule Take 1 capsule (300 mg total) by mouth at bedtime. 90 capsule 3  . HYDROcodone-acetaminophen (NORCO) 5-325 MG tablet Take 1 tablet by mouth every 6 (six) hours as needed for moderate pain. 28 tablet 0  . Ketotifen Fumarate (ZADITOR OP) Place 1 drop into both eyes 2 (two) times daily as needed (itchy eyes).    Marland Kitchen levothyroxine (SYNTHROID, LEVOTHROID) 50 MCG tablet Take 1 tablet (50 mcg total) by mouth daily. 90 tablet 3  . liothyronine (CYTOMEL) 25 MCG tablet Take 1 tablet (25 mcg total) by mouth daily. 90 tablet 3  . ondansetron (ZOFRAN ODT) 4 MG disintegrating tablet Take 1 tablet (4 mg total) by mouth every 8 (eight) hours as needed for nausea or vomiting. 20 tablet 0   No facility-administered medications prior to visit.     Allergies  Allergen Reactions  . Penicillins Hives    Childhood allergy  Has patient had a PCN reaction causing immediate rash, facial/tongue/throat swelling, SOB or lightheadedness with hypotension: No Has patient had a PCN reaction causing severe rash involving mucus membranes or skin necrosis: No Has patient had a PCN reaction that required hospitalization No Has patient had a PCN reaction occurring within the last 10 years: No If all of the above answers are "NO", then may proceed with Cephalosporin use.  . Sulfa Antibiotics Hives    ROS     Objective:    Physical Exam  Constitutional: She is  oriented to person, place, and time. She appears well-developed and well-nourished. No distress.  frail  HENT:  Head: Normocephalic and atraumatic.  Eyes: Conjunctivae are normal.  Neck: Neck supple. No thyromegaly present.  Cardiovascular: Normal rate, regular rhythm and normal heart sounds.   No murmur heard. Pulmonary/Chest: Effort normal and breath sounds normal. No respiratory distress.  Abdominal: Soft. Bowel sounds are normal. She exhibits no distension and no mass. There is no tenderness.  Musculoskeletal: She exhibits no edema.  Lymphadenopathy:  She has no cervical adenopathy.  Neurological: She is alert and oriented to person, place, and time.  Skin: Skin is warm and dry.  Psychiatric: She has a normal mood and affect. Her behavior is normal.    BP 134/84 (BP Location: Right Arm, Patient Position: Sitting, Cuff Size: Normal)   Pulse 85   Temp 98 F (36.7 C) (Oral)   Ht 5\' 3"  (1.6 m)   Wt 103 lb 9.6 oz (47 kg)   SpO2 97%   BMI 18.35 kg/m  Wt Readings from Last 3 Encounters:  12/08/16 103 lb 9.6 oz (47 kg)  12/02/16 100 lb (45.4 kg)  11/30/16 100 lb 6.4 oz (45.5 kg)     Lab Results  Component Value Date   WBC 6.7 12/08/2016   HGB 12.5 12/08/2016   HCT 37.8 12/08/2016   PLT 323.0 12/08/2016   GLUCOSE 70 12/08/2016   CHOL 207 (H) 10/22/2016   TRIG 70.0 10/22/2016   HDL 70.20 10/22/2016   LDLCALC 122 (H) 10/22/2016   ALT 12 12/08/2016   AST 20 12/08/2016   NA 136 12/08/2016   K 3.4 (L) 12/08/2016   CL 94 (L) 12/08/2016   CREATININE 0.68 12/08/2016   BUN 10 12/08/2016   CO2 34 (H) 12/08/2016   TSH <0.01 (L) 10/22/2016   INR 0.95 10/29/2015    Lab Results  Component Value Date   TSH <0.01 (L) 10/22/2016   Lab Results  Component Value Date   WBC 6.7 12/08/2016   HGB 12.5 12/08/2016   HCT 37.8 12/08/2016   MCV 83.6 12/08/2016   PLT 323.0 12/08/2016   Lab Results  Component Value Date   NA 136 12/08/2016   K 3.4 (L) 12/08/2016   CO2 34 (H)  12/08/2016   GLUCOSE 70 12/08/2016   BUN 10 12/08/2016   CREATININE 0.68 12/08/2016   BILITOT 0.4 12/08/2016   ALKPHOS 47 12/08/2016   AST 20 12/08/2016   ALT 12 12/08/2016   PROT 6.7 12/08/2016   ALBUMIN 4.0 12/08/2016   CALCIUM 10.2 12/08/2016   ANIONGAP 9 11/30/2016   GFR 90.24 12/08/2016   Lab Results  Component Value Date   CHOL 207 (H) 10/22/2016   Lab Results  Component Value Date   HDL 70.20 10/22/2016   Lab Results  Component Value Date   LDLCALC 122 (H) 10/22/2016   Lab Results  Component Value Date   TRIG 70.0 10/22/2016   Lab Results  Component Value Date   CHOLHDL 3 10/22/2016   No results found for: HGBA1C     Assessment & Plan:   Problem List Items Addressed This Visit    Abdominal pain - Primary    Has been increased in past week or two since spinal cord stimulator was placed. Proceed with imaging and seek care if worsens.       Relevant Orders   POCT urinalysis dipstick (Completed)   Urine Culture (Completed)   Sedimentation rate (Completed)   CBC with Differential/Platelet (Completed)   Comprehensive metabolic panel (Completed)   Spinal stenosis of lumbar region    Under went placement of spinal cord stimulator and is still struggling with pain, she has follow up with surgeon soon.       UTI (urinary tract infection)    Urine culture confirms Klebsiella UTI, treated with probiotics and antibiotics. Hydrate well.        Other Visit Diagnoses    Dysuria       Relevant Orders   CBC  with Differential/Platelet (Completed)      I have discontinued Ms. Vogt-Nichols's ondansetron. I am also having her start on ciprofloxacin. Additionally, I am having her maintain her cetirizine, liothyronine, levothyroxine, gabapentin, furosemide, aspirin EC, b complex vitamins, cholecalciferol, DULoxetine, fluticasone, and Ketotifen Fumarate (ZADITOR OP).  Meds ordered this encounter  Medications  . ciprofloxacin (CIPRO) 250 MG tablet    Sig: Take 1  tablet (250 mg total) by mouth 2 (two) times daily.    Dispense:  20 tablet    Refill:  0     Penni Homans, MD

## 2016-12-09 ENCOUNTER — Telehealth: Payer: Self-pay | Admitting: Family Medicine

## 2016-12-09 ENCOUNTER — Other Ambulatory Visit: Payer: Self-pay | Admitting: Family Medicine

## 2016-12-09 DIAGNOSIS — D72829 Elevated white blood cell count, unspecified: Secondary | ICD-10-CM

## 2016-12-09 DIAGNOSIS — R1084 Generalized abdominal pain: Secondary | ICD-10-CM

## 2016-12-09 LAB — COMPREHENSIVE METABOLIC PANEL
ALK PHOS: 47 U/L (ref 39–117)
ALT: 12 U/L (ref 0–35)
AST: 20 U/L (ref 0–37)
Albumin: 4 g/dL (ref 3.5–5.2)
BUN: 10 mg/dL (ref 6–23)
CO2: 34 meq/L — AB (ref 19–32)
Calcium: 10.2 mg/dL (ref 8.4–10.5)
Chloride: 94 mEq/L — ABNORMAL LOW (ref 96–112)
Creatinine, Ser: 0.68 mg/dL (ref 0.40–1.20)
GFR: 90.24 mL/min (ref >60.00)
GLUCOSE: 70 mg/dL (ref 70–99)
POTASSIUM: 3.4 meq/L — AB (ref 3.5–5.1)
Sodium: 136 mEq/L (ref 135–145)
Total Bilirubin: 0.4 mg/dL (ref 0.2–1.2)
Total Protein: 6.7 g/dL (ref 6.0–8.3)

## 2016-12-09 LAB — CBC WITH DIFFERENTIAL/PLATELET
BASOS PCT: 1.2 % (ref 0.0–3.0)
Basophils Absolute: 0.1 10*3/uL (ref 0.0–0.1)
EOS PCT: 8.3 % — AB (ref 0.0–5.0)
Eosinophils Absolute: 0.6 10*3/uL (ref 0.0–0.7)
HCT: 37.8 % (ref 36.0–46.0)
Hemoglobin: 12.5 g/dL (ref 12.0–15.0)
LYMPHS PCT: 27.4 % (ref 12.0–46.0)
Lymphs Abs: 1.8 10*3/uL (ref 0.7–4.0)
MCHC: 32.9 g/dL (ref 30.0–36.0)
MCV: 83.6 fl (ref 78.0–100.0)
MONO ABS: 0.8 10*3/uL (ref 0.1–1.0)
MONOS PCT: 12.7 % — AB (ref 3.0–12.0)
NEUTROS PCT: 50.4 % (ref 43.0–77.0)
Neutro Abs: 3.4 10*3/uL (ref 1.4–7.7)
PLATELETS: 323 10*3/uL (ref 150.0–400.0)
RBC: 4.52 Mil/uL (ref 3.87–5.11)
RDW: 13.8 % (ref 11.5–15.5)
WBC: 6.7 10*3/uL (ref 4.0–10.5)

## 2016-12-09 LAB — SEDIMENTATION RATE: SED RATE: 41 mm/h — AB (ref 0–30)

## 2016-12-09 NOTE — Progress Notes (Unsigned)
Pre visit review using our clinic tool,if applicable. No additional management support is needed unless otherwise documented below in the visit note.  

## 2016-12-09 NOTE — Telephone Encounter (Signed)
°  Relation to YW:VPXT Call back number:[phone]  Reason for call:  Patient wanted to inform PCP she agrees to have repeat lab work and ct scan, please advise

## 2016-12-09 NOTE — Telephone Encounter (Signed)
FYI

## 2016-12-10 ENCOUNTER — Encounter (HOSPITAL_BASED_OUTPATIENT_CLINIC_OR_DEPARTMENT_OTHER): Payer: Self-pay

## 2016-12-10 ENCOUNTER — Ambulatory Visit (HOSPITAL_BASED_OUTPATIENT_CLINIC_OR_DEPARTMENT_OTHER)
Admission: RE | Admit: 2016-12-10 | Discharge: 2016-12-10 | Disposition: A | Discharge status: Home / Self Care | Payer: Medicare Other | Source: Ambulatory Visit | Attending: Family Medicine | Admitting: Family Medicine

## 2016-12-10 DIAGNOSIS — I700 Atherosclerosis of aorta: Secondary | ICD-10-CM | POA: Diagnosis not present

## 2016-12-10 DIAGNOSIS — R1084 Generalized abdominal pain: Secondary | ICD-10-CM | POA: Diagnosis not present

## 2016-12-10 DIAGNOSIS — R109 Unspecified abdominal pain: Secondary | ICD-10-CM | POA: Diagnosis not present

## 2016-12-10 DIAGNOSIS — R140 Abdominal distension (gaseous): Secondary | ICD-10-CM | POA: Diagnosis not present

## 2016-12-10 MED ORDER — IOPAMIDOL (ISOVUE-300) INJECTION 61%
100.0000 mL | Freq: Once | INTRAVENOUS | Status: AC | PRN
  Administered 2016-12-10: 100 mL via INTRAVENOUS

## 2016-12-11 LAB — URINE CULTURE

## 2016-12-14 ENCOUNTER — Encounter: Payer: Self-pay | Admitting: Family Medicine

## 2016-12-14 DIAGNOSIS — N390 Urinary tract infection, site not specified: Secondary | ICD-10-CM

## 2016-12-14 HISTORY — DX: Urinary tract infection, site not specified: N39.0

## 2016-12-14 NOTE — Assessment & Plan Note (Signed)
Urine culture confirms Klebsiella UTI, treated with probiotics and antibiotics. Hydrate well.

## 2016-12-14 NOTE — Assessment & Plan Note (Signed)
Has been increased in past week or two since spinal cord stimulator was placed. Proceed with imaging and seek care if worsens.

## 2016-12-14 NOTE — Assessment & Plan Note (Signed)
Under went placement of spinal cord stimulator and is still struggling with pain, she has follow up with surgeon soon.

## 2016-12-15 DIAGNOSIS — M5442 Lumbago with sciatica, left side: Secondary | ICD-10-CM | POA: Diagnosis not present

## 2016-12-15 DIAGNOSIS — G894 Chronic pain syndrome: Secondary | ICD-10-CM | POA: Diagnosis not present

## 2016-12-15 DIAGNOSIS — Z4789 Encounter for other orthopedic aftercare: Secondary | ICD-10-CM | POA: Diagnosis not present

## 2016-12-25 DIAGNOSIS — Z4789 Encounter for other orthopedic aftercare: Secondary | ICD-10-CM | POA: Diagnosis not present

## 2016-12-29 ENCOUNTER — Ambulatory Visit: Payer: Self-pay | Admitting: Physician Assistant

## 2016-12-30 ENCOUNTER — Encounter (HOSPITAL_COMMUNITY): Payer: Self-pay | Admitting: *Deleted

## 2016-12-30 NOTE — Progress Notes (Signed)
Anesthesia made aware of consult.

## 2016-12-30 NOTE — Progress Notes (Signed)
Pt denies SOB, chest pain, and being under the care of a cardiologist. Pt denies having a cardiac cath but stated that both a stress test and echo were performed > 10 years ago. Pt denies having a chest x ray within the last year. Pt denies recent labs. Pt made aware to stop taking Aspirin, vitamins, fish oil and herbal medications. Do not take any NSAIDs ie: Ibuprofen, Advil, Naproxen (Aleve), Motrin, BC and goody Powder or any medication containing Aspirin. Pt verbalized understanding of all pre-op instructions.

## 2016-12-31 ENCOUNTER — Ambulatory Visit (HOSPITAL_COMMUNITY)
Admission: RE | Admit: 2016-12-31 | Discharge: 2016-12-31 | Disposition: A | Discharge status: Home / Self Care | Payer: Medicare Other | Source: Ambulatory Visit | Attending: Orthopedic Surgery | Admitting: Orthopedic Surgery

## 2016-12-31 ENCOUNTER — Encounter (HOSPITAL_COMMUNITY): Payer: Self-pay | Admitting: *Deleted

## 2016-12-31 ENCOUNTER — Encounter (HOSPITAL_COMMUNITY)
Admission: RE | Disposition: A | Discharge status: Home / Self Care | Payer: Self-pay | Source: Ambulatory Visit | Attending: Orthopedic Surgery

## 2016-12-31 ENCOUNTER — Ambulatory Visit (HOSPITAL_COMMUNITY): Payer: Medicare Other | Admitting: Emergency Medicine

## 2016-12-31 DIAGNOSIS — M48062 Spinal stenosis, lumbar region with neurogenic claudication: Secondary | ICD-10-CM | POA: Insufficient documentation

## 2016-12-31 DIAGNOSIS — Z7982 Long term (current) use of aspirin: Secondary | ICD-10-CM | POA: Insufficient documentation

## 2016-12-31 DIAGNOSIS — T85898A Other specified complication of other internal prosthetic devices, implants and grafts, initial encounter: Secondary | ICD-10-CM | POA: Diagnosis not present

## 2016-12-31 DIAGNOSIS — Z4549 Encounter for adjustment and management of other implanted nervous system device: Secondary | ICD-10-CM | POA: Diagnosis not present

## 2016-12-31 DIAGNOSIS — M48061 Spinal stenosis, lumbar region without neurogenic claudication: Secondary | ICD-10-CM | POA: Diagnosis not present

## 2016-12-31 DIAGNOSIS — F329 Major depressive disorder, single episode, unspecified: Secondary | ICD-10-CM | POA: Insufficient documentation

## 2016-12-31 DIAGNOSIS — F419 Anxiety disorder, unspecified: Secondary | ICD-10-CM | POA: Diagnosis not present

## 2016-12-31 DIAGNOSIS — Z96641 Presence of right artificial hip joint: Secondary | ICD-10-CM | POA: Diagnosis not present

## 2016-12-31 DIAGNOSIS — T85840A Pain due to nervous system prosthetic devices, implants and grafts, initial encounter: Secondary | ICD-10-CM | POA: Diagnosis not present

## 2016-12-31 DIAGNOSIS — E039 Hypothyroidism, unspecified: Secondary | ICD-10-CM | POA: Diagnosis not present

## 2016-12-31 DIAGNOSIS — Z79899 Other long term (current) drug therapy: Secondary | ICD-10-CM | POA: Insufficient documentation

## 2016-12-31 DIAGNOSIS — N390 Urinary tract infection, site not specified: Secondary | ICD-10-CM | POA: Diagnosis not present

## 2016-12-31 DIAGNOSIS — Y798 Miscellaneous orthopedic devices associated with adverse incidents, not elsewhere classified: Secondary | ICD-10-CM | POA: Insufficient documentation

## 2016-12-31 DIAGNOSIS — M1990 Unspecified osteoarthritis, unspecified site: Secondary | ICD-10-CM | POA: Insufficient documentation

## 2016-12-31 DIAGNOSIS — G894 Chronic pain syndrome: Secondary | ICD-10-CM | POA: Diagnosis not present

## 2016-12-31 DIAGNOSIS — Z472 Encounter for removal of internal fixation device: Secondary | ICD-10-CM | POA: Diagnosis not present

## 2016-12-31 DIAGNOSIS — T85848A Pain due to other internal prosthetic devices, implants and grafts, initial encounter: Secondary | ICD-10-CM | POA: Diagnosis not present

## 2016-12-31 HISTORY — PX: SPINAL CORD STIMULATOR REMOVAL: SHX5379

## 2016-12-31 LAB — CBC
HCT: 39.9 % (ref 36.0–46.0)
HEMOGLOBIN: 12.9 g/dL (ref 12.0–15.0)
MCH: 27 pg (ref 26.0–34.0)
MCHC: 32.3 g/dL (ref 30.0–36.0)
MCV: 83.6 fL (ref 78.0–100.0)
PLATELETS: 227 10*3/uL (ref 150–400)
RBC: 4.77 MIL/uL (ref 3.87–5.11)
RDW: 14.3 % (ref 11.5–15.5)
WBC: 6.4 10*3/uL (ref 4.0–10.5)

## 2016-12-31 SURGERY — LUMBAR SPINAL CORD STIMULATOR REMOVAL
Anesthesia: General | Site: Spine Lumbar

## 2016-12-31 MED ORDER — GLYCOPYRROLATE 0.2 MG/ML IJ SOLN
INTRAMUSCULAR | Status: DC | PRN
  Administered 2016-12-31: 0.2 mg via INTRAVENOUS

## 2016-12-31 MED ORDER — MORPHINE SULFATE (PF) 4 MG/ML IV SOLN
INTRAVENOUS | Status: AC
  Filled 2016-12-31: qty 1

## 2016-12-31 MED ORDER — SUCCINYLCHOLINE CHLORIDE 200 MG/10ML IV SOSY
PREFILLED_SYRINGE | INTRAVENOUS | Status: AC
  Filled 2016-12-31: qty 10

## 2016-12-31 MED ORDER — FENTANYL CITRATE (PF) 250 MCG/5ML IJ SOLN
INTRAMUSCULAR | Status: DC | PRN
  Administered 2016-12-31: 25 ug via INTRAVENOUS
  Administered 2016-12-31: 50 ug via INTRAVENOUS

## 2016-12-31 MED ORDER — SUCCINYLCHOLINE CHLORIDE 20 MG/ML IJ SOLN
INTRAMUSCULAR | Status: DC | PRN
  Administered 2016-12-31: 100 mg via INTRAVENOUS

## 2016-12-31 MED ORDER — LACTATED RINGERS IV SOLN
INTRAVENOUS | Status: DC
  Administered 2016-12-31: 09:00:00 via INTRAVENOUS

## 2016-12-31 MED ORDER — OXYCODONE HCL 5 MG/5ML PO SOLN: 5.0000 mg | Freq: Once | ORAL | Status: AC | PRN

## 2016-12-31 MED ORDER — BUPIVACAINE-EPINEPHRINE (PF) 0.25% -1:200000 IJ SOLN
INTRAMUSCULAR | Status: AC
  Filled 2016-12-31: qty 30

## 2016-12-31 MED ORDER — MORPHINE SULFATE (PF) 4 MG/ML IV SOLN
INTRAVENOUS | Status: AC
Start: 2016-12-31 — End: 2016-12-31
  Administered 2016-12-31: 2 mg via INTRAVENOUS
  Filled 2016-12-31: qty 1

## 2016-12-31 MED ORDER — ACETAMINOPHEN 10 MG/ML IV SOLN
INTRAVENOUS | Status: DC | PRN
  Administered 2016-12-31: 500 mg via INTRAVENOUS

## 2016-12-31 MED ORDER — OXYCODONE HCL 5 MG PO TABS
ORAL_TABLET | ORAL | Status: AC
  Administered 2016-12-31: 5 mg via ORAL
  Filled 2016-12-31: qty 1

## 2016-12-31 MED ORDER — ONDANSETRON HCL 4 MG/2ML IJ SOLN
INTRAMUSCULAR | Status: AC
  Filled 2016-12-31: qty 2

## 2016-12-31 MED ORDER — MORPHINE SULFATE (PF) 4 MG/ML IV SOLN
1.0000 mg | INTRAVENOUS | Status: DC | PRN
  Administered 2016-12-31 (×3): 2 mg via INTRAVENOUS

## 2016-12-31 MED ORDER — LIDOCAINE HCL (CARDIAC) 20 MG/ML IV SOLN
INTRAVENOUS | Status: DC | PRN
  Administered 2016-12-31: 100 mg via INTRATRACHEAL

## 2016-12-31 MED ORDER — PROPOFOL 10 MG/ML IV BOLUS
INTRAVENOUS | Status: DC | PRN
  Administered 2016-12-31 (×2): 100 mg via INTRAVENOUS

## 2016-12-31 MED ORDER — PROPOFOL 10 MG/ML IV BOLUS
INTRAVENOUS | Status: AC
  Filled 2016-12-31: qty 20

## 2016-12-31 MED ORDER — METOPROLOL TARTARATE 1 MG/ML SYRINGE (5ML)
Status: DC | PRN
  Administered 2016-12-31: 1 mg via INTRAVENOUS

## 2016-12-31 MED ORDER — 0.9 % SODIUM CHLORIDE (POUR BTL) OPTIME
TOPICAL | Status: DC | PRN
  Administered 2016-12-31: 1000 mL

## 2016-12-31 MED ORDER — OXYCODONE HCL 5 MG PO TABS
5.0000 mg | ORAL_TABLET | Freq: Once | ORAL | Status: AC | PRN
  Administered 2016-12-31: 5 mg via ORAL

## 2016-12-31 MED ORDER — HYDROCODONE-ACETAMINOPHEN 10-325 MG PO TABS: 1.0000 | ORAL_TABLET | ORAL | 0 refills | Status: DC | PRN

## 2016-12-31 MED ORDER — DEXAMETHASONE SODIUM PHOSPHATE 10 MG/ML IJ SOLN
INTRAMUSCULAR | Status: DC | PRN
  Administered 2016-12-31: 5 mg via INTRAVENOUS

## 2016-12-31 MED ORDER — ACETAMINOPHEN 10 MG/ML IV SOLN
INTRAVENOUS | Status: AC
  Filled 2016-12-31: qty 200

## 2016-12-31 MED ORDER — ARTIFICIAL TEARS OPHTHALMIC OINT
TOPICAL_OINTMENT | OPHTHALMIC | Status: AC
  Filled 2016-12-31: qty 3.5

## 2016-12-31 MED ORDER — VANCOMYCIN HCL IN DEXTROSE 1-5 GM/200ML-% IV SOLN
1000.0000 mg | INTRAVENOUS | Status: AC
  Administered 2016-12-31: 1000 mg via INTRAVENOUS
  Filled 2016-12-31: qty 200

## 2016-12-31 MED ORDER — THROMBIN 20000 UNITS EX SOLR
CUTANEOUS | Status: AC
  Filled 2016-12-31: qty 20000

## 2016-12-31 MED ORDER — ONDANSETRON HCL 4 MG/2ML IJ SOLN
INTRAMUSCULAR | Status: DC | PRN
  Administered 2016-12-31: 4 mg via INTRAVENOUS

## 2016-12-31 MED ORDER — BUPIVACAINE-EPINEPHRINE 0.25% -1:200000 IJ SOLN
INTRAMUSCULAR | Status: DC | PRN
  Administered 2016-12-31: 30 mL

## 2016-12-31 MED ORDER — BUPIVACAINE LIPOSOME 1.3 % IJ SUSP
20.0000 mL | INTRAMUSCULAR | Status: AC
  Administered 2016-12-31: 20 mL
  Filled 2016-12-31: qty 20

## 2016-12-31 MED ORDER — LIDOCAINE 2% (20 MG/ML) 5 ML SYRINGE
INTRAMUSCULAR | Status: AC
  Filled 2016-12-31: qty 5

## 2016-12-31 MED ORDER — FENTANYL CITRATE (PF) 250 MCG/5ML IJ SOLN
INTRAMUSCULAR | Status: AC
  Filled 2016-12-31: qty 5

## 2016-12-31 MED ORDER — PHENYLEPHRINE HCL 10 MG/ML IJ SOLN
INTRAMUSCULAR | Status: DC | PRN
  Administered 2016-12-31: 80 ug via INTRAVENOUS

## 2016-12-31 MED ORDER — ARTIFICIAL TEARS OPHTHALMIC OINT
TOPICAL_OINTMENT | OPHTHALMIC | Status: DC | PRN
  Administered 2016-12-31: 1 via OPHTHALMIC

## 2016-12-31 SURGICAL SUPPLY — 46 items
CANISTER SUCT 3000ML PPV (MISCELLANEOUS) ×3 IMPLANT
CLOSURE STERI-STRIP 1/2X4 (GAUZE/BANDAGES/DRESSINGS) ×2
CLSR STERI-STRIP ANTIMIC 1/2X4 (GAUZE/BANDAGES/DRESSINGS) ×3 IMPLANT
DRAPE INCISE IOBAN 85X60 (DRAPES) ×3 IMPLANT
DRAPE SURG 17X23 STRL (DRAPES) ×3 IMPLANT
DRAPE U-SHAPE 47X51 STRL (DRAPES) ×3 IMPLANT
DRESSING ALLEVYN LIFE SACRUM (GAUZE/BANDAGES/DRESSINGS) ×2 IMPLANT
DRSG AQUACEL AG ADV 3.5X 4 (GAUZE/BANDAGES/DRESSINGS) ×6 IMPLANT
DRSG OPSITE POSTOP 4X6 (GAUZE/BANDAGES/DRESSINGS) ×2 IMPLANT
DRSG OPSITE POSTOP 4X8 (GAUZE/BANDAGES/DRESSINGS) ×2 IMPLANT
DURAPREP 26ML APPLICATOR (WOUND CARE) ×3 IMPLANT
ELECT CAUTERY BLADE 6.4 (BLADE) ×3 IMPLANT
ELECT PENCIL ROCKER SW 15FT (MISCELLANEOUS) ×3 IMPLANT
ELECT REM PT RETURN 9FT ADLT (ELECTROSURGICAL) ×3
ELECTRODE REM PT RTRN 9FT ADLT (ELECTROSURGICAL) ×1 IMPLANT
GLOVE BIO SURGEON STRL SZ 6.5 (GLOVE) ×2 IMPLANT
GLOVE BIO SURGEONS STRL SZ 6.5 (GLOVE) ×1
GLOVE BIOGEL PI IND STRL 6.5 (GLOVE) ×1 IMPLANT
GLOVE BIOGEL PI IND STRL 8.5 (GLOVE) ×1 IMPLANT
GLOVE BIOGEL PI INDICATOR 6.5 (GLOVE) ×2
GLOVE BIOGEL PI INDICATOR 8.5 (GLOVE) ×2
GLOVE SS BIOGEL STRL SZ 8.5 (GLOVE) ×2 IMPLANT
GLOVE SUPERSENSE BIOGEL SZ 8.5 (GLOVE) ×4
GOWN STRL REUS W/ TWL XL LVL3 (GOWN DISPOSABLE) ×2 IMPLANT
GOWN STRL REUS W/TWL 2XL LVL3 (GOWN DISPOSABLE) ×6 IMPLANT
GOWN STRL REUS W/TWL XL LVL3 (GOWN DISPOSABLE) ×6
KIT BASIN OR (CUSTOM PROCEDURE TRAY) ×3 IMPLANT
KIT ROOM TURNOVER OR (KITS) ×3 IMPLANT
NEEDLE 22X1 1/2 (OR ONLY) (NEEDLE) ×3 IMPLANT
NS IRRIG 1000ML POUR BTL (IV SOLUTION) ×3 IMPLANT
PACK LAMINECTOMY ORTHO (CUSTOM PROCEDURE TRAY) ×3 IMPLANT
PACK UNIVERSAL I (CUSTOM PROCEDURE TRAY) ×3 IMPLANT
SPONGE LAP 4X18 X RAY DECT (DISPOSABLE) IMPLANT
STAPLER VISISTAT 35W (STAPLE) ×3 IMPLANT
SURGIFLO W/THROMBIN 8M KIT (HEMOSTASIS) IMPLANT
SUT BONE WAX W31G (SUTURE) ×3 IMPLANT
SUT MON AB 3-0 SH 27 (SUTURE) ×6
SUT MON AB 3-0 SH27 (SUTURE) ×2 IMPLANT
SUT VIC AB 1 CT1 27 (SUTURE) ×9
SUT VIC AB 1 CT1 27XBRD ANBCTR (SUTURE) ×3 IMPLANT
SUT VIC AB 2-0 CT1 18 (SUTURE) ×6 IMPLANT
SYR BULB IRRIGATION 50ML (SYRINGE) ×3 IMPLANT
SYR CONTROL 10ML LL (SYRINGE) ×3 IMPLANT
TOWEL OR 17X24 6PK STRL BLUE (TOWEL DISPOSABLE) ×3 IMPLANT
TOWEL OR 17X26 10 PK STRL BLUE (TOWEL DISPOSABLE) ×3 IMPLANT
WATER STERILE IRR 1000ML POUR (IV SOLUTION) ×3 IMPLANT

## 2016-12-31 NOTE — H&P (Signed)
History of Present Illness  The patient is a 71 year old female who presents for a Follow-up for Back complaints. The patient is seen today for post-operative follow-up. The patient reports pain (incisional and back pain), weakness (right leg) and decreased range of motion which began year(s) ago without any known injury.They are 3 week(s) out from SCS implant 12-02-16. The patient describes their pain as rating it as 5 / 10 on a ten-point analog pain scale and feels as if they are unchanged. Current treatment includes opioid analgesics (Oxycodone 5 mg from [facility]- Dr Nelva Bush).   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 SulfADIAZINE *Sulfonamides  Hives.  Family History Kidney disease  child Osteoarthritis  mother, grandmother mothers side and grandfather mothers side Rheumatoid Arthritis  grandfather mothers side Father  Deceased. age 67 Mother  Deceased. age 60  Social History  Tobacco / smoke exposure  no Tobacco use  never smoker Alcohol use  current drinker; drinks wine; only occasionally per week Children  2 Current work status  retired Engineer, agricultural (Currently)  no Drug/Alcohol Rehab (Previously)  no Exercise  Exercises daily; does running / walking and gym / weights Illicit drug use  no Living situation  live with spouse Marital status  married Number of flights of stairs before winded  2-3 Pain  Contract  yes  Medication History Cymbalta (60MG  Capsule DR Part, 1 (one) Capsule Oral daily, Taken starting 09/14/2016) Active. OxyCODONE HCl (5MG  Tablet, 1 (one) Oral PO QID PRN SEVERE PAIN, Taken starting 12/17/2016) Active. Gabapentin (100MG  Capsule, 1 (one) Capsule Oral PO BID, Taken starting 12/17/2016) Active. Aspirin (81MG  Tablet DR, Oral) Active. (qd) Levothyroxine Sodium (50MCG Tablet, Oral) Active. (qd) Cytomel (Oral) Specific strength unknown - Active. Liothyronine Sodium (25MCG Tablet, Oral daily) Active. Vitamin B Complex (Oral daily) Active. Vitamin D (2000UNIT Capsule, Oral daily) Active. Calcium Carbonate (1250 (500 Ca)MG Capsule, Oral) Active. (qd) ZyrTEC (10MG  Tablet, Oral daily) Active. Furosemide (20MG  Tablet, Oral daily) Active. (qd) Medications Reconciled  Pregnancy / Birth History Pregnant  no  Past Surgical History Tonsillectomy  Total Hip Replacement - Right [06/12/2015]: Other Orthopaedic Surgery  Total Hip Replacement  left  Other Problems  Chronic Pain  Depression  Hypothyroidism  Osteoarthritis  Varicose veins  Degenerative Disc Disease  Measles  Menopause  Weakness of right hip (R29.898)    Note:clinical exam. She continues to have horrific thoracic pain that radiates primarily to the right side of her back. She states that this has not significantly improved over the last 3-1/2 weeks.  Negative Babinski test. Normal gait pattern. No localizing motor or sensory deficits in the lower extremity. No pain radiating in the thoracic radicular dermatome. The incision site is clean dry and intact no significant tenderness with direct palpation over the incision sites. She has pain with palpation primarily over the right thoracic and lumbar spine posterior lateral. She has pain in the lumbar spine but no radiation into the legs.  Compartments are soft and nontender. Intact peripheral pulses  throughout.  Impression. CT scan was  completed of the abdomen was essentially unremarkable. The patient's clinical exam does not demonstrate any evidence of myelopathy, or thoracic radiculopathy. Given the lack of any focal neurological deficits and the unremarkable CT scan I am at a loss as to the etiology of this debilitating pain. Approximately a week ago we reprogrammed the stimulator with hopes that that would improve her overall pain but it has not. At this point we have again discussed potentially removing the device. I did emphasize that there is some urgency to that from a technical standpoint. I will also contact Dr. Prudy Feeler and have him discuss the different pain management regimen to address the acute severe postoperative pain that she is in.  I've explained to the patient and her husband that even with explantation of both leads in the battery she may continue to have pain. At this point the goal of surgery is to remove the device at her request in an attempt to eliminate her severe pain. All the risks and benefits were discussed with the patient and her husband. These include infection, bleeding, death, stroke, paralysis, ongoing or worse pain. Inability to remove the device, CSF leak. Need for additional surgery.  Goal of surgery is reduction in pain and improvement in quality of life.

## 2016-12-31 NOTE — Op Note (Signed)
Operative report.  Preoperative diagnosis. Symptomatic hardware.  Postoperative diagnosis. Same.  Operative procedure. Removal of spinal cord stimulator paddle and battery.  Complications. None. Operative findings. Successful explantation of spinal cord stimulator paddle and battery. No CSF leak noted. No signs or symptoms of infection noted.  Indications. This is a very pleasant 71 year old woman with chronic pain syndrome who approximately 3 1/2 weeks ago had a 6 spinal cord stimulator permanent implantation. Stop her patient complained of severe thoracic and lateral lumbar pain. Despite programming the spinal cord stimulator the patient's pain continued to worsen. As a result we discussed explantation. The patient and her husband elected to move forward with that.  Operative note. Patient was brought the operating room placed supine the operating room table. After successful induction of general anesthesia and endotracheal intubation teds SCDs were applied she was turned prone onto a gel roll. The arms were tucked at the side and the back was prepped and draped in a standard fashion. Timeout was then taken to confirm patient , procedure, and all other important data.  The previous incision in the thoracic spine was re-incised and I sharply dissected down to the deep fascia. I was able to bluntly dissect through the deep fascia and expose the spinal process and lamina. I identified the spinal cord stimulator leads. Using a Penfield 4, and a small nerve curette I remove the overlying scar tissue to expose the thecal sac as well as the leads. I then gently removed the leads. There was no undue tension. The leads himself freely came out. I then examined the wound was no evidence of CSF leak. The thecal sac did not decrease in volume. At this point I clipped the paddle lead surgery could be removed. I then re-incised the battery site dissected sharply down to the battery and remove the battery along with  the remaining portion of the leads. At this point I copiously irrigated the wounds with normal saline. Hemostasis is obtained using bipolar cautery.  Both wounds were closed with interrupted #1 Vicryl suture, 2-0 Vicryl suture, and 3-0 Monocryl. Steri-Strips and a bulky dry dressing were applied. Patient was ultimately extubated transferred the PACU that incident. At the end of the case all needle sponge counts were correct. There are no adverse intraoperative events.  Patient will be discharged home later today with appropriate medications. She'll follow-up with me in 2 weeks for wound evaluation. She'll keep the bandages on until reevaluated.

## 2016-12-31 NOTE — Anesthesia Procedure Notes (Signed)
Procedure Name: Intubation Date/Time: 12/31/2016 10:45 AM Performed by: Rica Koyanagi Pre-anesthesia Checklist: Patient identified, Emergency Drugs available, Suction available and Patient being monitored Patient Re-evaluated:Patient Re-evaluated prior to induction Oxygen Delivery Method: Circle system utilized Preoxygenation: Pre-oxygenation with 100% oxygen Induction Type: IV induction Ventilation: Mask ventilation with difficulty Laryngoscope Size: Mac and 3 Grade View: Grade I Tube type: Oral Tube size: 7.0 mm Number of attempts: 1 Airway Equipment and Method: Stylet Placement Confirmation: ETT inserted through vocal cords under direct vision,  positive ETCO2 and breath sounds checked- equal and bilateral Secured at: 23 cm Tube secured with: Tape Dental Injury: Teeth and Oropharynx as per pre-operative assessment

## 2016-12-31 NOTE — Transfer of Care (Signed)
Immediate Anesthesia Transfer of Care Note  Patient: Misty Barker  Procedure(s) Performed: Procedure(s) with comments: LUMBAR SPINAL CORD STIMULATOR REMOVAL (N/A) - 60 mins  Patient Location: PACU  Anesthesia Type:General  Level of Consciousness: awake, alert  and patient cooperative  Airway & Oxygen Therapy: Patient Spontanous Breathing  Post-op Assessment: Report given to RN, Post -op Vital signs reviewed and stable, Patient moving all extremities X 4 and Patient able to stick tongue midline  Post vital signs: Reviewed and stable  Last Vitals:  Vitals:   12/31/16 0819 12/31/16 1157  BP: (!) 147/62   Pulse: 69   Resp: 18   Temp: 36.7 C (!) 36.3 C  SpO2: 100%     Last Pain:  Vitals:   12/31/16 0837  TempSrc:   PainSc: 4       Patients Stated Pain Goal: 8 (89/37/34 2876)  Complications: No apparent anesthesia complications

## 2016-12-31 NOTE — Brief Op Note (Signed)
12/31/2016  12:00 PM  PATIENT:  Misty Barker  71 y.o. female  PRE-OPERATIVE DIAGNOSIS:  Symptomatic hardware  POST-OPERATIVE DIAGNOSIS:  Symptomatic hardware  PROCEDURE:  Procedure(s) with comments: LUMBAR SPINAL CORD STIMULATOR REMOVAL (N/A) - 60 mins  SURGEON:  Surgeon(s) and Role:    Melina Schools, MD - Primary  PHYSICIAN ASSISTANT:   ASSISTANTS: none   ANESTHESIA:   general  EBL:  Total I/O In: 500 [I.V.:500] Out: 15 [Blood:15]  BLOOD ADMINISTERED:none  DRAINS: none   LOCAL MEDICATIONS USED:  MARCAINE     SPECIMEN:  No Specimen  DISPOSITION OF SPECIMEN:  N/A  COUNTS:  YES  TOURNIQUET:  * No tourniquets in log *  DICTATION: .Dragon Dictation  PLAN OF CARE: Discharge to home after PACU  PATIENT DISPOSITION:  PACU - hemodynamically stable.

## 2016-12-31 NOTE — Anesthesia Postprocedure Evaluation (Signed)
Anesthesia Post Note  Patient: Lilyanna Lunt Vogt-Nichols  Procedure(s) Performed: Procedure(s) (LRB): LUMBAR SPINAL CORD STIMULATOR REMOVAL (N/A)     Patient location during evaluation: PACU Anesthesia Type: General Level of consciousness: awake and alert Pain management: pain level controlled Vital Signs Assessment: post-procedure vital signs reviewed and stable Respiratory status: spontaneous breathing, nonlabored ventilation and respiratory function stable Cardiovascular status: blood pressure returned to baseline and stable Postop Assessment: no apparent nausea or vomiting Anesthetic complications: no    Last Vitals:  Vitals:   12/31/16 1300 12/31/16 1304  BP: 133/79   Pulse: 81 77  Resp: 14 16  Temp:    SpO2: 100% 100%    Last Pain:  Vitals:   12/31/16 1300  TempSrc:   PainSc: 5     LLE Motor Response: Purposeful movement;Responds to commands (12/31/16 1300) LLE Sensation: Full sensation (12/31/16 1300) RLE Motor Response: Purposeful movement;Responds to commands (12/31/16 1300) RLE Sensation: Full sensation (12/31/16 1300)      Lynda Rainwater

## 2016-12-31 NOTE — Anesthesia Preprocedure Evaluation (Signed)
Anesthesia Evaluation  Patient identified by MRN, date of birth, ID band Patient awake    Reviewed: Allergy & Precautions, NPO status , Patient's Chart, lab work & pertinent test results  History of Anesthesia Complications (+) PONV and history of anesthetic complications  Airway Mallampati: II  TM Distance: >3 FB Neck ROM: Full    Dental  (+) Dental Advisory Given   Pulmonary neg pulmonary ROS,    breath sounds clear to auscultation       Cardiovascular negative cardio ROS   Rhythm:Regular Rate:Normal     Neuro/Psych  Headaches, Depression    GI/Hepatic negative GI ROS, Neg liver ROS,   Endo/Other  Hypothyroidism   Renal/GU negative Renal ROS     Musculoskeletal  (+) Arthritis , Osteoarthritis,  scoliosis   Abdominal   Peds  Hematology negative hematology ROS (+)   Anesthesia Other Findings   Reproductive/Obstetrics                             Anesthesia Physical  Anesthesia Plan  ASA: II  Anesthesia Plan: General   Post-op Pain Management:    Induction: Intravenous  PONV Risk Score and Plan: 4 or greater and Ondansetron, Dexamethasone, Midazolam and Treatment may vary due to age or medical condition  Airway Management Planned: Oral ETT  Additional Equipment:   Intra-op Plan:   Post-operative Plan: Extubation in OR  Informed Consent: I have reviewed the patients History and Physical, chart, labs and discussed the procedure including the risks, benefits and alternatives for the proposed anesthesia with the patient or authorized representative who has indicated his/her understanding and acceptance.   Dental advisory given  Plan Discussed with: CRNA and Surgeon  Anesthesia Plan Comments: (Plan routine monitors)        Anesthesia Quick Evaluation

## 2017-01-01 ENCOUNTER — Encounter (HOSPITAL_COMMUNITY): Payer: Self-pay | Admitting: Orthopedic Surgery

## 2017-01-18 DIAGNOSIS — T85848D Pain due to other internal prosthetic devices, implants and grafts, subsequent encounter: Secondary | ICD-10-CM | POA: Diagnosis not present

## 2017-01-18 DIAGNOSIS — Z4789 Encounter for other orthopedic aftercare: Secondary | ICD-10-CM | POA: Diagnosis not present

## 2017-01-20 DIAGNOSIS — G894 Chronic pain syndrome: Secondary | ICD-10-CM | POA: Diagnosis not present

## 2017-01-20 DIAGNOSIS — M545 Low back pain: Secondary | ICD-10-CM | POA: Diagnosis not present

## 2017-01-20 DIAGNOSIS — G8929 Other chronic pain: Secondary | ICD-10-CM | POA: Diagnosis not present

## 2017-01-28 ENCOUNTER — Other Ambulatory Visit: Payer: Self-pay | Admitting: Family Medicine

## 2017-01-29 ENCOUNTER — Other Ambulatory Visit: Payer: Self-pay | Admitting: Family Medicine

## 2017-02-04 ENCOUNTER — Ambulatory Visit (INDEPENDENT_AMBULATORY_CARE_PROVIDER_SITE_OTHER): Payer: Medicare Other | Admitting: Family Medicine

## 2017-02-04 ENCOUNTER — Encounter: Payer: Self-pay | Admitting: Family Medicine

## 2017-02-04 DIAGNOSIS — E876 Hypokalemia: Secondary | ICD-10-CM | POA: Diagnosis not present

## 2017-02-04 DIAGNOSIS — M48061 Spinal stenosis, lumbar region without neurogenic claudication: Secondary | ICD-10-CM | POA: Diagnosis not present

## 2017-02-04 DIAGNOSIS — R109 Unspecified abdominal pain: Secondary | ICD-10-CM

## 2017-02-04 DIAGNOSIS — E079 Disorder of thyroid, unspecified: Secondary | ICD-10-CM

## 2017-02-04 DIAGNOSIS — Z0000 Encounter for general adult medical examination without abnormal findings: Secondary | ICD-10-CM | POA: Diagnosis not present

## 2017-02-04 HISTORY — DX: Hypokalemia: E87.6

## 2017-02-04 HISTORY — DX: Encounter for general adult medical examination without abnormal findings: Z00.00

## 2017-02-04 LAB — CBC
HCT: 41.4 % (ref 36.0–46.0)
Hemoglobin: 13.4 g/dL (ref 12.0–15.0)
MCHC: 32.3 g/dL (ref 30.0–36.0)
MCV: 83.9 fl (ref 78.0–100.0)
PLATELETS: 251 10*3/uL (ref 150.0–400.0)
RBC: 4.93 Mil/uL (ref 3.87–5.11)
RDW: 14.6 % (ref 11.5–15.5)
WBC: 6.2 10*3/uL (ref 4.0–10.5)

## 2017-02-04 LAB — COMPREHENSIVE METABOLIC PANEL
ALBUMIN: 4.3 g/dL (ref 3.5–5.2)
ALK PHOS: 61 U/L (ref 39–117)
ALT: 19 U/L (ref 0–35)
AST: 26 U/L (ref 0–37)
BILIRUBIN TOTAL: 0.6 mg/dL (ref 0.2–1.2)
BUN: 11 mg/dL (ref 6–23)
CALCIUM: 10 mg/dL (ref 8.4–10.5)
CHLORIDE: 100 meq/L (ref 96–112)
CO2: 34 mEq/L — ABNORMAL HIGH (ref 19–32)
CREATININE: 0.76 mg/dL (ref 0.40–1.20)
GFR: 79.34 mL/min (ref >60.00)
Glucose, Bld: 91 mg/dL (ref 70–99)
Potassium: 4 mEq/L (ref 3.5–5.1)
Sodium: 140 mEq/L (ref 135–145)
TOTAL PROTEIN: 6.8 g/dL (ref 6.0–8.3)

## 2017-02-04 LAB — T4, FREE: Free T4: 0.76 ng/dL (ref 0.60–1.60)

## 2017-02-04 LAB — TSH

## 2017-02-04 NOTE — Assessment & Plan Note (Signed)
Check TSH and free T4 today 

## 2017-02-04 NOTE — Assessment & Plan Note (Signed)
Had a spinal cord stimulator placed and removed due to it actually worsening her pain. Since removal she continues to have some low back pain but also some right thoracic pain which is new. She is now taking hydrocodone twice daily and this manages to get her through most days. No radicular symptoms or incontinence and she is trying to stay as active as possible although excessive activity does flare her pain she may have an extra Tylenol tablet twice a day to her Norco not to exceed 3000 mg of Tylenol in 24 hours. Consider topical treatments as well.

## 2017-02-04 NOTE — Assessment & Plan Note (Signed)
Check cmp today 

## 2017-02-04 NOTE — Assessment & Plan Note (Signed)
Improved bowels moving well. Check labs today

## 2017-02-04 NOTE — Patient Instructions (Signed)
Tylenol/Acetamniophen/APAP max of 3000 mg in 24 hours, add an extra Tylenol 500 to each hydrocodone Preventive Care 65 Years and Older, Female Preventive care refers to lifestyle choices and visits with your health care provider that can promote health and wellness. What does preventive care include?  A yearly physical exam. This is also called an annual well check.  Dental exams once or twice a year.  Routine eye exams. Ask your health care provider how often you should have your eyes checked.  Personal lifestyle choices, including: ? Daily care of your teeth and gums. ? Regular physical activity. ? Eating a healthy diet. ? Avoiding tobacco and drug use. ? Limiting alcohol use. ? Practicing safe sex. ? Taking low-dose aspirin every day. ? Taking vitamin and mineral supplements as recommended by your health care provider. What happens during an annual well check? The services and screenings done by your health care provider during your annual well check will depend on your age, overall health, lifestyle risk factors, and family history of disease. Counseling Your health care provider may ask you questions about your:  Alcohol use.  Tobacco use.  Drug use.  Emotional well-being.  Home and relationship well-being.  Sexual activity.  Eating habits.  History of falls.  Memory and ability to understand (cognition).  Work and work Statistician.  Reproductive health.  Screening You may have the following tests or measurements:  Height, weight, and BMI.  Blood pressure.  Lipid and cholesterol levels. These may be checked every 5 years, or more frequently if you are over 39 years old.  Skin check.  Lung cancer screening. You may have this screening every year starting at age 72 if you have a 30-pack-year history of smoking and currently smoke or have quit within the past 15 years.  Fecal occult blood test (FOBT) of the stool. You may have this test every year  starting at age 72.  Flexible sigmoidoscopy or colonoscopy. You may have a sigmoidoscopy every 5 years or a colonoscopy every 10 years starting at age 72.  Hepatitis C blood test.  Hepatitis B blood test.  Sexually transmitted disease (STD) testing.  Diabetes screening. This is done by checking your blood sugar (glucose) after you have not eaten for a while (fasting). You may have this done every 1-3 years.  Bone density scan. This is done to screen for osteoporosis. You may have this done starting at age 72.  Mammogram. This may be done every 1-2 years. Talk to your health care provider about how often you should have regular mammograms.  Talk with your health care provider about your test results, treatment options, and if necessary, the need for more tests. Vaccines Your health care provider may recommend certain vaccines, such as:  Influenza vaccine. This is recommended every year.  Tetanus, diphtheria, and acellular pertussis (Tdap, Td) vaccine. You may need a Td booster every 10 years.  Varicella vaccine. You may need this if you have not been vaccinated.  Zoster vaccine. You may need this after age 72.  Measles, mumps, and rubella (MMR) vaccine. You may need at least one dose of MMR if you were born in 1957 or later. You may also need a second dose.  Pneumococcal 13-valent conjugate (PCV13) vaccine. One dose is recommended after age 72.  Pneumococcal polysaccharide (PPSV23) vaccine. One dose is recommended after age 72.  Meningococcal vaccine. You may need this if you have certain conditions.  Hepatitis A vaccine. You may need this if you have certain  conditions or if you travel or work in places where you may be exposed to hepatitis A.  Hepatitis B vaccine. You may need this if you have certain conditions or if you travel or work in places where you may be exposed to hepatitis B.  Haemophilus influenzae type b (Hib) vaccine. You may need this if you have certain  conditions.  Talk to your health care provider about which screenings and vaccines you need and how often you need them. This information is not intended to replace advice given to you by your health care provider. Make sure you discuss any questions you have with your health care provider. Document Released: 04/26/2015 Document Revised: 12/18/2015 Document Reviewed: 01/29/2015 Elsevier Interactive Patient Education  2017 [employer].  Hydrocodone/APAP 7.5/325

## 2017-02-04 NOTE — Progress Notes (Signed)
Subjective:  I acted as a Education administrator for Dr. Charlett Blake. Misty Barker, [place]  [name], female    DOB: 1945-07-21, 71 y.o.   MRN: [id number]  No chief complaint on file.   HPI  Patient is in today for an annual exam And follow-up with chronic medical concerns. She has had significant back pain, weight loss, thyroid disease, depression and anxiety. She has had some mild weight gain since her last visit and does acknowledge she is eating somewhat better now. Check a spinal cord simulator placed and removed secondary to it worsening her pain she is slowly recovering but does feel weaker than she did before the surgery. No falls or injury. She is eating somewhat better. No recent febrile illness. Is managing her ADLs. Denies CP/palp/SOB/HA/congestion/fevers/GI or GU c/o. Taking meds as prescribed  Patient Care Team: Mosie Lukes, MD as PCP - General (Family Medicine) Renie Ora, MD as Consulting Physician (Pain Medicine)   Past Medical History:  Diagnosis Date  . Abdominal pain 02/01/2015  . Allergy    "seasonal allergies"  . Anemia   . Arthritis    osteoarthritis-"DDD" hips. fingers, toes.  . Blood transfusion without reported diagnosis   . Complication of anesthesia   . Depression   . Depression with anxiety   . Frequent headaches    no problems now.  Marland Kitchen Hearing loss in right ear 03/10/2015   same over last 6 months  . Hip dislocation, right ([place])   . History of chicken pox   . Hyperlipidemia    pt. state no hx  . Hypothyroidism    supplement used  . Osteoporosis   . Peripheral neuropathy 02/01/2015   right hand"carpal tunnel"  . PONV (postoperative nausea and vomiting)   . Right hip pain 05/24/2015  . Scoliosis 02/01/2015  . Spinal stenosis of lumbar region 10/23/2016  . Thyroid disease   . URI, acute 03/10/2015  . UTI (urinary tract infection) 12/14/2016  . 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 ACETABULAR REVISION Right 10/30/2015   Procedure: RIGHT HIP ACETABULAR REVISION /CONSTRAINED LINER (POSTERIOR APPROACH) ;  Surgeon: Gaynelle Arabian, MD;  Location: [facility];  Service: Orthopedics;  Laterality: Right;  . BREAST SURGERY     lump removed, and breast reduction  . EYE SURGERY     lasik   2002  . HIP CLOSED REDUCTION Right 06/30/2015   Procedure: CLOSED MANIPULATION HIP;  Surgeon: Melina Schools, MD;  Location: [facility];  Service: Orthopedics;  Laterality: Right;  . JOINT REPLACEMENT     3 hips on right and 1 on left  . METACARPOPHALANGEAL JOINT CAPSULECTOMY / CAPSULOTOMY  1995  . SPINAL CORD STIMULATOR INSERTION N/A 12/02/2016   Procedure: LUMBAR SPINAL CORD STIMULATOR INSERTION;  Surgeon: Melina Schools, MD;  Location: [facility];  Service: Orthopedics;  Laterality: N/A;  2.5 hrs  . SPINAL CORD STIMULATOR REMOVAL N/A 12/31/2016   Procedure: LUMBAR SPINAL CORD STIMULATOR REMOVAL;  Surgeon: Melina Schools, MD;  Location: [facility];  Service: Orthopedics;  Laterality: N/A;  60 mins  . TONSILLECTOMY    . TOTAL HIP ARTHROPLASTY  2011   left hip  . TOTAL HIP ARTHROPLASTY Right 06/12/2015   Procedure: RIGHT TOTAL HIP ARTHROPLASTY ANTERIOR APPROACH;  Surgeon: Gaynelle Arabian, MD;  Location: [facility];  Service: Orthopedics;  Laterality: Right;  . TOTAL HIP REVISION Right 07/19/2015   Procedure: RIGHT HIP BEARING  SURFACE REVISION;  Surgeon: Gaynelle Arabian, MD;  Location: [facility];  Service: Orthopedics;  Laterality: Right;  . TUBAL LIGATION  1980   left fallopian tube removal    Family History  Problem Relation Age of Onset  . Arthritis Mother   . Dementia Mother   . Mental illness Father        suicide  . Arthritis Maternal Grandmother   . Kidney disease Son        b/l multicystic kidney disease s/p 4 transplants  . Colon cancer Neg Hx     Social History   Social History  . Marital status: Married    Spouse name: N/A  . Number of children: N/A  . Years of education: 54    Occupational History  . retired    Social History Main Topics  . Smoking status: Never Smoker  . Smokeless tobacco: Never Used  . Alcohol use 0.6 oz/week    1 Glasses of wine per week  . Drug use: No  . 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 Not on file   Social History Narrative  . No narrative on file    Outpatient Medications Prior to Visit  Medication Sig Dispense Refill  . aspirin EC 81 MG tablet Take 81 mg by mouth daily.    Marland Kitchen b complex vitamins tablet Take 1 tablet by mouth daily.    . cetirizine (ZYRTEC) 10 MG tablet Take 10 mg by mouth daily.     . cholecalciferol (VITAMIN D) 1000 units tablet Take 1,000 Units by mouth daily.    . DULoxetine (CYMBALTA) 60 MG capsule Take 60 mg by mouth daily.    . fluticasone (FLONASE) 50 MCG/ACT nasal spray Place 1 spray into both nostrils daily as needed for allergies or rhinitis.    . furosemide (LASIX) 20 MG tablet TAKE 1 TABLET DAILY 90 tablet 3  . gabapentin (NEURONTIN) 300 MG capsule TAKE 1 CAPSULE AT BEDTIME 90 capsule 3  . HYDROcodone-acetaminophen (NORCO) 10-325 MG tablet Take 1 tablet by mouth every 4 (four) hours as needed. 30 tablet 0  . Ketotifen Fumarate (ZADITOR OP) Place 1 drop into both eyes 2 (two) times daily as needed (itchy eyes).    Marland Kitchen levothyroxine (SYNTHROID, LEVOTHROID) 50 MCG tablet Take 1 tablet (50 mcg total) by mouth daily. 90 tablet 3  . liothyronine (CYTOMEL) 25 MCG tablet TAKE 1 TABLET DAILY 90 tablet 3   No facility-administered medications prior to visit.     Allergies  Allergen Reactions  . Penicillins Hives    Childhood allergy  Has patient had a PCN reaction causing immediate rash, facial/tongue/throat swelling, SOB or lightheadedness with hypotension: No Has patient had a PCN reaction causing severe rash involving mucus membranes or skin necrosis: No Has patient had a PCN reaction that  required hospitalization No Has patient had a PCN reaction occurring within the last 10 years: No If all of the above answers are "NO", then may proceed with Cephalosporin use.  . Sulfa Antibiotics Hives    Review of Systems  Constitutional: Negative for fever and malaise/fatigue.  HENT: Negative for congestion.   Eyes: Negative for blurred vision.  Respiratory: Negative for cough and shortness of breath.   Cardiovascular: Negative for chest pain, palpitations and leg swelling.  Gastrointestinal: Negative for vomiting.  Musculoskeletal: Negative for back pain.  Skin: Negative for rash.  Neurological: Negative  for loss of consciousness and headaches.       Objective:    Physical Exam  Constitutional: She is oriented to person, place, and time. She appears well-developed and well-nourished. No distress.  HENT:  Head: Normocephalic and atraumatic.  Eyes: Conjunctivae are normal.  Neck: Normal range of motion. No thyromegaly present.  Cardiovascular: Normal rate and regular rhythm.   Pulmonary/Chest: Effort normal and breath sounds normal. She has no wheezes.  Abdominal: Soft. Bowel sounds are normal. There is no tenderness.  Musculoskeletal: Normal range of motion. She exhibits no edema or deformity.  Neurological: She is alert and oriented to person, place, and time.  Skin: Skin is warm and dry. She is not diaphoretic.  Psychiatric: She has a normal mood and affect.    There were no vitals taken for this visit. Wt Readings from Last 3 Encounters:  12/31/16 96 lb (43.5 kg)  12/08/16 103 lb 9.6 oz (47 kg)  12/02/16 100 lb (45.4 kg)   BP Readings from Last 3 Encounters:  12/31/16 133/68  12/08/16 134/84  12/03/16 (!) 114/58      There is no immunization history on file for this patient.  Health Maintenance  Topic Date Due  . Hepatitis C Screening  1945-05-20  . TETANUS/TDAP  04/29/1963  . DEXA SCAN  04/28/2009  . PNA vac Low Risk Adult (1 of 2 - PCV13) 04/28/2009   . INFLUENZA VACCINE  03/01/2017 (Originally 11/11/2016)  . MAMMOGRAM  02/12/2018  . COLONOSCOPY  07/22/2026    Lab Results  Component Value Date   WBC 6.4 12/31/2016   HGB 12.9 12/31/2016   HCT 39.9 12/31/2016   PLT 227 12/31/2016   GLUCOSE 70 12/08/2016   CHOL 207 (H) 10/22/2016   TRIG 70.0 10/22/2016   HDL 70.20 10/22/2016   LDLCALC 122 (H) 10/22/2016   ALT 12 12/08/2016   AST 20 12/08/2016   NA 136 12/08/2016   K 3.4 (L) 12/08/2016   CL 94 (L) 12/08/2016   CREATININE 0.68 12/08/2016   BUN 10 12/08/2016   CO2 34 (H) 12/08/2016   TSH <0.01 (L) 10/22/2016   INR 0.95 10/29/2015    Lab Results  Component Value Date   TSH <0.01 (L) 10/22/2016   Lab Results  Component Value Date   WBC 6.4 12/31/2016   HGB 12.9 12/31/2016   HCT 39.9 12/31/2016   MCV 83.6 12/31/2016   PLT 227 12/31/2016   Lab Results  Component Value Date   NA 136 12/08/2016   K 3.4 (L) 12/08/2016   CO2 34 (H) 12/08/2016   GLUCOSE 70 12/08/2016   BUN 10 12/08/2016   CREATININE 0.68 12/08/2016   BILITOT 0.4 12/08/2016   ALKPHOS 47 12/08/2016   AST 20 12/08/2016   ALT 12 12/08/2016   PROT 6.7 12/08/2016   ALBUMIN 4.0 12/08/2016   CALCIUM 10.2 12/08/2016   ANIONGAP 9 11/30/2016   GFR 90.24 12/08/2016   Lab Results  Component Value Date   CHOL 207 (H) 10/22/2016   Lab Results  Component Value Date   HDL 70.20 10/22/2016   Lab Results  Component Value Date   LDLCALC 122 (H) 10/22/2016   Lab Results  Component Value Date   TRIG 70.0 10/22/2016   Lab Results  Component Value Date   CHOLHDL 3 10/22/2016   No results found for: HGBA1C       Assessment & Plan:   Problem List Items Addressed This Visit    None  I am having Ms. Vogt-Nichols maintain her cetirizine, levothyroxine, aspirin EC, b complex vitamins, cholecalciferol, DULoxetine, fluticasone, Ketotifen Fumarate (ZADITOR OP), HYDROcodone-acetaminophen, furosemide, liothyronine, and gabapentin.  No orders of the  defined types were placed in this encounter.   CMA served as Education administrator during this visit. History, Physical and Plan performed by medical provider. Documentation and orders reviewed and attested to.  Magdalene Molly, [place]

## 2017-02-04 NOTE — Assessment & Plan Note (Signed)
Patient encouraged to maintain heart healthy diet, regular exercise, adequate sleep. Consider daily probiotics. Take medications as prescribed 

## 2017-02-15 DIAGNOSIS — Z4789 Encounter for other orthopedic aftercare: Secondary | ICD-10-CM | POA: Diagnosis not present

## 2017-03-09 ENCOUNTER — Other Ambulatory Visit: Payer: Self-pay | Admitting: Family Medicine

## 2017-03-09 DIAGNOSIS — Z1231 Encounter for screening mammogram for malignant neoplasm of breast: Secondary | ICD-10-CM

## 2017-03-11 ENCOUNTER — Ambulatory Visit (HOSPITAL_BASED_OUTPATIENT_CLINIC_OR_DEPARTMENT_OTHER)
Admission: RE | Admit: 2017-03-11 | Discharge: 2017-03-11 | Disposition: A | Discharge status: Home / Self Care | Payer: Medicare Other | Source: Ambulatory Visit | Attending: Family Medicine | Admitting: Family Medicine

## 2017-03-11 ENCOUNTER — Encounter (HOSPITAL_BASED_OUTPATIENT_CLINIC_OR_DEPARTMENT_OTHER): Payer: Self-pay

## 2017-03-11 DIAGNOSIS — Z1231 Encounter for screening mammogram for malignant neoplasm of breast: Secondary | ICD-10-CM | POA: Insufficient documentation

## 2017-03-20 ENCOUNTER — Other Ambulatory Visit: Payer: Self-pay | Admitting: Family Medicine

## 2017-03-24 DIAGNOSIS — M48062 Spinal stenosis, lumbar region with neurogenic claudication: Secondary | ICD-10-CM | POA: Diagnosis not present

## 2017-03-29 DIAGNOSIS — H10413 Chronic giant papillary conjunctivitis, bilateral: Secondary | ICD-10-CM | POA: Diagnosis not present

## 2017-03-29 DIAGNOSIS — H2513 Age-related nuclear cataract, bilateral: Secondary | ICD-10-CM | POA: Diagnosis not present

## 2017-03-29 DIAGNOSIS — H40013 Open angle with borderline findings, low risk, bilateral: Secondary | ICD-10-CM | POA: Diagnosis not present

## 2017-03-29 DIAGNOSIS — D3131 Benign neoplasm of right choroid: Secondary | ICD-10-CM | POA: Diagnosis not present

## 2017-05-26 DIAGNOSIS — M546 Pain in thoracic spine: Secondary | ICD-10-CM | POA: Diagnosis not present

## 2017-05-26 DIAGNOSIS — M48061 Spinal stenosis, lumbar region without neurogenic claudication: Secondary | ICD-10-CM | POA: Diagnosis not present

## 2017-07-26 ENCOUNTER — Ambulatory Visit: Payer: Self-pay

## 2017-07-26 NOTE — Telephone Encounter (Signed)
Please advise 

## 2017-07-26 NOTE — Telephone Encounter (Signed)
Pt calling with c/o of lightheadedness and night sweats for the past month. She stated that she feels like her heart rate is fluttering. HR sitting is 84 bpm; standing HR is 80 bpm. Pt states that she fells the lightheadedness when she stands and has them when at activity. She states the lightheadedness last 5-10 seconds then goes away. Pt having night sweats that she describes as waking up at 3 or 4 o'clock in the am and will be drench with sweat.  No available slots open for appointment. Pt only wants to see Dr. Charlett Blake. Offered another provider and pt refused. Called PCP office and spoke to [facility]. Shequita stated to send triage note to PCP high priority to assist getting an appointment.  Reason for Disposition . [1] MILD dizziness (e.g., walking normally) AND [2] has NOT been evaluated by physician for this  (Exception: dizziness caused by heat exposure, sudden standing, or poor fluid intake)  Answer Assessment - Initial Assessment Questions 1. DESCRIPTION: "Describe your dizziness."     Gets lightheaed and feels like her hearts beating fast- has to hold on something- doesn't last long (5-10 seconds) 2. LIGHTHEADED: "Do you feel lightheaded?" (e.g., somewhat faint, woozy, weak upon standing)     yes 3. VERTIGO: "Do you feel like either you or the room is spinning or tilting?" (i.e. vertigo)     no 4. SEVERITY: "How bad is it?"  "Do you feel like you are going to faint?" "Can you stand and walk?"   - MILD - walking normally   - MODERATE - interferes with normal activities (e.g., work, school)    - SEVERE - unable to stand, requires support to walk, feels like passing out now.      mild 5. ONSET:  "When did the dizziness begin?"     1 month 6. AGGRAVATING FACTORS: "Does anything make it worse?" (e.g., standing, change in head position)     Standing or with activity where she is standing it will happen- some days she doesn't have at all but may happen twice an day 7. HEART RATE: "Can you tell  me your heart rate?" "How many beats in 15 seconds?"  (Note: not all patients can do this)       84 per minute sitting 80 bpm with standing 8. CAUSE: "What do you think is causing the dizziness?"     Pt doesn't know 9. RECURRENT SYMPTOM: "Have you had dizziness before?" If so, ask: "When was the last time?" "What happened that time?"     Yes occasionally but not regular enough that it concerned pt- pt doesn't happen when the last time was- 10. OTHER SYMPTOMS: "Do you have any other symptoms?" (e.g., fever, chest pain, vomiting, diarrhea, bleeding)       Night sweats usually about 3 or 4 o clock in the am that she wakes up soaking wet 11. PREGNANCY: "Is there any chance you are pregnant?" "When was your last menstrual period?"       n/a  Protocols used: DIZZINESS [facility]

## 2017-07-26 NOTE — Telephone Encounter (Signed)
Spoke with patient she stated she does not want an appointment. She made appointment with PCP 08/17/17 @ 9:30am.

## 2017-07-26 NOTE — Telephone Encounter (Signed)
Try to squeeze her in if she can get here safely or have her go to ED if she is worsening and cannot get a ride

## 2017-08-17 ENCOUNTER — Encounter: Payer: Self-pay | Admitting: Family Medicine

## 2017-08-17 ENCOUNTER — Ambulatory Visit (INDEPENDENT_AMBULATORY_CARE_PROVIDER_SITE_OTHER): Payer: Medicare Other | Admitting: Family Medicine

## 2017-08-17 ENCOUNTER — Ambulatory Visit (HOSPITAL_BASED_OUTPATIENT_CLINIC_OR_DEPARTMENT_OTHER)
Admission: RE | Admit: 2017-08-17 | Discharge: 2017-08-17 | Disposition: A | Discharge status: Home / Self Care | Payer: Medicare Other | Source: Ambulatory Visit | Attending: Family Medicine | Admitting: Family Medicine

## 2017-08-17 VITALS — BP 140/78 | HR 81 | Temp 97.7°F | Resp 18 | Wt 96.2 lb

## 2017-08-17 DIAGNOSIS — J449 Chronic obstructive pulmonary disease, unspecified: Secondary | ICD-10-CM | POA: Diagnosis not present

## 2017-08-17 DIAGNOSIS — R61 Generalized hyperhidrosis: Secondary | ICD-10-CM

## 2017-08-17 DIAGNOSIS — N390 Urinary tract infection, site not specified: Secondary | ICD-10-CM | POA: Diagnosis not present

## 2017-08-17 DIAGNOSIS — R002 Palpitations: Secondary | ICD-10-CM | POA: Diagnosis not present

## 2017-08-17 DIAGNOSIS — Z9689 Presence of other specified functional implants: Secondary | ICD-10-CM

## 2017-08-17 DIAGNOSIS — E079 Disorder of thyroid, unspecified: Secondary | ICD-10-CM

## 2017-08-17 DIAGNOSIS — E785 Hyperlipidemia, unspecified: Secondary | ICD-10-CM

## 2017-08-17 DIAGNOSIS — M549 Dorsalgia, unspecified: Secondary | ICD-10-CM | POA: Diagnosis not present

## 2017-08-17 DIAGNOSIS — Z9889 Other specified postprocedural states: Secondary | ICD-10-CM | POA: Diagnosis not present

## 2017-08-17 LAB — COMPREHENSIVE METABOLIC PANEL
ALT: 19 U/L (ref 0–35)
AST: 25 U/L (ref 0–37)
Albumin: 4.3 g/dL (ref 3.5–5.2)
Alkaline Phosphatase: 57 U/L (ref 39–117)
BUN: 10 mg/dL (ref 6–23)
CHLORIDE: 100 meq/L (ref 96–112)
CO2: 34 meq/L — AB (ref 19–32)
CREATININE: 0.73 mg/dL (ref 0.40–1.20)
Calcium: 9.8 mg/dL (ref 8.4–10.5)
GFR: 82.99 mL/min (ref >60.00)
Glucose, Bld: 87 mg/dL (ref 70–99)
POTASSIUM: 4 meq/L (ref 3.5–5.1)
SODIUM: 138 meq/L (ref 135–145)
Total Bilirubin: 0.6 mg/dL (ref 0.2–1.2)
Total Protein: 6.8 g/dL (ref 6.0–8.3)

## 2017-08-17 LAB — CBC WITH DIFFERENTIAL/PLATELET
BASOS PCT: 0.4 % (ref 0.0–3.0)
Basophils Absolute: 0 10*3/uL (ref 0.0–0.1)
EOS ABS: 0.1 10*3/uL (ref 0.0–0.7)
Eosinophils Relative: 1.9 % (ref 0.0–5.0)
HCT: 40.9 % (ref 36.0–46.0)
Hemoglobin: 13.5 g/dL (ref 12.0–15.0)
LYMPHS ABS: 1.5 10*3/uL (ref 0.7–4.0)
Lymphocytes Relative: 25.4 % (ref 12.0–46.0)
MCHC: 33 g/dL (ref 30.0–36.0)
MCV: 85 fl (ref 78.0–100.0)
MONO ABS: 0.4 10*3/uL (ref 0.1–1.0)
Monocytes Relative: 6.9 % (ref 3.0–12.0)
NEUTROS ABS: 3.8 10*3/uL (ref 1.4–7.7)
Neutrophils Relative %: 65.4 % (ref 43.0–77.0)
PLATELETS: 236 10*3/uL (ref 150.0–400.0)
RBC: 4.82 Mil/uL (ref 3.87–5.11)
RDW: 14.1 % (ref 11.5–15.5)
WBC: 5.9 10*3/uL (ref 4.0–10.5)

## 2017-08-17 LAB — TSH: TSH: <0.01 u[IU]/mL — ABNORMAL LOW (ref 0.35–4.50)

## 2017-08-17 LAB — URINALYSIS
BILIRUBIN URINE: NEGATIVE
HGB URINE DIPSTICK: NEGATIVE
KETONES UR: NEGATIVE
LEUKOCYTES UA: NEGATIVE
Nitrite: NEGATIVE
Specific Gravity, Urine: 1.015 (ref 1.000–1.030)
Total Protein, Urine: NEGATIVE
UROBILINOGEN UA: 0.2 (ref 0.0–1.0)
Urine Glucose: NEGATIVE
pH: 5.5 (ref 5.0–8.0)

## 2017-08-17 LAB — SEDIMENTATION RATE: SED RATE: 5 mm/h (ref 0–30)

## 2017-08-17 LAB — T4, FREE: Free T4: 0.55 ng/dL — ABNORMAL LOW (ref 0.60–1.60)

## 2017-08-17 LAB — C-REACTIVE PROTEIN: CRP: 0.1 mg/dL — AB (ref 0.5–20.0)

## 2017-08-17 NOTE — Patient Instructions (Signed)

## 2017-08-17 NOTE — Progress Notes (Signed)
Subjective:  I acted as a Education administrator for Dr. Charlett Blake. [place], [place]  [name], female    DOB: Dec 18, 1945, 72 y.o.   MRN: [id number]  No chief complaint on file.   HPI  Patient is in today for light headiness and night sweats. She notes this has been going on about 2 months. She has a nightly hot flash. She admits to eating a nightly snack of carbohydrates. She is also noting feeling weak and off balance at some point several days a week. No pattern of occurrence such as position changes although she does note it happens most when standing and walking around. No head injury or falls. No fevers or chills. She has a long stading history of headaches and sinus congestion but this is unchanged. She also has a long history of back pain and this is stable. Denies CP/SOB/fevers/GI or GU c/o. Taking meds as prescribed  Patient Care Team: Mosie Lukes, MD as PCP - General (Family Medicine) Renie Ora, MD as Consulting Physician (Pain Medicine)   Past Medical History:  Diagnosis Date  . Abdominal pain 02/01/2015  . Allergy    "seasonal allergies"  . Anemia   . Arthritis    osteoarthritis-"DDD" hips. fingers, toes.  . Blood transfusion without reported diagnosis   . Complication of anesthesia   . Depression   . Depression with anxiety   . Frequent headaches    no problems now.  Marland Kitchen Hearing loss in right ear 03/10/2015   same over last 6 months  . Hip dislocation, right ([place])   . History of chicken pox   . Hyperlipidemia    pt. state no hx  . Hypokalemia 02/04/2017  . Hypothyroidism    supplement used  . Osteoporosis   . Peripheral neuropathy 02/01/2015   right hand"carpal tunnel"  . PONV (postoperative nausea and vomiting)   . Preventative health care 02/04/2017  . Right hip pain 05/24/2015  . Scoliosis 02/01/2015  . Spinal stenosis of lumbar region 10/23/2016  . Thyroid disease   . URI, acute 03/10/2015  . UTI (urinary tract infection) 12/14/2016  .  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 ACETABULAR REVISION Right 10/30/2015   Procedure: RIGHT HIP ACETABULAR REVISION /CONSTRAINED LINER (POSTERIOR APPROACH) ;  Surgeon: Gaynelle Arabian, MD;  Location: [facility];  Service: Orthopedics;  Laterality: Right;  . BREAST EXCISIONAL BIOPSY Right   . BREAST SURGERY     lump removed, and breast reduction  . EYE SURGERY     lasik   2002  . HIP CLOSED REDUCTION Right 06/30/2015   Procedure: CLOSED MANIPULATION HIP;  Surgeon: Melina Schools, MD;  Location: [facility];  Service: Orthopedics;  Laterality: Right;  . JOINT REPLACEMENT     3 hips on right and 1 on left  . METACARPOPHALANGEAL JOINT CAPSULECTOMY / CAPSULOTOMY  1995  . REDUCTION MAMMAPLASTY    . SPINAL CORD STIMULATOR INSERTION N/A 12/02/2016   Procedure: LUMBAR SPINAL CORD STIMULATOR INSERTION;  Surgeon: Melina Schools, MD;  Location: [facility];  Service: Orthopedics;  Laterality: N/A;  2.5 hrs  . SPINAL CORD STIMULATOR REMOVAL N/A 12/31/2016   Procedure: LUMBAR SPINAL CORD STIMULATOR REMOVAL;  Surgeon: Melina Schools, MD;  Location: [facility];  Service: Orthopedics;  Laterality: N/A;  60 mins  . TONSILLECTOMY    . TOTAL HIP ARTHROPLASTY  2011   left hip  . TOTAL HIP ARTHROPLASTY  Right 06/12/2015   Procedure: RIGHT TOTAL HIP ARTHROPLASTY ANTERIOR APPROACH;  Surgeon: Gaynelle Arabian, MD;  Location: [facility];  Service: Orthopedics;  Laterality: Right;  . TOTAL HIP REVISION Right 07/19/2015   Procedure: RIGHT HIP BEARING SURFACE REVISION;  Surgeon: Gaynelle Arabian, MD;  Location: [facility];  Service: Orthopedics;  Laterality: Right;  . TUBAL LIGATION  1980   left fallopian tube removal    Family History  Problem Relation Age of Onset  . Arthritis Mother   . Dementia Mother   . Mental illness Father        suicide  . Arthritis Maternal Grandmother   . Kidney disease Son        b/l multicystic kidney disease s/p 4 transplants  . Colon cancer Neg Hx       Social History   Socioeconomic History  . Marital status: Married    Spouse name: Not on file  . Number of children: Not on file  . Years of education: 85  . Highest education level: Not on file  Occupational History  . Occupation: retired  Scientific laboratory technician  . Financial resource strain: Not on file  . Food insecurity:    Worry: Not on file    Inability: Not on file  . Transportation needs:    Medical: Not on file    Non-medical: Not on file  Tobacco Use  . Smoking status: Never Smoker  . Smokeless tobacco: Never Used  Substance and Sexual Activity  . Alcohol use: Yes    Alcohol/week: 0.6 oz    Types: 1 Glasses of wine per week  . Drug use: No  . 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 Physical activity:    Days per week: Not on file    Minutes per session: Not on file  . Stress: Not on file  Relationships  . Social connections:    Talks on phone: Not on file    Gets together: Not on file    Attends religious service: Not on file    Active member of club or organization: Not on file    Attends meetings of clubs or organizations: Not on file    Relationship status: Not on file  . Intimate partner violence:    Fear of current or ex partner: Not on file    Emotionally abused: Not on file    Physically abused: Not on file    Forced sexual activity: Not on file  Other Topics Concern  . Not on file  Social History Narrative  . Not on file    Outpatient Medications Prior to Visit  Medication Sig Dispense Refill  . aspirin EC 81 MG tablet Take 81 mg by mouth daily.    Marland Kitchen b complex vitamins tablet Take 1 tablet by mouth daily.    . cetirizine (ZYRTEC) 10 MG tablet Take 10 mg by mouth daily.     . cholecalciferol (VITAMIN D) 1000 units tablet Take 1,000 Units by mouth daily.    . DULoxetine (CYMBALTA) 60 MG capsule Take 60 mg by mouth daily.    . fluticasone (FLONASE) 50 MCG/ACT  nasal spray Place 1 spray into both nostrils daily as needed for allergies or rhinitis.    . furosemide (LASIX) 20 MG tablet TAKE 1 TABLET DAILY 90 tablet 3  . gabapentin (NEURONTIN) 300 MG capsule TAKE 1 CAPSULE AT BEDTIME 90 capsule 3  . HYDROcodone-acetaminophen ([place])  7.5-325 MG tablet Take 1 tablet by mouth 2 (two) times daily as needed.  0  . Ketotifen Fumarate (ZADITOR OP) Place 1 drop into both eyes 2 (two) times daily as needed (itchy eyes).    Marland Kitchen levothyroxine (SYNTHROID, LEVOTHROID) 50 MCG tablet TAKE 1 TABLET DAILY 90 tablet 3  . liothyronine (CYTOMEL) 25 MCG tablet TAKE 1 TABLET DAILY 90 tablet 3  . HYDROcodone-acetaminophen (NORCO) 10-325 MG tablet Take 1 tablet by mouth every 4 (four) hours as needed. 30 tablet 0   No facility-administered medications prior to visit.     Allergies  Allergen Reactions  . Penicillins Hives    Childhood allergy  Has patient had a PCN reaction causing immediate rash, facial/tongue/throat swelling, SOB or lightheadedness with hypotension: No Has patient had a PCN reaction causing severe rash involving mucus membranes or skin necrosis: No Has patient had a PCN reaction that required hospitalization No Has patient had a PCN reaction occurring within the last 10 years: No If all of the above answers are "NO", then may proceed with Cephalosporin use.  . Sulfa Antibiotics Hives    Review of Systems  Constitutional: Positive for malaise/fatigue. Negative for fever.  HENT: Negative for congestion.   Eyes: Negative for blurred vision.  Respiratory: Negative for shortness of breath.   Cardiovascular: Negative for chest pain, palpitations and leg swelling.  Gastrointestinal: Negative for abdominal pain, blood in stool and nausea.  Genitourinary: Negative for dysuria and frequency.  Musculoskeletal: Negative for falls.  Skin: Negative for rash.  Neurological: Positive for dizziness. Negative for loss of consciousness and headaches.    Endo/Heme/Allergies: Negative for environmental allergies.  Psychiatric/Behavioral: Negative for depression. The patient is not nervous/anxious.        Objective:    Physical Exam  Constitutional: She is oriented to person, place, and time. She appears well-developed and well-nourished. No distress.  frail  HENT:  Head: Normocephalic and atraumatic.  Nose: Nose normal.  Eyes: Right eye exhibits no discharge. Left eye exhibits no discharge.  Neck: Normal range of motion. Neck supple.  Cardiovascular: Normal rate and regular rhythm.  No murmur heard. Pulmonary/Chest: Effort normal and breath sounds normal.  Abdominal: Soft. Bowel sounds are normal. There is no tenderness.  Musculoskeletal: She exhibits no edema.  Neurological: She is alert and oriented to person, place, and time.  Skin: Skin is warm and dry.  Psychiatric: She has a normal mood and affect.  Nursing note and vitals reviewed.   BP 140/78 (BP Location: Left Arm, Patient Position: Sitting, Cuff Size: Normal)   Pulse 81   Temp 97.7 F (36.5 C) (Oral)   Resp 18   Wt 96 lb 3.2 oz (43.6 kg)   SpO2 93%   BMI 17.60 kg/m  Wt Readings from Last 3 Encounters:  08/17/17 96 lb 3.2 oz (43.6 kg)  02/04/17 96 lb 9.6 oz (43.8 kg)  12/31/16 96 lb (43.5 kg)   BP Readings from Last 3 Encounters:  08/17/17 140/78  02/04/17 (!) 136/55  12/31/16 133/68      There is no immunization history on file for this patient.  Health Maintenance  Topic Date Due  . Hepatitis C Screening  1945/09/19  . TETANUS/TDAP  04/29/1963  . DEXA SCAN  04/28/2009  . PNA vac Low Risk Adult (1 of 2 - PCV13) 04/28/2009  . INFLUENZA VACCINE  11/11/2017  . MAMMOGRAM  03/12/2019  . COLONOSCOPY  07/22/2026    Lab Results  Component Value Date   WBC 5.9  08/17/2017   HGB 13.5 08/17/2017   HCT 40.9 08/17/2017   PLT 236.0 08/17/2017   GLUCOSE 87 08/17/2017   CHOL 207 (H) 10/22/2016   TRIG 70.0 10/22/2016   HDL 70.20 10/22/2016   LDLCALC 122  (H) 10/22/2016   ALT 19 08/17/2017   AST 25 08/17/2017   NA 138 08/17/2017   K 4.0 08/17/2017   CL 100 08/17/2017   CREATININE 0.73 08/17/2017   BUN 10 08/17/2017   CO2 34 (H) 08/17/2017   TSH <0.01 (L) 08/17/2017   INR 0.95 10/29/2015    Lab Results  Component Value Date   TSH <0.01 (L) 08/17/2017   Lab Results  Component Value Date   WBC 5.9 08/17/2017   HGB 13.5 08/17/2017   HCT 40.9 08/17/2017   MCV 85.0 08/17/2017   PLT 236.0 08/17/2017   Lab Results  Component Value Date   NA 138 08/17/2017   K 4.0 08/17/2017   CO2 34 (H) 08/17/2017   GLUCOSE 87 08/17/2017   BUN 10 08/17/2017   CREATININE 0.73 08/17/2017   BILITOT 0.6 08/17/2017   ALKPHOS 57 08/17/2017   AST 25 08/17/2017   ALT 19 08/17/2017   PROT 6.8 08/17/2017   ALBUMIN 4.3 08/17/2017   CALCIUM 9.8 08/17/2017   ANIONGAP 9 11/30/2016   GFR 82.99 08/17/2017   Lab Results  Component Value Date   CHOL 207 (H) 10/22/2016   Lab Results  Component Value Date   HDL 70.20 10/22/2016   Lab Results  Component Value Date   LDLCALC 122 (H) 10/22/2016   Lab Results  Component Value Date   TRIG 70.0 10/22/2016   Lab Results  Component Value Date   CHOLHDL 3 10/22/2016   No results found for: HGBA1C       Assessment & Plan:   Problem List Items Addressed This Visit    Hyperlipidemia   Relevant Orders   Comprehensive metabolic panel (Completed)   EKG 12-Lead (Completed)   DG Chest 2 View (Completed)   Thyroid disease    Labs abnormal. Will proceed with ultrasound if patient agrees.       Relevant Orders   Comprehensive metabolic panel (Completed)   TSH (Completed)   T4, free (Completed)   EKG 12-Lead (Completed)   DG Chest 2 View (Completed)   Status post insertion of spinal cord stimulator    And removal. Labs make it unlikely there is an infection in site but she has an appt with neurosurgery soon to discuss.       UTI (urinary tract infection)    Urine culture negative       Night sweats    Encouraged to hydrate well and add a protein snack to her bedtime regimen. Notify us if the hot flashes continue to worsen.       Relevant Orders   Comprehensive metabolic panel (Completed)   Urinalysis (Completed)   Urine Culture (Completed)   Sedimentation rate (Completed)   C-reactive protein (Completed)   EKG 12-Lead (Completed)   DG Chest 2 View (Completed)   Palpitations - Primary    EKG unremarkable and echo ordered.       Relevant Orders   Comprehensive metabolic panel (Completed)   EKG 12-Lead (Completed)   DG Chest 2 View (Completed)    Other Visit Diagnoses    Back pain, unspecified back location, unspecified back pain laterality, unspecified chronicity       Relevant Medications   HYDROcodone-acetaminophen (NORCO) 7.5-325 MG tablet  Other Relevant Orders   CBC with Differential/Platelet (Completed)   Comprehensive metabolic panel (Completed)   Urinalysis (Completed)   Urine Culture (Completed)      I am having Alka L. Vogt-Nichols maintain her cetirizine, aspirin EC, b complex vitamins, cholecalciferol, DULoxetine, fluticasone, Ketotifen Fumarate (ZADITOR OP), furosemide, liothyronine, gabapentin, levothyroxine, and HYDROcodone-acetaminophen.  No orders of the defined types were placed in this encounter.   CMA served as Education administrator during this visit. History, Physical and Plan performed by medical provider. Documentation and orders reviewed and attested to.  Penni Homans, MD

## 2017-08-18 DIAGNOSIS — R002 Palpitations: Secondary | ICD-10-CM | POA: Insufficient documentation

## 2017-08-18 DIAGNOSIS — M546 Pain in thoracic spine: Secondary | ICD-10-CM | POA: Diagnosis not present

## 2017-08-18 DIAGNOSIS — G894 Chronic pain syndrome: Secondary | ICD-10-CM | POA: Diagnosis not present

## 2017-08-18 DIAGNOSIS — R61 Generalized hyperhidrosis: Secondary | ICD-10-CM | POA: Insufficient documentation

## 2017-08-18 LAB — URINE CULTURE
MICRO NUMBER: 90555246
SPECIMEN QUALITY: ADEQUATE

## 2017-08-18 NOTE — Assessment & Plan Note (Signed)
And removal. Labs make it unlikely there is an infection in site but she has an appt with neurosurgery soon to discuss.

## 2017-08-18 NOTE — Assessment & Plan Note (Signed)
Urine culture negative.

## 2017-08-18 NOTE — Assessment & Plan Note (Signed)
Labs abnormal. Will proceed with ultrasound if patient agrees.

## 2017-08-18 NOTE — Assessment & Plan Note (Signed)
Encouraged to hydrate well and add a protein snack to her bedtime regimen. Notify us if the hot flashes continue to worsen.

## 2017-08-18 NOTE — Assessment & Plan Note (Signed)
EKG unremarkable and echo ordered.

## 2017-08-20 ENCOUNTER — Encounter: Payer: Self-pay | Admitting: Family Medicine

## 2017-08-20 DIAGNOSIS — M546 Pain in thoracic spine: Secondary | ICD-10-CM | POA: Diagnosis not present

## 2017-08-26 ENCOUNTER — Encounter: Payer: Self-pay | Admitting: Family Medicine

## 2017-08-26 DIAGNOSIS — G894 Chronic pain syndrome: Secondary | ICD-10-CM | POA: Diagnosis not present

## 2017-08-26 DIAGNOSIS — M546 Pain in thoracic spine: Secondary | ICD-10-CM | POA: Diagnosis not present

## 2017-08-30 ENCOUNTER — Encounter: Payer: Self-pay | Admitting: Family Medicine

## 2017-08-30 ENCOUNTER — Other Ambulatory Visit: Payer: Self-pay | Admitting: Family Medicine

## 2017-08-30 DIAGNOSIS — M549 Dorsalgia, unspecified: Secondary | ICD-10-CM

## 2017-08-31 DIAGNOSIS — G894 Chronic pain syndrome: Secondary | ICD-10-CM | POA: Diagnosis not present

## 2017-09-04 ENCOUNTER — Ambulatory Visit (HOSPITAL_BASED_OUTPATIENT_CLINIC_OR_DEPARTMENT_OTHER)
Admission: RE | Admit: 2017-09-04 | Discharge: 2017-09-04 | Disposition: A | Discharge status: Home / Self Care | Payer: Medicare Other | Source: Ambulatory Visit | Attending: Family Medicine | Admitting: Family Medicine

## 2017-09-04 DIAGNOSIS — M549 Dorsalgia, unspecified: Secondary | ICD-10-CM | POA: Insufficient documentation

## 2017-09-04 DIAGNOSIS — M4800 Spinal stenosis, site unspecified: Secondary | ICD-10-CM | POA: Diagnosis present

## 2017-09-04 DIAGNOSIS — M1288 Other specific arthropathies, not elsewhere classified, other specified site: Secondary | ICD-10-CM | POA: Diagnosis not present

## 2017-09-04 DIAGNOSIS — M48061 Spinal stenosis, lumbar region without neurogenic claudication: Secondary | ICD-10-CM | POA: Insufficient documentation

## 2017-09-04 DIAGNOSIS — M4807 Spinal stenosis, lumbosacral region: Secondary | ICD-10-CM | POA: Diagnosis not present

## 2017-09-06 ENCOUNTER — Encounter: Payer: Self-pay | Admitting: Family Medicine

## 2017-09-27 DIAGNOSIS — H40013 Open angle with borderline findings, low risk, bilateral: Secondary | ICD-10-CM | POA: Diagnosis not present

## 2017-09-27 DIAGNOSIS — D3131 Benign neoplasm of right choroid: Secondary | ICD-10-CM | POA: Diagnosis not present

## 2017-09-27 DIAGNOSIS — H2513 Age-related nuclear cataract, bilateral: Secondary | ICD-10-CM | POA: Diagnosis not present

## 2017-09-27 DIAGNOSIS — H10413 Chronic giant papillary conjunctivitis, bilateral: Secondary | ICD-10-CM | POA: Diagnosis not present

## 2017-10-01 ENCOUNTER — Encounter: Payer: Self-pay | Admitting: Family Medicine

## 2017-10-01 ENCOUNTER — Ambulatory Visit (INDEPENDENT_AMBULATORY_CARE_PROVIDER_SITE_OTHER): Payer: Medicare Other | Admitting: Family Medicine

## 2017-10-01 DIAGNOSIS — Z9889 Other specified postprocedural states: Secondary | ICD-10-CM

## 2017-10-01 DIAGNOSIS — F418 Other specified anxiety disorders: Secondary | ICD-10-CM | POA: Diagnosis not present

## 2017-10-01 DIAGNOSIS — E079 Disorder of thyroid, unspecified: Secondary | ICD-10-CM

## 2017-10-01 DIAGNOSIS — M48061 Spinal stenosis, lumbar region without neurogenic claudication: Secondary | ICD-10-CM

## 2017-10-01 DIAGNOSIS — Z9689 Presence of other specified functional implants: Secondary | ICD-10-CM

## 2017-10-01 MED ORDER — LEVOTHYROXINE SODIUM 75 MCG PO TABS: 75.0000 ug | ORAL_TABLET | Freq: Every day | ORAL | 1 refills | Status: DC

## 2017-10-01 MED ORDER — METHYLPHENIDATE HCL 5 MG PO TABS: 5.0000 mg | ORAL_TABLET | Freq: Every day | ORAL | 0 refills | Status: DC | PRN

## 2017-10-01 NOTE — Patient Instructions (Addendum)
Ritalin can help with energy, can take as needed  Fluoxetine is the Prozac can add just 10 mg daily for depression Amitriptyline/Elavil can help with depression, sleep and pain but can cause excessive sedation sometimes  CoverMyMeds or GoodRX websites help find the best cost. Can try pharmacy downstairs, [facility] or [facility]  Fatigue Fatigue is feeling tired all of the time, a lack of energy, or a lack of motivation. Occasional or mild fatigue is often a normal response to activity or life in general. However, long-lasting (chronic) or extreme fatigue may indicate an underlying medical condition. Follow these instructions at home: Watch your fatigue for any changes. The following actions may help to lessen any discomfort you are feeling:  Talk to your health care provider about how much sleep you need each night. Try to get the required amount every night.  Take medicines only as directed by your health care provider.  Eat a healthy and nutritious diet. Ask your health care provider if you need help changing your diet.  Drink enough fluid to keep your urine clear or pale yellow.  Practice ways of relaxing, such as yoga, meditation, massage therapy, or acupuncture.  Exercise regularly.  Change situations that cause you stress. Try to keep your work and personal routine reasonable.  Do not abuse illegal drugs.  Limit alcohol intake to no more than 1 drink per day for nonpregnant women and 2 drinks per day for men. One drink equals 12 ounces of beer, 5 ounces of wine, or 1 ounces of hard liquor.  Take a multivitamin, if directed by your health care provider.  Contact a health care provider if:  Your fatigue does not get better.  You have a fever.  You have unintentional weight loss or gain.  You have headaches.  You have difficulty: ? Falling asleep. ? Sleeping throughout the night.  You feel angry, guilty, anxious, or sad.  You are unable to have a bowel movement  (constipation).  You skin is dry.  Your legs or another part of your body is swollen. Get help right away if:  You feel confused.  Your vision is blurry.  You feel faint or pass out.  You have a severe headache.  You have severe abdominal, pelvic, or back pain.  You have chest pain, shortness of breath, or an irregular or fast heartbeat.  You are unable to urinate or you urinate less than normal.  You develop abnormal bleeding, such as bleeding from the rectum, vagina, nose, lungs, or nipples.  You vomit blood.  You have thoughts about harming yourself or committing suicide.  You are worried that you might harm someone else. This information is not intended to replace advice given to you by your health care provider. Make sure you discuss any questions you have with your health care provider. Document Released: 01/25/2007 Document Revised: 09/05/2015 Document Reviewed: 08/01/2013 Elsevier Interactive Patient Education  [employer].

## 2017-10-01 NOTE — Assessment & Plan Note (Signed)
Continues to struggle with daily pain and is continuing to worsen with episodes of external swelling at times. No topical treatments have been helpful. Sees neurosurgery

## 2017-10-01 NOTE — Progress Notes (Signed)
Subjective:  I acted as a Education administrator for Dr. Charlett Blake. [place], [place]  [name], female    DOB: 1946-02-15, 72 y.o.   MRN: [id number]  No chief complaint on file.   HPI  Patient is in today for 6 week follow up and she continues to struggle with daily and significant back pain and swelling. She has an appt with neurosurgery this week to discuss her options. She has had worsening pain and swelling since a failed insertion and removal of a spinal cor stimulator. No fevers or chills. No falls or injuries recently. She acknowledges anhedonia and lack of motivation. She is tired and does not feel like she wants to do anything. No suicidal ideation. Denies CP/palp/SOB/HA/congestion/fevers/GI or GU c/o. Taking meds as prescribed  Patient Care Team: Mosie Lukes, MD as PCP - General (Family Medicine) Renie Ora, MD as Consulting Physician (Pain Medicine)   Past Medical History:  Diagnosis Date  . Abdominal pain 02/01/2015  . Allergy    "seasonal allergies"  . Anemia   . Arthritis    osteoarthritis-"DDD" hips. fingers, toes.  . Blood transfusion without reported diagnosis   . Complication of anesthesia   . Depression   . Depression with anxiety   . Frequent headaches    no problems now.  Marland Kitchen Hearing loss in right ear 03/10/2015   same over last 6 months  . Hip dislocation, right ([place])   . History of chicken pox   . Hyperlipidemia    pt. state no hx  . Hypokalemia 02/04/2017  . Hypothyroidism    supplement used  . Osteoporosis   . Peripheral neuropathy 02/01/2015   right hand"carpal tunnel"  . PONV (postoperative nausea and vomiting)   . Preventative health care 02/04/2017  . Right hip pain 05/24/2015  . Scoliosis 02/01/2015  . Spinal stenosis of lumbar region 10/23/2016  . Thyroid disease   . URI, acute 03/10/2015  . UTI (urinary tract infection) 12/14/2016  . 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 ACETABULAR REVISION Right 10/30/2015   Procedure: RIGHT HIP ACETABULAR REVISION /CONSTRAINED LINER (POSTERIOR APPROACH) ;  Surgeon: Gaynelle Arabian, MD;  Location: [facility];  Service: Orthopedics;  Laterality: Right;  . BREAST EXCISIONAL BIOPSY Right   . BREAST SURGERY     lump removed, and breast reduction  . EYE SURGERY     lasik   2002  . HIP CLOSED REDUCTION Right 06/30/2015   Procedure: CLOSED MANIPULATION HIP;  Surgeon: Melina Schools, MD;  Location: [facility];  Service: Orthopedics;  Laterality: Right;  . JOINT REPLACEMENT     3 hips on right and 1 on left  . METACARPOPHALANGEAL JOINT CAPSULECTOMY / CAPSULOTOMY  1995  . REDUCTION MAMMAPLASTY    . SPINAL CORD STIMULATOR INSERTION N/A 12/02/2016   Procedure: LUMBAR SPINAL CORD STIMULATOR INSERTION;  Surgeon: Melina Schools, MD;  Location: [facility];  Service: Orthopedics;  Laterality: N/A;  2.5 hrs  . SPINAL CORD STIMULATOR REMOVAL N/A 12/31/2016   Procedure: LUMBAR SPINAL CORD STIMULATOR REMOVAL;  Surgeon: Melina Schools, MD;  Location: [facility];  Service: Orthopedics;  Laterality: N/A;  60 mins  . TONSILLECTOMY    . TOTAL HIP ARTHROPLASTY  2011   left hip  . TOTAL HIP ARTHROPLASTY Right 06/12/2015   Procedure: RIGHT TOTAL HIP ARTHROPLASTY ANTERIOR APPROACH;  Surgeon: Gaynelle Arabian, MD;  Location: [facility];  Service: Orthopedics;  Laterality: Right;  . TOTAL HIP REVISION Right 07/19/2015   Procedure: RIGHT HIP BEARING SURFACE REVISION;  Surgeon: Gaynelle Arabian, MD;  Location: [facility];  Service: Orthopedics;  Laterality: Right;  . TUBAL LIGATION  1980   left fallopian tube removal    Family History  Problem Relation Age of Onset  . Arthritis Mother   . Dementia Mother   . Mental illness Father        suicide  . Arthritis Maternal Grandmother   . Kidney disease Son        b/l multicystic kidney disease s/p 4 transplants  . Colon cancer Neg Hx     Social History   Socioeconomic History  . Marital status: Married    Spouse  name: Not on file  . Number of children: Not on file  . Years of education: 66  . Highest education level: Not on file  Occupational History  . Occupation: retired  Scientific laboratory technician  . Financial resource strain: Not on file  . Food insecurity:    Worry: Not on file    Inability: Not on file  . Transportation needs:    Medical: Not on file    Non-medical: Not on file  Tobacco Use  . Smoking status: Never Smoker  . Smokeless tobacco: Never Used  Substance and Sexual Activity  . Alcohol use: Yes    Alcohol/week: 0.6 oz    Types: 1 Glasses of wine per week  . Drug use: No  . 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 Physical activity:    Days per week: Not on file    Minutes per session: Not on file  . Stress: Not on file  Relationships  . Social connections:    Talks on phone: Not on file    Gets together: Not on file    Attends religious service: Not on file    Active member of club or organization: Not on file    Attends meetings of clubs or organizations: Not on file    Relationship status: Not on file  . Intimate partner violence:    Fear of current or ex partner: Not on file    Emotionally abused: Not on file    Physically abused: Not on file    Forced sexual activity: Not on file  Other Topics Concern  . Not on file  Social History Narrative  . Not on file    Outpatient Medications Prior to Visit  Medication Sig Dispense Refill  . aspirin EC 81 MG tablet Take 81 mg by mouth daily.    Marland Kitchen b complex vitamins tablet Take 1 tablet by mouth daily.    . cetirizine (ZYRTEC) 10 MG tablet Take 10 mg by mouth daily.     . cholecalciferol (VITAMIN D) 1000 units tablet Take 1,000 Units by mouth daily.    . DULoxetine (CYMBALTA) 60 MG capsule Take 60 mg by mouth daily.    . fluticasone (FLONASE) 50 MCG/ACT nasal spray Place 1 spray into both nostrils daily as needed for allergies or rhinitis.     . furosemide (LASIX) 20 MG tablet TAKE 1 TABLET DAILY 90 tablet 3  . gabapentin (NEURONTIN) 300 MG capsule TAKE 1 CAPSULE AT BEDTIME 90 capsule 3  . HYDROcodone-acetaminophen (NORCO) 7.5-325 MG tablet Take 1 tablet by mouth 2 (two) times daily as needed.  0  . Ketotifen Fumarate (ZADITOR OP) Place 1 drop  into both eyes 2 (two) times daily as needed (itchy eyes).    Marland Kitchen levothyroxine (SYNTHROID, LEVOTHROID) 50 MCG tablet TAKE 1 TABLET DAILY 90 tablet 3  . liothyronine (CYTOMEL) 25 MCG tablet TAKE 1 TABLET DAILY 90 tablet 3   No facility-administered medications prior to visit.     Allergies  Allergen Reactions  . Penicillins Hives    Childhood allergy  Has patient had a PCN reaction causing immediate rash, facial/tongue/throat swelling, SOB or lightheadedness with hypotension: No Has patient had a PCN reaction causing severe rash involving mucus membranes or skin necrosis: No Has patient had a PCN reaction that required hospitalization No Has patient had a PCN reaction occurring within the last 10 years: No If all of the above answers are "NO", then may proceed with Cephalosporin use.  . Sulfa Antibiotics Hives    Review of Systems  Constitutional: Positive for malaise/fatigue. Negative for fever.  HENT: Negative for congestion.   Eyes: Negative for blurred vision.  Respiratory: Negative for shortness of breath.   Cardiovascular: Negative for chest pain, palpitations and leg swelling.  Gastrointestinal: Negative for abdominal pain, blood in stool and nausea.  Genitourinary: Negative for dysuria and frequency.  Musculoskeletal: Positive for back pain and joint pain. Negative for falls.  Skin: Negative for rash.  Neurological: Negative for dizziness, loss of consciousness and headaches.  Endo/Heme/Allergies: Negative for environmental allergies.  Psychiatric/Behavioral: Positive for depression. The patient is nervous/anxious.        Objective:    Physical Exam  Constitutional:  She is oriented to person, place, and time. No distress.  HENT:  Head: Normocephalic and atraumatic.  Eyes: Conjunctivae are normal.  Neck: Neck supple. No thyromegaly present.  Cardiovascular: Normal rate, regular rhythm and normal heart sounds.  No murmur heard. Pulmonary/Chest: Effort normal and breath sounds normal. She has no wheezes.  Abdominal: She exhibits no distension and no mass.  Musculoskeletal: She exhibits edema, tenderness and deformity.  Swelling over thoracic spine. Is soft and nontender. No erythema or warmth.   Lymphadenopathy:    She has no cervical adenopathy.  Neurological: She is alert and oriented to person, place, and time.  Skin: Skin is warm and dry. No rash noted. She is not diaphoretic.  Psychiatric: Judgment normal.    BP 114/84 (BP Location: Left Arm, Patient Position: Sitting, Cuff Size: Normal)   Pulse 73   Temp 97.8 F (36.6 C) (Oral)   Resp 18   Wt 97 lb (44 kg)   SpO2 98%   BMI 17.74 kg/m  Wt Readings from Last 3 Encounters:  10/01/17 97 lb (44 kg)  08/17/17 96 lb 3.2 oz (43.6 kg)  02/04/17 96 lb 9.6 oz (43.8 kg)   BP Readings from Last 3 Encounters:  10/01/17 114/84  08/17/17 140/78  02/04/17 (!) 136/55      There is no immunization history on file for this patient.  Health Maintenance  Topic Date Due  . Hepatitis C Screening  [birth year]  . TETANUS/TDAP  04/29/1963  . DEXA SCAN  04/28/2009  . PNA vac Low Risk Adult (1 of 2 - PCV13) 04/28/2009  . INFLUENZA VACCINE  11/11/2017  . MAMMOGRAM  03/12/2019  . COLONOSCOPY  07/22/2026    Lab Results  Component Value Date   WBC 5.9 08/17/2017   HGB 13.5 08/17/2017   HCT 40.9 08/17/2017   PLT 236.0 08/17/2017   GLUCOSE 87 08/17/2017   CHOL 207 (H) 10/22/2016   TRIG 70.0 10/22/2016   HDL 70.20  10/22/2016   LDLCALC 122 (H) 10/22/2016   ALT 19 08/17/2017   AST 25 08/17/2017   NA 138 08/17/2017   K 4.0 08/17/2017   CL 100 08/17/2017   CREATININE 0.73 08/17/2017   BUN 10  08/17/2017   CO2 34 (H) 08/17/2017   TSH <0.01 (L) 08/17/2017   INR 0.95 10/29/2015    Lab Results  Component Value Date   TSH <0.01 (L) 08/17/2017   Lab Results  Component Value Date   WBC 5.9 08/17/2017   HGB 13.5 08/17/2017   HCT 40.9 08/17/2017   MCV 85.0 08/17/2017   PLT 236.0 08/17/2017   Lab Results  Component Value Date   NA 138 08/17/2017   K 4.0 08/17/2017   CO2 34 (H) 08/17/2017   GLUCOSE 87 08/17/2017   BUN 10 08/17/2017   CREATININE 0.73 08/17/2017   BILITOT 0.6 08/17/2017   ALKPHOS 57 08/17/2017   AST 25 08/17/2017   ALT 19 08/17/2017   PROT 6.8 08/17/2017   ALBUMIN 4.3 08/17/2017   CALCIUM 9.8 08/17/2017   ANIONGAP 9 11/30/2016   GFR 82.99 08/17/2017   Lab Results  Component Value Date   CHOL 207 (H) 10/22/2016   Lab Results  Component Value Date   HDL 70.20 10/22/2016   Lab Results  Component Value Date   LDLCALC 122 (H) 10/22/2016   Lab Results  Component Value Date   TRIG 70.0 10/22/2016   Lab Results  Component Value Date   CHOLHDL 3 10/22/2016   No results found for: HGBA1C       Assessment & Plan:   Problem List Items Addressed This Visit    Depression with anxiety    She acknowledges her chronic pain is contributing to significant fatigue and worsening depression. After an extended discussion on treatment options. It is decided she will continue Duloxetine and add Ritalin 5 mg to help energize her. If not tolerated may consider adding just 10 mg of Fluoxetine daily      Thyroid disease    Started Cytomel and Levothyroid back in 2008 ish. Now T4 is suppressed will increase the Levothyroid back up to 75 mcg daily      Relevant Medications   levothyroxine (SYNTHROID, LEVOTHROID) 75 MCG tablet   Spinal stenosis of lumbar region    Continues to struggle with daily pain and is continuing to worsen with episodes of external swelling at times. No topical treatments have been helpful. Sees neurosurgery       Status post  insertion of spinal cord stimulator    And removal. She is in significant pain daily and has had a good deal of swelling. Aspiration of swelling showed blood so they believe she is slowly bleeding. She is scheduled to see neurosurgery this week to discuss options. She tries hard to minimize medications due to side effects         I am having Misty Barker start on levothyroxine and methylphenidate. I am also having her maintain her cetirizine, aspirin EC, b complex vitamins, cholecalciferol, DULoxetine, fluticasone, Ketotifen Fumarate (ZADITOR OP), furosemide, liothyronine, gabapentin, levothyroxine, and HYDROcodone-acetaminophen.  Meds ordered this encounter  Medications  . levothyroxine (SYNTHROID, LEVOTHROID) 75 MCG tablet    Sig: Take 1 tablet (75 mcg total) by mouth daily.    Dispense:  90 tablet    Refill:  1  . methylphenidate (RITALIN) 5 MG tablet    Sig: Take 1 tablet (5 mg total) by mouth daily as needed.  Dispense:  30 tablet    Refill:  0    CMA served as scribe during this visit. History, Physical and Plan performed by medical provider. Documentation and orders reviewed and attested to.  Penni Homans, MD

## 2017-10-01 NOTE — Assessment & Plan Note (Addendum)
Started Cytomel and Levothyroid back in 2008 ish. Now T4 is suppressed will increase the Levothyroid back up to 75 mcg daily

## 2017-10-03 NOTE — Assessment & Plan Note (Signed)
And removal. She is in significant pain daily and has had a good deal of swelling. Aspiration of swelling showed blood so they believe she is slowly bleeding. She is scheduled to see neurosurgery this week to discuss options. She tries hard to minimize medications due to side effects

## 2017-10-03 NOTE — Assessment & Plan Note (Signed)
She acknowledges her chronic pain is contributing to significant fatigue and worsening depression. After an extended discussion on treatment options. It is decided she will continue Duloxetine and add Ritalin 5 mg to help energize her. If not tolerated may consider adding just 10 mg of Fluoxetine daily

## 2017-10-04 DIAGNOSIS — M48062 Spinal stenosis, lumbar region with neurogenic claudication: Secondary | ICD-10-CM | POA: Diagnosis not present

## 2017-10-04 DIAGNOSIS — M419 Scoliosis, unspecified: Secondary | ICD-10-CM | POA: Diagnosis not present

## 2017-10-07 DIAGNOSIS — Z96643 Presence of artificial hip joint, bilateral: Secondary | ICD-10-CM | POA: Diagnosis not present

## 2017-10-11 DIAGNOSIS — G894 Chronic pain syndrome: Secondary | ICD-10-CM | POA: Diagnosis not present

## 2017-10-12 ENCOUNTER — Telehealth: Payer: Self-pay | Admitting: *Deleted

## 2017-10-12 NOTE — Telephone Encounter (Signed)
Received Medical records from [facility]-Dr. Rolena Infante; forwarded to provider/SLS 07/02

## 2017-10-19 DIAGNOSIS — G894 Chronic pain syndrome: Secondary | ICD-10-CM | POA: Diagnosis not present

## 2017-10-21 ENCOUNTER — Ambulatory Visit (HOSPITAL_BASED_OUTPATIENT_CLINIC_OR_DEPARTMENT_OTHER)
Admission: RE | Admit: 2017-10-21 | Discharge: 2017-10-21 | Disposition: A | Discharge status: Home / Self Care | Payer: Medicare Other | Source: Ambulatory Visit | Attending: Neurosurgery | Admitting: Neurosurgery

## 2017-10-21 ENCOUNTER — Other Ambulatory Visit (HOSPITAL_BASED_OUTPATIENT_CLINIC_OR_DEPARTMENT_OTHER): Payer: Self-pay | Admitting: Neurosurgery

## 2017-10-21 DIAGNOSIS — M5136 Other intervertebral disc degeneration, lumbar region: Secondary | ICD-10-CM | POA: Insufficient documentation

## 2017-10-21 DIAGNOSIS — M419 Scoliosis, unspecified: Secondary | ICD-10-CM | POA: Insufficient documentation

## 2017-10-21 DIAGNOSIS — I700 Atherosclerosis of aorta: Secondary | ICD-10-CM | POA: Insufficient documentation

## 2017-10-21 DIAGNOSIS — M48062 Spinal stenosis, lumbar region with neurogenic claudication: Secondary | ICD-10-CM

## 2017-10-25 ENCOUNTER — Other Ambulatory Visit: Payer: Self-pay | Admitting: Family Medicine

## 2017-10-26 MED ORDER — METHYLPHENIDATE HCL 5 MG PO TABS: 5.0000 mg | ORAL_TABLET | Freq: Every day | ORAL | 0 refills | Status: DC | PRN

## 2017-10-26 NOTE — Telephone Encounter (Signed)
So OK to refill and I will send it but she needs a UDS and contract

## 2017-10-26 NOTE — Telephone Encounter (Signed)
Requesting: ritalin 5mg  qday prn Contract: none found UDS: none found Last OV: 10/01/17 Next Ov: 11/30/17 Last refill: 10/01/17, #30, 0RF Database: no discrepancies found  Please advise.

## 2017-10-28 ENCOUNTER — Encounter: Payer: Self-pay | Admitting: Family Medicine

## 2017-10-29 MED ORDER — METHYLPHENIDATE HCL 5 MG PO TABS: 5.0000 mg | ORAL_TABLET | Freq: Every day | ORAL | 0 refills | Status: DC | PRN

## 2017-10-29 NOTE — Telephone Encounter (Signed)
Pt. Phoned author re: not receiving her ritalin. Per pt. [facility] stated they never received an Rx. It appears the Epic system registered the ritalin rx as "print", but no paper rx found per [place], [place]. Order pended for e-prescription for Dr. Charlett Blake to re-send.

## 2017-10-29 NOTE — Addendum Note (Signed)
Addended by: Raynelle Dick R on: 10/29/2017 11:07 AM   Modules accepted: Orders

## 2017-11-15 IMAGING — CR DG PORTABLE PELVIS
1 series · 1 of 1 positions shown · non-contrast
Comparison: None.

CLINICAL DATA: Status post right-sided total hip replacement

EXAM:
DG C-ARM 1-60 MIN-NO REPORT; PORTABLE PELVIS 1-2 VIEWS
FLUOROSCOPY TIME:  0 minutes 11 second; no submitted fluoroscopic
images.

[AP]
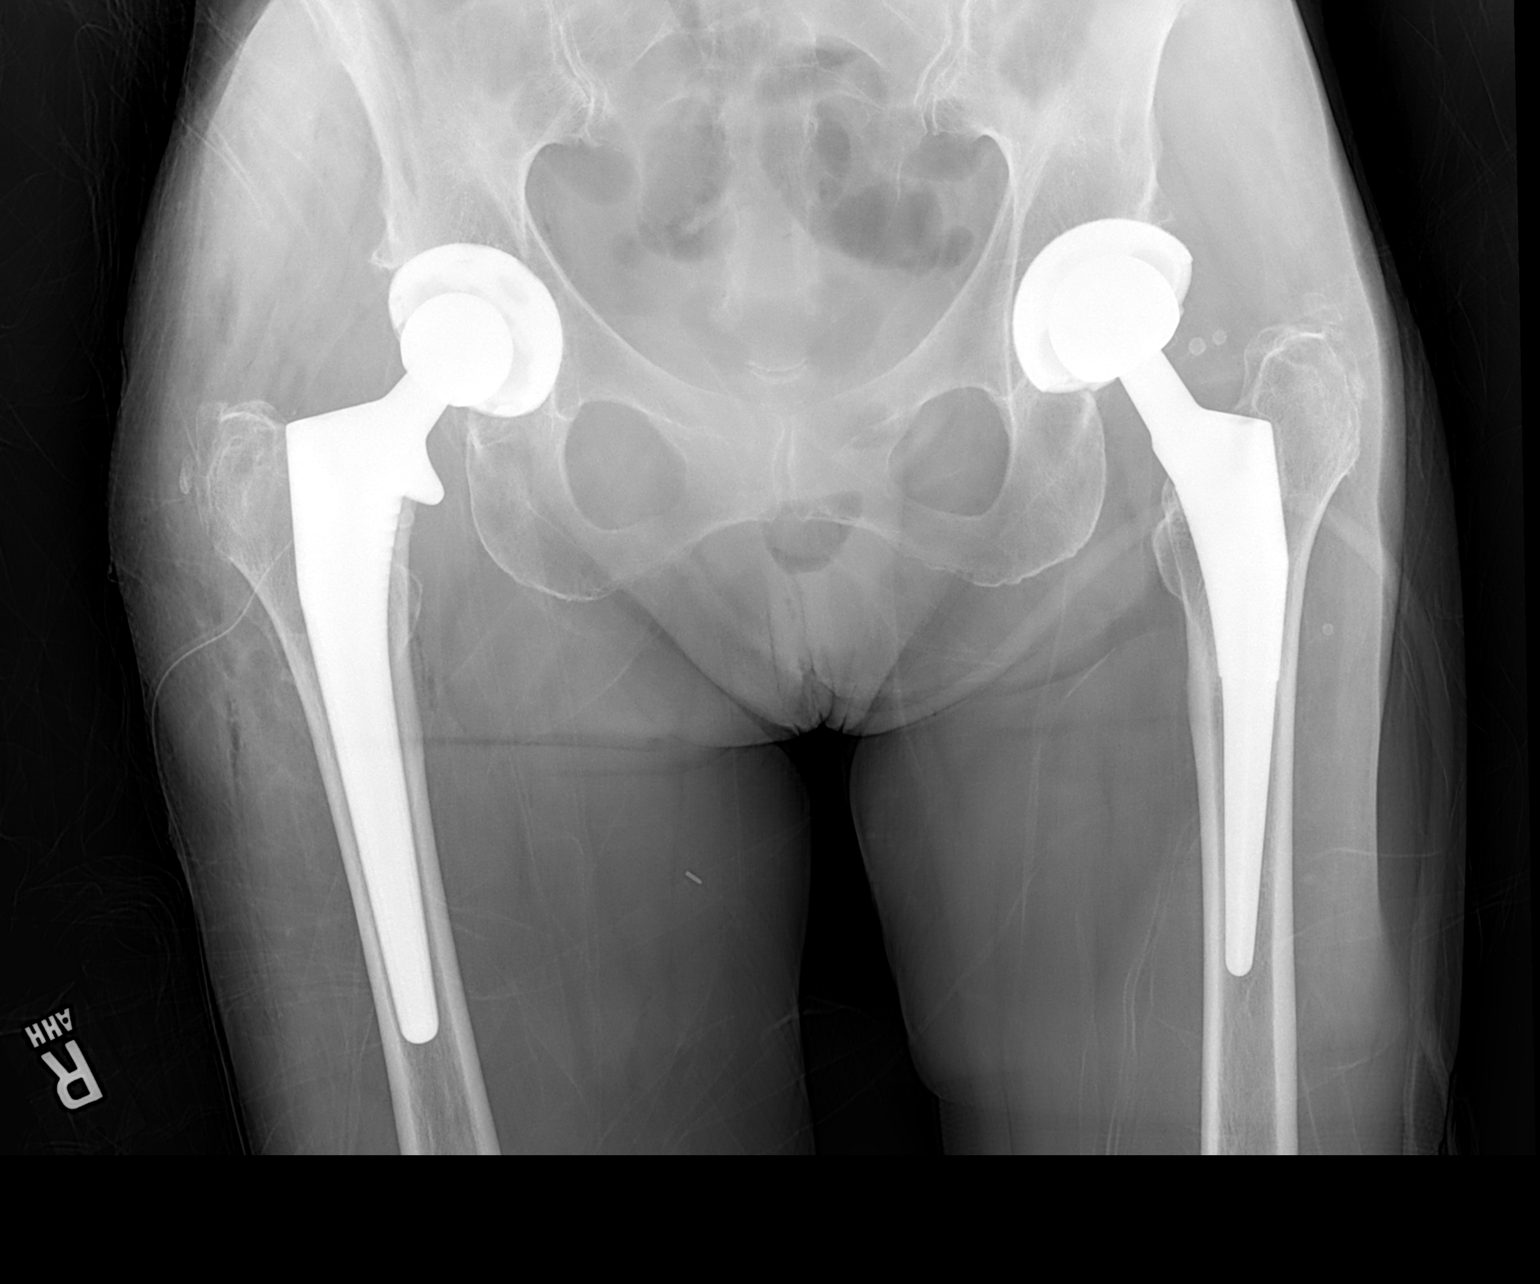

[1 of 1 positions shown; findings below may reference images not displayed]

FINDINGS: There are total hip replacements bilaterally with prosthetic
components appearing well-seated bilaterally. No acute fracture or
dislocation. There is soft tissue air on the right as well as a
surgical drain on the right.
IMPRESSION: Total hip replacement prostheses appear well seated bilaterally.
Drain on the right as well as soft tissue air, expected acute
postoperative findings. No acute fracture or dislocation.

## 2017-11-23 DIAGNOSIS — M48062 Spinal stenosis, lumbar region with neurogenic claudication: Secondary | ICD-10-CM | POA: Diagnosis not present

## 2017-11-25 ENCOUNTER — Ambulatory Visit (HOSPITAL_BASED_OUTPATIENT_CLINIC_OR_DEPARTMENT_OTHER)
Admission: RE | Admit: 2017-11-25 | Discharge: 2017-11-25 | Disposition: A | Discharge status: Home / Self Care | Payer: Medicare Other | Source: Ambulatory Visit | Attending: Family Medicine | Admitting: Family Medicine

## 2017-11-25 ENCOUNTER — Ambulatory Visit (INDEPENDENT_AMBULATORY_CARE_PROVIDER_SITE_OTHER): Payer: Medicare Other | Admitting: Family Medicine

## 2017-11-25 ENCOUNTER — Encounter: Payer: Self-pay | Admitting: Family Medicine

## 2017-11-25 VITALS — BP 108/58 | HR 84 | Temp 97.8°F | Resp 18 | Ht 64.0 in | Wt 95.2 lb

## 2017-11-25 DIAGNOSIS — I700 Atherosclerosis of aorta: Secondary | ICD-10-CM | POA: Diagnosis not present

## 2017-11-25 DIAGNOSIS — M549 Dorsalgia, unspecified: Secondary | ICD-10-CM | POA: Diagnosis not present

## 2017-11-25 DIAGNOSIS — F418 Other specified anxiety disorders: Secondary | ICD-10-CM

## 2017-11-25 DIAGNOSIS — M47814 Spondylosis without myelopathy or radiculopathy, thoracic region: Secondary | ICD-10-CM | POA: Diagnosis not present

## 2017-11-25 MED ORDER — FLUOXETINE HCL 10 MG PO TABS: 5.0000 mg | ORAL_TABLET | Freq: Every day | ORAL | 3 refills | Status: DC

## 2017-11-25 NOTE — Progress Notes (Signed)
Subjective:  I acted as a Education administrator for Dr. Charlett Blake. [place], [place]  [name], female    DOB: 1945-05-06, 73 y.o.   MRN: [id number]  No chief complaint on file.   HPI  Patient is in today for an acute visit for left shoulder pain, and rib pain. She denies any falls. She has had increased pain that has moved fom her posterior left shoulder to her ight shoulder and across the front of her chest the past few days. If she does not move it is better but with movement causes severe pain. She is having trouble sleeping and settling down because of the pain. Notes she is struggling with increased depression with her pain. Anhedonia and fatigue noted. No suicidal ideation. Denies CP/palp/SOB/HA/congestion/fevers/GI or GU c/o. Taking meds as prescribed  Patient Care Team: Mosie Lukes, MD as PCP - General (Family Medicine) Renie Ora, MD as Consulting Physician (Pain Medicine)   Past Medical History:  Diagnosis Date  . Abdominal pain 02/01/2015  . Allergy    "seasonal allergies"  . Anemia   . Arthritis    osteoarthritis-"DDD" hips. fingers, toes.  . Blood transfusion without reported diagnosis   . Complication of anesthesia   . Depression   . Depression with anxiety   . Frequent headaches    no problems now.  Marland Kitchen Hearing loss in right ear 03/10/2015   same over last 6 months  . Hip dislocation, right ([place])   . History of chicken pox   . Hyperlipidemia    pt. state no hx  . Hypokalemia 02/04/2017  . Hypothyroidism    supplement used  . Osteoporosis   . Peripheral neuropathy 02/01/2015   right hand"carpal tunnel"  . PONV (postoperative nausea and vomiting)   . Preventative health care 02/04/2017  . Right hip pain 05/24/2015  . Scoliosis 02/01/2015  . Spinal stenosis of lumbar region 10/23/2016  . Thyroid disease   . URI, acute 03/10/2015  . UTI (urinary tract infection) 12/14/2016  . 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 ACETABULAR REVISION Right 10/30/2015   Procedure: RIGHT HIP ACETABULAR REVISION /CONSTRAINED LINER (POSTERIOR APPROACH) ;  Surgeon: Gaynelle Arabian, MD;  Location: [facility];  Service: Orthopedics;  Laterality: Right;  . BREAST EXCISIONAL BIOPSY Right   . BREAST SURGERY     lump removed, and breast reduction  . EYE SURGERY     lasik   2002  . HIP CLOSED REDUCTION Right 06/30/2015   Procedure: CLOSED MANIPULATION HIP;  Surgeon: Melina Schools, MD;  Location: [facility];  Service: Orthopedics;  Laterality: Right;  . JOINT REPLACEMENT     3 hips on right and 1 on left  . METACARPOPHALANGEAL JOINT CAPSULECTOMY / CAPSULOTOMY  1995  . REDUCTION MAMMAPLASTY    . SPINAL CORD STIMULATOR INSERTION N/A 12/02/2016   Procedure: LUMBAR SPINAL CORD STIMULATOR INSERTION;  Surgeon: Melina Schools, MD;  Location: [facility];  Service: Orthopedics;  Laterality: N/A;  2.5 hrs  . SPINAL CORD STIMULATOR REMOVAL N/A 12/31/2016   Procedure: LUMBAR SPINAL CORD STIMULATOR REMOVAL;  Surgeon: Melina Schools, MD;  Location: [facility];  Service: Orthopedics;  Laterality: N/A;  60 mins  . TONSILLECTOMY    . TOTAL HIP ARTHROPLASTY  2011   left hip  . TOTAL HIP ARTHROPLASTY Right 06/12/2015   Procedure: RIGHT TOTAL HIP ARTHROPLASTY ANTERIOR APPROACH;  Surgeon: Gaynelle Arabian, MD;  Location:  [facility];  Service: Orthopedics;  Laterality: Right;  . TOTAL HIP REVISION Right 07/19/2015   Procedure: RIGHT HIP BEARING SURFACE REVISION;  Surgeon: Gaynelle Arabian, MD;  Location: [facility];  Service: Orthopedics;  Laterality: Right;  . TUBAL LIGATION  1980   left fallopian tube removal    Family History  Problem Relation Age of Onset  . Arthritis Mother   . Dementia Mother   . Mental illness Father        suicide  . Arthritis Maternal Grandmother   . Kidney disease Son        b/l multicystic kidney disease s/p 4 transplants  . Colon cancer Neg Hx     Social History   Socioeconomic History  . Marital status:  Married    Spouse name: Not on file  . Number of children: Not on file  . Years of education: 70  . Highest education level: Not on file  Occupational History  . Occupation: retired  Scientific laboratory technician  . Financial resource strain: Not on file  . Food insecurity:    Worry: Not on file    Inability: Not on file  . Transportation needs:    Medical: Not on file    Non-medical: Not on file  Tobacco Use  . Smoking status: Never Smoker  . Smokeless tobacco: Never Used  Substance and Sexual Activity  . Alcohol use: Yes    Alcohol/week: 1.0 standard drinks    Types: 1 Glasses of wine per week  . Drug use: No  . 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 Physical activity:    Days per week: Not on file    Minutes per session: Not on file  . Stress: Not on file  Relationships  . Social connections:    Talks on phone: Not on file    Gets together: Not on file    Attends religious service: Not on file    Active member of club or organization: Not on file    Attends meetings of clubs or organizations: Not on file    Relationship status: Not on file  . Intimate partner violence:    Fear of current or ex partner: Not on file    Emotionally abused: Not on file    Physically abused: Not on file    Forced sexual activity: Not on file  Other Topics Concern  . Not on file  Social History Narrative  . Not on file    Outpatient Medications Prior to Visit  Medication Sig Dispense Refill  . b complex vitamins tablet Take 1 tablet by mouth daily.    . cetirizine (ZYRTEC) 10 MG tablet Take 10 mg by mouth daily.     . cholecalciferol (VITAMIN D) 1000 units tablet Take 1,000 Units by mouth daily.    . DULoxetine (CYMBALTA) 60 MG capsule Take 60 mg by mouth daily.    . fluticasone (FLONASE) 50 MCG/ACT nasal spray Place 1 spray into both nostrils daily as needed for allergies or rhinitis.    . furosemide (LASIX)  20 MG tablet TAKE 1 TABLET DAILY 90 tablet 3  . gabapentin (NEURONTIN) 300 MG capsule TAKE 1 CAPSULE AT BEDTIME 90 capsule 3  . HYDROcodone-acetaminophen (NORCO) 7.5-325 MG tablet Take 1 tablet by mouth 2 (two) times daily as needed.  0  . Ketotifen Fumarate (ZADITOR OP) Place 1 drop into both eyes 2 (two) times daily as  needed (itchy eyes).    Marland Kitchen levothyroxine (SYNTHROID, LEVOTHROID) 75 MCG tablet Take 1 tablet (75 mcg total) by mouth daily. 90 tablet 1  . liothyronine (CYTOMEL) 25 MCG tablet TAKE 1 TABLET DAILY 90 tablet 3  . methylphenidate (RITALIN) 5 MG tablet Take 1 tablet (5 mg total) by mouth daily as needed. 30 tablet 0  . baclofen (LIORESAL) 10 MG tablet Take 1 tablet (10 mg total) by mouth 3 (three) times daily as needed for muscle spasms. 30 each 0  . aspirin EC 81 MG tablet Take 81 mg by mouth daily.    Marland Kitchen levothyroxine (SYNTHROID, LEVOTHROID) 50 MCG tablet TAKE 1 TABLET DAILY 90 tablet 3   No facility-administered medications prior to visit.     Allergies  Allergen Reactions  . Penicillins Hives    Childhood allergy  Has patient had a PCN reaction causing immediate rash, facial/tongue/throat swelling, SOB or lightheadedness with hypotension: No Has patient had a PCN reaction causing severe rash involving mucus membranes or skin necrosis: No Has patient had a PCN reaction that required hospitalization No Has patient had a PCN reaction occurring within the last 10 years: No If all of the above answers are "NO", then may proceed with Cephalosporin use.  . Sulfa Antibiotics Hives    Review of Systems  Constitutional: Negative for fever and malaise/fatigue.  HENT: Negative for congestion.   Eyes: Negative for blurred vision.  Respiratory: Negative for shortness of breath.   Cardiovascular: Negative for chest pain, palpitations and leg swelling.  Gastrointestinal: Negative for abdominal pain, blood in stool and nausea.  Genitourinary: Negative for dysuria and frequency.    Musculoskeletal: Positive for back pain, joint pain and myalgias. Negative for falls.  Skin: Negative for rash.  Neurological: Negative for dizziness, loss of consciousness and headaches.  Endo/Heme/Allergies: Negative for environmental allergies.  Psychiatric/Behavioral: Positive for depression. The patient is nervous/anxious and has insomnia.        Objective:    Physical Exam  Constitutional: She is oriented to person, place, and time. She appears well-developed and well-nourished. No distress.  HENT:  Head: Normocephalic and atraumatic.  Nose: Nose normal.  Eyes: Right eye exhibits no discharge. Left eye exhibits no discharge.  Neck: Normal range of motion. Neck supple.  Cardiovascular: Normal rate and regular rhythm.  No murmur heard. Pulmonary/Chest: Effort normal and breath sounds normal.  Abdominal: Soft. Bowel sounds are normal. There is no tenderness.  Musculoskeletal: She exhibits deformity. She exhibits no edema.  Deformity in mid back ove surgical site. Muscle spasm noted over muscles of chest wall  Neurological: She is alert and oriented to person, place, and time.  Skin: Skin is warm and dry. Rash noted.  Psychiatric: She has a normal mood and affect.  Nursing note and vitals reviewed.   BP (!) 108/58 (BP Location: Left Arm, Patient Position: Sitting, Cuff Size: Normal)   Pulse 84   Temp 97.8 F (36.6 C) (Oral)   Resp 18   Ht 5\' 4"  (1.626 m)   Wt 95 lb 3.2 oz (43.2 kg)   SpO2 98%   BMI 16.34 kg/m  Wt Readings from Last 3 Encounters:  11/25/17 95 lb 3.2 oz (43.2 kg)  10/01/17 97 lb (44 kg)  08/17/17 96 lb 3.2 oz (43.6 kg)   BP Readings from Last 3 Encounters:  11/25/17 (!) 108/58  10/01/17 114/84  08/17/17 140/78      There is no immunization history on file for this patient.  Health Maintenance  Topic Date Due  . Hepatitis C Screening  1945-10-17  . TETANUS/TDAP  04/29/1963  . DEXA SCAN  04/28/2009  . PNA vac Low Risk Adult (1 of 2 - PCV13)  04/28/2009  . INFLUENZA VACCINE  11/11/2017  . MAMMOGRAM  03/12/2019  . COLONOSCOPY  07/22/2026    Lab Results  Component Value Date   WBC 5.9 08/17/2017   HGB 13.5 08/17/2017   HCT 40.9 08/17/2017   PLT 236.0 08/17/2017   GLUCOSE 87 08/17/2017   CHOL 207 (H) 10/22/2016   TRIG 70.0 10/22/2016   HDL 70.20 10/22/2016   LDLCALC 122 (H) 10/22/2016   ALT 19 08/17/2017   AST 25 08/17/2017   NA 138 08/17/2017   K 4.0 08/17/2017   CL 100 08/17/2017   CREATININE 0.73 08/17/2017   BUN 10 08/17/2017   CO2 34 (H) 08/17/2017   TSH <0.01 (L) 08/17/2017   INR 0.95 10/29/2015    Lab Results  Component Value Date   TSH <0.01 (L) 08/17/2017   Lab Results  Component Value Date   WBC 5.9 08/17/2017   HGB 13.5 08/17/2017   HCT 40.9 08/17/2017   MCV 85.0 08/17/2017   PLT 236.0 08/17/2017   Lab Results  Component Value Date   NA 138 08/17/2017   K 4.0 08/17/2017   CO2 34 (H) 08/17/2017   GLUCOSE 87 08/17/2017   BUN 10 08/17/2017   CREATININE 0.73 08/17/2017   BILITOT 0.6 08/17/2017   ALKPHOS 57 08/17/2017   AST 25 08/17/2017   ALT 19 08/17/2017   PROT 6.8 08/17/2017   ALBUMIN 4.3 08/17/2017   CALCIUM 9.8 08/17/2017   ANIONGAP 9 11/30/2016   GFR 82.99 08/17/2017   Lab Results  Component Value Date   CHOL 207 (H) 10/22/2016   Lab Results  Component Value Date   HDL 70.20 10/22/2016   Lab Results  Component Value Date   LDLCALC 122 (H) 10/22/2016   Lab Results  Component Value Date   TRIG 70.0 10/22/2016   Lab Results  Component Value Date   CHOLHDL 3 10/22/2016   No results found for: HGBA1C       Assessment & Plan:   Problem List Items Addressed This Visit    Depression with anxiety    She acknowledges her persistent pain is worsening he rdepression will add Fluoxetine 5 mg daily to see if that helps.       Relevant Medications   FLUoxetine (PROZAC) 10 MG tablet   Back pain - Primary    She has had increased pain that has moved fom her posterior  left shoulder to her ight shoulder and across the front of her chest the past few days. If she does not move it is better but with movement causes severe pain. She is having trouble sleeping and settling down because of the pain. She has historically followed with Dr Nelva Bush and he often manages he pain. No falls or injury. She can use muscle relaxers up to 3 x a day this week and apply moist heat alternating with ice. Seek care if pain worsens.       Relevant Medications   baclofen (LIORESAL) 10 MG tablet   Other Relevant Orders   DG Thoracic Spine 2 View (Completed)      I have discontinued Caroljean L. Vogt-Nichols's aspirin EC and methylphenidate. I am also having her start on FLUoxetine. Additionally, I am having her maintain her cetirizine, b complex vitamins, cholecalciferol, DULoxetine, fluticasone, Ketotifen Fumarate (ZADITOR  OP), furosemide, liothyronine, gabapentin, HYDROcodone-acetaminophen, levothyroxine, and baclofen.  Meds ordered this encounter  Medications  . FLUoxetine (PROZAC) 10 MG tablet    Sig: Take 0.5 tablets (5 mg total) by mouth daily.    Dispense:  15 tablet    Refill:  3    CMA served as scribe during this visit. History, Physical and Plan performed by medical provider. Documentation and orders reviewed and attested to.  Penni Homans, MD

## 2017-11-25 NOTE — Patient Instructions (Addendum)
Lidocaine patches to affected area.   Back Pain, Adult Many adults have back pain from time to time. Common causes of back pain include:  A strained muscle or ligament.  Wear and tear (degeneration) of the spinal disks.  Arthritis.  A hit to the back.  Back pain can be short-lived (acute) or last a long time (chronic). A physical exam, lab tests, and imaging studies may be done to find the cause of your pain. Follow these instructions at home: Managing pain and stiffness  Take over-the-counter and prescription medicines only as told by your health care provider.  If directed, apply heat to the affected area as often as told by your health care provider. Use the heat source that your health care provider recommends, such as a moist heat pack or a heating pad. ? Place a towel between your skin and the heat source. ? Leave the heat on for 20-30 minutes. ? Remove the heat if your skin turns bright red. This is especially important if you are unable to feel pain, heat, or cold. You have a greater risk of getting burned.  If directed, apply ice to the injured area: ? Put ice in a plastic bag. ? Place a towel between your skin and the bag. ? Leave the ice on for 20 minutes, 2-3 times a day for the first 2-3 days. Activity  Do not stay in bed. Resting more than 1-2 days can delay your recovery.  Take short walks on even surfaces as soon as you are able. Try to increase the length of time you walk each day.  Do not sit, drive, or stand in one place for more than 30 minutes at a time. Sitting or standing for long periods of time can put stress on your back.  Use proper lifting techniques. When you bend and lift, use positions that put less stress on your back: ? Glen your knees. ? Keep the load close to your body. ? Avoid twisting.  Exercise regularly as told by your health care provider. Exercising will help your back heal faster. This also helps prevent back injuries by keeping  muscles strong and flexible.  Your health care provider may recommend that you see a physical therapist. This person can help you come up with a safe exercise program. Do any exercises as told by your physical therapist. Lifestyle  Maintain a healthy weight. Extra weight puts stress on your back and makes it difficult to have good posture.  Avoid activities or situations that make you feel anxious or stressed. Learn ways to manage anxiety and stress. One way to manage stress is through exercise. Stress and anxiety increase muscle tension and can make back pain worse. General instructions  Sleep on a firm mattress in a comfortable position. Try lying on your side with your knees slightly bent. If you lie on your back, put a pillow under your knees.  Follow your treatment plan as told by your health care provider. This may include: ? Cognitive or behavioral therapy. ? Acupuncture or massage therapy. ? Meditation or yoga. Contact a health care provider if:  You have pain that is not relieved with rest or medicine.  You have increasing pain going down into your legs or buttocks.  Your pain does not improve in 2 weeks.  You have pain at night.  You lose weight.  You have a fever or chills. Get help right away if:  You develop new bowel or bladder control problems.  You have  unusual weakness or numbness in your arms or legs.  You develop nausea or vomiting.  You develop abdominal pain.  You feel faint. Summary  Many adults have back pain from time to time. A physical exam, lab tests, and imaging studies may be done to find the cause of your pain.  Use proper lifting techniques. When you bend and lift, use positions that put less stress on your back.  Take over-the-counter and prescription medicines and apply heat or ice as directed by your health care provider. This information is not intended to replace advice given to you by your health care provider. Make sure you discuss  any questions you have with your health care provider. Document Released: 03/30/2005 Document Revised: 05/04/2016 Document Reviewed: 05/04/2016 Elsevier Interactive Patient Education  [employer].

## 2017-11-26 DIAGNOSIS — M549 Dorsalgia, unspecified: Secondary | ICD-10-CM | POA: Insufficient documentation

## 2017-11-26 NOTE — Assessment & Plan Note (Signed)
She has had increased pain that has moved fom her posterior left shoulder to her ight shoulder and across the front of her chest the past few days. If she does not move it is better but with movement causes severe pain. She is having trouble sleeping and settling down because of the pain. She has historically followed with Dr Nelva Bush and he often manages he pain. No falls or injury. She can use muscle relaxers up to 3 x a day this week and apply moist heat alternating with ice. Seek care if pain worsens.

## 2017-11-26 NOTE — Assessment & Plan Note (Signed)
She acknowledges her persistent pain is worsening he rdepression will add Fluoxetine 5 mg daily to see if that helps.

## 2017-11-30 ENCOUNTER — Ambulatory Visit: Payer: Medicare Other | Admitting: Family Medicine

## 2017-12-14 DIAGNOSIS — G9619 Other disorders of meninges, not elsewhere classified: Secondary | ICD-10-CM | POA: Diagnosis not present

## 2018-01-18 DIAGNOSIS — M545 Low back pain: Secondary | ICD-10-CM | POA: Diagnosis not present

## 2018-01-18 DIAGNOSIS — M5136 Other intervertebral disc degeneration, lumbar region: Secondary | ICD-10-CM | POA: Diagnosis not present

## 2018-01-18 DIAGNOSIS — G894 Chronic pain syndrome: Secondary | ICD-10-CM | POA: Diagnosis not present

## 2018-01-23 ENCOUNTER — Other Ambulatory Visit: Payer: Self-pay | Admitting: Family Medicine

## 2018-01-27 ENCOUNTER — Ambulatory Visit (INDEPENDENT_AMBULATORY_CARE_PROVIDER_SITE_OTHER): Payer: Medicare Other | Admitting: Family Medicine

## 2018-01-27 ENCOUNTER — Encounter: Payer: Self-pay | Admitting: Family Medicine

## 2018-01-27 VITALS — BP 130/78 | HR 70 | Temp 98.1°F | Resp 18 | Ht 65.0 in | Wt 94.4 lb

## 2018-01-27 DIAGNOSIS — G9619 Other disorders of meninges, not elsewhere classified: Secondary | ICD-10-CM | POA: Diagnosis not present

## 2018-01-27 DIAGNOSIS — M549 Dorsalgia, unspecified: Secondary | ICD-10-CM | POA: Diagnosis not present

## 2018-01-27 DIAGNOSIS — M419 Scoliosis, unspecified: Secondary | ICD-10-CM | POA: Diagnosis not present

## 2018-01-27 DIAGNOSIS — E079 Disorder of thyroid, unspecified: Secondary | ICD-10-CM | POA: Diagnosis not present

## 2018-01-27 DIAGNOSIS — R109 Unspecified abdominal pain: Secondary | ICD-10-CM | POA: Diagnosis not present

## 2018-01-27 DIAGNOSIS — G96198 Other disorders of meninges, not elsewhere classified: Secondary | ICD-10-CM | POA: Insufficient documentation

## 2018-01-27 DIAGNOSIS — E876 Hypokalemia: Secondary | ICD-10-CM | POA: Diagnosis not present

## 2018-01-27 DIAGNOSIS — E785 Hyperlipidemia, unspecified: Secondary | ICD-10-CM | POA: Diagnosis not present

## 2018-01-27 LAB — COMPREHENSIVE METABOLIC PANEL
ALT: 20 U/L (ref 0–35)
AST: 22 U/L (ref 0–37)
Albumin: 4.4 g/dL (ref 3.5–5.2)
Alkaline Phosphatase: 63 U/L (ref 39–117)
BILIRUBIN TOTAL: 0.5 mg/dL (ref 0.2–1.2)
BUN: 13 mg/dL (ref 6–23)
CALCIUM: 9.7 mg/dL (ref 8.4–10.5)
CO2: 31 meq/L (ref 19–32)
Chloride: 100 mEq/L (ref 96–112)
Creatinine, Ser: 0.73 mg/dL (ref 0.40–1.20)
GFR: 82.89 mL/min (ref >60.00)
Glucose, Bld: 90 mg/dL (ref 70–99)
Potassium: 3.9 mEq/L (ref 3.5–5.1)
Sodium: 139 mEq/L (ref 135–145)
Total Protein: 6.5 g/dL (ref 6.0–8.3)

## 2018-01-27 LAB — CBC WITH DIFFERENTIAL/PLATELET
BASOS ABS: 0.1 10*3/uL (ref 0.0–0.1)
BASOS PCT: 1.3 % (ref 0.0–3.0)
EOS ABS: 0.2 10*3/uL (ref 0.0–0.7)
Eosinophils Relative: 3.2 % (ref 0.0–5.0)
HEMATOCRIT: 42 % (ref 36.0–46.0)
HEMOGLOBIN: 13.6 g/dL (ref 12.0–15.0)
LYMPHS ABS: 1.6 10*3/uL (ref 0.7–4.0)
LYMPHS PCT: 25.4 % (ref 12.0–46.0)
MCHC: 32.4 g/dL (ref 30.0–36.0)
MCV: 84.5 fl (ref 78.0–100.0)
MONO ABS: 0.5 10*3/uL (ref 0.1–1.0)
Monocytes Relative: 8.2 % (ref 3.0–12.0)
NEUTROS ABS: 3.8 10*3/uL (ref 1.4–7.7)
Neutrophils Relative %: 61.9 % (ref 43.0–77.0)
PLATELETS: 265 10*3/uL (ref 150.0–400.0)
RBC: 4.98 Mil/uL (ref 3.87–5.11)
RDW: 14.5 % (ref 11.5–15.5)
WBC: 6.2 10*3/uL (ref 4.0–10.5)

## 2018-01-27 LAB — LIPID PANEL
CHOL/HDL RATIO: 3
Cholesterol: 224 mg/dL — ABNORMAL HIGH (ref 0–200)
HDL: 77.6 mg/dL (ref >39.00)
LDL Cholesterol: 134 mg/dL — ABNORMAL HIGH (ref 0–99)
NONHDL: 146.86
TRIGLYCERIDES: 64 mg/dL (ref 0.0–149.0)
VLDL: 12.8 mg/dL (ref 0.0–40.0)

## 2018-01-27 LAB — TSH

## 2018-01-27 NOTE — Assessment & Plan Note (Signed)
Has surgery at [facility] on 02/16/18 with Dr Prince Rome

## 2018-01-27 NOTE — Progress Notes (Signed)
Subjective:    [name], female    DOB: [birth year], 72 y.o.   MRN: [id number]  No chief complaint on file.   HPI Patient is in today for follow up. She has found a neurosurgeon who is willing to explaring a delisno  nevera thing Past Medical History:  Diagnosis Date  . Abdominal pain 02/01/2015  . Allergy    "seasonal allergies"  . Anemia   . Arthritis    osteoarthritis-"DDD" hips. fingers, toes.  . Blood transfusion without reported diagnosis   . Complication of anesthesia   . Depression   . Depression with anxiety   . Frequent headaches    no problems now.  Marland Kitchen Hearing loss in right ear 03/10/2015   same over last 6 months  . Hip dislocation, right ([place])   . History of chicken pox   . Hyperlipidemia    pt. state no hx  . Hypokalemia 02/04/2017  . Hypothyroidism    supplement used  . Osteoporosis   . Peripheral neuropathy 02/01/2015   right hand"carpal tunnel"  . PONV (postoperative nausea and vomiting)   . Preventative health care 02/04/2017  . Right hip pain 05/24/2015  . Scoliosis 02/01/2015  . Spinal stenosis of lumbar region 10/23/2016  . Thyroid disease   . URI, acute 03/10/2015  . UTI (urinary tract infection) 12/14/2016  . 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 ACETABULAR REVISION Right 10/30/2015   Procedure: RIGHT HIP ACETABULAR REVISION /CONSTRAINED LINER (POSTERIOR APPROACH) ;  Surgeon: Gaynelle Arabian, MD;  Location: [facility];  Service: Orthopedics;  Laterality: Right;  . BREAST EXCISIONAL BIOPSY Right   . BREAST SURGERY     lump removed, and breast reduction  . EYE SURGERY     lasik   2002  . HIP CLOSED REDUCTION Right 06/30/2015   Procedure: CLOSED MANIPULATION HIP;  Surgeon: Melina Schools, MD;  Location: [facility];  Service: Orthopedics;  Laterality: Right;  . JOINT REPLACEMENT     3 hips on right and 1 on left  . METACARPOPHALANGEAL JOINT CAPSULECTOMY /  CAPSULOTOMY  1995  . REDUCTION MAMMAPLASTY    . SPINAL CORD STIMULATOR INSERTION N/A 12/02/2016   Procedure: LUMBAR SPINAL CORD STIMULATOR INSERTION;  Surgeon: Melina Schools, MD;  Location: [place];  Service: Orthopedics;  Laterality: N/A;  2.5 hrs  . SPINAL CORD STIMULATOR REMOVAL N/A 12/31/2016   Procedure: LUMBAR SPINAL CORD STIMULATOR REMOVAL;  Surgeon: Melina Schools, MD;  Location: [place];  Service: Orthopedics;  Laterality: N/A;  60 mins  . TONSILLECTOMY    . TOTAL HIP ARTHROPLASTY  2011   left hip  . TOTAL HIP ARTHROPLASTY Right 06/12/2015   Procedure: RIGHT TOTAL HIP ARTHROPLASTY ANTERIOR APPROACH;  Surgeon: Gaynelle Arabian, MD;  Location: [facility];  Service: Orthopedics;  Laterality: Right;  . TOTAL HIP REVISION Right 07/19/2015   Procedure: RIGHT HIP BEARING SURFACE REVISION;  Surgeon: Gaynelle Arabian, MD;  Location: [facility];  Service: Orthopedics;  Laterality: Right;  . TUBAL LIGATION  1980   left fallopian tube removal    Family History  Problem Relation Age of Onset  . Arthritis Mother   . Dementia Mother   . Mental illness Father        suicide  . Arthritis Maternal Grandmother   . Kidney disease Son        b/l multicystic kidney disease s/p 4 transplants  .  Colon cancer Neg Hx     Social History   Socioeconomic History  . Marital status: Married    Spouse name: Not on file  . Number of children: Not on file  . Years of education: 48  . Highest education level: Not on file  Occupational History  . Occupation: retired  Scientific laboratory technician  . Financial resource strain: Not on file  . Food insecurity:    Worry: Not on file    Inability: Not on file  . Transportation needs:    Medical: Not on file    Non-medical: Not on file  Tobacco Use  . Smoking status: Never Smoker  . Smokeless tobacco: Never Used  Substance and Sexual Activity  . Alcohol use: Yes    Alcohol/week: 1.0 standard drinks    Types: 1 Glasses of wine per week  . Drug use: No  . 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 Physical activity:    Days per week: Not on file    Minutes per session: Not on file  . Stress: Not on file  Relationships  . Social connections:    Talks on phone: Not on file    Gets together: Not on file    Attends religious service: Not on file    Active member of club or organization: Not on file    Attends meetings of clubs or organizations: Not on file    Relationship status: Not on file  . Intimate partner violence:    Fear of current or ex partner: Not on file    Emotionally abused: Not on file    Physically abused: Not on file    Forced sexual activity: Not on file  Other Topics Concern  . Not on file  Social History Narrative  . Not on file    Outpatient Medications Prior to Visit  Medication Sig Dispense Refill  . b complex vitamins tablet Take 1 tablet by mouth daily.    . baclofen (LIORESAL) 10 MG tablet Take 1 tablet (10 mg total) by mouth 3 (three) times daily as needed for muscle spasms. 30 each 0  . cetirizine (ZYRTEC) 10 MG tablet Take 10 mg by mouth daily.     . cholecalciferol (VITAMIN D) 1000 units tablet Take 1,000 Units by mouth daily.    . DULoxetine (CYMBALTA) 60 MG capsule Take 60 mg by mouth daily.    Marland Kitchen FLUoxetine (PROZAC) 10 MG tablet Take 0.5 tablets (5 mg total) by mouth daily. 15 tablet 3  . fluticasone (FLONASE) 50 MCG/ACT nasal spray Place 1 spray into both nostrils daily as needed for allergies or rhinitis.    . furosemide (LASIX) 20 MG tablet TAKE 1 TABLET DAILY 90 tablet 4  . gabapentin (NEURONTIN) 300 MG capsule TAKE 1 CAPSULE AT BEDTIME 90 capsule 3  . HYDROcodone-acetaminophen (NORCO) 7.5-325 MG tablet Take 1 tablet by mouth 2 (two) times daily as needed.  0  . Ketotifen Fumarate (ZADITOR OP) Place 1 drop into both eyes 2 (two) times daily as needed (itchy eyes).    Marland Kitchen levothyroxine (SYNTHROID, LEVOTHROID) 75 MCG tablet Take 1 tablet (75 mcg  total) by mouth daily. 90 tablet 1  . liothyronine (CYTOMEL) 25 MCG tablet TAKE 1 TABLET DAILY 90 tablet 3   No facility-administered medications prior to visit.     Allergies  Allergen Reactions  . Penicillins Hives    Childhood allergy  Has patient had a PCN reaction causing immediate rash, facial/tongue/throat swelling, SOB or lightheadedness with hypotension: No Has patient had a PCN reaction causing severe rash involving mucus membranes or skin necrosis: No Has patient had a PCN reaction that required hospitalization No Has patient had a PCN reaction occurring within the last 10 years: No If all of the above answers are "NO", then may proceed with Cephalosporin use.  . Sulfa Antibiotics Hives    Review of Systems  Constitutional: Negative for fever and malaise/fatigue.  HENT: Negative for congestion.   Eyes: Negative for blurred vision.  Respiratory: Negative for shortness of breath.   Cardiovascular: Negative for chest pain, palpitations and leg swelling.  Gastrointestinal: Negative for abdominal pain, blood in stool and nausea.  Genitourinary: Negative for dysuria and frequency.  Musculoskeletal: Positive for back pain. Negative for falls.  Skin: Negative for rash.  Neurological: Negative for dizziness, loss of consciousness and headaches.  Endo/Heme/Allergies: Negative for environmental allergies.  Psychiatric/Behavioral: Negative for depression. The patient is not nervous/anxious.        Objective:    Physical Exam  Constitutional: She is oriented to person, place, and time. She appears well-developed and well-nourished. No distress.  HENT:  Head: Normocephalic and atraumatic.  Nose: Nose normal.  Eyes: Right eye exhibits no discharge. Left eye exhibits no discharge.  Neck: Normal range of motion. Neck supple.  Cardiovascular: Normal rate and regular rhythm.  No murmur heard. Pulmonary/Chest: Effort normal and breath sounds normal.  Abdominal: Soft. Bowel sounds  are normal. There is no tenderness.  Musculoskeletal: She exhibits no edema.  Neurological: She is alert and oriented to person, place, and time.  Skin: Skin is warm and dry.  Psychiatric: She has a normal mood and affect.  Nursing note and vitals reviewed.   BP 130/78 (BP Location: Left Arm, Patient Position: Sitting, Cuff Size: Small)   Pulse 70   Temp 98.1 F (36.7 C) (Oral)   Resp 18   Ht 5\' 5"  (1.651 m)   Wt 94 lb 6.4 oz (42.8 kg)   SpO2 91%   BMI 15.71 kg/m  Wt Readings from Last 3 Encounters:  01/27/18 94 lb 6.4 oz (42.8 kg)  11/25/17 95 lb 3.2 oz (43.2 kg)  10/01/17 97 lb (44 kg)     Lab Results  Component Value Date   WBC 5.9 08/17/2017   HGB 13.5 08/17/2017   HCT 40.9 08/17/2017   PLT 236.0 08/17/2017   GLUCOSE 87 08/17/2017   CHOL 207 (H) 10/22/2016   TRIG 70.0 10/22/2016   HDL 70.20 10/22/2016   LDLCALC 122 (H) 10/22/2016   ALT 19 08/17/2017   AST 25 08/17/2017   NA 138 08/17/2017   K 4.0 08/17/2017   CL 100 08/17/2017   CREATININE 0.73 08/17/2017   BUN 10 08/17/2017   CO2 34 (H) 08/17/2017   TSH <0.01 (L) 08/17/2017   INR 0.95 10/29/2015    Lab Results  Component Value Date   TSH <0.01 (L) 08/17/2017   Lab Results  Component Value Date   WBC 5.9 08/17/2017   HGB 13.5 08/17/2017   HCT 40.9 08/17/2017   MCV 85.0 08/17/2017   PLT 236.0 08/17/2017   Lab Results  Component Value Date   NA 138 08/17/2017   K 4.0 08/17/2017   CO2 34 (H) 08/17/2017   GLUCOSE 87 08/17/2017   BUN 10 08/17/2017   CREATININE 0.73 08/17/2017   BILITOT 0.6 08/17/2017   ALKPHOS 57 08/17/2017   AST  25 08/17/2017   ALT 19 08/17/2017   PROT 6.8 08/17/2017   ALBUMIN 4.3 08/17/2017   CALCIUM 9.8 08/17/2017   ANIONGAP 9 11/30/2016   GFR 82.99 08/17/2017   Lab Results  Component Value Date   CHOL 207 (H) 10/22/2016   Lab Results  Component Value Date   HDL 70.20 10/22/2016   Lab Results  Component Value Date   LDLCALC 122 (H) 10/22/2016   Lab Results    Component Value Date   TRIG 70.0 10/22/2016   Lab Results  Component Value Date   CHOLHDL 3 10/22/2016   No results found for: HGBA1C     Assessment & Plan:   Problem List Items Addressed This Visit    Hyperlipidemia   Relevant Orders   Lipid panel   Thyroid disease - Primary    Check tsh      Relevant Orders   TSH   Abdominal pain   Relevant Orders   CBC with Differential/Platelet   Comprehensive metabolic panel   Scoliosis   Relevant Orders   CBC with Differential/Platelet   Hypokalemia    Check cmp      Back pain    Managing but struggles daily.  moist heat. prescription meds as directed and report if symptoms worsen or seek immediate care      Pseudomeningocele of spinal cord    Has surgery at [facility] on 02/16/18 with Dr Prince Rome         I am having Retal L. Vogt-Nichols maintain her cetirizine, b complex vitamins, cholecalciferol, DULoxetine, fluticasone, Ketotifen Fumarate (ZADITOR OP), liothyronine, gabapentin, HYDROcodone-acetaminophen, levothyroxine, baclofen, FLUoxetine, and furosemide.  No orders of the defined types were placed in this encounter.    Penni Homans, MD

## 2018-01-27 NOTE — Assessment & Plan Note (Signed)
Check tsh 

## 2018-01-27 NOTE — Patient Instructions (Signed)

## 2018-01-27 NOTE — Assessment & Plan Note (Signed)
Check cmp 

## 2018-01-27 NOTE — Assessment & Plan Note (Signed)
Managing but struggles daily.  moist heat. prescription meds as directed and report if symptoms worsen or seek immediate care

## 2018-02-01 MED ORDER — LEVOTHYROXINE SODIUM 50 MCG PO TABS: 50.0000 ug | ORAL_TABLET | Freq: Every day | ORAL | 3 refills | Status: DC

## 2018-02-01 NOTE — Addendum Note (Signed)
Addended by: Magdalene Molly A on: 02/01/2018 09:43 AM   Modules accepted: Orders

## 2018-02-10 DIAGNOSIS — Z01818 Encounter for other preprocedural examination: Secondary | ICD-10-CM | POA: Diagnosis not present

## 2018-02-10 DIAGNOSIS — G9619 Other disorders of meninges, not elsewhere classified: Secondary | ICD-10-CM | POA: Diagnosis not present

## 2018-02-16 DIAGNOSIS — G9619 Other disorders of meninges, not elsewhere classified: Secondary | ICD-10-CM | POA: Diagnosis not present

## 2018-02-16 DIAGNOSIS — M419 Scoliosis, unspecified: Secondary | ICD-10-CM | POA: Diagnosis present

## 2018-02-16 DIAGNOSIS — M810 Age-related osteoporosis without current pathological fracture: Secondary | ICD-10-CM | POA: Diagnosis present

## 2018-02-16 DIAGNOSIS — G8929 Other chronic pain: Secondary | ICD-10-CM | POA: Diagnosis present

## 2018-02-16 DIAGNOSIS — G970 Cerebrospinal fluid leak from spinal puncture: Secondary | ICD-10-CM | POA: Diagnosis not present

## 2018-02-16 DIAGNOSIS — J309 Allergic rhinitis, unspecified: Secondary | ICD-10-CM | POA: Diagnosis present

## 2018-02-16 DIAGNOSIS — M1990 Unspecified osteoarthritis, unspecified site: Secondary | ICD-10-CM | POA: Diagnosis present

## 2018-02-16 DIAGNOSIS — M48061 Spinal stenosis, lumbar region without neurogenic claudication: Secondary | ICD-10-CM | POA: Diagnosis present

## 2018-02-16 DIAGNOSIS — E039 Hypothyroidism, unspecified: Secondary | ICD-10-CM | POA: Diagnosis present

## 2018-02-16 DIAGNOSIS — G9782 Other postprocedural complications and disorders of nervous system: Secondary | ICD-10-CM | POA: Diagnosis not present

## 2018-02-16 DIAGNOSIS — H906 Mixed conductive and sensorineural hearing loss, bilateral: Secondary | ICD-10-CM | POA: Diagnosis present

## 2018-02-16 DIAGNOSIS — H6983 Other specified disorders of Eustachian tube, bilateral: Secondary | ICD-10-CM | POA: Diagnosis present

## 2018-02-27 ENCOUNTER — Other Ambulatory Visit: Payer: Self-pay | Admitting: Family Medicine

## 2018-03-01 DIAGNOSIS — Z4789 Encounter for other orthopedic aftercare: Secondary | ICD-10-CM | POA: Diagnosis not present

## 2018-03-09 ENCOUNTER — Other Ambulatory Visit: Payer: Self-pay | Admitting: Family Medicine

## 2018-03-31 DIAGNOSIS — Z48811 Encounter for surgical aftercare following surgery on the nervous system: Secondary | ICD-10-CM | POA: Diagnosis not present

## 2018-03-31 DIAGNOSIS — Z9889 Other specified postprocedural states: Secondary | ICD-10-CM | POA: Diagnosis not present

## 2018-03-31 DIAGNOSIS — G9619 Other disorders of meninges, not elsewhere classified: Secondary | ICD-10-CM | POA: Diagnosis not present

## 2018-04-11 DIAGNOSIS — M4316 Spondylolisthesis, lumbar region: Secondary | ICD-10-CM | POA: Diagnosis not present

## 2018-04-11 DIAGNOSIS — M48062 Spinal stenosis, lumbar region with neurogenic claudication: Secondary | ICD-10-CM | POA: Diagnosis not present

## 2018-04-18 ENCOUNTER — Encounter: Payer: Self-pay | Admitting: Family Medicine

## 2018-04-18 ENCOUNTER — Ambulatory Visit (INDEPENDENT_AMBULATORY_CARE_PROVIDER_SITE_OTHER): Payer: Medicare Other | Admitting: Family Medicine

## 2018-04-18 VITALS — BP 108/62 | HR 62 | Temp 97.8°F | Ht 65.0 in | Wt 94.5 lb

## 2018-04-18 DIAGNOSIS — J0100 Acute maxillary sinusitis, unspecified: Secondary | ICD-10-CM

## 2018-04-18 MED ORDER — DOXYCYCLINE HYCLATE 100 MG PO TABS: 100.0000 mg | ORAL_TABLET | Freq: Two times a day (BID) | ORAL | 0 refills | Status: AC

## 2018-04-18 MED ORDER — METHYLPREDNISOLONE ACETATE 80 MG/ML IJ SUSP
80.0000 mg | Freq: Once | INTRAMUSCULAR | Status: AC
  Administered 2018-04-18: 80 mg via INTRAMUSCULAR

## 2018-04-18 NOTE — Addendum Note (Signed)
Addended by: Sharon Seller B on: 04/18/2018 10:46 AM   Modules accepted: Orders

## 2018-04-18 NOTE — Progress Notes (Signed)
Pre visit review using our clinic review tool, if applicable. No additional management support is needed unless otherwise documented below in the visit note. 

## 2018-04-18 NOTE — Progress Notes (Addendum)
Chief Complaint  Patient presents with  . Sinusitis    for one week  . Headache  . Dizziness    Misty Barker here for URI complaints.  Duration: 1 week  Associated symptoms: Fever (101 F max), sinus congestion, sinus pain, hearing loss, rhinorrhea, sore throat, shortness of breath and cough Denies: itchy watery eyes, ear pain, ear drainage, wheezing and myalgia  Treatment to date: Tylenol, saline rinse, warm compresses Sick contacts: Yes  ROS:  Const: Denies current fevers HEENT: As noted in HPI Lungs: No SOB  Past Medical History:  Diagnosis Date  . Abdominal pain 02/01/2015  . Allergy    "seasonal allergies"  . Anemia   . Arthritis    osteoarthritis-"DDD" hips. fingers, toes.  . Blood transfusion without reported diagnosis   . Complication of anesthesia   . Depression   . Depression with anxiety   . Frequent headaches    no problems now.  Marland Kitchen Hearing loss in right ear 03/10/2015   same over last 6 months  . Hip dislocation, right ([place])   . History of chicken pox   . Hyperlipidemia    pt. state no hx  . Hypokalemia 02/04/2017  . Hypothyroidism    supplement used  . Osteoporosis   . Peripheral neuropathy 02/01/2015   right hand"carpal tunnel"  . PONV (postoperative nausea and vomiting)   . Preventative health care 02/04/2017  . Right hip pain 05/24/2015  . Scoliosis 02/01/2015  . Spinal stenosis of lumbar region 10/23/2016  . Thyroid disease   . URI, acute 03/10/2015  . UTI (urinary tract infection) 12/14/2016  . 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    BP 108/62 (BP Location: Left Arm, Patient Position: Sitting, Cuff Size: Normal)   Pulse 62   Temp 97.8 F (36.6 C) (Oral)   Ht 5\' 5"  (1.651 m)   Wt 94 lb 8 oz (42.9 kg)   SpO2 96%   BMI 15.73 kg/m  General: Awake, alert, cachectic HEENT: AT, [place], ears patent b/l and TM's neg, nares patent w/o discharge, max sinuses ttp b/l, pharynx pink and without exudates, MMM Neck: No masses or  asymmetry Heart: RRR Lungs: CTAB, no accessory muscle use Psych: Age appropriate judgment and insight, normal mood and affect  Acute maxillary sinusitis, recurrence not specified - Plan: doxycycline (VIBRA-TABS) 100 MG tablet  Pocket rx as above. Depomedrol IM for likely ETD.  Continue to push fluids, practice good hand hygiene, cover mouth when coughing. F/u prn. If starting to experience fevers, shaking, or shortness of breath, seek immediate care. Pt voiced understanding and agreement to the plan.  [place], DO 04/18/18 10:29 AM

## 2018-04-18 NOTE — Patient Instructions (Signed)
Continue to push fluids, practice good hand hygiene, and cover your mouth if you cough.  If you start having fevers, shaking or shortness of breath, seek immediate care.  Most sinus infections are viral in etiology and antibiotics will not be helpful. That being said, if you start having worsening symptoms over 3 days, you are worsening by day 10 or not improving by day 14, go ahead and take it. You are on Day 7 as of now. If fever returns, take medicine.  Let us know if you need anything.

## 2018-04-19 DIAGNOSIS — Z79899 Other long term (current) drug therapy: Secondary | ICD-10-CM | POA: Diagnosis not present

## 2018-04-19 DIAGNOSIS — G894 Chronic pain syndrome: Secondary | ICD-10-CM | POA: Diagnosis not present

## 2018-04-19 DIAGNOSIS — Z5181 Encounter for therapeutic drug level monitoring: Secondary | ICD-10-CM | POA: Diagnosis not present

## 2018-04-26 DIAGNOSIS — G9619 Other disorders of meninges, not elsewhere classified: Secondary | ICD-10-CM | POA: Diagnosis not present

## 2018-04-26 DIAGNOSIS — F418 Other specified anxiety disorders: Secondary | ICD-10-CM | POA: Diagnosis not present

## 2018-04-26 DIAGNOSIS — D649 Anemia, unspecified: Secondary | ICD-10-CM | POA: Diagnosis not present

## 2018-04-26 DIAGNOSIS — E039 Hypothyroidism, unspecified: Secondary | ICD-10-CM | POA: Diagnosis not present

## 2018-04-26 DIAGNOSIS — Z01810 Encounter for preprocedural cardiovascular examination: Secondary | ICD-10-CM | POA: Diagnosis not present

## 2018-04-26 DIAGNOSIS — Z01818 Encounter for other preprocedural examination: Secondary | ICD-10-CM | POA: Diagnosis not present

## 2018-04-26 DIAGNOSIS — M419 Scoliosis, unspecified: Secondary | ICD-10-CM | POA: Diagnosis not present

## 2018-04-26 DIAGNOSIS — E785 Hyperlipidemia, unspecified: Secondary | ICD-10-CM | POA: Diagnosis not present

## 2018-04-29 ENCOUNTER — Ambulatory Visit: Payer: Medicare Other | Admitting: Family Medicine

## 2018-05-05 DIAGNOSIS — Z882 Allergy status to sulfonamides status: Secondary | ICD-10-CM | POA: Diagnosis not present

## 2018-05-05 DIAGNOSIS — M545 Low back pain: Secondary | ICD-10-CM | POA: Diagnosis not present

## 2018-05-05 DIAGNOSIS — M48061 Spinal stenosis, lumbar region without neurogenic claudication: Secondary | ICD-10-CM | POA: Diagnosis not present

## 2018-05-05 DIAGNOSIS — Z880 Allergy status to penicillin: Secondary | ICD-10-CM | POA: Diagnosis not present

## 2018-05-05 DIAGNOSIS — M4316 Spondylolisthesis, lumbar region: Secondary | ICD-10-CM | POA: Diagnosis not present

## 2018-05-10 DIAGNOSIS — M79661 Pain in right lower leg: Secondary | ICD-10-CM | POA: Diagnosis not present

## 2018-05-10 DIAGNOSIS — M79662 Pain in left lower leg: Secondary | ICD-10-CM | POA: Diagnosis not present

## 2018-06-20 ENCOUNTER — Encounter: Payer: Self-pay | Admitting: Family Medicine

## 2018-06-20 ENCOUNTER — Ambulatory Visit (INDEPENDENT_AMBULATORY_CARE_PROVIDER_SITE_OTHER): Payer: Medicare Other | Admitting: Family Medicine

## 2018-06-20 VITALS — BP 122/68 | HR 75 | Temp 97.6°F | Resp 18 | Wt 95.2 lb

## 2018-06-20 DIAGNOSIS — E785 Hyperlipidemia, unspecified: Secondary | ICD-10-CM | POA: Diagnosis not present

## 2018-06-20 DIAGNOSIS — M25551 Pain in right hip: Secondary | ICD-10-CM | POA: Diagnosis not present

## 2018-06-20 DIAGNOSIS — M549 Dorsalgia, unspecified: Secondary | ICD-10-CM

## 2018-06-20 DIAGNOSIS — E079 Disorder of thyroid, unspecified: Secondary | ICD-10-CM

## 2018-06-20 DIAGNOSIS — E2839 Other primary ovarian failure: Secondary | ICD-10-CM | POA: Diagnosis not present

## 2018-06-20 DIAGNOSIS — R002 Palpitations: Secondary | ICD-10-CM

## 2018-06-20 DIAGNOSIS — M48061 Spinal stenosis, lumbar region without neurogenic claudication: Secondary | ICD-10-CM | POA: Diagnosis not present

## 2018-06-20 LAB — COMPREHENSIVE METABOLIC PANEL
ALBUMIN: 4.3 g/dL (ref 3.5–5.2)
ALT: 16 U/L (ref 0–35)
AST: 21 U/L (ref 0–37)
Alkaline Phosphatase: 62 U/L (ref 39–117)
BUN: 11 mg/dL (ref 6–23)
CALCIUM: 9.9 mg/dL (ref 8.4–10.5)
CHLORIDE: 99 meq/L (ref 96–112)
CO2: 33 meq/L — AB (ref 19–32)
Creatinine, Ser: 0.77 mg/dL (ref 0.40–1.20)
GFR: 73.25 mL/min (ref >60.00)
Glucose, Bld: 75 mg/dL (ref 70–99)
Potassium: 4.2 mEq/L (ref 3.5–5.1)
SODIUM: 139 meq/L (ref 135–145)
Total Bilirubin: 0.4 mg/dL (ref 0.2–1.2)
Total Protein: 6.4 g/dL (ref 6.0–8.3)

## 2018-06-20 LAB — CBC
HEMATOCRIT: 37.7 % (ref 36.0–46.0)
Hemoglobin: 12.4 g/dL (ref 12.0–15.0)
MCHC: 32.9 g/dL (ref 30.0–36.0)
MCV: 84.6 fl (ref 78.0–100.0)
Platelets: 332 10*3/uL (ref 150.0–400.0)
RBC: 4.46 Mil/uL (ref 3.87–5.11)
RDW: 16.1 % — ABNORMAL HIGH (ref 11.5–15.5)
WBC: 7.9 10*3/uL (ref 4.0–10.5)

## 2018-06-20 LAB — LIPID PANEL
CHOLESTEROL: 240 mg/dL — AB (ref 0–200)
HDL: 86.2 mg/dL (ref >39.00)
LDL CALC: 136 mg/dL — AB (ref 0–99)
NonHDL: 153.33
TRIGLYCERIDES: 88 mg/dL (ref 0.0–149.0)
Total CHOL/HDL Ratio: 3
VLDL: 17.6 mg/dL (ref 0.0–40.0)

## 2018-06-20 LAB — TSH: TSH: <0.01 u[IU]/mL — ABNORMAL LOW (ref 0.35–4.50)

## 2018-06-20 MED ORDER — GABAPENTIN 300 MG PO CAPS: 300.0000 mg | ORAL_CAPSULE | Freq: Every day | ORAL | 3 refills | Status: DC

## 2018-06-20 NOTE — Assessment & Plan Note (Signed)
Encouraged heart healthy diet, increase exercise, avoid trans fats, consider a krill oil cap daily 

## 2018-06-20 NOTE — Patient Instructions (Signed)
Consider increasing the Gabapentin to 300 mg twice daily x 1 week then increase to 300 mg three x daily and can go up from there as needed  Soma can increase to a full tab at bedtime  The goal would be to try and get rid of th Hydrocodone  Add the Lidocaine patches to the flank and thigh at bedtime. [employer], [employer] and [employer] all make a lidocaine version now. Chronic Back Pain When back pain lasts longer than 3 months, it is called chronic back pain.The cause of your back pain may not be known. Some common causes include:  Wear and tear (degenerative disease) of the bones, ligaments, or disks in your back.  Inflammation and stiffness in your back (arthritis). People who have chronic back pain often go through certain periods in which the pain is more intense (flare-ups). Many people can learn to manage the pain with home care. Follow these instructions at home: Pay attention to any changes in your symptoms. Take these actions to help with your pain: Activity   Avoid bending and other activities that make the problem worse.  Maintain a proper position when standing or sitting: ? When standing, keep your upper back and neck straight, with your shoulders pulled back. Avoid slouching. ? When sitting, keep your back straight and relax your shoulders. Do not round your shoulders or pull them backward.  Do not sit or stand in one place for long periods of time.  Take brief periods of rest throughout the day. This will reduce your pain. Resting in a lying or standing position is usually better than sitting to rest.  When you are resting for longer periods, mix in some mild activity or stretching between periods of rest. This will help to prevent stiffness and pain.  Get regular exercise. Ask your health care provider what activities are safe for you.  Do not lift anything that is heavier than 10 lb (4.5 kg). Always use proper lifting technique, which includes: ? Bending your  knees. ? Keeping the load close to your body. ? Avoiding twisting.  Sleep on a firm mattress in a comfortable position. Try lying on your side with your knees slightly bent. If you lie on your back, put a pillow under your knees. Managing pain  If directed, apply ice to the painful area. Your health care provider may recommend applying ice during the first 24-48 hours after a flare-up begins. ? Put ice in a plastic bag. ? Place a towel between your skin and the bag. ? Leave the ice on for 20 minutes, 2-3 times per day.  If directed, apply heat to the affected area as often as told by your health care provider. Use the heat source that your health care provider recommends, such as a moist heat pack or a heating pad. ? Place a towel between your skin and the heat source. ? Leave the heat on for 20-30 minutes. ? Remove the heat if your skin turns bright red. This is especially important if you are unable to feel pain, heat, or cold. You may have a greater risk of getting burned.  Try soaking in a warm tub.  Take over-the-counter and prescription medicines only as told by your health care provider.  Keep all follow-up visits as told by your health care provider. This is important. Contact a health care provider if:  You have pain that is not relieved with rest or medicine. Get help right away if:  You have weakness  or numbness in one or both of your legs or feet.  You have trouble controlling your bladder or your bowels.  You have nausea or vomiting.  You have pain in your abdomen.  You have shortness of breath or you faint. This information is not intended to replace advice given to you by your health care provider. Make sure you discuss any questions you have with your health care provider. Document Released: 05/07/2004 Document Revised: 11/04/2017 Document Reviewed: 10/07/2016 Elsevier Interactive Patient Education  2019 [employer].

## 2018-06-20 NOTE — Assessment & Plan Note (Signed)
Encouraged moist heat and gentle stretching as tolerated. May try NSAIDs and prescription meds as directed and report if symptoms worsen or seek immediate care 

## 2018-06-20 NOTE — Assessment & Plan Note (Signed)
Has notable pain over lateral right thigh.

## 2018-06-20 NOTE — Assessment & Plan Note (Signed)
Check TSH and continue Levothyroxine

## 2018-06-26 NOTE — Progress Notes (Signed)
Subjective:    Patient ID: NAZIA RHINES, female    DOB: [birth year], 73 y.o.   MRN: [id number]  No chief complaint on file.   HPI Patient is in today for follow up. She continues to struggle with daily back pain and she wishes she had not had the most recent surgeries because it was not helpful. She is also noting increased tightness and pain in her right hip as well. No recent febrile illness or hospitalizations. Denies CP/palp/SOB/HA/congestion/fevers/GI or GU c/o. Taking meds as prescribed  Past Medical History:  Diagnosis Date  . Abdominal pain 02/01/2015  . Allergy    "seasonal allergies"  . Anemia   . Arthritis    osteoarthritis-"DDD" hips. fingers, toes.  . Blood transfusion without reported diagnosis   . Complication of anesthesia   . Depression   . Depression with anxiety   . Frequent headaches    no problems now.  Marland Kitchen Hearing loss in right ear 03/10/2015   same over last 6 months  . Hip dislocation, right ([place])   . History of chicken pox   . Hyperlipidemia    pt. state no hx  . Hypokalemia 02/04/2017  . Hypothyroidism    supplement used  . Osteoporosis   . Peripheral neuropathy 02/01/2015   right hand"carpal tunnel"  . PONV (postoperative nausea and vomiting)   . Preventative health care 02/04/2017  . Right hip pain 05/24/2015  . Scoliosis 02/01/2015  . Spinal stenosis of lumbar region 10/23/2016  . Thyroid disease   . URI, acute 03/10/2015  . UTI (urinary tract infection) 12/14/2016  . 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 ACETABULAR REVISION Right 10/30/2015   Procedure: RIGHT HIP ACETABULAR REVISION /CONSTRAINED LINER (POSTERIOR APPROACH) ;  Surgeon: Gaynelle Arabian, MD;  Location: [facility];  Service: Orthopedics;  Laterality: Right;  . BREAST EXCISIONAL BIOPSY Right   . BREAST SURGERY     lump removed, and breast reduction  . EYE SURGERY     lasik   2002  . HIP CLOSED REDUCTION  Right 06/30/2015   Procedure: CLOSED MANIPULATION HIP;  Surgeon: Melina Schools, MD;  Location: [facility];  Service: Orthopedics;  Laterality: Right;  . JOINT REPLACEMENT     3 hips on right and 1 on left  . METACARPOPHALANGEAL JOINT CAPSULECTOMY / CAPSULOTOMY  1995  . REDUCTION MAMMAPLASTY    . SPINAL CORD STIMULATOR INSERTION N/A 12/02/2016   Procedure: LUMBAR SPINAL CORD STIMULATOR INSERTION;  Surgeon: Melina Schools, MD;  Location: [facility];  Service: Orthopedics;  Laterality: N/A;  2.5 hrs  . SPINAL CORD STIMULATOR REMOVAL N/A 12/31/2016   Procedure: LUMBAR SPINAL CORD STIMULATOR REMOVAL;  Surgeon: Melina Schools, MD;  Location: [facility];  Service: Orthopedics;  Laterality: N/A;  60 mins  . TONSILLECTOMY    . TOTAL HIP ARTHROPLASTY  2011   left hip  . TOTAL HIP ARTHROPLASTY Right 06/12/2015   Procedure: RIGHT TOTAL HIP ARTHROPLASTY ANTERIOR APPROACH;  Surgeon: Gaynelle Arabian, MD;  Location: [facility];  Service: Orthopedics;  Laterality: Right;  . TOTAL HIP REVISION Right 07/19/2015   Procedure: RIGHT HIP BEARING SURFACE REVISION;  Surgeon: Gaynelle Arabian, MD;  Location: [facility];  Service: Orthopedics;  Laterality: Right;  . TUBAL LIGATION  1980   left fallopian tube removal    Family History  Problem Relation Age of Onset  . Arthritis Mother   . Dementia Mother   .  Mental illness Father        suicide  . Arthritis Maternal Grandmother   . Kidney disease Son        b/l multicystic kidney disease s/p 4 transplants  . Colon cancer Neg Hx     Social History   Socioeconomic History  . Marital status: Married    Spouse name: Not on file  . Number of children: Not on file  . Years of education: 5  . Highest education level: Not on file  Occupational History  . Occupation: retired  Scientific laboratory technician  . Financial resource strain: Not on file  . Food insecurity:    Worry: Not on file    Inability: Not on file  . Transportation needs:    Medical: Not on file    Non-medical: Not on file  Tobacco Use   . Smoking status: Never Smoker  . Smokeless tobacco: Never Used  Substance and Sexual Activity  . Alcohol use: Yes    Alcohol/week: 1.0 standard drinks    Types: 1 Glasses of wine per week  . Drug use: No  . 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 Physical activity:    Days per week: Not on file    Minutes per session: Not on file  . Stress: Not on file  Relationships  . Social connections:    Talks on phone: Not on file    Gets together: Not on file    Attends religious service: Not on file    Active member of club or organization: Not on file    Attends meetings of clubs or organizations: Not on file    Relationship status: Not on file  . Intimate partner violence:    Fear of current or ex partner: Not on file    Emotionally abused: Not on file    Physically abused: Not on file    Forced sexual activity: Not on file  Other Topics Concern  . Not on file  Social History Narrative  . Not on file    Outpatient Medications Prior to Visit  Medication Sig Dispense Refill  . b complex vitamins tablet Take 1 tablet by mouth daily.    . cetirizine (ZYRTEC) 10 MG tablet Take 10 mg by mouth daily.     . cholecalciferol (VITAMIN D) 1000 units tablet Take 1,000 Units by mouth daily.    . DULoxetine (CYMBALTA) 60 MG capsule Take 60 mg by mouth daily.    . fluticasone (FLONASE) 50 MCG/ACT nasal spray Place 1 spray into both nostrils daily as needed for allergies or rhinitis.    . furosemide (LASIX) 20 MG tablet TAKE 1 TABLET DAILY 90 tablet 4  . HYDROcodone-acetaminophen (NORCO) 7.5-325 MG tablet Take 1 tablet by mouth 2 (two) times daily as needed.  0  . Ketotifen Fumarate (ZADITOR OP) Place 1 drop into both eyes 2 (two) times daily as needed (itchy eyes).    Marland Kitchen levothyroxine (SYNTHROID, LEVOTHROID) 50 MCG tablet Take 1 tablet (50 mcg total) by mouth daily. 90 tablet 3  . liothyronine (CYTOMEL) 25  MCG tablet TAKE 1 TABLET DAILY 90 tablet 4  . baclofen (LIORESAL) 10 MG tablet Take 1 tablet (10 mg total) by mouth 3 (three) times daily as needed for muscle spasms. 30 each 0  . FLUoxetine (PROZAC) 10 MG tablet Take 0.5 tablets (5 mg total) by mouth daily. 45 tablet 1  . gabapentin (  NEURONTIN) 300 MG capsule TAKE 1 CAPSULE AT BEDTIME 90 capsule 3   No facility-administered medications prior to visit.     Allergies  Allergen Reactions  . Penicillins Hives    Childhood allergy  Has patient had a PCN reaction causing immediate rash, facial/tongue/throat swelling, SOB or lightheadedness with hypotension: No Has patient had a PCN reaction causing severe rash involving mucus membranes or skin necrosis: No Has patient had a PCN reaction that required hospitalization No Has patient had a PCN reaction occurring within the last 10 years: No If all of the above answers are "NO", then may proceed with Cephalosporin use.  . Sulfa Antibiotics Hives    Review of Systems  Constitutional: Positive for malaise/fatigue. Negative for fever.  HENT: Negative for congestion.   Eyes: Negative for blurred vision.  Respiratory: Negative for shortness of breath.   Cardiovascular: Negative for chest pain, palpitations and leg swelling.  Gastrointestinal: Negative for abdominal pain, blood in stool and nausea.  Genitourinary: Negative for dysuria and frequency.  Musculoskeletal: Positive for back pain and joint pain. Negative for falls.  Skin: Negative for rash.  Neurological: Negative for dizziness, loss of consciousness and headaches.  Endo/Heme/Allergies: Negative for environmental allergies.  Psychiatric/Behavioral: Negative for depression. The patient is not nervous/anxious.        Objective:    Physical Exam Vitals signs and nursing note reviewed.  Constitutional:      General: She is not in acute distress.    Appearance: She is well-developed.     Comments: frail  HENT:     Head:  Normocephalic and atraumatic.     Nose: Nose normal.  Eyes:     General:        Right eye: No discharge.        Left eye: No discharge.  Neck:     Musculoskeletal: Normal range of motion and neck supple.  Cardiovascular:     Rate and Rhythm: Normal rate and regular rhythm.     Heart sounds: No murmur.  Pulmonary:     Effort: Pulmonary effort is normal.     Breath sounds: Normal breath sounds.  Abdominal:     General: Bowel sounds are normal.     Palpations: Abdomen is soft.     Tenderness: There is no abdominal tenderness.  Skin:    General: Skin is warm and dry.  Neurological:     Mental Status: She is alert and oriented to person, place, and time.     BP 122/68 (BP Location: Left Arm, Patient Position: Sitting, Cuff Size: Normal)   Pulse 75   Temp 97.6 F (36.4 C) (Oral)   Resp 18   Wt 95 lb 3.2 oz (43.2 kg)   SpO2 96%   BMI 15.84 kg/m  Wt Readings from Last 3 Encounters:  06/20/18 95 lb 3.2 oz (43.2 kg)  04/18/18 94 lb 8 oz (42.9 kg)  01/27/18 94 lb 6.4 oz (42.8 kg)     Lab Results  Component Value Date   WBC 7.9 06/20/2018   HGB 12.4 06/20/2018   HCT 37.7 06/20/2018   PLT 332.0 06/20/2018   GLUCOSE 75 06/20/2018   CHOL 240 (H) 06/20/2018   TRIG 88.0 06/20/2018   HDL 86.20 06/20/2018   LDLCALC 136 (H) 06/20/2018   ALT 16 06/20/2018   AST 21 06/20/2018   NA 139 06/20/2018   K 4.2 06/20/2018   CL 99 06/20/2018   CREATININE 0.77 06/20/2018   BUN 11 06/20/2018  CO2 33 (H) 06/20/2018   TSH <0.01 (L) 06/20/2018   INR 0.95 10/29/2015    Lab Results  Component Value Date   TSH <0.01 (L) 06/20/2018   Lab Results  Component Value Date   WBC 7.9 06/20/2018   HGB 12.4 06/20/2018   HCT 37.7 06/20/2018   MCV 84.6 06/20/2018   PLT 332.0 06/20/2018   Lab Results  Component Value Date   NA 139 06/20/2018   K 4.2 06/20/2018   CO2 33 (H) 06/20/2018   GLUCOSE 75 06/20/2018   BUN 11 06/20/2018   CREATININE 0.77 06/20/2018   BILITOT 0.4 06/20/2018    ALKPHOS 62 06/20/2018   AST 21 06/20/2018   ALT 16 06/20/2018   PROT 6.4 06/20/2018   ALBUMIN 4.3 06/20/2018   CALCIUM 9.9 06/20/2018   ANIONGAP 9 11/30/2016   GFR 73.25 06/20/2018   Lab Results  Component Value Date   CHOL 240 (H) 06/20/2018   Lab Results  Component Value Date   HDL 86.20 06/20/2018   Lab Results  Component Value Date   LDLCALC 136 (H) 06/20/2018   Lab Results  Component Value Date   TRIG 88.0 06/20/2018   Lab Results  Component Value Date   CHOLHDL 3 06/20/2018   No results found for: HGBA1C     Assessment & Plan:   Problem List Items Addressed This Visit    Hyperlipidemia    Encouraged heart healthy diet, increase exercise, avoid trans fats, consider a krill oil cap daily      Relevant Orders   Comprehensive metabolic panel (Completed)   Lipid panel (Completed)   Thyroid disease    Check TSH and continue Levothyroxine      Relevant Orders   TSH (Completed)   Right hip pain    Encouraged moist heat and gentle stretching as tolerated. May try NSAIDs and prescription meds as directed and report if symptoms worsen or seek immediate care      Spinal stenosis of lumbar region    She continues to struggle with daily pain and she wishes she had not had her last couple of surgeries. Encouraged moist heat and gentle stretching as tolerated. May try NSAIDs and prescription meds as directed and report if symptoms worsen or seek immediate care      Palpitations    No recent flares      Back pain    Encouraged moist heat and gentle stretching as tolerated. May try NSAIDs and prescription meds as directed and report if symptoms worsen or seek immediate care      Relevant Orders   CBC (Completed)    Other Visit Diagnoses    Estrogen deficiency    -  Primary   Relevant Orders   DG Bone Density      I have discontinued Deamber L. Vogt-Nichols "Connie"'s baclofen and FLUoxetine. I have also changed her gabapentin. Additionally, I am having  her maintain her cetirizine, b complex vitamins, cholecalciferol, DULoxetine, fluticasone, Ketotifen Fumarate (ZADITOR OP), HYDROcodone-acetaminophen, furosemide, levothyroxine, and liothyronine.  Meds ordered this encounter  Medications  . gabapentin (NEURONTIN) 300 MG capsule    Sig: Take 1 capsule (300 mg total) by mouth at bedtime.    Dispense:  90 capsule    Refill:  3     Penni Homans, MD

## 2018-06-26 NOTE — Assessment & Plan Note (Signed)
Encouraged moist heat and gentle stretching as tolerated. May try NSAIDs and prescription meds as directed and report if symptoms worsen or seek immediate care 

## 2018-06-26 NOTE — Assessment & Plan Note (Signed)
No recent flares 

## 2018-06-26 NOTE — Assessment & Plan Note (Signed)
She continues to struggle with daily pain and she wishes she had not had her last couple of surgeries. Encouraged moist heat and gentle stretching as tolerated. May try NSAIDs and prescription meds as directed and report if symptoms worsen or seek immediate care

## 2018-08-16 DIAGNOSIS — M546 Pain in thoracic spine: Secondary | ICD-10-CM | POA: Diagnosis not present

## 2018-08-16 DIAGNOSIS — M62830 Muscle spasm of back: Secondary | ICD-10-CM | POA: Diagnosis not present

## 2018-08-16 DIAGNOSIS — G894 Chronic pain syndrome: Secondary | ICD-10-CM | POA: Diagnosis not present

## 2018-09-13 DIAGNOSIS — H10413 Chronic giant papillary conjunctivitis, bilateral: Secondary | ICD-10-CM | POA: Diagnosis not present

## 2018-09-13 DIAGNOSIS — D3131 Benign neoplasm of right choroid: Secondary | ICD-10-CM | POA: Diagnosis not present

## 2018-09-13 DIAGNOSIS — H2513 Age-related nuclear cataract, bilateral: Secondary | ICD-10-CM | POA: Diagnosis not present

## 2018-09-13 DIAGNOSIS — H40013 Open angle with borderline findings, low risk, bilateral: Secondary | ICD-10-CM | POA: Diagnosis not present

## 2018-09-14 ENCOUNTER — Ambulatory Visit (HOSPITAL_BASED_OUTPATIENT_CLINIC_OR_DEPARTMENT_OTHER)
Admission: RE | Admit: 2018-09-14 | Discharge: 2018-09-14 | Disposition: A | Discharge status: Home / Self Care | Payer: Medicare Other | Source: Ambulatory Visit | Attending: Family Medicine | Admitting: Family Medicine

## 2018-09-14 ENCOUNTER — Other Ambulatory Visit: Payer: Self-pay

## 2018-09-14 ENCOUNTER — Other Ambulatory Visit (HOSPITAL_BASED_OUTPATIENT_CLINIC_OR_DEPARTMENT_OTHER): Payer: Self-pay | Admitting: Neurosurgery

## 2018-09-14 ENCOUNTER — Ambulatory Visit (HOSPITAL_BASED_OUTPATIENT_CLINIC_OR_DEPARTMENT_OTHER)
Admission: RE | Admit: 2018-09-14 | Discharge: 2018-09-14 | Disposition: A | Discharge status: Home / Self Care | Payer: Medicare Other | Source: Ambulatory Visit | Attending: Neurosurgery | Admitting: Neurosurgery

## 2018-09-14 DIAGNOSIS — M545 Low back pain: Secondary | ICD-10-CM | POA: Diagnosis not present

## 2018-09-14 DIAGNOSIS — M810 Age-related osteoporosis without current pathological fracture: Secondary | ICD-10-CM | POA: Diagnosis not present

## 2018-09-14 DIAGNOSIS — M4316 Spondylolisthesis, lumbar region: Secondary | ICD-10-CM

## 2018-09-14 DIAGNOSIS — E2839 Other primary ovarian failure: Secondary | ICD-10-CM | POA: Diagnosis not present

## 2018-09-20 ENCOUNTER — Other Ambulatory Visit: Payer: Self-pay

## 2018-09-20 ENCOUNTER — Ambulatory Visit (INDEPENDENT_AMBULATORY_CARE_PROVIDER_SITE_OTHER): Payer: Medicare Other | Admitting: Family Medicine

## 2018-09-20 ENCOUNTER — Encounter: Payer: Self-pay | Admitting: Family Medicine

## 2018-09-20 DIAGNOSIS — M549 Dorsalgia, unspecified: Secondary | ICD-10-CM | POA: Diagnosis not present

## 2018-09-20 DIAGNOSIS — G4700 Insomnia, unspecified: Secondary | ICD-10-CM | POA: Diagnosis not present

## 2018-09-20 DIAGNOSIS — F418 Other specified anxiety disorders: Secondary | ICD-10-CM

## 2018-09-20 MED ORDER — DIAZEPAM 5 MG PO TABS: 2.5000 mg | ORAL_TABLET | Freq: Every evening | ORAL | 1 refills | Status: DC | PRN

## 2018-09-20 MED ORDER — TIZANIDINE HCL 2 MG PO TABS: 1.0000 mg | ORAL_TABLET | Freq: Three times a day (TID) | ORAL | 1 refills | Status: DC | PRN

## 2018-09-20 NOTE — Assessment & Plan Note (Signed)
Has continued to follow with Dr Carloyn Manner and they have exhausted all of her surgical option, she follows with pain management and takes hydrocodoneAPAP 7.5/325, half tab p.o. q. 7 AM and 3 PM and then tizanidine 4 mg tablet half tab nightly.  Also gabapentin 300 mg nightly.  Unfortunately her pain still remains debilitating at times and she has trouble sleeping.  We will try changing tizanidine to a 2 mg tab in half or take half a tab so just 1 mg up to 3 times daily and see if that is helpful and not too sedating.

## 2018-09-20 NOTE — Assessment & Plan Note (Signed)
Pain is not helping her depression anxiety but overall she feels she is managing it well.

## 2018-09-20 NOTE — Progress Notes (Signed)
Virtual Visit via Video Note  I connected with Misty Barker on 09/20/18 at  9:20 AM EDT by a video enabled telemedicine application and verified that I am speaking with the correct person using two identifiers.  Location: Patient: home Provider: home   I discussed the limitations of evaluation and management by telemedicine and the availability of in person appointments. The patient expressed understanding and agreed to proceed. Magdalene Molly, CMA was able to get the patient set up on a video visit.     Subjective:    [name], female    DOB: 1946-03-12, 73 y.o.   MRN: [id number]  No chief complaint on file.   HPI Patient is in today for follow-up on chronic medical concerns including chronic back pain. Has continued to follow with Dr Carloyn Manner and they have exhausted all of her surgical option, she follows with pain management and takes hydrocodoneAPAP 7.5/325, half tab p.o. q. 7 AM and 3 PM and then tizanidine 4 mg tablet half tab nightly.  Also gabapentin 300 mg nightly.  Unfortunately her pain still remains debilitating at times and she has trouble sleeping.  No recent fall or injury.  No incontinence.  Her right-sided pain is worsening and she has trouble sleeping and getting comfortable.. Denies CP/palp/SOB/HA/congestion/fevers/GI or GU c/o. Taking meds as prescribed  Past Medical History:  Diagnosis Date  . Abdominal pain 02/01/2015  . Allergy    "seasonal allergies"  . Anemia   . Arthritis    osteoarthritis-"DDD" hips. fingers, toes.  . Blood transfusion without reported diagnosis   . Complication of anesthesia   . Depression   . Depression with anxiety   . Frequent headaches    no problems now.  Marland Kitchen Hearing loss in right ear 03/10/2015   same over last 6 months  . Hip dislocation, right ([place])   . History of chicken pox   . Hyperlipidemia    pt. state no hx  . Hypokalemia 02/04/2017  . Hypothyroidism    supplement used  .  Osteoporosis   . Peripheral neuropathy 02/01/2015   right hand"carpal tunnel"  . PONV (postoperative nausea and vomiting)   . Preventative health care 02/04/2017  . Right hip pain 05/24/2015  . Scoliosis 02/01/2015  . Spinal stenosis of lumbar region 10/23/2016  . Thyroid disease   . URI, acute 03/10/2015  . UTI (urinary tract infection) 12/14/2016  . 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 ACETABULAR REVISION Right 10/30/2015   Procedure: RIGHT HIP ACETABULAR REVISION /CONSTRAINED LINER (POSTERIOR APPROACH) ;  Surgeon: Gaynelle Arabian, MD;  Location: [facility];  Service: Orthopedics;  Laterality: Right;  . BREAST EXCISIONAL BIOPSY Right   . BREAST SURGERY     lump removed, and breast reduction  . EYE SURGERY     lasik   2002  . HIP CLOSED REDUCTION Right 06/30/2015   Procedure: CLOSED MANIPULATION HIP;  Surgeon: Melina Schools, MD;  Location: [facility];  Service: Orthopedics;  Laterality: Right;  . JOINT REPLACEMENT     3 hips on right and 1 on left  . METACARPOPHALANGEAL JOINT CAPSULECTOMY / CAPSULOTOMY  1995  . REDUCTION MAMMAPLASTY    . SPINAL CORD STIMULATOR INSERTION N/A 12/02/2016   Procedure: LUMBAR SPINAL CORD STIMULATOR INSERTION;  Surgeon: Melina Schools, MD;  Location: [facility];  Service: Orthopedics;  Laterality: N/A;  2.5 hrs  . SPINAL CORD STIMULATOR REMOVAL N/A  12/31/2016   Procedure: LUMBAR SPINAL CORD STIMULATOR REMOVAL;  Surgeon: Melina Schools, MD;  Location: [facility];  Service: Orthopedics;  Laterality: N/A;  60 mins  . TONSILLECTOMY    . TOTAL HIP ARTHROPLASTY  2011   left hip  . TOTAL HIP ARTHROPLASTY Right 06/12/2015   Procedure: RIGHT TOTAL HIP ARTHROPLASTY ANTERIOR APPROACH;  Surgeon: Gaynelle Arabian, MD;  Location: [facility];  Service: Orthopedics;  Laterality: Right;  . TOTAL HIP REVISION Right 07/19/2015   Procedure: RIGHT HIP BEARING SURFACE REVISION;  Surgeon: Gaynelle Arabian, MD;  Location: [facility];  Service:  Orthopedics;  Laterality: Right;  . TUBAL LIGATION  1980   left fallopian tube removal    Family History  Problem Relation Age of Onset  . Arthritis Mother   . Dementia Mother   . Mental illness Father        suicide  . Arthritis Maternal Grandmother   . Kidney disease Son        b/l multicystic kidney disease s/p 4 transplants  . Colon cancer Neg Hx     Social History   Socioeconomic History  . Marital status: Married    Spouse name: Not on file  . Number of children: Not on file  . Years of education: 79  . Highest education level: Not on file  Occupational History  . Occupation: retired  Scientific laboratory technician  . Financial resource strain: Not on file  . Food insecurity:    Worry: Not on file    Inability: Not on file  . Transportation needs:    Medical: Not on file    Non-medical: Not on file  Tobacco Use  . Smoking status: Never Smoker  . Smokeless tobacco: Never Used  Substance and Sexual Activity  . Alcohol use: Yes    Alcohol/week: 1.0 standard drinks    Types: 1 Glasses of wine per week  . Drug use: No  . 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 Physical activity:    Days per week: Not on file    Minutes per session: Not on file  . Stress: Not on file  Relationships  . Social connections:    Talks on phone: Not on file    Gets together: Not on file    Attends religious service: Not on file    Active member of club or organization: Not on file    Attends meetings of clubs or organizations: Not on file    Relationship status: Not on file  . Intimate partner violence:    Fear of current or ex partner: Not on file    Emotionally abused: Not on file    Physically abused: Not on file    Forced sexual activity: Not on file  Other Topics Concern  . Not on file  Social History Narrative  . Not on file    Outpatient Medications Prior to Visit  Medication Sig Dispense Refill   . b complex vitamins tablet Take 1 tablet by mouth daily.    . cetirizine (ZYRTEC) 10 MG tablet Take 10 mg by mouth daily.     . cholecalciferol (VITAMIN D) 1000 units tablet Take 1,000 Units by mouth daily.    . DULoxetine (CYMBALTA) 60 MG capsule Take 60 mg by mouth daily.    . fluticasone (FLONASE) 50 MCG/ACT nasal spray Place 1 spray into both nostrils daily as needed for allergies or rhinitis.    Marland Kitchen  furosemide (LASIX) 20 MG tablet TAKE 1 TABLET DAILY 90 tablet 4  . gabapentin (NEURONTIN) 300 MG capsule Take 1 capsule (300 mg total) by mouth at bedtime. 90 capsule 3  . HYDROcodone-acetaminophen (NORCO) 7.5-325 MG tablet Take 1 tablet by mouth 2 (two) times daily as needed.  0  . Ketotifen Fumarate (ZADITOR OP) Place 1 drop into both eyes 2 (two) times daily as needed (itchy eyes).    Marland Kitchen levothyroxine (SYNTHROID, LEVOTHROID) 50 MCG tablet Take 1 tablet (50 mcg total) by mouth daily. 90 tablet 3  . liothyronine (CYTOMEL) 25 MCG tablet TAKE 1 TABLET DAILY 90 tablet 4   No facility-administered medications prior to visit.     Allergies  Allergen Reactions  . Penicillins Hives    Childhood allergy  Has patient had a PCN reaction causing immediate rash, facial/tongue/throat swelling, SOB or lightheadedness with hypotension: No Has patient had a PCN reaction causing severe rash involving mucus membranes or skin necrosis: No Has patient had a PCN reaction that required hospitalization No Has patient had a PCN reaction occurring within the last 10 years: No If all of the above answers are "NO", then may proceed with Cephalosporin use.  . Sulfa Antibiotics Hives    Review of Systems  Constitutional: Negative for fever and malaise/fatigue.  HENT: Negative for congestion.   Eyes: Negative for blurred vision.  Respiratory: Negative for shortness of breath.   Cardiovascular: Negative for chest pain, palpitations and leg swelling.  Gastrointestinal: Negative for abdominal pain, blood in stool  and nausea.  Genitourinary: Negative for dysuria and frequency.  Musculoskeletal: Positive for back pain. Negative for falls.  Skin: Negative for rash.  Neurological: Negative for dizziness, loss of consciousness and headaches.  Endo/Heme/Allergies: Negative for environmental allergies.  Psychiatric/Behavioral: Negative for depression. The patient is nervous/anxious and has insomnia.        Objective:    Physical Exam Constitutional:      Appearance: Normal appearance. She is not ill-appearing.  HENT:     Head: Normocephalic and atraumatic.     Nose: Nose normal.  Pulmonary:     Effort: Pulmonary effort is normal.  Neurological:     Mental Status: She is alert and oriented to person, place, and time.     BP 126/67 (BP Location: Left Arm, Patient Position: Sitting, Cuff Size: Normal)   Wt 96 lb 3.2 oz (43.6 kg)   BMI 16.01 kg/m  Wt Readings from Last 3 Encounters:  09/20/18 96 lb 3.2 oz (43.6 kg)  06/20/18 95 lb 3.2 oz (43.2 kg)  04/18/18 94 lb 8 oz (42.9 kg)    Diabetic Foot Exam - Simple   No data filed     Lab Results  Component Value Date   WBC 7.9 06/20/2018   HGB 12.4 06/20/2018   HCT 37.7 06/20/2018   PLT 332.0 06/20/2018   GLUCOSE 75 06/20/2018   CHOL 240 (H) 06/20/2018   TRIG 88.0 06/20/2018   HDL 86.20 06/20/2018   LDLCALC 136 (H) 06/20/2018   ALT 16 06/20/2018   AST 21 06/20/2018   NA 139 06/20/2018   K 4.2 06/20/2018   CL 99 06/20/2018   CREATININE 0.77 06/20/2018   BUN 11 06/20/2018   CO2 33 (H) 06/20/2018   TSH <0.01 (L) 06/20/2018   INR 0.95 10/29/2015    Lab Results  Component Value Date   TSH <0.01 (L) 06/20/2018   Lab Results  Component Value Date   WBC 7.9 06/20/2018   HGB 12.4  06/20/2018   HCT 37.7 06/20/2018   MCV 84.6 06/20/2018   PLT 332.0 06/20/2018   Lab Results  Component Value Date   NA 139 06/20/2018   K 4.2 06/20/2018   CO2 33 (H) 06/20/2018   GLUCOSE 75 06/20/2018   BUN 11 06/20/2018   CREATININE 0.77  06/20/2018   BILITOT 0.4 06/20/2018   ALKPHOS 62 06/20/2018   AST 21 06/20/2018   ALT 16 06/20/2018   PROT 6.4 06/20/2018   ALBUMIN 4.3 06/20/2018   CALCIUM 9.9 06/20/2018   ANIONGAP 9 11/30/2016   GFR 73.25 06/20/2018   Lab Results  Component Value Date   CHOL 240 (H) 06/20/2018   Lab Results  Component Value Date   HDL 86.20 06/20/2018   Lab Results  Component Value Date   LDLCALC 136 (H) 06/20/2018   Lab Results  Component Value Date   TRIG 88.0 06/20/2018   Lab Results  Component Value Date   CHOLHDL 3 06/20/2018   No results found for: HGBA1C     Assessment & Plan:   Problem List Items Addressed This Visit    Depression with anxiety    Pain is not helping her depression anxiety but overall she feels she is managing it well.      Relevant Medications   diazepam (VALIUM) 5 MG tablet   Back pain    Has continued to follow with Dr Carloyn Manner and they have exhausted all of her surgical option, she follows with pain management and takes hydrocodoneAPAP 7.5/325, half tab p.o. q. 7 AM and 3 PM and then tizanidine 4 mg tablet half tab nightly.  Also gabapentin 300 mg nightly.  Unfortunately her pain still remains debilitating at times and she has trouble sleeping.  We will try changing tizanidine to a 2 mg tab in half or take half a tab so just 1 mg up to 3 times daily and see if that is helpful and not too sedating.      Relevant Medications   tiZANidine (ZANAFLEX) 2 MG tablet   Insomnia    Encouraged good sleep hygiene such as dark, quiet room. No blue/green glowing lights such as computer screens in bedroom. No alcohol or stimulants in evening. Cut down on caffeine as able. Regular exercise is helpful but not just prior to bed time.  The only thing she has found that helps her insomnia is low doses of diazepam which she was given in the past and she has been using sparingly.  She has 5 mg tablets and she takes a half a tablet nightly as needed for insomnia with good results  and no hypersomnolence.  She is aware she cannot take diazepam and hydrocodone near each other and she is encouraged to let her husband manage the diazepam so there is no confusion but she is allowed to use half tablet nightly         I am having Iolani L. Barker "Marlowe Kays" start on diazepam and tiZANidine. I am also having her maintain her cetirizine, b complex vitamins, cholecalciferol, DULoxetine, fluticasone, Ketotifen Fumarate (ZADITOR OP), HYDROcodone-acetaminophen, furosemide, levothyroxine, liothyronine, and gabapentin.  Meds ordered this encounter  Medications  . diazepam (VALIUM) 5 MG tablet    Sig: Take 0.5 tablets (2.5 mg total) by mouth at bedtime as needed for anxiety.    Dispense:  15 tablet    Refill:  1  . tiZANidine (ZANAFLEX) 2 MG tablet    Sig: Take 0.5 tablets (1 mg total) by mouth every 8 (  eight) hours as needed for muscle spasms.    Dispense:  45 tablet    Refill:  1      I discussed the assessment and treatment plan with the patient. The patient was provided an opportunity to ask questions and all were answered. The patient agreed with the plan and demonstrated an understanding of the instructions.   The patient was advised to call back or seek an in-person evaluation if the symptoms worsen or if the condition fails to improve as anticipated.  I provided 25 minutes of non-face-to-face time during this encounter.   Penni Homans, MD

## 2018-09-20 NOTE — Assessment & Plan Note (Signed)
Encouraged good sleep hygiene such as dark, quiet room. No blue/green glowing lights such as computer screens in bedroom. No alcohol or stimulants in evening. Cut down on caffeine as able. Regular exercise is helpful but not just prior to bed time.  The only thing she has found that helps her insomnia is low doses of diazepam which she was given in the past and she has been using sparingly.  She has 5 mg tablets and she takes a half a tablet nightly as needed for insomnia with good results and no hypersomnolence.  She is aware she cannot take diazepam and hydrocodone near each other and she is encouraged to let her husband manage the diazepam so there is no confusion but she is allowed to use half tablet nightly

## 2018-09-21 DIAGNOSIS — M4316 Spondylolisthesis, lumbar region: Secondary | ICD-10-CM | POA: Diagnosis not present

## 2018-10-28 DIAGNOSIS — M545 Low back pain: Secondary | ICD-10-CM | POA: Diagnosis not present

## 2018-10-28 DIAGNOSIS — M5136 Other intervertebral disc degeneration, lumbar region: Secondary | ICD-10-CM | POA: Diagnosis not present

## 2018-10-28 DIAGNOSIS — G894 Chronic pain syndrome: Secondary | ICD-10-CM | POA: Diagnosis not present

## 2018-10-28 DIAGNOSIS — M7061 Trochanteric bursitis, right hip: Secondary | ICD-10-CM | POA: Diagnosis not present

## 2018-11-25 DIAGNOSIS — M5416 Radiculopathy, lumbar region: Secondary | ICD-10-CM | POA: Diagnosis not present

## 2018-11-25 DIAGNOSIS — G894 Chronic pain syndrome: Secondary | ICD-10-CM | POA: Diagnosis not present

## 2018-12-02 DIAGNOSIS — G894 Chronic pain syndrome: Secondary | ICD-10-CM | POA: Diagnosis not present

## 2018-12-22 ENCOUNTER — Other Ambulatory Visit: Payer: Self-pay

## 2018-12-22 ENCOUNTER — Ambulatory Visit (INDEPENDENT_AMBULATORY_CARE_PROVIDER_SITE_OTHER): Payer: Medicare Other

## 2018-12-22 ENCOUNTER — Encounter: Payer: Self-pay | Admitting: Family Medicine

## 2018-12-22 DIAGNOSIS — Z23 Encounter for immunization: Secondary | ICD-10-CM | POA: Diagnosis not present

## 2018-12-29 DIAGNOSIS — M5416 Radiculopathy, lumbar region: Secondary | ICD-10-CM | POA: Diagnosis not present

## 2019-01-04 ENCOUNTER — Other Ambulatory Visit: Payer: Self-pay | Admitting: Family Medicine

## 2019-03-07 ENCOUNTER — Other Ambulatory Visit: Payer: Self-pay

## 2019-04-18 ENCOUNTER — Other Ambulatory Visit: Payer: Self-pay | Admitting: Family Medicine

## 2019-05-06 ENCOUNTER — Other Ambulatory Visit: Payer: Self-pay | Admitting: Family Medicine

## 2019-05-08 IMAGING — RF DG C-ARM 61-120 MIN
1 series · 2 of 2 positions shown · non-contrast
Comparison: None.

CLINICAL DATA: Spinal cord stimulator placement

EXAM:
THORACOLUMBAR SPINE 1V

[Series 1: run · 2 of 2 slices shown]
[im 1/2]
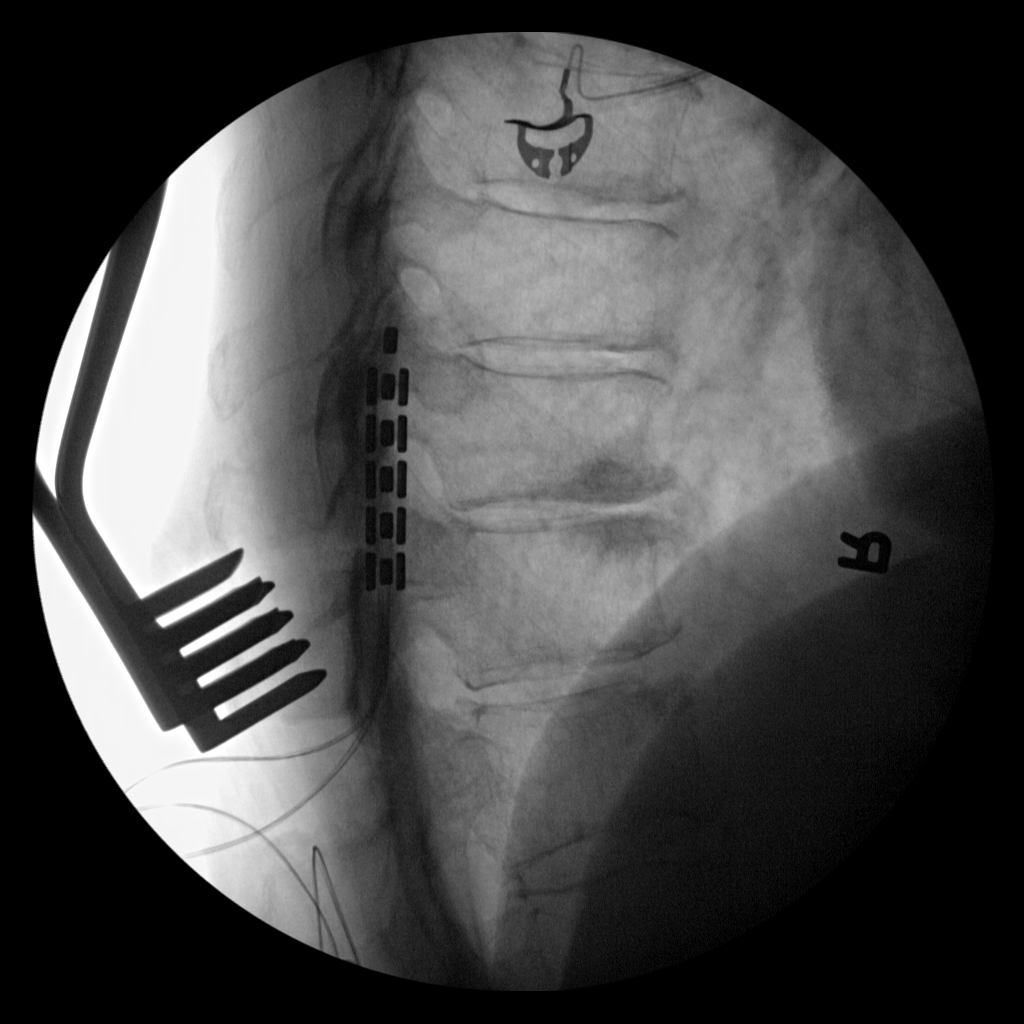
[im 2/2]
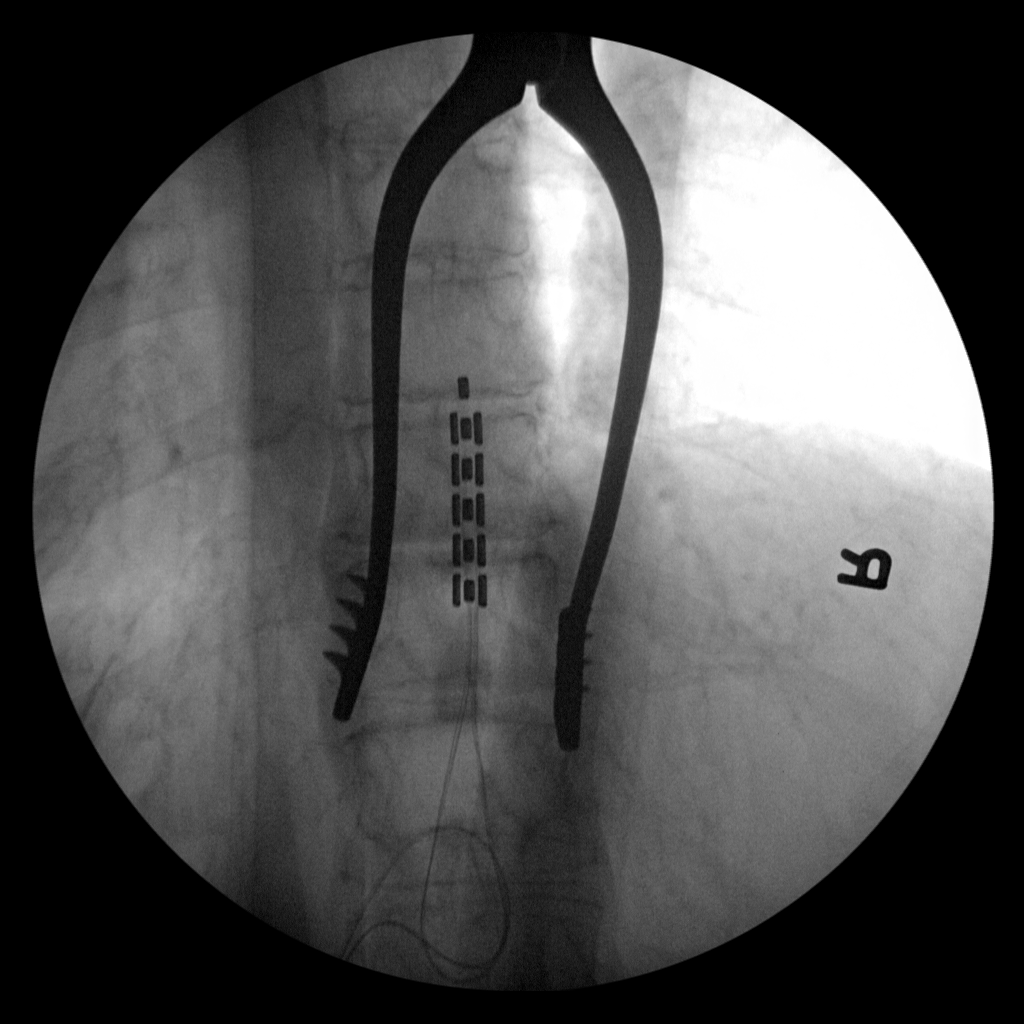

[2 of 2 positions shown; findings below may reference images not displayed]

FINDINGS: Two intraoperative spot images demonstrate placement of spinal cord
stimulator in the lower thoracic region. No complicating feature
visualized.
IMPRESSION: Spinal cord stimulator placement in the lower thoracic region.

## 2019-05-09 IMAGING — DX DG ABDOMEN 1V
1 series · 1 of 1 positions shown · non-contrast
Comparison: 12/03/2014

CLINICAL DATA: Abdominal pain and abdominal distention.

EXAM:
ABDOMEN - 1 VIEW

[abdomen kub]
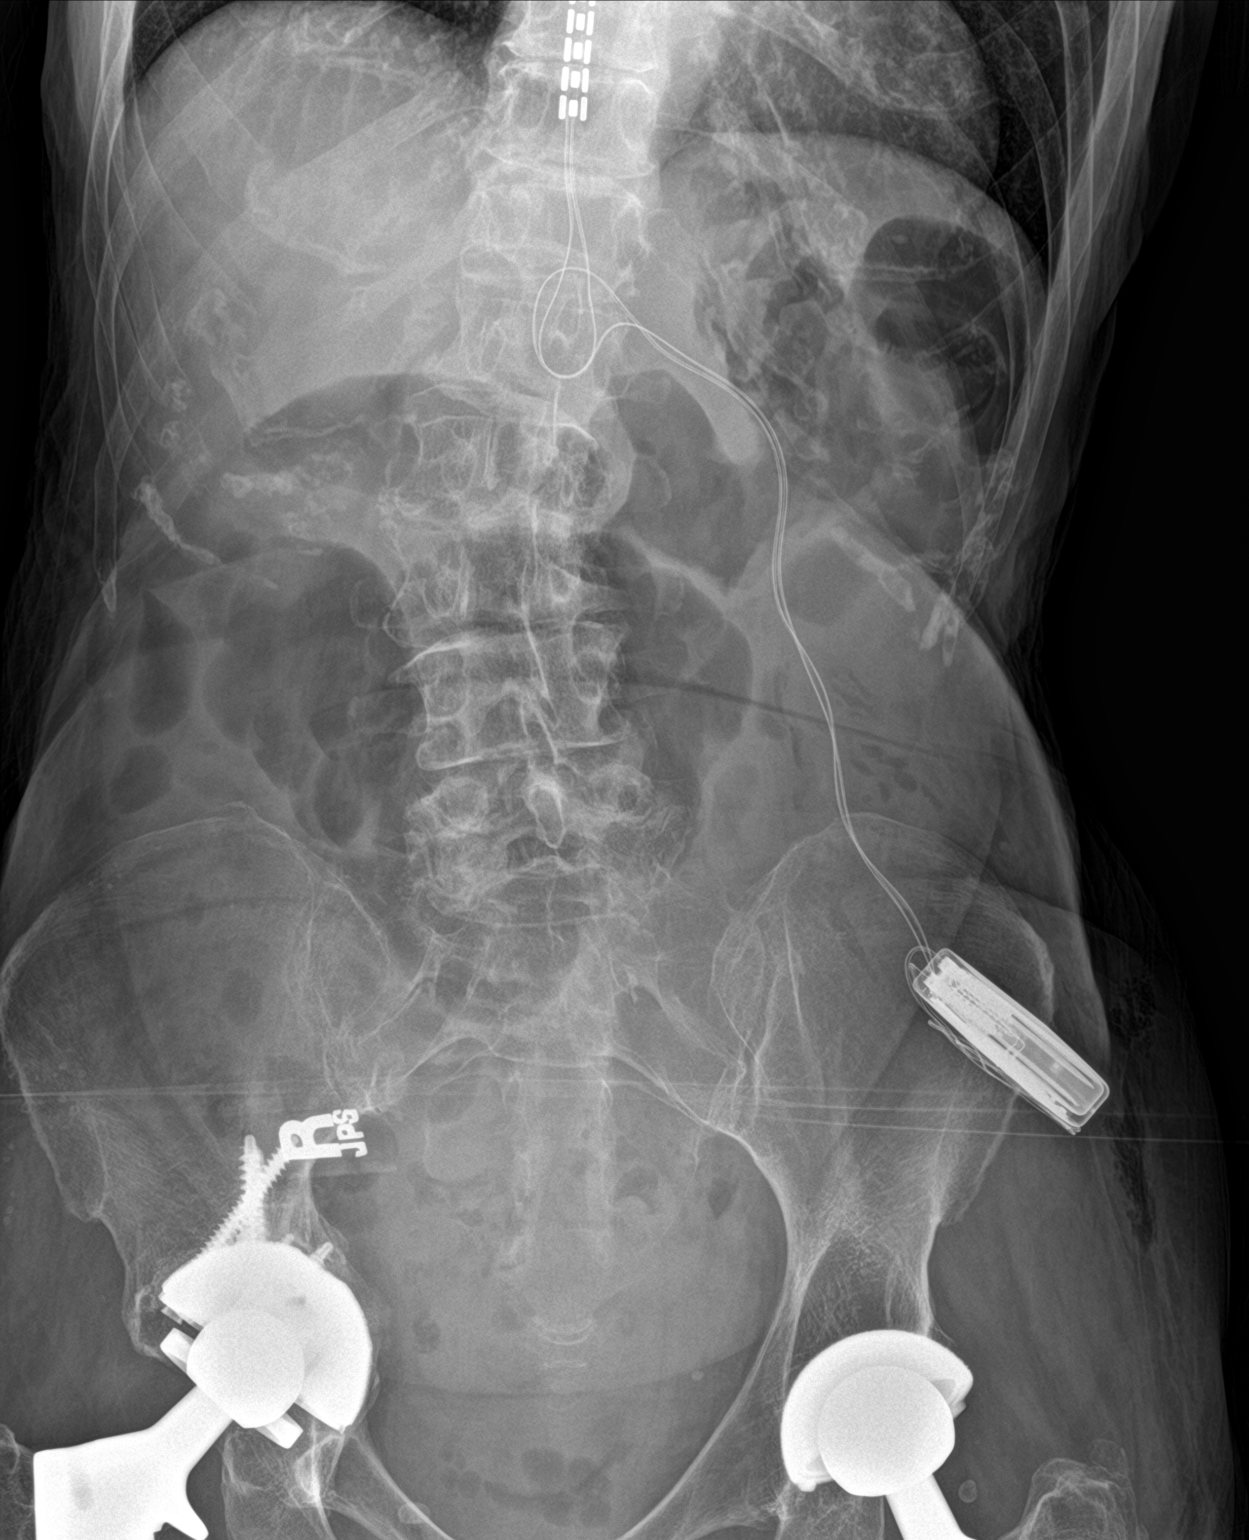

[1 of 1 positions shown; findings below may reference images not displayed]

FINDINGS: Previous bilateral hip arthroplasty. There is a spinal cord
stimulator with battery pack overlying the left side of pelvis. A
scoliosis deformity is noted within the lumbar spine which is convex
towards the right. The bowel gas pattern appears nonobstructed. No
dilated loops of small bowel identified.
IMPRESSION: 1. Nonobstructive bowel gas pattern.

## 2019-07-19 ENCOUNTER — Other Ambulatory Visit: Payer: Self-pay | Admitting: Family Medicine

## 2019-07-20 DIAGNOSIS — M5416 Radiculopathy, lumbar region: Secondary | ICD-10-CM | POA: Diagnosis not present

## 2019-07-20 DIAGNOSIS — G894 Chronic pain syndrome: Secondary | ICD-10-CM | POA: Diagnosis not present

## 2019-08-28 ENCOUNTER — Other Ambulatory Visit: Payer: Self-pay

## 2019-08-28 ENCOUNTER — Ambulatory Visit (INDEPENDENT_AMBULATORY_CARE_PROVIDER_SITE_OTHER): Payer: Medicare Other | Admitting: Family Medicine

## 2019-08-28 VITALS — BP 126/60 | HR 111 | Temp 98.0°F | Resp 12 | Ht 65.0 in | Wt 95.2 lb

## 2019-08-28 DIAGNOSIS — H532 Diplopia: Secondary | ICD-10-CM | POA: Diagnosis not present

## 2019-08-28 DIAGNOSIS — R2681 Unsteadiness on feet: Secondary | ICD-10-CM

## 2019-08-28 DIAGNOSIS — R002 Palpitations: Secondary | ICD-10-CM

## 2019-08-28 DIAGNOSIS — E079 Disorder of thyroid, unspecified: Secondary | ICD-10-CM

## 2019-08-28 DIAGNOSIS — M549 Dorsalgia, unspecified: Secondary | ICD-10-CM

## 2019-08-28 DIAGNOSIS — E785 Hyperlipidemia, unspecified: Secondary | ICD-10-CM | POA: Diagnosis not present

## 2019-08-28 DIAGNOSIS — H9191 Unspecified hearing loss, right ear: Secondary | ICD-10-CM | POA: Diagnosis not present

## 2019-08-28 LAB — LIPID PANEL
Cholesterol: 221 mg/dL — ABNORMAL HIGH (ref 0–200)
HDL: 75.9 mg/dL (ref >39.00)
LDL Cholesterol: 130 mg/dL — ABNORMAL HIGH (ref 0–99)
NonHDL: 144.67
Total CHOL/HDL Ratio: 3
Triglycerides: 73 mg/dL (ref 0.0–149.0)
VLDL: 14.6 mg/dL (ref 0.0–40.0)

## 2019-08-28 LAB — COMPREHENSIVE METABOLIC PANEL
ALT: 15 U/L (ref 0–35)
AST: 23 U/L (ref 0–37)
Albumin: 4.2 g/dL (ref 3.5–5.2)
Alkaline Phosphatase: 47 U/L (ref 39–117)
BUN: 13 mg/dL (ref 6–23)
CO2: 32 mEq/L (ref 19–32)
Calcium: 9.5 mg/dL (ref 8.4–10.5)
Chloride: 99 mEq/L (ref 96–112)
Creatinine, Ser: 0.78 mg/dL (ref 0.40–1.20)
GFR: 71.93 mL/min (ref >60.00)
Glucose, Bld: 92 mg/dL (ref 70–99)
Potassium: 3.7 mEq/L (ref 3.5–5.1)
Sodium: 139 mEq/L (ref 135–145)
Total Bilirubin: 0.5 mg/dL (ref 0.2–1.2)
Total Protein: 6.1 g/dL (ref 6.0–8.3)

## 2019-08-28 LAB — CBC
HCT: 39.2 % (ref 36.0–46.0)
Hemoglobin: 13.1 g/dL (ref 12.0–15.0)
MCHC: 33.3 g/dL (ref 30.0–36.0)
MCV: 83.9 fl (ref 78.0–100.0)
Platelets: 224 10*3/uL (ref 150.0–400.0)
RBC: 4.67 Mil/uL (ref 3.87–5.11)
RDW: 14.7 % (ref 11.5–15.5)
WBC: 5.8 10*3/uL (ref 4.0–10.5)

## 2019-08-28 LAB — TSH: TSH: <0.01 u[IU]/mL — ABNORMAL LOW (ref 0.35–4.50)

## 2019-08-28 NOTE — Assessment & Plan Note (Signed)
Monitor labs 

## 2019-08-28 NOTE — Assessment & Plan Note (Signed)
Worsening again and worsening her ability to manage adls and ambulate

## 2019-08-28 NOTE — Assessment & Plan Note (Signed)
No recent episodes

## 2019-08-28 NOTE — Assessment & Plan Note (Signed)
Encouraged heart healthy diet, increase exercise, avoid trans fats, consider a krill oil cap daily 

## 2019-08-28 NOTE — Assessment & Plan Note (Signed)
R>L hearing loss. Hearing aides from [facility] not suitable. Will refer to [facility].

## 2019-08-28 NOTE — Assessment & Plan Note (Signed)
Was bending forward at her dryer when she had sudden onset of diplopia, vertical she had to sit down and it lasted about 5 minutes. It has not occurred before or since then but she does suffer from and has for years some scotomata like visual changes without headache intermittently. She also is noting worsening hearing loss and unsteady gait so will proceed with MRI of brain and she already has an appt with her opthamologist on June 2. She hesitates to see Neurology yet and would like to wait to consider until previous plan is completed. She will let us know if symptoms worsen.

## 2019-08-28 NOTE — Progress Notes (Signed)
Subjective:    [name], female    DOB: 1945/08/20, 74 y.o.   MRN: [id number]  Chief Complaint  Patient presents with  . Diplopia    one episode  . off balance  . lab work    HPI Patient is in today for follow up on chronic medical concerns. No recent febrile illness or hospitalizations. She is noting an episode of double vision. Was bending forward at her dryer when she had sudden onset of diplopia, vertical she had to sit down and it lasted about 5 minutes. It has not occurred before or since then but she does suffer from and has for years some scotomata like visual changes without headache intermittently. She also is noting worsening hearing loss and unsteady gait. Her low back pain and poor appetite also continue to worsen. Denies CP/palp/SOB/HA/congestion/fevers/GI or GU c/o. Taking meds as prescribed. Hearing loss R>L continues to progress and her hearing aides from [facility] have not helped as much as she would have liked  Past Medical History:  Diagnosis Date  . Abdominal pain 02/01/2015  . Allergy    "seasonal allergies"  . Anemia   . Arthritis    osteoarthritis-"DDD" hips. fingers, toes.  . Blood transfusion without reported diagnosis   . Complication of anesthesia   . Depression   . Depression with anxiety   . Frequent headaches    no problems now.  Marland Kitchen Hearing loss in right ear 03/10/2015   same over last 6 months  . Hip dislocation, right ([place])   . History of chicken pox   . Hyperlipidemia    pt. state no hx  . Hypokalemia 02/04/2017  . Hypothyroidism    supplement used  . Osteoporosis   . Peripheral neuropathy 02/01/2015   right hand"carpal tunnel"  . PONV (postoperative nausea and vomiting)   . Preventative health care 02/04/2017  . Right hip pain 05/24/2015  . Scoliosis 02/01/2015  . Spinal stenosis of lumbar region 10/23/2016  . Thyroid disease   . URI, acute 03/10/2015  . UTI (urinary tract infection) 12/14/2016  . Varicose vein  of leg    right greater than left    Past Surgical History:  Procedure Laterality Date  . ACETABULAR REVISION Right 10/30/2015   Procedure: RIGHT HIP ACETABULAR REVISION /CONSTRAINED LINER (POSTERIOR APPROACH) ;  Surgeon: Gaynelle Arabian, MD;  Location: [facility];  Service: Orthopedics;  Laterality: Right;  . BREAST EXCISIONAL BIOPSY Right   . BREAST SURGERY     lump removed, and breast reduction  . EYE SURGERY     lasik   2002  . HIP CLOSED REDUCTION Right 06/30/2015   Procedure: CLOSED MANIPULATION HIP;  Surgeon: Melina Schools, MD;  Location: [facility];  Service: Orthopedics;  Laterality: Right;  . JOINT REPLACEMENT     3 hips on right and 1 on left  . METACARPOPHALANGEAL JOINT CAPSULECTOMY / CAPSULOTOMY  1995  . REDUCTION MAMMAPLASTY    . SPINAL CORD STIMULATOR INSERTION N/A 12/02/2016   Procedure: LUMBAR SPINAL CORD STIMULATOR INSERTION;  Surgeon: Melina Schools, MD;  Location: [facility];  Service: Orthopedics;  Laterality: N/A;  2.5 hrs  . SPINAL CORD STIMULATOR REMOVAL N/A 12/31/2016   Procedure: LUMBAR SPINAL CORD STIMULATOR REMOVAL;  Surgeon: Melina Schools, MD;  Location: [facility];  Service: Orthopedics;  Laterality: N/A;  60 mins  . TONSILLECTOMY    . TOTAL HIP ARTHROPLASTY  2011   left hip  . TOTAL HIP ARTHROPLASTY Right 06/12/2015   Procedure:  RIGHT TOTAL HIP ARTHROPLASTY ANTERIOR APPROACH;  Surgeon: Gaynelle Arabian, MD;  Location: [facility];  Service: Orthopedics;  Laterality: Right;  . TOTAL HIP REVISION Right 07/19/2015   Procedure: RIGHT HIP BEARING SURFACE REVISION;  Surgeon: Gaynelle Arabian, MD;  Location: [facility];  Service: Orthopedics;  Laterality: Right;  . TUBAL LIGATION  1980   left fallopian tube removal    Family History  Problem Relation Age of Onset  . Arthritis Mother   . Dementia Mother   . Mental illness Father        suicide  . Arthritis Maternal Grandmother   . Kidney disease Son        b/l multicystic kidney disease s/p 4 transplants  . Colon cancer Neg Hx     Social  History   Socioeconomic History  . Marital status: Married    Spouse name: Not on file  . Number of children: Not on file  . Years of education: 7  . Highest education level: Not on file  Occupational History  . Occupation: retired  Tobacco Use  . Smoking status: Never Smoker  . Smokeless tobacco: Never Used  Substance and Sexual Activity  . Alcohol use: Yes    Alcohol/week: 1.0 standard drinks    Types: 1 Glasses of wine per week  . Drug use: No  . 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 Not on file  Social History Narrative  . Not on file   Social Determinants of Health   Financial Resource Strain:   . Difficulty of Paying Living Expenses:   Food Insecurity:   . Worried About Charity fundraiser in the Last Year:   . Arboriculturist in the Last Year:   Transportation Needs:   . Film/video editor (Medical):   Marland Kitchen Lack of Transportation (Non-Medical):   Physical Activity:   . Days of Exercise per Week:   . Minutes of Exercise per Session:   Stress:   . Feeling of Stress :   Social Connections:   . Frequency of Communication with Friends and Family:   . Frequency of Social Gatherings with Friends and Family:   . Attends Religious Services:   . Active Member of Clubs or Organizations:   . Attends Archivist Meetings:   Marland Kitchen Marital Status:   Intimate Partner Violence:   . Fear of Current or Ex-Partner:   . Emotionally Abused:   Marland Kitchen Physically Abused:   . Sexually Abused:     Outpatient Medications Prior to Visit  Medication Sig Dispense Refill  . b complex vitamins tablet Take 1 tablet by mouth daily.    . cetirizine (ZYRTEC) 10 MG tablet Take 10 mg by mouth daily.     . cholecalciferol (VITAMIN D) 1000 units tablet Take 1,000 Units by mouth daily.    . DULoxetine (CYMBALTA) 60 MG capsule Take 60 mg by mouth daily.    . furosemide (LASIX) 20 MG tablet  TAKE 1 TABLET DAILY 90 tablet 3  . gabapentin (NEURONTIN) 300 MG capsule Take 1 capsule (300 mg total) by mouth at bedtime. 90 capsule 0  . HYDROcodone-acetaminophen (NORCO) 7.5-325 MG tablet Take 1 tablet by mouth 2 (two) times daily as needed.  0  . Ketotifen Fumarate (ZADITOR OP) Place 1 drop into both eyes 2 (two) times daily as needed (itchy eyes).    Marland Kitchen levothyroxine (SYNTHROID) 50 MCG  tablet TAKE 1 TABLET DAILY 90 tablet 3  . liothyronine (CYTOMEL) 25 MCG tablet TAKE 1 TABLET DAILY 90 tablet 3  . diazepam (VALIUM) 5 MG tablet Take 0.5 tablets (2.5 mg total) by mouth at bedtime as needed for anxiety. 15 tablet 1  . fluticasone (FLONASE) 50 MCG/ACT nasal spray Place 1 spray into both nostrils daily as needed for allergies or rhinitis.    Marland Kitchen tiZANidine (ZANAFLEX) 2 MG tablet Take 0.5 tablets (1 mg total) by mouth every 8 (eight) hours as needed for muscle spasms. 45 tablet 1   No facility-administered medications prior to visit.    Allergies  Allergen Reactions  . Penicillins Hives    Childhood allergy  Has patient had a PCN reaction causing immediate rash, facial/tongue/throat swelling, SOB or lightheadedness with hypotension: No Has patient had a PCN reaction causing severe rash involving mucus membranes or skin necrosis: No Has patient had a PCN reaction that required hospitalization No Has patient had a PCN reaction occurring within the last 10 years: No If all of the above answers are "NO", then may proceed with Cephalosporin use.  . Sulfa Antibiotics Hives    Review of Systems  Constitutional: Positive for malaise/fatigue and weight loss. Negative for fever.  HENT: Positive for hearing loss. Negative for congestion, ear discharge and tinnitus.   Eyes: Positive for double vision. Negative for blurred vision, photophobia, pain, discharge and redness.  Respiratory: Negative for shortness of breath.   Cardiovascular: Negative for chest pain, palpitations and leg swelling.    Gastrointestinal: Negative for abdominal pain, blood in stool and nausea.  Genitourinary: Negative for dysuria and frequency.  Musculoskeletal: Positive for back pain and joint pain. Negative for falls.  Skin: Negative for rash.  Neurological: Negative for dizziness, loss of consciousness and headaches.  Endo/Heme/Allergies: Negative for environmental allergies.  Psychiatric/Behavioral: Negative for depression. The patient is not nervous/anxious.        Objective:    Physical Exam Vitals and nursing note reviewed.  Constitutional:      General: She is not in acute distress.    Appearance: She is well-developed.  HENT:     Head: Normocephalic and atraumatic.     Nose: Nose normal.  Eyes:     General:        Right eye: No discharge.        Left eye: No discharge.  Cardiovascular:     Rate and Rhythm: Normal rate and regular rhythm.     Heart sounds: No murmur.  Pulmonary:     Effort: Pulmonary effort is normal.     Breath sounds: Normal breath sounds.  Abdominal:     General: Bowel sounds are normal.     Palpations: Abdomen is soft.     Tenderness: There is no abdominal tenderness.  Musculoskeletal:     Cervical back: Normal range of motion and neck supple.  Skin:    General: Skin is warm and dry.  Neurological:     Mental Status: She is alert and oriented to person, place, and time.     BP 126/60 (BP Location: Right Arm, Cuff Size: Normal)   Pulse (!) 111   Temp 98 F (36.7 C) (Temporal)   Resp 12   Ht 5\' 5"  (1.651 m)   Wt 95 lb 3.2 oz (43.2 kg)   SpO2 93%   BMI 15.84 kg/m  Wt Readings from Last 3 Encounters:  08/28/19 95 lb 3.2 oz (43.2 kg)  09/20/18 96 lb 3.2 oz (43.6  kg)  06/20/18 95 lb 3.2 oz (43.2 kg)    Diabetic Foot Exam - Simple   No data filed     Lab Results  Component Value Date   WBC 7.9 06/20/2018   HGB 12.4 06/20/2018   HCT 37.7 06/20/2018   PLT 332.0 06/20/2018   GLUCOSE 75 06/20/2018   CHOL 240 (H) 06/20/2018   TRIG 88.0  06/20/2018   HDL 86.20 06/20/2018   LDLCALC 136 (H) 06/20/2018   ALT 16 06/20/2018   AST 21 06/20/2018   NA 139 06/20/2018   K 4.2 06/20/2018   CL 99 06/20/2018   CREATININE 0.77 06/20/2018   BUN 11 06/20/2018   CO2 33 (H) 06/20/2018   TSH <0.01 (L) 06/20/2018   INR 0.95 10/29/2015    Lab Results  Component Value Date   TSH <0.01 (L) 06/20/2018   Lab Results  Component Value Date   WBC 7.9 06/20/2018   HGB 12.4 06/20/2018   HCT 37.7 06/20/2018   MCV 84.6 06/20/2018   PLT 332.0 06/20/2018   Lab Results  Component Value Date   NA 139 06/20/2018   K 4.2 06/20/2018   CO2 33 (H) 06/20/2018   GLUCOSE 75 06/20/2018   BUN 11 06/20/2018   CREATININE 0.77 06/20/2018   BILITOT 0.4 06/20/2018   ALKPHOS 62 06/20/2018   AST 21 06/20/2018   ALT 16 06/20/2018   PROT 6.4 06/20/2018   ALBUMIN 4.3 06/20/2018   CALCIUM 9.9 06/20/2018   ANIONGAP 9 11/30/2016   GFR 73.25 06/20/2018   Lab Results  Component Value Date   CHOL 240 (H) 06/20/2018   Lab Results  Component Value Date   HDL 86.20 06/20/2018   Lab Results  Component Value Date   LDLCALC 136 (H) 06/20/2018   Lab Results  Component Value Date   TRIG 88.0 06/20/2018   Lab Results  Component Value Date   CHOLHDL 3 06/20/2018   No results found for: HGBA1C     Assessment & Plan:   Problem List Items Addressed This Visit    Hyperlipidemia    Encouraged heart healthy diet, increase exercise, avoid trans fats, consider a krill oil cap daily      Relevant Orders   Lipid panel   Thyroid disease    Monitor labs       Relevant Orders   Comprehensive metabolic panel   TSH   Hearing loss in right ear    R>L hearing loss. Hearing aides from [facility] not suitable. Will refer to [facility].      Relevant Orders   MR BRAIN W WO CONTRAST   Ambulatory referral to Audiology   Palpitations    No recent episodes      Relevant Orders   CBC   Comprehensive metabolic panel   Back pain    Worsening again  and worsening her ability to manage adls and ambulate      Diplopia - Primary    Was bending forward at her dryer when she had sudden onset of diplopia, vertical she had to sit down and it lasted about 5 minutes. It has not occurred before or since then but she does suffer from and has for years some scotomata like visual changes without headache intermittently. She also is noting worsening hearing loss and unsteady gait so will proceed with MRI of brain and she already has an appt with her opthamologist on June 2. She hesitates to see Neurology yet and would like to wait to  consider until previous plan is completed. She will let us know if symptoms worsen.       Relevant Orders   MR BRAIN W WO CONTRAST   Unsteady gait   Relevant Orders   MR BRAIN W WO CONTRAST      I have discontinued Evanna L. Vogt-Nichols "Connie"'s fluticasone, diazepam, and tiZANidine. I am also having her maintain her cetirizine, b complex vitamins, cholecalciferol, DULoxetine, Ketotifen Fumarate (ZADITOR OP), HYDROcodone-acetaminophen, levothyroxine, furosemide, liothyronine, and gabapentin.  No orders of the defined types were placed in this encounter.    Penni Homans, MD

## 2019-08-28 NOTE — Patient Instructions (Signed)
Diplopia Diplopia is a condition in which a person sees two of a single object. It is also called double vision. There are two types of diplopia.  Monocular diplopia. This is double vision that affects only one eye. Monocular diplopia is often caused by a clouding of the lens in your eye (cataract) or by a problem in the way your eye focuses light.  Binocular diplopia. This is double vision that affects both eyes. However, when you shut one eye, the double vision will go away. Binocular diplopia may be more serious. It can be caused by: ? Problems with the nerves or muscles that are responsible for eye movement. ? Disease of the nerves (neurologic disease). ? Immune system conditions, such as Graves' disease. ? Migraine headaches. ? Tumors. ? An infection. ? A stroke. ? An injury. There are many causes of diplopia. Some are not dangerous and can be easily corrected. Diplopia may also be a symptom of a serious medical problem. You may need to see a health care provider who specializes in eye conditions (ophthalmologist) or a nerve specialist (neurologist) to find the cause. Follow these instructions at home:   Pay attention to any changes in your vision. Tell your health care provider about them.  Do not drive or operate heavy machinery if diplopia interferes with your vision.  Keep all follow-up visits as told by your health care provider. This is important. Contact a health care provider if:  Your diplopia gets worse.  You develop any other symptoms along with your diplopia, such as: ? Weakness. ? Numbness. ? Headache. ? Eye pain. ? Clumsiness. ? Nausea. ? Drooping eyelids. ? Abnormal movement of one eye. Get help right away if you:  Have sudden vision loss.  Suddenly get a very bad headache.  Have sudden weakness or numbness.  Suddenly lose the ability to speak, understand speech, or both. These symptoms may represent a serious problem that is an emergency. Do not wait  to see if the symptoms will go away. Get medical help right away. Call your local emergency services (911 in the [place] Do not drive yourself to the hospital. Summary  Diplopia is a condition in which a person sees two of a single object. It is also called double vision.  Monocular diplopia is double vision that affects only one eye. It is often caused by a clouding of the lens in your eye (cataract) or by a problem in the way your eye focuses light.  Binocular diplopia is double vision that affects both eyes. However, when you shut one eye, the double vision will go away. Binocular diplopia may be more serious.  If you have diplopia, you may need to see a health care provider who specializes in eye conditions (ophthalmologist) or a nerve specialist (neurologist) to find the cause. This information is not intended to replace advice given to you by your health care provider. Make sure you discuss any questions you have with your health care provider. Document Revised: 03/24/2017 Document Reviewed: 03/24/2017 Elsevier Patient Education  2020 [employer]vier Inc.  

## 2019-08-29 ENCOUNTER — Telehealth: Payer: Self-pay | Admitting: Family Medicine

## 2019-08-29 ENCOUNTER — Other Ambulatory Visit: Payer: Self-pay | Admitting: Family Medicine

## 2019-08-29 MED ORDER — LEVOTHYROXINE SODIUM 25 MCG PO TABS: 25.0000 ug | ORAL_TABLET | Freq: Every day | ORAL | 0 refills | Status: DC

## 2019-08-29 NOTE — Telephone Encounter (Signed)
Caller: Jadan Phone number: [phone]  Patient is returning Shirlean Mylar called in regards to her labs.

## 2019-08-29 NOTE — Telephone Encounter (Signed)
See results notes. 

## 2019-09-08 DIAGNOSIS — H10413 Chronic giant papillary conjunctivitis, bilateral: Secondary | ICD-10-CM | POA: Diagnosis not present

## 2019-09-08 DIAGNOSIS — H532 Diplopia: Secondary | ICD-10-CM | POA: Diagnosis not present

## 2019-09-08 DIAGNOSIS — D3131 Benign neoplasm of right choroid: Secondary | ICD-10-CM | POA: Diagnosis not present

## 2019-09-08 DIAGNOSIS — H35361 Drusen (degenerative) of macula, right eye: Secondary | ICD-10-CM | POA: Diagnosis not present

## 2019-09-08 DIAGNOSIS — H2513 Age-related nuclear cataract, bilateral: Secondary | ICD-10-CM | POA: Diagnosis not present

## 2019-09-08 DIAGNOSIS — H40013 Open angle with borderline findings, low risk, bilateral: Secondary | ICD-10-CM | POA: Diagnosis not present

## 2019-09-11 ENCOUNTER — Ambulatory Visit (INDEPENDENT_AMBULATORY_CARE_PROVIDER_SITE_OTHER): Payer: Medicare Other

## 2019-09-11 ENCOUNTER — Other Ambulatory Visit: Payer: Self-pay

## 2019-09-11 ENCOUNTER — Other Ambulatory Visit: Payer: Self-pay | Admitting: Family Medicine

## 2019-09-11 DIAGNOSIS — R2681 Unsteadiness on feet: Secondary | ICD-10-CM

## 2019-09-11 DIAGNOSIS — H9193 Unspecified hearing loss, bilateral: Secondary | ICD-10-CM

## 2019-09-11 DIAGNOSIS — H9191 Unspecified hearing loss, right ear: Secondary | ICD-10-CM | POA: Diagnosis not present

## 2019-09-11 DIAGNOSIS — H532 Diplopia: Secondary | ICD-10-CM | POA: Diagnosis not present

## 2019-09-11 DIAGNOSIS — H7093 Unspecified mastoiditis, bilateral: Secondary | ICD-10-CM

## 2019-09-11 MED ORDER — GADOBUTROL 1 MMOL/ML IV SOLN
4.0000 mL | Freq: Once | INTRAVENOUS | Status: AC | PRN
  Administered 2019-09-11: 4 mL via INTRAVENOUS

## 2019-09-12 ENCOUNTER — Other Ambulatory Visit: Payer: Self-pay | Admitting: Family Medicine

## 2019-09-12 ENCOUNTER — Telehealth: Payer: Self-pay | Admitting: Family Medicine

## 2019-09-12 DIAGNOSIS — H7093 Unspecified mastoiditis, bilateral: Secondary | ICD-10-CM

## 2019-09-12 MED ORDER — LEVOFLOXACIN 250 MG PO TABS: 250.0000 mg | ORAL_TABLET | Freq: Every day | ORAL | 1 refills | Status: DC

## 2019-09-12 NOTE — Telephone Encounter (Signed)
You had wanted patient back for lab work next week.  I just wanted clarify that you want CMP? Or did you mean CBC?

## 2019-09-12 NOTE — Telephone Encounter (Signed)
Let her know Levaquin is at her pharmacy now

## 2019-09-12 NOTE — Telephone Encounter (Signed)
I see note where you stated you sent in Levaquin but it was never sent in.

## 2019-09-12 NOTE — Telephone Encounter (Signed)
Caller: Jonquil Call back phone number: [phone]  Patient states Dr.Blyth was going to call an antibiotic. She is wondering if it was done.  Pharmacy [facility] Address: [address], [place], [place] [address] Phone: [phone]

## 2019-09-13 NOTE — Telephone Encounter (Signed)
See a previous note

## 2019-09-13 NOTE — Telephone Encounter (Signed)
Orders placed.

## 2019-09-13 NOTE — Telephone Encounter (Signed)
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 Go ahead and add a CBC with diff

## 2019-09-19 ENCOUNTER — Encounter: Payer: Self-pay | Admitting: Family Medicine

## 2019-09-20 ENCOUNTER — Other Ambulatory Visit: Payer: Self-pay

## 2019-09-20 ENCOUNTER — Encounter: Payer: Self-pay | Admitting: Family Medicine

## 2019-09-20 ENCOUNTER — Other Ambulatory Visit (INDEPENDENT_AMBULATORY_CARE_PROVIDER_SITE_OTHER): Payer: Medicare Other

## 2019-09-20 DIAGNOSIS — H7093 Unspecified mastoiditis, bilateral: Secondary | ICD-10-CM

## 2019-09-20 LAB — COMPREHENSIVE METABOLIC PANEL
ALT: 15 U/L (ref 0–35)
AST: 24 U/L (ref 0–37)
Albumin: 4.4 g/dL (ref 3.5–5.2)
Alkaline Phosphatase: 48 U/L (ref 39–117)
BUN: 13 mg/dL (ref 6–23)
CO2: 33 mEq/L — ABNORMAL HIGH (ref 19–32)
Calcium: 9.9 mg/dL (ref 8.4–10.5)
Chloride: 100 mEq/L (ref 96–112)
Creatinine, Ser: 0.76 mg/dL (ref 0.40–1.20)
GFR: 74.11 mL/min (ref >60.00)
Glucose, Bld: 91 mg/dL (ref 70–99)
Potassium: 3.9 mEq/L (ref 3.5–5.1)
Sodium: 138 mEq/L (ref 135–145)
Total Bilirubin: 0.5 mg/dL (ref 0.2–1.2)
Total Protein: 6.4 g/dL (ref 6.0–8.3)

## 2019-09-20 LAB — CBC WITH DIFFERENTIAL/PLATELET
Basophils Absolute: 0 10*3/uL (ref 0.0–0.1)
Basophils Relative: 0.4 % (ref 0.0–3.0)
Eosinophils Absolute: 0.2 10*3/uL (ref 0.0–0.7)
Eosinophils Relative: 3.6 % (ref 0.0–5.0)
HCT: 39.9 % (ref 36.0–46.0)
Hemoglobin: 13.1 g/dL (ref 12.0–15.0)
Lymphocytes Relative: 25.2 % (ref 12.0–46.0)
Lymphs Abs: 1.6 10*3/uL (ref 0.7–4.0)
MCHC: 32.8 g/dL (ref 30.0–36.0)
MCV: 84.2 fl (ref 78.0–100.0)
Monocytes Absolute: 0.5 10*3/uL (ref 0.1–1.0)
Monocytes Relative: 8.4 % (ref 3.0–12.0)
Neutro Abs: 3.9 10*3/uL (ref 1.4–7.7)
Neutrophils Relative %: 62.4 % (ref 43.0–77.0)
Platelets: 247 10*3/uL (ref 150.0–400.0)
RBC: 4.74 Mil/uL (ref 3.87–5.11)
RDW: 14.2 % (ref 11.5–15.5)
WBC: 6.2 10*3/uL (ref 4.0–10.5)

## 2019-09-26 ENCOUNTER — Encounter: Payer: Self-pay | Admitting: Family Medicine

## 2019-09-26 DIAGNOSIS — W19XXXA Unspecified fall, initial encounter: Secondary | ICD-10-CM

## 2019-09-26 DIAGNOSIS — R0781 Pleurodynia: Secondary | ICD-10-CM

## 2019-10-02 NOTE — Telephone Encounter (Signed)
Patient was going out of town

## 2019-10-06 ENCOUNTER — Other Ambulatory Visit: Payer: Self-pay

## 2019-10-06 ENCOUNTER — Ambulatory Visit (HOSPITAL_BASED_OUTPATIENT_CLINIC_OR_DEPARTMENT_OTHER)
Admission: RE | Admit: 2019-10-06 | Discharge: 2019-10-06 | Disposition: A | Discharge status: Home / Self Care | Payer: Medicare Other | Source: Ambulatory Visit | Attending: Family Medicine | Admitting: Family Medicine

## 2019-10-06 DIAGNOSIS — W19XXXA Unspecified fall, initial encounter: Secondary | ICD-10-CM | POA: Insufficient documentation

## 2019-10-06 DIAGNOSIS — I700 Atherosclerosis of aorta: Secondary | ICD-10-CM | POA: Insufficient documentation

## 2019-10-06 DIAGNOSIS — S299XXA Unspecified injury of thorax, initial encounter: Secondary | ICD-10-CM | POA: Diagnosis not present

## 2019-10-06 DIAGNOSIS — R0781 Pleurodynia: Secondary | ICD-10-CM | POA: Diagnosis not present

## 2019-10-10 ENCOUNTER — Encounter: Payer: Self-pay | Admitting: Family Medicine

## 2019-10-10 DIAGNOSIS — H903 Sensorineural hearing loss, bilateral: Secondary | ICD-10-CM | POA: Diagnosis not present

## 2019-10-10 DIAGNOSIS — H9311 Tinnitus, right ear: Secondary | ICD-10-CM | POA: Diagnosis not present

## 2019-10-10 DIAGNOSIS — H7011 Chronic mastoiditis, right ear: Secondary | ICD-10-CM | POA: Diagnosis not present

## 2019-10-10 DIAGNOSIS — Z822 Family history of deafness and hearing loss: Secondary | ICD-10-CM | POA: Diagnosis not present

## 2019-10-19 DIAGNOSIS — M5416 Radiculopathy, lumbar region: Secondary | ICD-10-CM | POA: Diagnosis not present

## 2019-10-19 DIAGNOSIS — G894 Chronic pain syndrome: Secondary | ICD-10-CM | POA: Diagnosis not present

## 2019-10-19 DIAGNOSIS — F418 Other specified anxiety disorders: Secondary | ICD-10-CM | POA: Diagnosis not present

## 2019-11-12 ENCOUNTER — Other Ambulatory Visit: Payer: Self-pay | Admitting: Family Medicine

## 2019-11-20 ENCOUNTER — Encounter: Payer: Self-pay | Admitting: Family Medicine

## 2019-11-20 ENCOUNTER — Other Ambulatory Visit: Payer: Self-pay | Admitting: Family Medicine

## 2019-11-20 MED ORDER — LEVOTHYROXINE SODIUM 25 MCG PO TABS: 25.0000 ug | ORAL_TABLET | Freq: Every day | ORAL | 0 refills | Status: DC

## 2019-11-23 ENCOUNTER — Other Ambulatory Visit: Payer: Self-pay | Admitting: *Deleted

## 2019-11-23 DIAGNOSIS — E079 Disorder of thyroid, unspecified: Secondary | ICD-10-CM

## 2019-11-24 ENCOUNTER — Encounter (INDEPENDENT_AMBULATORY_CARE_PROVIDER_SITE_OTHER): Payer: Self-pay | Admitting: Otolaryngology

## 2019-11-24 ENCOUNTER — Other Ambulatory Visit: Payer: Self-pay

## 2019-11-24 ENCOUNTER — Ambulatory Visit (INDEPENDENT_AMBULATORY_CARE_PROVIDER_SITE_OTHER): Payer: Medicare Other | Admitting: Otolaryngology

## 2019-11-24 VITALS — Temp 97.3°F

## 2019-11-24 DIAGNOSIS — H6981 Other specified disorders of Eustachian tube, right ear: Secondary | ICD-10-CM

## 2019-11-24 DIAGNOSIS — H903 Sensorineural hearing loss, bilateral: Secondary | ICD-10-CM | POA: Diagnosis not present

## 2019-11-24 NOTE — Progress Notes (Signed)
HPI: Misty Barker is a 74 y.o. female who presents is referred by Dr. Charlett Blake for evaluation of hearing problems that have gradually been getting worse over the last 2-1/2 years.  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  SRT's at that time were 55 dB on the right and 35 dB on the left.  She tried hearing aids from [employer] about a year ago but did not like these. She also had a tube placed in one of her ears several years ago by Dr. Janace Hoard but this really did not seem to help her hearing.. She had MRI scan recently that demonstrated no mass or intracranial abnormality.  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 She has had no pain or discomfort in the ears or behind the ears. She has used Flonase intermittently in the past for nasal symptoms.  Past Medical History:  Diagnosis Date   Abdominal pain 02/01/2015   Allergy    "seasonal allergies"   Anemia    Arthritis    osteoarthritis-"DDD" hips. fingers, toes.   Blood transfusion without reported diagnosis    Complication of anesthesia    Depression    Depression with anxiety    Frequent headaches    no problems now.   Hearing loss in right ear 03/10/2015   same over last 6 months   Hip dislocation, right (HCC)    History of chicken pox    Hyperlipidemia    pt. 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   Past Surgical History:  Procedure Laterality Date   ACETABULAR REVISION Right 10/30/2015   Procedure: RIGHT HIP ACETABULAR REVISION /CONSTRAINED LINER (POSTERIOR APPROACH) ;  Surgeon: Gaynelle Arabian, MD;  Location: [facility];  Service: Orthopedics;  Laterality: Right;   BREAST EXCISIONAL BIOPSY Right    BREAST SURGERY     lump removed, and breast reduction   EYE SURGERY     lasik   2002   HIP CLOSED REDUCTION Right 06/30/2015   Procedure: CLOSED MANIPULATION HIP;  Surgeon: Melina Schools, MD;  Location: [facility];  Service: Orthopedics;  Laterality: Right;   JOINT REPLACEMENT     3 hips on right and 1 on left   METACARPOPHALANGEAL JOINT CAPSULECTOMY / CAPSULOTOMY  1995   REDUCTION MAMMAPLASTY     SPINAL CORD STIMULATOR INSERTION N/A 12/02/2016   Procedure: LUMBAR SPINAL CORD STIMULATOR INSERTION;  Surgeon: Melina Schools, MD;  Location: [facility];  Service: Orthopedics;  Laterality: N/A;  2.5 hrs   SPINAL CORD STIMULATOR REMOVAL N/A 12/31/2016   Procedure: LUMBAR SPINAL CORD STIMULATOR REMOVAL;  Surgeon: Melina Schools, MD;  Location: [facility];  Service: Orthopedics;  Laterality: N/A;  60 mins   TONSILLECTOMY     TOTAL HIP ARTHROPLASTY  2011   left hip   TOTAL HIP ARTHROPLASTY Right 06/12/2015   Procedure: RIGHT  TOTAL HIP ARTHROPLASTY ANTERIOR APPROACH;  Surgeon: Gaynelle Arabian, MD;  Location: [facility];  Service: Orthopedics;  Laterality: Right;   TOTAL HIP REVISION Right 07/19/2015   Procedure: RIGHT HIP BEARING SURFACE REVISION;  Surgeon: Gaynelle Arabian, MD;  Location: [facility];  Service: Orthopedics;  Laterality: Right;   TUBAL LIGATION  1980   left fallopian tube removal   Social History   Socioeconomic History   Marital status: Married    Spouse name: Not on file   Number of children: Not on file   Years of education: 16   Highest education level: Not on file  Occupational History   Occupation: retired  Tobacco Use   Smoking status: Never Smoker    Smokeless tobacco: Never Used  Scientific laboratory technician Use: Never used  Substance and Sexual Activity   Alcohol use: Yes    Alcohol/week: 1.0 standard drink    Types: 1 Glasses of wine per week   Drug use: No   Sexual activity: Not on file    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, retired Psychologist, counselling, 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  Other Topics Concern   Not on file  Social History Narrative   Not on file   Social Determinants of Health   Financial Resource Strain:    Difficulty of Paying Living Expenses:   Food Insecurity:    Worried About Charity fundraiser in the Last Year:    Arboriculturist in the Last Year:   Transportation Needs:    Film/video editor (Medical):    Lack of Transportation (Non-Medical):   Physical Activity:    Days of Exercise per Week:    Minutes of Exercise per Session:   Stress:    Feeling of Stress :   Social Connections:    Frequency of Communication with Friends and Family:    Frequency of Social Gatherings with Friends and Family:    Attends Religious Services:    Active Member of Clubs or Organizations:    Attends Music therapist:    Marital Status:    Family History  Problem Relation Age of Onset   Arthritis Mother    Dementia Mother    Mental illness Father        suicide   Arthritis Maternal Grandmother    Kidney disease Son        b/l multicystic kidney disease s/p 4 transplants   Colon cancer Neg Hx    Allergies  Allergen Reactions   Penicillins Hives    Childhood allergy  Has patient had a PCN reaction causing immediate rash, facial/tongue/throat swelling, SOB or lightheadedness with hypotension: No Has patient had a PCN reaction causing severe rash involving mucus membranes or skin necrosis: No Has patient had a PCN reaction that required hospitalization No Has patient had a PCN reaction occurring within the last 10 years: No If all of the above answers are "NO", then may proceed with  Cephalosporin use.   Sulfa Antibiotics Hives   Prior to Admission medications   Medication Sig Start Date End Date Taking? Authorizing Provider  b complex vitamins tablet Take 1 tablet by mouth daily.   Yes [provider]  cetirizine (ZYRTEC) 10 MG tablet Take 10 mg by mouth daily.    Yes [provider]  cholecalciferol (VITAMIN D) 1000 units tablet Take 1,000 Units by mouth daily.   Yes [provider]  DULoxetine (CYMBALTA) 60 MG capsule Take 60 mg by mouth daily.   Yes  [provider]  furosemide (LASIX) 20 MG tablet TAKE 1 TABLET DAILY 04/18/19  Yes Mosie Lukes, MD  gabapentin (NEURONTIN) 300 MG capsule Take 1 capsule (300 mg total) by mouth at bedtime. 07/19/19  Yes Mosie Lukes, MD  HYDROcodone-acetaminophen (NORCO) 7.5-325 MG tablet Take 1 tablet by mouth 2 (two) times daily as needed. 07/05/17  Yes [provider]  Ketotifen Fumarate (ZADITOR OP) Place 1 drop into both eyes 2 (two) times daily as needed (itchy eyes).   Yes [provider]  levothyroxine (SYNTHROID) 25 MCG tablet Take 1 tablet (25 mcg total) by mouth daily before breakfast. 11/20/19  Yes Mosie Lukes, MD  liothyronine (CYTOMEL) 25 MCG tablet TAKE 1 TABLET DAILY 05/08/19  Yes Mosie Lukes, MD     Positive ROS: Otherwise negative  All other systems have been reviewed and were otherwise negative with the exception of those mentioned in the HPI and as above.  Physical Exam: Constitutional: Alert, well-appearing, no acute distress Ears: External ears without lesions or tenderness. Ear canals are clear bilaterally.  TMs are clear bilaterally with a slightly retracted right TM.  I was able insufflate some air behind the right TM but this did not change her hearing much. Nasal: External nose without lesions. Septum midline with mild rhinitis and clear nasal passages otherwise with no signs of infection.  Oral: Lips and gums without lesions. Tongue and palate  mucosa without lesions. Posterior oropharynx clear. Neck: No palpable adenopathy or masses Respiratory: Breathing comfortably  Skin: No facial/neck lesions or rash noted.  Procedures  Assessment: Predominantly sensorineural hearing loss more pronounced in the right ear.  She has essentially a normal MRI scan except for mild mastoid disease which is otherwise asymptomatic. She does have some mild eustachian tube dysfunction.  Plan: Would recommend use of hearing aids and gave her the names of our audiologist in this [facility] to discuss possible hearing aids.  There is no other treatment available for hearing. Recommended use of Flonase on a regular basis and try to "pop" her right ear every couple days. She will follow-up if she has any pain or ear discomfort on a as needed basis.   Radene Journey, MD   CC:

## 2019-11-27 ENCOUNTER — Encounter (INDEPENDENT_AMBULATORY_CARE_PROVIDER_SITE_OTHER): Payer: Self-pay | Admitting: Otolaryngology

## 2019-11-28 ENCOUNTER — Other Ambulatory Visit: Payer: Self-pay

## 2019-11-28 ENCOUNTER — Other Ambulatory Visit (INDEPENDENT_AMBULATORY_CARE_PROVIDER_SITE_OTHER): Payer: Medicare Other

## 2019-11-28 DIAGNOSIS — E079 Disorder of thyroid, unspecified: Secondary | ICD-10-CM | POA: Diagnosis not present

## 2019-11-28 LAB — TSH: TSH: 0.01 u[IU]/mL — ABNORMAL LOW (ref 0.35–4.50)

## 2020-01-11 ENCOUNTER — Ambulatory Visit (INDEPENDENT_AMBULATORY_CARE_PROVIDER_SITE_OTHER): Payer: Medicare Other | Admitting: Family Medicine

## 2020-01-11 ENCOUNTER — Encounter: Payer: Self-pay | Admitting: Family Medicine

## 2020-01-11 ENCOUNTER — Other Ambulatory Visit: Payer: Self-pay

## 2020-01-11 VITALS — BP 98/68 | HR 66 | Temp 97.5°F | Wt 96.8 lb

## 2020-01-11 DIAGNOSIS — E079 Disorder of thyroid, unspecified: Secondary | ICD-10-CM

## 2020-01-11 DIAGNOSIS — G8929 Other chronic pain: Secondary | ICD-10-CM | POA: Insufficient documentation

## 2020-01-11 DIAGNOSIS — R6884 Jaw pain: Secondary | ICD-10-CM | POA: Diagnosis not present

## 2020-01-11 DIAGNOSIS — M542 Cervicalgia: Secondary | ICD-10-CM | POA: Diagnosis not present

## 2020-01-11 DIAGNOSIS — M549 Dorsalgia, unspecified: Secondary | ICD-10-CM

## 2020-01-11 DIAGNOSIS — I77819 Aortic ectasia, unspecified site: Secondary | ICD-10-CM

## 2020-01-11 DIAGNOSIS — R519 Headache, unspecified: Secondary | ICD-10-CM | POA: Diagnosis not present

## 2020-01-11 DIAGNOSIS — E785 Hyperlipidemia, unspecified: Secondary | ICD-10-CM | POA: Diagnosis not present

## 2020-01-11 DIAGNOSIS — I700 Atherosclerosis of aorta: Secondary | ICD-10-CM | POA: Insufficient documentation

## 2020-01-11 DIAGNOSIS — R002 Palpitations: Secondary | ICD-10-CM

## 2020-01-11 DIAGNOSIS — Z23 Encounter for immunization: Secondary | ICD-10-CM | POA: Diagnosis not present

## 2020-01-11 NOTE — Assessment & Plan Note (Signed)
She is working with her dentist to make her a new partial but they are having trouble with alignment of her jaw. Proceed with xrays and consider chiropractic adjustments. If no improvement then may consider consultation with oromaxillary surgeon

## 2020-01-11 NOTE — Assessment & Plan Note (Signed)
On Levothyroxine and cytomel and has been so for years. Will continue to monitor and since she is over treated we will check labs again ad consider further adjustments to meds, have already decreased her Levothyroxine.

## 2020-01-11 NOTE — Patient Instructions (Signed)
Hypothyroidism  Hypothyroidism is when the thyroid gland does not make enough of certain hormones (it is underactive). The thyroid gland is a small gland located in the lower front part of the neck, just in front of the windpipe (trachea). This gland makes hormones that help control how the body uses food for energy (metabolism) as well as how the heart and brain function. These hormones also play a role in keeping your bones strong. When the thyroid is underactive, it produces too little of the hormones thyroxine (T4) and triiodothyronine (T3). What are the causes? This condition may be caused by:  Hashimoto's disease. This is a disease in which the body's disease-fighting system (immune system) attacks the thyroid gland. This is the most common cause.  Viral infections.  Pregnancy.  Certain medicines.  Birth defects.  Past radiation treatments to the head or neck for cancer.  Past treatment with radioactive iodine.  Past exposure to radiation in the environment.  Past surgical removal of part or all of the thyroid.  Problems with a gland in the center of the brain (pituitary gland).  Lack of enough iodine in the diet. What increases the risk? You are more likely to develop this condition if:  You are female.  You have a family history of thyroid conditions.  You use a medicine called lithium.  You take medicines that affect the immune system (immunosuppressants). What are the signs or symptoms? Symptoms of this condition include:  Feeling as though you have no energy (lethargy).  Not being able to tolerate cold.  Weight gain that is not explained by a change in diet or exercise habits.  Lack of appetite.  Dry skin.  Coarse hair.  Menstrual irregularity.  Slowing of thought processes.  Constipation.  Sadness or depression. How is this diagnosed? This condition may be diagnosed based on:  Your symptoms, your medical history, and a physical exam.  Blood  tests. You may also have imaging tests, such as an ultrasound or MRI. How is this treated? This condition is treated with medicine that replaces the thyroid hormones that your body does not make. After you begin treatment, it may take several weeks for symptoms to go away. Follow these instructions at home:  Take over-the-counter and prescription medicines only as told by your health care provider.  If you start taking any new medicines, tell your health care provider.  Keep all follow-up visits as told by your health care provider. This is important. ? As your condition improves, your dosage of thyroid hormone medicine may change. ? You will need to have blood tests regularly so that your health care provider can monitor your condition. Contact a health care provider if:  Your symptoms do not get better with treatment.  You are taking thyroid replacement medicine and you: ? Sweat a lot. ? Have tremors. ? Feel anxious. ? Lose weight rapidly. ? Cannot tolerate heat. ? Have emotional swings. ? Have diarrhea. ? Feel weak. Get help right away if you have:  Chest pain.  An irregular heartbeat.  A rapid heartbeat.  Difficulty breathing. Summary  Hypothyroidism is when the thyroid gland does not make enough of certain hormones (it is underactive).  When the thyroid is underactive, it produces too little of the hormones thyroxine (T4) and triiodothyronine (T3).  The most common cause is Hashimoto's disease, a disease in which the body's disease-fighting system (immune system) attacks the thyroid gland. The condition can also be caused by viral infections, medicine, pregnancy, or past   radiation treatment to the head or neck.  Symptoms may include weight gain, dry skin, constipation, feeling as though you do not have energy, and not being able to tolerate cold.  This condition is treated with medicine to replace the thyroid hormones that your body does not make. This information  is not intended to replace advice given to you by your health care provider. Make sure you discuss any questions you have with your health care provider. Document Revised: 03/12/2017 Document Reviewed: 03/10/2017 Elsevier Patient Education  2020 [employer]  

## 2020-01-11 NOTE — Assessment & Plan Note (Signed)
She struggles with daily pain but works with pain management and manages to stay busy and keep the pain tolerable

## 2020-01-11 NOTE — Assessment & Plan Note (Signed)
Encouraged moist heat and gentle stretching as tolerated. May try NSAIDs and prescription meds as directed and report if symptoms worsen or seek immediate care. Check xray and consider chiropractic.

## 2020-01-11 NOTE — Progress Notes (Signed)
Subjective:    [name], female    DOB: Jun 06, 1945, 74 y.o.   MRN: [id number]  Chief Complaint  Patient presents with  . Follow-up    HPI Patient is in today for follow up on chronic medical concerns. No recent febrile illness or hospitalizations. No polyuria or polydipsia. She is struggling daily with back pain but manages fairly well. Unfortunately she is also struggling with right sided neck pain and right jaw pain and her dentist who is trying to make her a new partial is struggling with the malalignment in her jaw. He had referred her to [facility] for some adjustments but that has not helped. Denies CP/palp/SOB/HA/congestion/fevers/GI or GU c/o. Taking meds as prescribed  Past Medical History:  Diagnosis Date  . Abdominal pain 02/01/2015  . Allergy    "seasonal allergies"  . Anemia   . Arthritis    osteoarthritis-"DDD" hips. fingers, toes.  . Blood transfusion without reported diagnosis   . Complication of anesthesia   . Depression   . Depression with anxiety   . Frequent headaches    no problems now.  Marland Kitchen Hearing loss in right ear 03/10/2015   same over last 6 months  . Hip dislocation, right ([place])   . History of chicken pox   . Hyperlipidemia    pt. state no hx  . Hypokalemia 02/04/2017  . Hypothyroidism    supplement used  . Osteoporosis   . Peripheral neuropathy 02/01/2015   right hand"carpal tunnel"  . PONV (postoperative nausea and vomiting)   . Preventative health care 02/04/2017  . Right hip pain 05/24/2015  . Scoliosis 02/01/2015  . Spinal stenosis of lumbar region 10/23/2016  . Thyroid disease   . URI, acute 03/10/2015  . UTI (urinary tract infection) 12/14/2016  . 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 ACETABULAR REVISION Right 10/30/2015   Procedure: RIGHT HIP ACETABULAR REVISION /CONSTRAINED LINER (POSTERIOR APPROACH) ;  Surgeon: Gaynelle Arabian, MD;   Location: [facility];  Service: Orthopedics;  Laterality: Right;  . BREAST EXCISIONAL BIOPSY Right   . BREAST SURGERY     lump removed, and breast reduction  . EYE SURGERY     lasik   2002  . HIP CLOSED REDUCTION Right 06/30/2015   Procedure: CLOSED MANIPULATION HIP;  Surgeon: Melina Schools, MD;  Location: [facility];  Service: Orthopedics;  Laterality: Right;  . JOINT REPLACEMENT     3 hips on right and 1 on left  . METACARPOPHALANGEAL JOINT CAPSULECTOMY / CAPSULOTOMY  1995  . REDUCTION MAMMAPLASTY    . SPINAL CORD STIMULATOR INSERTION N/A 12/02/2016   Procedure: LUMBAR SPINAL CORD STIMULATOR INSERTION;  Surgeon: Melina Schools, MD;  Location: [place];  Service: Orthopedics;  Laterality: N/A;  2.5 hrs  . SPINAL CORD STIMULATOR REMOVAL N/A 12/31/2016   Procedure: LUMBAR SPINAL CORD STIMULATOR REMOVAL;  Surgeon: Melina Schools, MD;  Location: [place];  Service: Orthopedics;  Laterality: N/A;  60 mins  . TONSILLECTOMY    . TOTAL HIP ARTHROPLASTY  2011   left hip  . TOTAL HIP ARTHROPLASTY Right 06/12/2015   Procedure: RIGHT TOTAL HIP ARTHROPLASTY ANTERIOR APPROACH;  Surgeon: Gaynelle Arabian, MD;  Location: [facility];  Service: Orthopedics;  Laterality: Right;  . TOTAL HIP REVISION Right 07/19/2015   Procedure: RIGHT HIP BEARING SURFACE REVISION;  Surgeon: Gaynelle Arabian, MD;  Location: [facility];  Service: Orthopedics;  Laterality:  Right;  Marland Kitchen TUBAL LIGATION  1980   left fallopian tube removal    Family History  Problem Relation Age of Onset  . Arthritis Mother   . Dementia Mother   . Mental illness Father        suicide  . Arthritis Maternal Grandmother   . Kidney disease Son        b/l multicystic kidney disease s/p 4 transplants  . Colon cancer Neg Hx     Social History   Socioeconomic History  . Marital status: Married    Spouse name: Not on file  . Number of children: Not on file  . Years of education: 79  . Highest education level: Not on file  Occupational History  . Occupation: retired    Tobacco Use  . Smoking status: Never Smoker  . Smokeless tobacco: Never Used  Vaping Use  . Vaping Use: Never used  Substance and Sexual Activity  . Alcohol use: Yes    Alcohol/week: 1.0 standard drink    Types: 1 Glasses of wine per week  . Drug use: No  . 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 Not on file  Social History Narrative  . Not on file   Social Determinants of Health   Financial Resource Strain:   . Difficulty of Paying Living Expenses: Not on file  Food Insecurity:   . Worried About Charity fundraiser in the Last Year: Not on file  . Ran Out of Food in the Last Year: Not on file  Transportation Needs:   . Lack of Transportation (Medical): Not on file  . Lack of Transportation (Non-Medical): Not on file  Physical Activity:   . Days of Exercise per Week: Not on file  . Minutes of Exercise per Session: Not on file  Stress:   . Feeling of Stress : Not on file  Social Connections:   . Frequency of Communication with Friends and Family: Not on file  . Frequency of Social Gatherings with Friends and Family: Not on file  . Attends Religious Services: Not on file  . Active Member of Clubs or Organizations: Not on file  . Attends Archivist Meetings: Not on file  . Marital Status: Not on file  Intimate Partner Violence:   . Fear of Current or Ex-Partner: Not on file  . Emotionally Abused: Not on file  . Physically Abused: Not on file  . Sexually Abused: Not on file    Outpatient Medications Prior to Visit  Medication Sig Dispense Refill  . b complex vitamins tablet Take 1 tablet by mouth daily.    . cetirizine (ZYRTEC) 10 MG tablet Take 10 mg by mouth daily.     . cholecalciferol (VITAMIN D) 1000 units tablet Take 1,000 Units by mouth daily.    . DULoxetine (CYMBALTA) 60 MG capsule Take 60 mg by mouth daily.    . furosemide (LASIX) 20 MG tablet TAKE 1  TABLET DAILY 90 tablet 3  . gabapentin (NEURONTIN) 300 MG capsule Take 1 capsule (300 mg total) by mouth at bedtime. 90 capsule 0  . HYDROcodone-acetaminophen (NORCO) 7.5-325 MG tablet Take 1 tablet by mouth 2 (two) times daily as needed.  0  . Ketotifen Fumarate (ZADITOR OP) Place 1 drop into both eyes 2 (two) times daily as needed (itchy eyes).    Marland Kitchen levothyroxine (SYNTHROID) 25 MCG tablet Take 1 tablet (  25 mcg total) by mouth daily before breakfast. 90 tablet 0  . liothyronine (CYTOMEL) 25 MCG tablet TAKE 1 TABLET DAILY 90 tablet 3   No facility-administered medications prior to visit.    Allergies  Allergen Reactions  . Penicillins Hives    Childhood allergy  Has patient had a PCN reaction causing immediate rash, facial/tongue/throat swelling, SOB or lightheadedness with hypotension: No Has patient had a PCN reaction causing severe rash involving mucus membranes or skin necrosis: No Has patient had a PCN reaction that required hospitalization No Has patient had a PCN reaction occurring within the last 10 years: No If all of the above answers are "NO", then may proceed with Cephalosporin use.  . Sulfa Antibiotics Hives    Review of Systems  Constitutional: Positive for malaise/fatigue. Negative for fever.  HENT: Negative for congestion.   Eyes: Negative for blurred vision.  Respiratory: Negative for shortness of breath.   Cardiovascular: Negative for chest pain, palpitations and leg swelling.  Gastrointestinal: Negative for abdominal pain, blood in stool and nausea.  Genitourinary: Negative for dysuria and frequency.  Musculoskeletal: Positive for joint pain and neck pain. Negative for falls.  Skin: Negative for rash.  Neurological: Negative for dizziness, loss of consciousness and headaches.  Endo/Heme/Allergies: Negative for environmental allergies.  Psychiatric/Behavioral: Negative for depression. The patient is nervous/anxious.        Objective:    Physical Exam Vitals  and nursing note reviewed.  Constitutional:      General: She is not in acute distress.    Appearance: She is well-developed.     Comments: cachectic  HENT:     Head: Normocephalic and atraumatic.     Nose: Nose normal.  Eyes:     General:        Right eye: No discharge.        Left eye: No discharge.  Cardiovascular:     Rate and Rhythm: Normal rate and regular rhythm.     Heart sounds: No murmur heard.   Pulmonary:     Effort: Pulmonary effort is normal.     Breath sounds: Normal breath sounds.  Abdominal:     General: Bowel sounds are normal.     Palpations: Abdomen is soft.     Tenderness: There is no abdominal tenderness.  Musculoskeletal:        General: Tenderness present.     Cervical back: Normal range of motion and neck supple.     Comments: Tender with palpation over b/l SCM muslces.   Skin:    General: Skin is warm and dry.  Neurological:     Mental Status: She is alert and oriented to person, place, and time.     BP 98/68   Pulse 66   Temp (!) 97.5 F (36.4 C) (Oral)   Wt 96 lb 12.8 oz (43.9 kg)   SpO2 100%   BMI 16.11 kg/m  Wt Readings from Last 3 Encounters:  01/11/20 96 lb 12.8 oz (43.9 kg)  08/28/19 95 lb 3.2 oz (43.2 kg)  09/20/18 96 lb 3.2 oz (43.6 kg)    Diabetic Foot Exam - Simple   No data filed     Lab Results  Component Value Date   WBC 6.2 09/20/2019   HGB 13.1 09/20/2019   HCT 39.9 09/20/2019   PLT 247.0 09/20/2019   GLUCOSE 91 09/20/2019   CHOL 221 (H) 08/28/2019   TRIG 73.0 08/28/2019   HDL 75.90 08/28/2019   LDLCALC 130 (H) 08/28/2019  ALT 15 09/20/2019   AST 24 09/20/2019   NA 138 09/20/2019   K 3.9 09/20/2019   CL 100 09/20/2019   CREATININE 0.76 09/20/2019   BUN 13 09/20/2019   CO2 33 (H) 09/20/2019   TSH 0.01 (L) 11/28/2019   INR 0.95 10/29/2015    Lab Results  Component Value Date   TSH 0.01 (L) 11/28/2019   Lab Results  Component Value Date   WBC 6.2 09/20/2019   HGB 13.1 09/20/2019   HCT 39.9  09/20/2019   MCV 84.2 09/20/2019   PLT 247.0 09/20/2019   Lab Results  Component Value Date   NA 138 09/20/2019   K 3.9 09/20/2019   CO2 33 (H) 09/20/2019   GLUCOSE 91 09/20/2019   BUN 13 09/20/2019   CREATININE 0.76 09/20/2019   BILITOT 0.5 09/20/2019   ALKPHOS 48 09/20/2019   AST 24 09/20/2019   ALT 15 09/20/2019   PROT 6.4 09/20/2019   ALBUMIN 4.4 09/20/2019   CALCIUM 9.9 09/20/2019   ANIONGAP 9 11/30/2016   GFR 74.11 09/20/2019   Lab Results  Component Value Date   CHOL 221 (H) 08/28/2019   Lab Results  Component Value Date   HDL 75.90 08/28/2019   Lab Results  Component Value Date   LDLCALC 130 (H) 08/28/2019   Lab Results  Component Value Date   TRIG 73.0 08/28/2019   Lab Results  Component Value Date   CHOLHDL 3 08/28/2019   No results found for: HGBA1C     Assessment & Plan:   Problem List Items Addressed This Visit    Hyperlipidemia    Encouraged heart healthy diet, increase exercise, avoid trans fats, consider a krill oil cap daily      Relevant Orders   Lipid panel   Thyroid disease    On Levothyroxine and cytomel and has been so for years. Will continue to monitor and since she is over treated we will check labs again ad consider further adjustments to meds, have already decreased her Levothyroxine.       Relevant Orders   TSH   T4, free   T3, free   Thyroid peroxidase antibody   Palpitations   Relevant Orders   CBC   Comprehensive metabolic panel   Back pain    She struggles with daily pain but works with pain management and manages to stay busy and keep the pain tolerable      Dilatation of aorta (H[facility])   Chronic jaw pain    She is working with her dentist to make her a new partial but they are having trouble with alignment of her jaw. Proceed with xrays and consider chiropractic adjustments. If no improvement then may consider consultation with oromaxillary surgeon      Relevant Orders   DG Facial Bones Complete   Ambulatory  referral to Chiropractic   Neck pain    Encouraged moist heat and gentle stretching as tolerated. May try NSAIDs and prescription meds as directed and report if symptoms worsen or seek immediate care. Check xray and consider chiropractic.       Relevant Orders   DG Facial Bones Complete   DG Cervical Spine 2 or 3 views   Ambulatory referral to Chiropractic    Other Visit Diagnoses    Influenza vaccine administered    -  Primary   Relevant Orders   Flu Vaccine QUAD High Dose(Fluad) (Completed)   Facial pain       Relevant Orders  DG Facial Bones Complete      I am having Linette L. Vogt-Nichols "Marlowe Kays" maintain her cetirizine, b complex vitamins, cholecalciferol, DULoxetine, Ketotifen Fumarate (ZADITOR OP), HYDROcodone-acetaminophen, furosemide, liothyronine, gabapentin, and levothyroxine.  No orders of the defined types were placed in this encounter.    Penni Homans, MD

## 2020-01-11 NOTE — Assessment & Plan Note (Signed)
Encouraged heart healthy diet, increase exercise, avoid trans fats, consider a krill oil cap daily 

## 2020-01-12 ENCOUNTER — Telehealth: Payer: Self-pay | Admitting: Family Medicine

## 2020-01-12 ENCOUNTER — Ambulatory Visit (HOSPITAL_BASED_OUTPATIENT_CLINIC_OR_DEPARTMENT_OTHER)
Admission: RE | Admit: 2020-01-12 | Discharge: 2020-01-12 | Disposition: A | Discharge status: Home / Self Care | Payer: Medicare Other | Source: Ambulatory Visit | Attending: Family Medicine | Admitting: Family Medicine

## 2020-01-12 ENCOUNTER — Other Ambulatory Visit: Payer: Self-pay | Admitting: Family Medicine

## 2020-01-12 DIAGNOSIS — M542 Cervicalgia: Secondary | ICD-10-CM | POA: Insufficient documentation

## 2020-01-12 DIAGNOSIS — R222 Localized swelling, mass and lump, trunk: Secondary | ICD-10-CM | POA: Diagnosis not present

## 2020-01-12 DIAGNOSIS — M47812 Spondylosis without myelopathy or radiculopathy, cervical region: Secondary | ICD-10-CM | POA: Diagnosis not present

## 2020-01-12 DIAGNOSIS — G8929 Other chronic pain: Secondary | ICD-10-CM

## 2020-01-12 DIAGNOSIS — R519 Headache, unspecified: Secondary | ICD-10-CM | POA: Diagnosis not present

## 2020-01-12 DIAGNOSIS — R6884 Jaw pain: Secondary | ICD-10-CM | POA: Diagnosis not present

## 2020-01-12 DIAGNOSIS — M4312 Spondylolisthesis, cervical region: Secondary | ICD-10-CM | POA: Diagnosis not present

## 2020-01-12 NOTE — Telephone Encounter (Signed)
[facility] called stating the facial Xray that was ordered for patient may not show the TMJ concerns your looking for. She recommended  Patient having either Panoramic xray or CT of facial so it can see the joints.  Please advise if a  New order can be placed or if you would like patient to still just have xray. She did say that she went on and did the c-spine xray.

## 2020-01-12 NOTE — Telephone Encounter (Signed)
I sent xray a chat message it was ok to change it. I do not order this much so I have reordered what I think they want. Please let patient and radiology know

## 2020-01-12 NOTE — Progress Notes (Unsigned)
pa

## 2020-01-13 ENCOUNTER — Telehealth: Payer: Self-pay

## 2020-01-13 NOTE — Telephone Encounter (Signed)
Called patient and patient states X-Ray has been done, but it may not show enough of the TMJ.   What she would be needed would be the panoramic x-ray from the Oral Maxillary Surgeon.  Results should be ready soon for review.

## 2020-01-14 NOTE — Telephone Encounter (Signed)
I ordered the panoramic view after she had her neck xray done so she will likely need to come back to have it done.

## 2020-01-15 NOTE — Telephone Encounter (Signed)
Patient notified

## 2020-01-15 NOTE — Telephone Encounter (Signed)
Patient has been notified and provider has ordered the necessary test for the panoramic x-ray, in which the patient has agreed.

## 2020-01-16 ENCOUNTER — Encounter: Payer: Self-pay | Admitting: Family Medicine

## 2020-01-16 NOTE — Telephone Encounter (Signed)
Per pt please advise.

## 2020-01-17 NOTE — Telephone Encounter (Signed)
So unfortunately there are 2 different protocols for the spine and the TMJ but I do believe they could do them at the same time if she wants to proceed. Please confirm

## 2020-01-19 ENCOUNTER — Other Ambulatory Visit: Payer: Self-pay | Admitting: Family Medicine

## 2020-01-19 DIAGNOSIS — M542 Cervicalgia: Secondary | ICD-10-CM

## 2020-01-19 DIAGNOSIS — M26621 Arthralgia of right temporomandibular joint: Secondary | ICD-10-CM

## 2020-01-19 NOTE — Telephone Encounter (Signed)
Pt would like to have [facility]

## 2020-02-02 ENCOUNTER — Ambulatory Visit (HOSPITAL_COMMUNITY)
Admission: RE | Admit: 2020-02-02 | Discharge: 2020-02-02 | Disposition: A | Discharge status: Home / Self Care | Payer: Medicare Other | Source: Ambulatory Visit | Attending: Family Medicine | Admitting: Family Medicine

## 2020-02-02 ENCOUNTER — Ambulatory Visit (HOSPITAL_COMMUNITY): Payer: Medicare Other

## 2020-02-02 ENCOUNTER — Other Ambulatory Visit: Payer: Self-pay

## 2020-02-02 DIAGNOSIS — M5030 Other cervical disc degeneration, unspecified cervical region: Secondary | ICD-10-CM | POA: Diagnosis not present

## 2020-02-02 DIAGNOSIS — R6884 Jaw pain: Secondary | ICD-10-CM | POA: Insufficient documentation

## 2020-02-02 DIAGNOSIS — Z5181 Encounter for therapeutic drug level monitoring: Secondary | ICD-10-CM | POA: Diagnosis not present

## 2020-02-02 DIAGNOSIS — G8929 Other chronic pain: Secondary | ICD-10-CM | POA: Diagnosis not present

## 2020-02-02 DIAGNOSIS — G894 Chronic pain syndrome: Secondary | ICD-10-CM | POA: Diagnosis not present

## 2020-02-02 DIAGNOSIS — M26601 Right temporomandibular joint disorder, unspecified: Secondary | ICD-10-CM | POA: Diagnosis not present

## 2020-02-02 DIAGNOSIS — M26609 Unspecified temporomandibular joint disorder, unspecified side: Secondary | ICD-10-CM | POA: Diagnosis not present

## 2020-02-02 DIAGNOSIS — Z79899 Other long term (current) drug therapy: Secondary | ICD-10-CM | POA: Diagnosis not present

## 2020-02-03 ENCOUNTER — Ambulatory Visit (HOSPITAL_BASED_OUTPATIENT_CLINIC_OR_DEPARTMENT_OTHER)
Admission: RE | Admit: 2020-02-03 | Discharge: 2020-02-03 | Disposition: A | Discharge status: Home / Self Care | Payer: Medicare Other | Source: Ambulatory Visit | Attending: Family Medicine | Admitting: Family Medicine

## 2020-02-03 DIAGNOSIS — M542 Cervicalgia: Secondary | ICD-10-CM | POA: Diagnosis not present

## 2020-02-03 DIAGNOSIS — M26621 Arthralgia of right temporomandibular joint: Secondary | ICD-10-CM | POA: Diagnosis not present

## 2020-02-03 DIAGNOSIS — M4802 Spinal stenosis, cervical region: Secondary | ICD-10-CM | POA: Diagnosis not present

## 2020-02-03 DIAGNOSIS — M50222 Other cervical disc displacement at C5-C6 level: Secondary | ICD-10-CM | POA: Diagnosis not present

## 2020-02-03 DIAGNOSIS — M5021 Other cervical disc displacement,  high cervical region: Secondary | ICD-10-CM | POA: Diagnosis not present

## 2020-02-03 DIAGNOSIS — G8929 Other chronic pain: Secondary | ICD-10-CM | POA: Diagnosis not present

## 2020-02-03 DIAGNOSIS — M2669 Other specified disorders of temporomandibular joint: Secondary | ICD-10-CM | POA: Diagnosis not present

## 2020-02-05 ENCOUNTER — Other Ambulatory Visit: Payer: Self-pay | Admitting: Family Medicine

## 2020-02-05 ENCOUNTER — Telehealth: Payer: Self-pay | Admitting: Family Medicine

## 2020-02-05 DIAGNOSIS — G8929 Other chronic pain: Secondary | ICD-10-CM

## 2020-02-05 NOTE — Telephone Encounter (Signed)
Referral placed.

## 2020-02-05 NOTE — Progress Notes (Signed)
Pt called information given to pt doesn't wish to see neurosurgeon unless medically neccessary

## 2020-02-05 NOTE — Progress Notes (Signed)
Pt called and given information. States she's not interested in any surgery unless her PCP finds in absolutely needed.

## 2020-02-05 NOTE — Telephone Encounter (Signed)
Pt is requesting a referral for oral maxillary provider. She states her chewing is getting worst and she enjoys eating.

## 2020-02-05 NOTE — Telephone Encounter (Signed)
Patient states that she would like a call back in reference to a test

## 2020-02-20 ENCOUNTER — Ambulatory Visit: Payer: Medicare Other | Attending: Internal Medicine

## 2020-02-20 ENCOUNTER — Other Ambulatory Visit (HOSPITAL_BASED_OUTPATIENT_CLINIC_OR_DEPARTMENT_OTHER): Payer: Self-pay | Admitting: Internal Medicine

## 2020-02-20 DIAGNOSIS — Z23 Encounter for immunization: Secondary | ICD-10-CM

## 2020-02-23 MED FILL — PFIZER-BIONTECH COVID-19 VA: 30 | 1 days supply | Qty: 0 | Fill #0

## 2020-02-27 NOTE — Addendum Note (Signed)
Addended by: Kelle Darting A on: 02/27/2020 11:51 AM   Modules accepted: Orders

## 2020-02-27 NOTE — Addendum Note (Signed)
Addended by: Kelle Darting A on: 02/27/2020 11:50 AM   Modules accepted: Orders

## 2020-02-29 ENCOUNTER — Other Ambulatory Visit (INDEPENDENT_AMBULATORY_CARE_PROVIDER_SITE_OTHER): Payer: Medicare Other

## 2020-02-29 ENCOUNTER — Other Ambulatory Visit: Payer: Self-pay

## 2020-02-29 DIAGNOSIS — R002 Palpitations: Secondary | ICD-10-CM

## 2020-02-29 DIAGNOSIS — E079 Disorder of thyroid, unspecified: Secondary | ICD-10-CM

## 2020-02-29 DIAGNOSIS — E785 Hyperlipidemia, unspecified: Secondary | ICD-10-CM | POA: Diagnosis not present

## 2020-02-29 LAB — CBC
HCT: 37.8 % (ref 36.0–46.0)
Hemoglobin: 12.5 g/dL (ref 12.0–15.0)
MCHC: 33 g/dL (ref 30.0–36.0)
MCV: 83.5 fl (ref 78.0–100.0)
Platelets: 253 10*3/uL (ref 150.0–400.0)
RBC: 4.52 Mil/uL (ref 3.87–5.11)
RDW: 13.7 % (ref 11.5–15.5)
WBC: 9.5 10*3/uL (ref 4.0–10.5)

## 2020-02-29 LAB — COMPREHENSIVE METABOLIC PANEL
ALT: 16 U/L (ref 0–35)
AST: 21 U/L (ref 0–37)
Albumin: 4.1 g/dL (ref 3.5–5.2)
Alkaline Phosphatase: 48 U/L (ref 39–117)
BUN: 10 mg/dL (ref 6–23)
CO2: 35 mEq/L — ABNORMAL HIGH (ref 19–32)
Calcium: 9.7 mg/dL (ref 8.4–10.5)
Chloride: 96 mEq/L (ref 96–112)
Creatinine, Ser: 0.87 mg/dL (ref 0.40–1.20)
GFR: 64.98 mL/min (ref >60.00)
Glucose, Bld: 94 mg/dL (ref 70–99)
Potassium: 3.8 mEq/L (ref 3.5–5.1)
Sodium: 137 mEq/L (ref 135–145)
Total Bilirubin: 0.6 mg/dL (ref 0.2–1.2)
Total Protein: 6.4 g/dL (ref 6.0–8.3)

## 2020-02-29 LAB — LIPID PANEL
Cholesterol: 212 mg/dL — ABNORMAL HIGH (ref 0–200)
HDL: 71 mg/dL (ref >39.00)
LDL Cholesterol: 127 mg/dL — ABNORMAL HIGH (ref 0–99)
NonHDL: 140.64
Total CHOL/HDL Ratio: 3
Triglycerides: 68 mg/dL (ref 0.0–149.0)
VLDL: 13.6 mg/dL (ref 0.0–40.0)

## 2020-02-29 LAB — T3, FREE: T3, Free: 6.8 pg/mL — ABNORMAL HIGH (ref 2.3–4.2)

## 2020-02-29 LAB — TSH: TSH: 0.01 u[IU]/mL — ABNORMAL LOW (ref 0.35–4.50)

## 2020-02-29 LAB — T4, FREE: Free T4: 0.45 ng/dL — ABNORMAL LOW (ref 0.60–1.60)

## 2020-03-01 LAB — THYROID PEROXIDASE ANTIBODY: Thyroperoxidase Ab SerPl-aCnc: 2 IU/mL (ref <9)

## 2020-03-12 ENCOUNTER — Other Ambulatory Visit: Payer: Self-pay

## 2020-03-12 ENCOUNTER — Encounter: Payer: Self-pay | Admitting: Family Medicine

## 2020-03-12 ENCOUNTER — Ambulatory Visit (INDEPENDENT_AMBULATORY_CARE_PROVIDER_SITE_OTHER): Payer: Medicare Other | Admitting: Family Medicine

## 2020-03-12 DIAGNOSIS — M542 Cervicalgia: Secondary | ICD-10-CM

## 2020-03-12 DIAGNOSIS — G4700 Insomnia, unspecified: Secondary | ICD-10-CM

## 2020-03-12 DIAGNOSIS — T7840XD Allergy, unspecified, subsequent encounter: Secondary | ICD-10-CM

## 2020-03-12 DIAGNOSIS — M549 Dorsalgia, unspecified: Secondary | ICD-10-CM

## 2020-03-12 DIAGNOSIS — E785 Hyperlipidemia, unspecified: Secondary | ICD-10-CM

## 2020-03-12 DIAGNOSIS — E079 Disorder of thyroid, unspecified: Secondary | ICD-10-CM | POA: Diagnosis not present

## 2020-03-12 NOTE — Patient Instructions (Signed)

## 2020-03-12 NOTE — Assessment & Plan Note (Signed)
Encouraged moist heat and gentle stretching as tolerated. May try NSAIDs and prescription meds as directed and report if symptoms worsen or seek immediate care. Is afraid to proceed with chiropractic. Cannot afford massages.

## 2020-03-14 NOTE — Assessment & Plan Note (Signed)
Encouraged good sleep hygiene such as dark, quiet room. No blue/green glowing lights such as computer screens in bedroom. No alcohol or stimulants in evening. Cut down on caffeine as able. Regular exercise is helpful but not just prior to bed time.  

## 2020-03-14 NOTE — Assessment & Plan Note (Signed)
Encouraged heart healthy diet, increase exercise, avoid trans fats, consider a krill oil cap daily 

## 2020-03-14 NOTE — Assessment & Plan Note (Signed)
  TSH slightly improved but still abnormal. Patient asymptomatic so will continue to monitor

## 2020-03-14 NOTE — Progress Notes (Signed)
Subjective:    [name], female    DOB: 05/07/1945, 74 y.o.   MRN: [id number]  Chief Complaint  Patient presents with  . Follow-up    HPI Patient is in today for follow up on chronic medical concerns. No recent febrile illness or hospitalizations. She continues to struggle with daily pain from neck to sacrum and major joints but she finds a way to maintain her ADLs without significant meds. No recent fall or injury. Denies CP/palp/SOB/HA/congestion/fevers/GI or GU c/o. Taking meds as prescribed  Past Medical History:  Diagnosis Date  . Abdominal pain 02/01/2015  . Allergy    "seasonal allergies"  . Anemia   . Arthritis    osteoarthritis-"DDD" hips. fingers, toes.  . Blood transfusion without reported diagnosis   . Complication of anesthesia   . Depression   . Depression with anxiety   . Frequent headaches    no problems now.  Marland Kitchen Hearing loss in right ear 03/10/2015   same over last 6 months  . Hip dislocation, right ([place])   . History of chicken pox   . Hyperlipidemia    pt. state no hx  . Hypokalemia 02/04/2017  . Hypothyroidism    supplement used  . Osteoporosis   . Peripheral neuropathy 02/01/2015   right hand"carpal tunnel"  . PONV (postoperative nausea and vomiting)   . Preventative health care 02/04/2017  . Right hip pain 05/24/2015  . Scoliosis 02/01/2015  . Spinal stenosis of lumbar region 10/23/2016  . Thyroid disease   . URI, acute 03/10/2015  . UTI (urinary tract infection) 12/14/2016  . 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 ACETABULAR REVISION Right 10/30/2015   Procedure: RIGHT HIP ACETABULAR REVISION /CONSTRAINED LINER (POSTERIOR APPROACH) ;  Surgeon: Gaynelle Arabian, MD;  Location: [facility];  Service: Orthopedics;  Laterality: Right;  . BREAST EXCISIONAL BIOPSY Right   . BREAST SURGERY     lump removed, and breast reduction  . EYE SURGERY     lasik   2002  .  HIP CLOSED REDUCTION Right 06/30/2015   Procedure: CLOSED MANIPULATION HIP;  Surgeon: Melina Schools, MD;  Location: [facility];  Service: Orthopedics;  Laterality: Right;  . JOINT REPLACEMENT     3 hips on right and 1 on left  . METACARPOPHALANGEAL JOINT CAPSULECTOMY / CAPSULOTOMY  1995  . REDUCTION MAMMAPLASTY    . SPINAL CORD STIMULATOR INSERTION N/A 12/02/2016   Procedure: LUMBAR SPINAL CORD STIMULATOR INSERTION;  Surgeon: Melina Schools, MD;  Location: [place];  Service: Orthopedics;  Laterality: N/A;  2.5 hrs  . SPINAL CORD STIMULATOR REMOVAL N/A 12/31/2016   Procedure: LUMBAR SPINAL CORD STIMULATOR REMOVAL;  Surgeon: Melina Schools, MD;  Location: [place];  Service: Orthopedics;  Laterality: N/A;  60 mins  . TONSILLECTOMY    . TOTAL HIP ARTHROPLASTY  2011   left hip  . TOTAL HIP ARTHROPLASTY Right 06/12/2015   Procedure: RIGHT TOTAL HIP ARTHROPLASTY ANTERIOR APPROACH;  Surgeon: Gaynelle Arabian, MD;  Location: [facility];  Service: Orthopedics;  Laterality: Right;  . TOTAL HIP REVISION Right 07/19/2015   Procedure: RIGHT HIP BEARING SURFACE REVISION;  Surgeon: Gaynelle Arabian, MD;  Location: [facility];  Service: Orthopedics;  Laterality: Right;  . TUBAL LIGATION  1980   left fallopian tube removal    Family History  Problem Relation Age of Onset  . Arthritis Mother   . Dementia Mother   .  Mental illness Father        suicide  . Arthritis Maternal Grandmother   . Kidney disease Son        b/l multicystic kidney disease s/p 4 transplants  . Colon cancer Neg Hx     Social History   Socioeconomic History  . Marital status: Married    Spouse name: Not on file  . Number of children: Not on file  . Years of education: 54  . Highest education level: Not on file  Occupational History  . Occupation: retired  Tobacco Use  . Smoking status: Never Smoker  . Smokeless tobacco: Never Used  Vaping Use  . Vaping Use: Never used  Substance and Sexual Activity  . Alcohol use: Yes    Alcohol/week: 1.0  standard drink    Types: 1 Glasses of wine per week  . Drug use: No  . 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 Not on file  Social History Narrative  . Not on file   Social Determinants of Health   Financial Resource Strain:   . Difficulty of Paying Living Expenses: Not on file  Food Insecurity:   . Worried About Charity fundraiser in the Last Year: Not on file  . Ran Out of Food in the Last Year: Not on file  Transportation Needs:   . Lack of Transportation (Medical): Not on file  . Lack of Transportation (Non-Medical): Not on file  Physical Activity:   . Days of Exercise per Week: Not on file  . Minutes of Exercise per Session: Not on file  Stress:   . Feeling of Stress : Not on file  Social Connections:   . Frequency of Communication with Friends and Family: Not on file  . Frequency of Social Gatherings with Friends and Family: Not on file  . Attends Religious Services: Not on file  . Active Member of Clubs or Organizations: Not on file  . Attends Archivist Meetings: Not on file  . Marital Status: Not on file  Intimate Partner Violence:   . Fear of Current or Ex-Partner: Not on file  . Emotionally Abused: Not on file  . Physically Abused: Not on file  . Sexually Abused: Not on file    Outpatient Medications Prior to Visit  Medication Sig Dispense Refill  . b complex vitamins tablet Take 1 tablet by mouth daily.    . cetirizine (ZYRTEC) 10 MG tablet Take 10 mg by mouth daily.     . cholecalciferol (VITAMIN D) 1000 units tablet Take 1,000 Units by mouth daily.    . DULoxetine (CYMBALTA) 60 MG capsule Take 60 mg by mouth daily.    . furosemide (LASIX) 20 MG tablet TAKE 1 TABLET DAILY 90 tablet 3  . gabapentin (NEURONTIN) 300 MG capsule Take 1 capsule (300 mg total) by mouth at bedtime. 90 capsule 0  . HYDROcodone-acetaminophen (NORCO) 10-325 MG tablet  hydrocodone 10 mg-acetaminophen 325 mg tablet  TAKE 1 TABLET BY MOUTH THREE TIMES DAILY    . Ketotifen Fumarate (ZADITOR OP) Place 1 drop into both eyes 2 (two) times daily as needed (itchy eyes).    Marland Kitchen levothyroxine (SYNTHROID) 25 MCG tablet TAKE 1 TABLET DAILY BEFORE BREAKFAST 90 tablet 3  . liothyronine (CYTOMEL) 25 MCG tablet TAKE 1 TABLET DAILY 90 tablet 3  . HYDROcodone-acetaminophen (NORCO) 7.5-325 MG tablet Take 1 tablet by mouth 2 (  two) times daily as needed.  0   No facility-administered medications prior to visit.    Allergies  Allergen Reactions  . Penicillins Hives    Childhood allergy  Has patient had a PCN reaction causing immediate rash, facial/tongue/throat swelling, SOB or lightheadedness with hypotension: No Has patient had a PCN reaction causing severe rash involving mucus membranes or skin necrosis: No Has patient had a PCN reaction that required hospitalization No Has patient had a PCN reaction occurring within the last 10 years: No If all of the above answers are "NO", then may proceed with Cephalosporin use.  . Sulfa Antibiotics Hives    Review of Systems  Constitutional: Positive for malaise/fatigue. Negative for fever.  HENT: Negative for congestion.   Eyes: Negative for blurred vision.  Respiratory: Negative for shortness of breath.   Cardiovascular: Negative for chest pain, palpitations and leg swelling.  Gastrointestinal: Negative for abdominal pain, blood in stool and nausea.  Genitourinary: Negative for dysuria and frequency.  Musculoskeletal: Positive for back pain, joint pain, myalgias and neck pain. Negative for falls.  Skin: Negative for rash.  Neurological: Negative for dizziness, loss of consciousness and headaches.  Endo/Heme/Allergies: Negative for environmental allergies.  Psychiatric/Behavioral: Negative for depression. The patient is not nervous/anxious.        Objective:    Physical Exam Vitals and nursing note reviewed.    Constitutional:      General: She is not in acute distress.    Appearance: She is well-developed.  HENT:     Head: Normocephalic and atraumatic.     Nose: Nose normal.  Eyes:     General:        Right eye: No discharge.        Left eye: No discharge.  Cardiovascular:     Rate and Rhythm: Normal rate and regular rhythm.     Heart sounds: No murmur heard.   Pulmonary:     Effort: Pulmonary effort is normal.     Breath sounds: Normal breath sounds.  Abdominal:     General: Bowel sounds are normal.     Palpations: Abdomen is soft.     Tenderness: There is no abdominal tenderness.  Musculoskeletal:     Cervical back: Normal range of motion and neck supple.  Skin:    General: Skin is warm and dry.  Neurological:     Mental Status: She is alert and oriented to person, place, and time.     BP 102/68   Pulse 67   Temp 98.2 F (36.8 C) (Oral)   Resp 16   Wt 98 lb 3.2 oz (44.5 kg)   SpO2 92%   BMI 16.34 kg/m  Wt Readings from Last 3 Encounters:  03/12/20 98 lb 3.2 oz (44.5 kg)  01/11/20 96 lb 12.8 oz (43.9 kg)  08/28/19 95 lb 3.2 oz (43.2 kg)    Diabetic Foot Exam - Simple   No data filed     Lab Results  Component Value Date   WBC 9.5 02/29/2020   HGB 12.5 02/29/2020   HCT 37.8 02/29/2020   PLT 253.0 02/29/2020   GLUCOSE 94 02/29/2020   CHOL 212 (H) 02/29/2020   TRIG 68.0 02/29/2020   HDL 71.00 02/29/2020   LDLCALC 127 (H) 02/29/2020   ALT 16 02/29/2020   AST 21 02/29/2020   NA 137 02/29/2020   K 3.8 02/29/2020   CL 96 02/29/2020   CREATININE 0.87 02/29/2020   BUN 10 02/29/2020   CO2 35 (H)  02/29/2020   TSH 0.01 (L) 02/29/2020   INR 0.95 10/29/2015    Lab Results  Component Value Date   TSH 0.01 (L) 02/29/2020   Lab Results  Component Value Date   WBC 9.5 02/29/2020   HGB 12.5 02/29/2020   HCT 37.8 02/29/2020   MCV 83.5 02/29/2020   PLT 253.0 02/29/2020   Lab Results  Component Value Date   NA 137 02/29/2020   K 3.8 02/29/2020   CO2 35  (H) 02/29/2020   GLUCOSE 94 02/29/2020   BUN 10 02/29/2020   CREATININE 0.87 02/29/2020   BILITOT 0.6 02/29/2020   ALKPHOS 48 02/29/2020   AST 21 02/29/2020   ALT 16 02/29/2020   PROT 6.4 02/29/2020   ALBUMIN 4.1 02/29/2020   CALCIUM 9.7 02/29/2020   ANIONGAP 9 11/30/2016   GFR 64.98 02/29/2020   Lab Results  Component Value Date   CHOL 212 (H) 02/29/2020   Lab Results  Component Value Date   HDL 71.00 02/29/2020   Lab Results  Component Value Date   LDLCALC 127 (H) 02/29/2020   Lab Results  Component Value Date   TRIG 68.0 02/29/2020   Lab Results  Component Value Date   CHOLHDL 3 02/29/2020   No results found for: HGBA1C     Assessment & Plan:   Problem List Items Addressed This Visit    Allergy   Hyperlipidemia    Encouraged heart healthy diet, increase exercise, avoid trans fats, consider a krill oil cap daily      Thyroid disease     TSH slightly improved but still abnormal. Patient asymptomatic so will continue to monitor      Back pain    Diffuse neck and back pain continue to be her main health related concern. She manages her ADLs and manages her pain with minimal medications as they do not help much any way. Encouraged moist heat and gentle stretching as tolerated. May try NSAIDs and prescription meds as directed and report if symptoms worsen or seek immediate care      Relevant Medications   HYDROcodone-acetaminophen (NORCO) 10-325 MG tablet   Insomnia    Encouraged good sleep hygiene such as dark, quiet room. No blue/green glowing lights such as computer screens in bedroom. No alcohol or stimulants in evening. Cut down on caffeine as able. Regular exercise is helpful but not just prior to bed time.       Neck pain    Encouraged moist heat and gentle stretching as tolerated. May try NSAIDs and prescription meds as directed and report if symptoms worsen or seek immediate care. Is afraid to proceed with chiropractic. Cannot afford massages.            I am having Liliauna L. Vogt-Nichols "Marlowe Kays" maintain her cetirizine, b complex vitamins, cholecalciferol, DULoxetine, Ketotifen Fumarate (ZADITOR OP), furosemide, liothyronine, gabapentin, levothyroxine, and HYDROcodone-acetaminophen.  No orders of the defined types were placed in this encounter.    Penni Homans, MD

## 2020-03-14 NOTE — Assessment & Plan Note (Signed)
Diffuse neck and back pain continue to be her main health related concern. She manages her ADLs and manages her pain with minimal medications as they do not help much any way. Encouraged moist heat and gentle stretching as tolerated. May try NSAIDs and prescription meds as directed and report if symptoms worsen or seek immediate care

## 2020-04-12 ENCOUNTER — Other Ambulatory Visit: Payer: Self-pay | Admitting: Family Medicine

## 2020-05-02 DIAGNOSIS — R2689 Other abnormalities of gait and mobility: Secondary | ICD-10-CM | POA: Diagnosis not present

## 2020-05-02 DIAGNOSIS — Z79899 Other long term (current) drug therapy: Secondary | ICD-10-CM | POA: Diagnosis not present

## 2020-05-02 DIAGNOSIS — G894 Chronic pain syndrome: Secondary | ICD-10-CM | POA: Diagnosis not present

## 2020-05-02 DIAGNOSIS — M545 Low back pain, unspecified: Secondary | ICD-10-CM | POA: Diagnosis not present

## 2020-05-02 DIAGNOSIS — M26609 Unspecified temporomandibular joint disorder, unspecified side: Secondary | ICD-10-CM | POA: Diagnosis not present

## 2020-05-02 DIAGNOSIS — M542 Cervicalgia: Secondary | ICD-10-CM | POA: Diagnosis not present

## 2020-05-15 DIAGNOSIS — M542 Cervicalgia: Secondary | ICD-10-CM | POA: Diagnosis not present

## 2020-05-15 DIAGNOSIS — G894 Chronic pain syndrome: Secondary | ICD-10-CM | POA: Diagnosis not present

## 2020-05-22 DIAGNOSIS — M542 Cervicalgia: Secondary | ICD-10-CM | POA: Diagnosis not present

## 2020-05-22 DIAGNOSIS — G894 Chronic pain syndrome: Secondary | ICD-10-CM | POA: Diagnosis not present

## 2020-05-24 DIAGNOSIS — M542 Cervicalgia: Secondary | ICD-10-CM | POA: Diagnosis not present

## 2020-05-24 DIAGNOSIS — G894 Chronic pain syndrome: Secondary | ICD-10-CM | POA: Diagnosis not present

## 2020-05-28 DIAGNOSIS — M542 Cervicalgia: Secondary | ICD-10-CM | POA: Diagnosis not present

## 2020-05-28 DIAGNOSIS — G894 Chronic pain syndrome: Secondary | ICD-10-CM | POA: Diagnosis not present

## 2020-05-31 DIAGNOSIS — G894 Chronic pain syndrome: Secondary | ICD-10-CM | POA: Diagnosis not present

## 2020-05-31 DIAGNOSIS — M542 Cervicalgia: Secondary | ICD-10-CM | POA: Diagnosis not present

## 2020-06-04 DIAGNOSIS — G894 Chronic pain syndrome: Secondary | ICD-10-CM | POA: Diagnosis not present

## 2020-06-04 DIAGNOSIS — M542 Cervicalgia: Secondary | ICD-10-CM | POA: Diagnosis not present

## 2020-06-07 DIAGNOSIS — G894 Chronic pain syndrome: Secondary | ICD-10-CM | POA: Diagnosis not present

## 2020-06-07 DIAGNOSIS — M542 Cervicalgia: Secondary | ICD-10-CM | POA: Diagnosis not present

## 2020-06-11 DIAGNOSIS — M542 Cervicalgia: Secondary | ICD-10-CM | POA: Diagnosis not present

## 2020-06-11 DIAGNOSIS — G894 Chronic pain syndrome: Secondary | ICD-10-CM | POA: Diagnosis not present

## 2020-06-13 DIAGNOSIS — G894 Chronic pain syndrome: Secondary | ICD-10-CM | POA: Diagnosis not present

## 2020-06-13 DIAGNOSIS — M542 Cervicalgia: Secondary | ICD-10-CM | POA: Diagnosis not present

## 2020-06-18 DIAGNOSIS — M542 Cervicalgia: Secondary | ICD-10-CM | POA: Diagnosis not present

## 2020-06-18 DIAGNOSIS — G894 Chronic pain syndrome: Secondary | ICD-10-CM | POA: Diagnosis not present

## 2020-06-21 DIAGNOSIS — M542 Cervicalgia: Secondary | ICD-10-CM | POA: Diagnosis not present

## 2020-06-21 DIAGNOSIS — M5416 Radiculopathy, lumbar region: Secondary | ICD-10-CM | POA: Diagnosis not present

## 2020-06-27 DIAGNOSIS — M542 Cervicalgia: Secondary | ICD-10-CM | POA: Diagnosis not present

## 2020-06-27 DIAGNOSIS — M5416 Radiculopathy, lumbar region: Secondary | ICD-10-CM | POA: Diagnosis not present

## 2020-07-01 DIAGNOSIS — M542 Cervicalgia: Secondary | ICD-10-CM | POA: Diagnosis not present

## 2020-07-01 DIAGNOSIS — M5416 Radiculopathy, lumbar region: Secondary | ICD-10-CM | POA: Diagnosis not present

## 2020-07-04 DIAGNOSIS — G894 Chronic pain syndrome: Secondary | ICD-10-CM | POA: Diagnosis not present

## 2020-07-04 DIAGNOSIS — M542 Cervicalgia: Secondary | ICD-10-CM | POA: Diagnosis not present

## 2020-07-09 DIAGNOSIS — M542 Cervicalgia: Secondary | ICD-10-CM | POA: Diagnosis not present

## 2020-07-09 DIAGNOSIS — G894 Chronic pain syndrome: Secondary | ICD-10-CM | POA: Diagnosis not present

## 2020-07-11 DIAGNOSIS — M542 Cervicalgia: Secondary | ICD-10-CM | POA: Diagnosis not present

## 2020-07-11 DIAGNOSIS — M5416 Radiculopathy, lumbar region: Secondary | ICD-10-CM | POA: Diagnosis not present

## 2020-07-11 DIAGNOSIS — G894 Chronic pain syndrome: Secondary | ICD-10-CM | POA: Diagnosis not present

## 2020-07-16 ENCOUNTER — Ambulatory Visit: Payer: Medicare Other | Attending: Internal Medicine

## 2020-07-16 DIAGNOSIS — M5416 Radiculopathy, lumbar region: Secondary | ICD-10-CM | POA: Diagnosis not present

## 2020-07-16 DIAGNOSIS — G894 Chronic pain syndrome: Secondary | ICD-10-CM | POA: Diagnosis not present

## 2020-07-16 DIAGNOSIS — Z23 Encounter for immunization: Secondary | ICD-10-CM

## 2020-07-16 DIAGNOSIS — M542 Cervicalgia: Secondary | ICD-10-CM | POA: Diagnosis not present

## 2020-07-16 NOTE — Progress Notes (Signed)
   Covid-19 Vaccination Clinic  Name:  Misty Barker    MRN: [id number] DOB: 11/08/1945  07/16/2020  Misty Barker was observed post Covid-19 immunization for 15 minutes without incident. She was provided with Vaccine Information Sheet and instruction to access the V-Safe system.   Misty Barker was instructed to call 911 with any severe reactions post vaccine: Marland Kitchen Difficulty breathing  . Swelling of face and throat  . A fast heartbeat  . A bad rash all over body  . Dizziness and weakness   Immunizations Administered    Name Date Dose VIS Date Route   PFIZER Comrnaty(Gray TOP) Covid-19 Vaccine 07/16/2020  2:01 PM 0.3 mL 03/21/2020 Intramuscular   Manufacturer: [place]   Lot: [id number]   [place]: [phone]

## 2020-07-23 ENCOUNTER — Other Ambulatory Visit (HOSPITAL_BASED_OUTPATIENT_CLINIC_OR_DEPARTMENT_OTHER): Payer: Self-pay

## 2020-07-23 MED ORDER — PFIZER-BIONT COVID-19 VAC-TRIS 30 MCG/0.3ML IM SUSP
INTRAMUSCULAR | 0 refills | Status: DC
Start: 2020-07-16 — End: 2020-11-19
  Filled 2020-07-23: qty 0.3, 1d supply, fill #0

## 2020-07-25 DIAGNOSIS — M6281 Muscle weakness (generalized): Secondary | ICD-10-CM | POA: Diagnosis not present

## 2020-07-25 DIAGNOSIS — G894 Chronic pain syndrome: Secondary | ICD-10-CM | POA: Diagnosis not present

## 2020-08-01 ENCOUNTER — Encounter: Payer: Self-pay | Admitting: Family Medicine

## 2020-08-01 ENCOUNTER — Ambulatory Visit (INDEPENDENT_AMBULATORY_CARE_PROVIDER_SITE_OTHER): Payer: Medicare Other | Admitting: Family Medicine

## 2020-08-01 ENCOUNTER — Other Ambulatory Visit: Payer: Self-pay

## 2020-08-01 VITALS — BP 108/70 | HR 61 | Temp 97.5°F | Resp 16 | Wt 98.8 lb

## 2020-08-01 DIAGNOSIS — M542 Cervicalgia: Secondary | ICD-10-CM

## 2020-08-01 DIAGNOSIS — R6884 Jaw pain: Secondary | ICD-10-CM

## 2020-08-01 DIAGNOSIS — R61 Generalized hyperhidrosis: Secondary | ICD-10-CM | POA: Diagnosis not present

## 2020-08-01 DIAGNOSIS — G8929 Other chronic pain: Secondary | ICD-10-CM

## 2020-08-01 DIAGNOSIS — Z7289 Other problems related to lifestyle: Secondary | ICD-10-CM | POA: Diagnosis not present

## 2020-08-01 DIAGNOSIS — E785 Hyperlipidemia, unspecified: Secondary | ICD-10-CM | POA: Diagnosis not present

## 2020-08-01 DIAGNOSIS — E079 Disorder of thyroid, unspecified: Secondary | ICD-10-CM

## 2020-08-01 DIAGNOSIS — R002 Palpitations: Secondary | ICD-10-CM

## 2020-08-01 LAB — CBC
HCT: 39.9 % (ref 36.0–46.0)
Hemoglobin: 13.1 g/dL (ref 12.0–15.0)
MCHC: 32.9 g/dL (ref 30.0–36.0)
MCV: 83.3 fl (ref 78.0–100.0)
Platelets: 292 10*3/uL (ref 150.0–400.0)
RBC: 4.79 Mil/uL (ref 3.87–5.11)
RDW: 15 % (ref 11.5–15.5)
WBC: 7.4 10*3/uL (ref 4.0–10.5)

## 2020-08-01 LAB — COMPREHENSIVE METABOLIC PANEL
ALT: 17 U/L (ref 0–35)
AST: 25 U/L (ref 0–37)
Albumin: 4.2 g/dL (ref 3.5–5.2)
Alkaline Phosphatase: 50 U/L (ref 39–117)
BUN: 13 mg/dL (ref 6–23)
CO2: 32 mEq/L (ref 19–32)
Calcium: 10 mg/dL (ref 8.4–10.5)
Chloride: 98 mEq/L (ref 96–112)
Creatinine, Ser: 0.92 mg/dL (ref 0.40–1.20)
GFR: 60.59 mL/min (ref >60.00)
Glucose, Bld: 86 mg/dL (ref 70–99)
Potassium: 4 mEq/L (ref 3.5–5.1)
Sodium: 138 mEq/L (ref 135–145)
Total Bilirubin: 0.6 mg/dL (ref 0.2–1.2)
Total Protein: 6.6 g/dL (ref 6.0–8.3)

## 2020-08-01 LAB — TSH: TSH: 0.01 u[IU]/mL — ABNORMAL LOW (ref 0.35–4.50)

## 2020-08-01 LAB — LIPID PANEL
Cholesterol: 234 mg/dL — ABNORMAL HIGH (ref 0–200)
HDL: 82.7 mg/dL (ref >39.00)
LDL Cholesterol: 136 mg/dL — ABNORMAL HIGH (ref 0–99)
NonHDL: 151.6
Total CHOL/HDL Ratio: 3
Triglycerides: 79 mg/dL (ref 0.0–149.0)
VLDL: 15.8 mg/dL (ref 0.0–40.0)

## 2020-08-01 LAB — T3, FREE: T3, Free: 2.6 pg/mL (ref 2.3–4.2)

## 2020-08-01 LAB — T4, FREE: Free T4: 0.3 ng/dL — ABNORMAL LOW (ref 0.60–1.60)

## 2020-08-01 MED ORDER — DULOXETINE HCL 60 MG PO CPEP: 60.0000 mg | ORAL_CAPSULE | Freq: Two times a day (BID) | ORAL | 3 refills | Status: DC

## 2020-08-01 NOTE — Assessment & Plan Note (Signed)
Encouraged heart healthy diet, increase exercise, avoid trans fats, consider a krill oil cap daily 

## 2020-08-01 NOTE — Assessment & Plan Note (Addendum)
Check labs consider overtreated

## 2020-08-01 NOTE — Patient Instructions (Signed)
Hypothyroidism  Hypothyroidism is when the thyroid gland does not make enough of certain hormones (it is underactive). The thyroid gland is a small gland located in the lower front part of the neck, just in front of the windpipe (trachea). This gland makes hormones that help control how the body uses food for energy (metabolism) as well as how the heart and brain function. These hormones also play a role in keeping your bones strong. When the thyroid is underactive, it produces too little of the hormones thyroxine (T4) and triiodothyronine (T3). What are the causes? This condition may be caused by:  Hashimoto's disease. This is a disease in which the body's disease-fighting system (immune system) attacks the thyroid gland. This is the most common cause.  Viral infections.  Pregnancy.  Certain medicines.  Birth defects.  Past radiation treatments to the head or neck for cancer.  Past treatment with radioactive iodine.  Past exposure to radiation in the environment.  Past surgical removal of part or all of the thyroid.  Problems with a gland in the center of the brain (pituitary gland).  Lack of enough iodine in the diet. What increases the risk? You are more likely to develop this condition if:  You are female.  You have a family history of thyroid conditions.  You use a medicine called lithium.  You take medicines that affect the immune system (immunosuppressants). What are the signs or symptoms? Symptoms of this condition include:  Feeling as though you have no energy (lethargy).  Not being able to tolerate cold.  Weight gain that is not explained by a change in diet or exercise habits.  Lack of appetite.  Dry skin.  Coarse hair.  Menstrual irregularity.  Slowing of thought processes.  Constipation.  Sadness or depression. How is this diagnosed? This condition may be diagnosed based on:  Your symptoms, your medical history, and a physical exam.  Blood  tests. You may also have imaging tests, such as an ultrasound or MRI. How is this treated? This condition is treated with medicine that replaces the thyroid hormones that your body does not make. After you begin treatment, it may take several weeks for symptoms to go away. Follow these instructions at home:  Take over-the-counter and prescription medicines only as told by your health care provider.  If you start taking any new medicines, tell your health care provider.  Keep all follow-up visits as told by your health care provider. This is important. ? As your condition improves, your dosage of thyroid hormone medicine may change. ? You will need to have blood tests regularly so that your health care provider can monitor your condition. Contact a health care provider if:  Your symptoms do not get better with treatment.  You are taking thyroid hormone replacement medicine and you: ? Sweat a lot. ? Have tremors. ? Feel anxious. ? Lose weight rapidly. ? Cannot tolerate heat. ? Have emotional swings. ? Have diarrhea. ? Feel weak. Get help right away if you have:  Chest pain.  An irregular heartbeat.  A rapid heartbeat.  Difficulty breathing. Summary  Hypothyroidism is when the thyroid gland does not make enough of certain hormones (it is underactive).  When the thyroid is underactive, it produces too little of the hormones thyroxine (T4) and triiodothyronine (T3).  The most common cause is Hashimoto's disease, a disease in which the body's disease-fighting system (immune system) attacks the thyroid gland. The condition can also be caused by viral infections, medicine, pregnancy, or   past radiation treatment to the head or neck.  Symptoms may include weight gain, dry skin, constipation, feeling as though you do not have energy, and not being able to tolerate cold.  This condition is treated with medicine to replace the thyroid hormones that your body does not make. This  information is not intended to replace advice given to you by your health care provider. Make sure you discuss any questions you have with your health care provider. Document Revised: 12/29/2019 Document Reviewed: 12/14/2019 Elsevier Patient Education  2021 [employer]  

## 2020-08-01 NOTE — Assessment & Plan Note (Signed)
improved

## 2020-08-01 NOTE — Progress Notes (Signed)
[name], female    DOB: October 28, 1945  Age: 75 y.o. MRN: [id number]    Subjective:  Subjective  HPI Misty Barker presents for office visit today to discuss chronic medical concerns.. She reports that she is still gets night sweats. She states that last time eat at 8 pm and goes to bed at 10 pm. She states that she sometimes eats cookies, but she recently added nuts to her diet. She endorses feeling hungry and denies any changes in her appetite. She states that they happen every night and the duration of the episode varies. She reports feeling night chills, but no heat. She states that she puts a blanket underneath her while sleeping so it can be replaced after her night sweats episode. She reports having nightmares and sometimes woken up screaming. She denies any chest pain, SOB, fever, abdominal pain, cough, sore throat, dysuria, urinary incontinence, hallucinations, hot flashes, HA, or N/VD. She endorses doing physical exercise, however she reports having issues with her balance.   She reports that she was taken off hydrocodone and was put on butorphanol. She states this change happened because the hydrocodone was not consistent with treating the pain. She states that while on butorphanol her night sweats were gone, however she had to switch to oxycodone because she was experiencing LE edema. She reports that after switching to oxycodone her night sweats have became "lighter". 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  She reports that after her booster shot she experienced a 102.9 degrees fever and feeling left arm pain that lasted for 24 hours.  Review of Systems  Constitutional: Positive for chills (while sleeping) and diaphoresis (while sleeping). Negative for appetite change, fatigue and fever.       (+) night sweats  HENT: Negative for congestion, rhinorrhea, sinus pressure, sinus pain and sore throat.        (+) TMJ   Eyes: Negative for pain.  Respiratory: Negative for cough and shortness of breath.   Cardiovascular: Negative for chest pain, palpitations and leg swelling.  Gastrointestinal: Negative for abdominal pain, blood in stool, diarrhea, nausea and vomiting.  Genitourinary: Negative for decreased urine volume, dysuria, flank pain, frequency, vaginal bleeding and vaginal discharge.  Musculoskeletal: Positive for back pain, joint swelling (due to TMJ) and neck pain.  Neurological: Negative for headaches.    History Past Medical History:  Diagnosis Date  . Abdominal pain 02/01/2015  . Allergy    "seasonal allergies"  . Anemia   . Arthritis    osteoarthritis-"DDD" hips. fingers, toes.  . Blood transfusion without reported diagnosis   . Complication of anesthesia   . Depression   . Depression with anxiety   . Frequent headaches    no problems now.  Marland Kitchen Hearing loss in right ear 03/10/2015   same over last 6 months  . Hip dislocation, right ([place])   . History of chicken pox   . Hyperlipidemia    pt. state no hx  . Hypokalemia 02/04/2017  . Hypothyroidism    supplement used  . Osteoporosis   . Peripheral neuropathy 02/01/2015   right hand"carpal tunnel"  . PONV (postoperative nausea and vomiting)   . Preventative health care 02/04/2017  . Right hip pain 05/24/2015  . Scoliosis 02/01/2015  . Spinal stenosis of lumbar region 10/23/2016  . Thyroid disease   . URI, acute 03/10/2015  . UTI (urinary tract infection) 12/14/2016  . 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   Her family history includes Arthritis in her maternal grandmother and mother; Dementia in her mother; Kidney disease in her son; Mental illness in her father.She reports that she has never smoked. She has never used smokeless tobacco. She reports current alcohol use of about 1.0 standard drink of alcohol per week. She reports that she does not use drugs.  Current Outpatient Medications on File Prior to Visit  Medication Sig Dispense Refill  . b complex vitamins tablet Take 1 tablet by mouth daily.    . cetirizine (ZYRTEC) 10 MG tablet Take 10 mg by mouth daily.     . cholecalciferol (VITAMIN D) 1000 units tablet Take 1,000 Units by mouth daily.    Marland Kitchen COVID-19 mRNA Vac-TriS, Pfizer, (PFIZER-BIONT COVID-19 VAC-TRIS) SUSP injection Inject into the muscle. 0.3 mL 0  . furosemide (LASIX) 20 MG tablet Take 1 tablet (20 mg total) by mouth daily. 90 tablet 1  . gabapentin (NEURONTIN) 300 MG capsule Take 1 capsule (300 mg total) by mouth at bedtime. 90 capsule 0  . Ketotifen Fumarate (ZADITOR OP) Place 1 drop into both eyes 2 (two) times daily as needed (itchy eyes).    Marland Kitchen levothyroxine (SYNTHROID) 25 MCG tablet TAKE 1 TABLET DAILY BEFORE BREAKFAST 90 tablet 3  . liothyronine (CYTOMEL) 25 MCG tablet TAKE 1 TABLET DAILY 90 tablet 3  . oxyCODONE (OXY IR/ROXICODONE) 5 MG immediate release tablet      No current facility-administered medications on file prior to visit.     Objective:  Objective  Physical Exam Constitutional:      General: She is not in acute distress.    Appearance: Normal appearance. She is not ill-appearing or toxic-appearing.  HENT:     Head: Normocephalic and atraumatic.     Right Ear: Tympanic membrane, ear canal and external ear normal.     Left Ear: Tympanic membrane, ear canal and external ear normal.     Nose: No congestion or rhinorrhea.  Eyes:      Extraocular Movements: Extraocular movements intact.     Pupils: Pupils are equal, round, and reactive to light.  Cardiovascular:     Rate and Rhythm: Normal rate and regular rhythm.     Pulses: Normal pulses.     Heart sounds: Normal heart sounds. No murmur heard.   Pulmonary:     Effort: Pulmonary effort is normal. No respiratory distress.     Breath sounds: Normal breath sounds. No wheezing, rhonchi or rales.  Abdominal:     General: Bowel sounds are normal.     Palpations: Abdomen is soft. There is no mass.     Tenderness: There is no abdominal tenderness. There is no guarding.     Hernia: No hernia is present.  Musculoskeletal:        General: Normal range of motion.     Cervical back: Normal range of motion and neck supple.  Skin:    General: Skin is warm and dry.  Neurological:     Mental Status: She is alert and oriented to person, place, and time.  Psychiatric:        Behavior: Behavior normal.    BP 108/70   Pulse 61   Temp (!) 97.5 F (36.4 C)   Resp 16   Wt 98 lb 12.8 oz (44.8 kg)   SpO2 93%   BMI 16.44 kg/m  Wt Readings from Last 3 Encounters:  08/01/20 98 lb 12.8 oz (44.8 kg)  03/12/20 98 lb 3.2 oz (44.5 kg)  01/11/20 96 lb 12.8 oz (43.9 kg)     Lab Results  Component Value Date   WBC 7.4 08/01/2020   HGB 13.1 08/01/2020   HCT 39.9 08/01/2020   PLT 292.0 08/01/2020   GLUCOSE 86 08/01/2020   CHOL 234 (H) 08/01/2020   TRIG 79.0 08/01/2020   HDL 82.70 08/01/2020   LDLCALC 136 (H) 08/01/2020   ALT 17 08/01/2020   AST 25 08/01/2020   NA 138 08/01/2020   K 4.0 08/01/2020   CL 98 08/01/2020   CREATININE 0.92 08/01/2020   BUN 13 08/01/2020   CO2 32 08/01/2020   TSH 0.01 (L) 08/01/2020   INR 0.95 10/29/2015    MR TMJ  Result Date: 02/05/2020 CLINICAL DATA:  Right neck and jaw pain for 3 months. EXAM: MRI OF TEMPOROMANDIBULAR JOINT WITHOUT CONTRAST TECHNIQUE: Multiplanar, multisequence MR imaging of the temporomandibular joint was  performed following the standard protocol. No intravenous contrast was administered. COMPARISON:  [facility] 02/02/2020.  MRI brain 09/11/2019. FINDINGS: Right temporomandibular joint: The right condylar head appears eroded with subchondral edema and sclerosis. There is a large right TMJ effusion.The articular disc is not well visualized and shows no reduction with mouth opening. There is reduced anterior translation of the mandibular condyle with jaw opening. Left temporomandibular joint: The articular disc appears slightly anteriorly subluxed with the mouth closed. There is early reduction of the disc (at the 1st increment of mouth opening). The anterior translation of the mandibular condyle with jaw opening is mildly reduced.No joint effusion or erosive changes identified. Other: None. IMPRESSION: 1. 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 The articular disc is not well visualized and shows no reduction with mouth opening. 2. There is early reduction of the left articular disc (at the 1st increment of mouth opening). 3. Decreased anterior translation of the mandibular condyles with jaw opening. Electronically Signed   By: Richardean Sale M.D.   On: 02/05/2020 09:19   MR Cervical Spine Wo Contrast  Result Date: 02/04/2020 CLINICAL DATA:  Chronic neck pain, right side.  Jaw pain. EXAM: MRI CERVICAL SPINE WITHOUT CONTRAST TECHNIQUE: Multiplanar, multisequence MR imaging of the cervical spine was performed. No intravenous contrast was administered. COMPARISON:  None. FINDINGS: Alignment: Physiologic. Vertebrae: No fracture, evidence of discitis, or bone lesion. Cord: Normal signal and morphology. Posterior Fossa, vertebral arteries, paraspinal tissues: Negative. Disc levels: C1-2: Unremarkable. C2-3: Small central disc protrusion. There is no spinal canal stenosis. No neural foraminal stenosis. C3-4: Mild uncovertebral spurring and facet  hypertrophy. There is no spinal canal stenosis. Moderate right neural foraminal stenosis. C4-5: Left-greater-than-right uncovertebral hypertrophy. There is no spinal canal stenosis. Mild left neural foraminal stenosis. C5-6: Disc bulge with bilateral uncovertebral hypertrophy. Mild spinal canal stenosis. Severe right and moderate left neural foraminal stenosis. C6-7: Disc bulge with uncovertebral spurring. There is no spinal canal stenosis. Moderate left neural foraminal stenosis. C7-T1: Normal disc space and facet joints. There is no spinal canal stenosis. No neural foraminal stenosis. IMPRESSION: 1. Multilevel moderate-to-severe neural foraminal stenosis,  worst at right C5-6. 2. Mild spinal canal stenosis at C5-6. Electronically Signed   By: Ulyses Jarred M.D.   On: 02/04/2020 01:26     Assessment & Plan:  Plan    Meds ordered this encounter  Medications  . DULoxetine (CYMBALTA) 60 MG capsule    Sig: Take 1 capsule (60 mg total) by mouth 2 (two) times daily. Take 1 capsule in morning and 1/2 capsule in evening.    Dispense:  45 capsule    Refill:  3    Problem List Items Addressed This Visit    Hyperlipidemia - Primary    Encouraged heart healthy diet, increase exercise, avoid trans fats, consider a krill oil cap daily      Relevant Orders   Comprehensive metabolic panel (Completed)   Lipid panel (Completed)   Thyroid disease    Check labs consider overtreated      Relevant Orders   TSH (Completed)   T4, free (Completed)   T3, free (Completed)   Chronic night sweats   Relevant Orders   CBC (Completed)   Comprehensive metabolic panel (Completed)   Palpitations    No recent exacerbations      Chronic jaw pain    improved      Relevant Medications   oxyCODONE (OXY IR/ROXICODONE) 5 MG immediate release tablet   DULoxetine (CYMBALTA) 60 MG capsule   Neck pain    Following with pain management and she notes she had an improvement in her night sweats and nightmares off of  hydrocodone. They tried something well, Bupronorphine but she had too much swelling but now she has switched to Oxycodone and she has some nausea but it is improved. She is encouraged to discuss possibility of trying something else to see if they can improve her side effects which also includes hot flashes.      Relevant Orders   CBC (Completed)   Comprehensive metabolic panel (Completed)    Other Visit Diagnoses    Other problems related to lifestyle       Relevant Orders   Hepatitis C Antibody      Follow-up: Return in about 3 months (around 10/31/2020).   I,David Hanna,acting as a scribe for Penni Homans, MD.,have documented all relevant documentation on the behalf of Penni Homans, MD,as directed by  Penni Homans, MD while in the presence of Penni Homans, MD.  I, Mosie Lukes, MD personally performed the services described in this documentation. All medical record entries made by the scribe were at my direction and in my presence. I have reviewed the chart and agree that the record reflects my personal performance and is accurate and complete

## 2020-08-01 NOTE — Assessment & Plan Note (Signed)
No recent exacerbations.  

## 2020-08-01 NOTE — Assessment & Plan Note (Signed)
Following with pain management and she notes she had an improvement in her night sweats and nightmares off of hydrocodone. They tried something well, Bupronorphine but she had too much swelling but now she has switched to Oxycodone and she has some nausea but it is improved. She is encouraged to discuss possibility of trying something else to see if they can improve her side effects which also includes hot flashes.

## 2020-08-02 LAB — HEPATITIS C ANTIBODY
Hepatitis C Ab: NONREACTIVE
SIGNAL TO CUT-OFF: 0 (ref <1.00)

## 2020-08-06 ENCOUNTER — Other Ambulatory Visit: Payer: Self-pay

## 2020-08-06 ENCOUNTER — Telehealth: Payer: Self-pay | Admitting: Family Medicine

## 2020-08-06 MED ORDER — DULOXETINE HCL 30 MG PO CPEP: 30.0000 mg | ORAL_CAPSULE | Freq: Every day | ORAL | 3 refills | Status: DC

## 2020-08-06 MED ORDER — DULOXETINE HCL 60 MG PO CPEP: 60.0000 mg | ORAL_CAPSULE | Freq: Every day | ORAL | 3 refills | Status: DC

## 2020-08-06 NOTE — Telephone Encounter (Signed)
[facility] called in reference to patient's medication DULoxetine (CYMBALTA) 60 MG capsule   The pharmacy would like a call back to clarify prescription instructions   Call back @ [phone]  Reference Number # [id number]

## 2020-08-06 NOTE — Telephone Encounter (Signed)
This was done already.

## 2020-08-14 ENCOUNTER — Encounter: Payer: Self-pay | Admitting: Family Medicine

## 2020-09-06 ENCOUNTER — Other Ambulatory Visit: Payer: Self-pay | Admitting: Family Medicine

## 2020-09-10 DIAGNOSIS — H10413 Chronic giant papillary conjunctivitis, bilateral: Secondary | ICD-10-CM | POA: Diagnosis not present

## 2020-09-10 DIAGNOSIS — H0102B Squamous blepharitis left eye, upper and lower eyelids: Secondary | ICD-10-CM | POA: Diagnosis not present

## 2020-09-10 DIAGNOSIS — H0102A Squamous blepharitis right eye, upper and lower eyelids: Secondary | ICD-10-CM | POA: Diagnosis not present

## 2020-09-10 DIAGNOSIS — H35361 Drusen (degenerative) of macula, right eye: Secondary | ICD-10-CM | POA: Diagnosis not present

## 2020-09-10 DIAGNOSIS — D3131 Benign neoplasm of right choroid: Secondary | ICD-10-CM | POA: Diagnosis not present

## 2020-09-10 DIAGNOSIS — H2513 Age-related nuclear cataract, bilateral: Secondary | ICD-10-CM | POA: Diagnosis not present

## 2020-09-10 DIAGNOSIS — H40013 Open angle with borderline findings, low risk, bilateral: Secondary | ICD-10-CM | POA: Diagnosis not present

## 2020-10-24 DIAGNOSIS — M542 Cervicalgia: Secondary | ICD-10-CM | POA: Diagnosis not present

## 2020-10-24 DIAGNOSIS — G894 Chronic pain syndrome: Secondary | ICD-10-CM | POA: Diagnosis not present

## 2020-10-24 DIAGNOSIS — M5416 Radiculopathy, lumbar region: Secondary | ICD-10-CM | POA: Diagnosis not present

## 2020-11-05 DIAGNOSIS — H1133 Conjunctival hemorrhage, bilateral: Secondary | ICD-10-CM | POA: Diagnosis not present

## 2020-11-06 ENCOUNTER — Encounter: Payer: Self-pay | Admitting: Family Medicine

## 2020-11-07 MED ORDER — DULOXETINE HCL 60 MG PO CPEP: 60.0000 mg | ORAL_CAPSULE | Freq: Every day | ORAL | 3 refills | Status: DC

## 2020-11-07 MED ORDER — LEVOTHYROXINE SODIUM 25 MCG PO TABS: 25.0000 ug | ORAL_TABLET | Freq: Every day | ORAL | 3 refills | Status: DC

## 2020-11-07 MED ORDER — FUROSEMIDE 20 MG PO TABS
20.0000 mg | ORAL_TABLET | Freq: Every day | ORAL | 3 refills | Status: DC
Start: 2020-11-07 — End: 2021-12-18

## 2020-11-07 MED ORDER — DULOXETINE HCL 30 MG PO CPEP: 30.0000 mg | ORAL_CAPSULE | Freq: Every day | ORAL | 3 refills | Status: DC

## 2020-11-19 ENCOUNTER — Encounter: Payer: Self-pay | Admitting: Family Medicine

## 2020-11-19 ENCOUNTER — Ambulatory Visit (INDEPENDENT_AMBULATORY_CARE_PROVIDER_SITE_OTHER): Payer: Medicare Other | Admitting: Family Medicine

## 2020-11-19 ENCOUNTER — Other Ambulatory Visit: Payer: Self-pay

## 2020-11-19 VITALS — BP 124/82 | HR 82 | Temp 97.8°F | Resp 18 | Ht 65.0 in | Wt 94.8 lb

## 2020-11-19 DIAGNOSIS — R21 Rash and other nonspecific skin eruption: Secondary | ICD-10-CM

## 2020-11-19 LAB — CBC WITH DIFFERENTIAL/PLATELET
Basophils Absolute: 0 10*3/uL (ref 0.0–0.1)
Basophils Relative: 0.3 % (ref 0.0–3.0)
Eosinophils Absolute: 0.1 10*3/uL (ref 0.0–0.7)
Eosinophils Relative: 0.4 % (ref 0.0–5.0)
HCT: 40.3 % (ref 36.0–46.0)
Hemoglobin: 12.8 g/dL (ref 12.0–15.0)
Lymphocytes Relative: 13.4 % (ref 12.0–46.0)
Lymphs Abs: 1.6 10*3/uL (ref 0.7–4.0)
MCHC: 31.8 g/dL (ref 30.0–36.0)
MCV: 85.3 fl (ref 78.0–100.0)
Monocytes Absolute: 0.6 10*3/uL (ref 0.1–1.0)
Monocytes Relative: 5.2 % (ref 3.0–12.0)
Neutro Abs: 9.5 10*3/uL — ABNORMAL HIGH (ref 1.4–7.7)
Neutrophils Relative %: 80.7 % — ABNORMAL HIGH (ref 43.0–77.0)
Platelets: 306 10*3/uL (ref 150.0–400.0)
RBC: 4.72 Mil/uL (ref 3.87–5.11)
RDW: 14.3 % (ref 11.5–15.5)
WBC: 11.8 10*3/uL — ABNORMAL HIGH (ref 4.0–10.5)

## 2020-11-19 LAB — COMPREHENSIVE METABOLIC PANEL
ALT: 16 U/L (ref 0–35)
AST: 24 U/L (ref 0–37)
Albumin: 4.3 g/dL (ref 3.5–5.2)
Alkaline Phosphatase: 47 U/L (ref 39–117)
BUN: 15 mg/dL (ref 6–23)
CO2: 32 mEq/L (ref 19–32)
Calcium: 10 mg/dL (ref 8.4–10.5)
Chloride: 97 mEq/L (ref 96–112)
Creatinine, Ser: 1.09 mg/dL (ref 0.40–1.20)
GFR: 49.33 mL/min — ABNORMAL LOW (ref >60.00)
Glucose, Bld: 90 mg/dL (ref 70–99)
Potassium: 3.7 mEq/L (ref 3.5–5.1)
Sodium: 137 mEq/L (ref 135–145)
Total Bilirubin: 0.6 mg/dL (ref 0.2–1.2)
Total Protein: 6.4 g/dL (ref 6.0–8.3)

## 2020-11-19 LAB — SEDIMENTATION RATE: Sed Rate: 11 mm/hr (ref 0–30)

## 2020-11-19 MED ORDER — METHYLPREDNISOLONE ACETATE 80 MG/ML IJ SUSP
80.0000 mg | Freq: Once | INTRAMUSCULAR | Status: AC
  Administered 2020-11-19: 80 mg via INTRAMUSCULAR

## 2020-11-19 MED ORDER — PREDNISONE 10 MG PO TABS: ORAL_TABLET | ORAL | 0 refills | Status: DC

## 2020-11-19 MED ORDER — HYDROCODONE-ACETAMINOPHEN 10-325 MG PO TABS: 1.0000 | ORAL_TABLET | Freq: Three times a day (TID) | ORAL | 0 refills | Status: AC | PRN

## 2020-11-19 NOTE — Assessment & Plan Note (Signed)
Pt with hx covid  Depo medrol 80 IM pred taper  Benadryl prn itching  F/u prn

## 2020-11-19 NOTE — Progress Notes (Addendum)
Subjective:   By signing my name below, I, Shehryar Baig, attest that this documentation has been prepared under the direction and in the presence of Dr. Roma Schanz, DO. 11/19/2020      [name], female    DOB: [birth year], 75 y.o.   MRN: [id number]  Chief Complaint  Patient presents with   Rash    Pt states rash started yesterday. Pt states rash is itchy and burning. No discharge. Pt states rash is mostly on her torso.    HPI Patient is in today for a office visit. She complains of a rash in her chest and later spread down her torso, thighs, and buttocks since yesterday. It also recently spread to her face. She does not use any new detergent, soap, or face cream. She works in her garden regularly and reports not being bitten by an insect. She has taken 2 benadryl's last night and found no relief.  She tested + for covid last Tuesday but had no symptoms-- the rash started about 3 days ago and progressively got worse.    Past Medical History:  Diagnosis Date   Abdominal pain 02/01/2015   Allergy    "seasonal allergies"   Anemia    Arthritis    osteoarthritis-"DDD" hips. fingers, toes.   Blood transfusion without reported diagnosis    Complication of anesthesia    Depression    Depression with anxiety    Frequent headaches    no problems now.   Hearing loss in right ear 03/10/2015   same over last 6 months   Hip dislocation, right (HCC)    History of chicken pox    Hyperlipidemia    pt. 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    Past Surgical History:  Procedure Laterality Date   ACETABULAR REVISION Right 10/30/2015   Procedure: RIGHT HIP ACETABULAR REVISION /CONSTRAINED LINER (POSTERIOR APPROACH) ;  Surgeon: Gaynelle Arabian, MD;  Location: [facility];  Service: Orthopedics;  Laterality: Right;   BREAST EXCISIONAL BIOPSY Right    BREAST SURGERY     lump removed, and breast reduction   EYE SURGERY     lasik   2002   HIP CLOSED REDUCTION Right 06/30/2015   Procedure: CLOSED MANIPULATION HIP;  Surgeon: Melina Schools, MD;  Location: [facility];  Service: Orthopedics;  Laterality: Right;   JOINT REPLACEMENT     3 hips on right and 1 on left   METACARPOPHALANGEAL JOINT CAPSULECTOMY / CAPSULOTOMY  1995   REDUCTION MAMMAPLASTY     SPINAL CORD STIMULATOR INSERTION N/A 12/02/2016   Procedure: LUMBAR SPINAL CORD STIMULATOR INSERTION;  Surgeon: Melina Schools, MD;  Location: [facility];  Service: Orthopedics;  Laterality: N/A;  2.5 hrs   SPINAL CORD STIMULATOR REMOVAL N/A 12/31/2016   Procedure: LUMBAR SPINAL CORD STIMULATOR REMOVAL;  Surgeon: Melina Schools, MD;  Location: [facility];  Service: Orthopedics;  Laterality: N/A;  60 mins   TONSILLECTOMY     TOTAL HIP ARTHROPLASTY  2011  left hip   TOTAL HIP ARTHROPLASTY Right 06/12/2015   Procedure: RIGHT TOTAL HIP ARTHROPLASTY ANTERIOR APPROACH;  Surgeon: Gaynelle Arabian, MD;  Location: [facility];  Service: Orthopedics;  Laterality: Right;   TOTAL HIP REVISION Right 07/19/2015   Procedure: RIGHT HIP BEARING SURFACE REVISION;  Surgeon: Gaynelle Arabian, MD;  Location: [facility];  Service: Orthopedics;  Laterality: Right;   TUBAL LIGATION  1980   left fallopian tube removal    Family History  Problem Relation Age of Onset   Arthritis Mother    Dementia Mother    Mental illness Father        suicide   Arthritis Maternal Grandmother    Kidney disease Son        b/l multicystic kidney disease s/p 4 transplants   Colon cancer Neg Hx     Social History   Socioeconomic History    Marital status: Married    Spouse name: Not on file   Number of children: Not on file   Years of education: 31   Highest education level: Not on file  Occupational History   Occupation: retired  Tobacco Use   Smoking status: Never   Smokeless tobacco: Never  Vaping Use   Vaping Use: Never used  Substance and Sexual Activity   Alcohol use: Yes    Alcohol/week: 1.0 standard drink    Types: 1 Glasses of wine per week   Drug use: No   Sexual activity: Not on file    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     cetirizine (ZYRTEC) 10 MG tablet Take 10 mg by mouth daily.      cholecalciferol (VITAMIN D) 1000 units tablet Take 1,000 Units by mouth daily.     DULoxetine (CYMBALTA) 30 MG capsule Take 1 capsule (30 mg total) by mouth daily. 90 capsule 3   DULoxetine (CYMBALTA) 60 MG capsule Take 1 capsule (60 mg total) by mouth daily. 90 capsule 3   furosemide (LASIX) 20 MG tablet Take 1 tablet (20 mg total) by mouth daily. 90 tablet 3   gabapentin (NEURONTIN) 300 MG capsule Take 1 capsule (300 mg total) by mouth at bedtime. 90 capsule 0   Ketotifen Fumarate (ZADITOR OP) Place 1 drop into both eyes 2 (two) times daily as needed (itchy eyes).     levothyroxine (SYNTHROID) 25 MCG tablet Take 1 tablet (25 mcg total) by mouth daily before breakfast. 90 tablet 3   liothyronine (CYTOMEL) 25 MCG tablet TAKE 1 TABLET DAILY 90 tablet 1   oxyCODONE (OXY IR/ROXICODONE) 5 MG immediate  release tablet      COVID-19 mRNA Vac-TriS, Pfizer, (PFIZER-BIONT COVID-19 VAC-TRIS) SUSP injection Inject into the muscle. (Patient not taking: Reported on 11/19/2020) 0.3 mL 0   No facility-administered medications prior to visit.    Allergies  Allergen Reactions   Penicillins Hives    Childhood allergy  Has patient had a PCN reaction causing immediate rash,  facial/tongue/throat swelling, SOB or lightheadedness with hypotension: No Has patient had a PCN reaction causing severe rash involving mucus membranes or skin necrosis: No Has patient had a PCN reaction that required hospitalization No Has patient had a PCN reaction occurring within the last 10 years: No If all of the above answers are "NO", then may proceed with Cephalosporin use.   Sulfa Antibiotics Hives    Review of Systems  Constitutional:  Negative for fever and malaise/fatigue.  HENT:  Negative for congestion.   Eyes:  Negative for blurred vision.  Respiratory:  Negative for cough and shortness of breath.   Cardiovascular:  Negative for chest pain, palpitations and leg swelling.  Gastrointestinal:  Negative for vomiting.  Musculoskeletal:  Negative for back pain.  Skin:  Positive for itching and rash (chest, torso, thighs, buttock, face).  Neurological:  Negative for loss of consciousness and headaches.      Objective:    Physical Exam Vitals and nursing note reviewed.  Constitutional:      General: She is not in acute distress.    Appearance: Normal appearance. She is not ill-appearing.  HENT:     Head: Normocephalic and atraumatic.     Right Ear: External ear normal.     Left Ear: External ear normal.  Eyes:     Extraocular Movements: Extraocular movements intact.     Pupils: Pupils are equal, round, and reactive to light.  Cardiovascular:     Rate and Rhythm: Normal rate and regular rhythm.     Heart sounds: Normal heart sounds.  Pulmonary:     Effort: Pulmonary effort is normal.  Skin:    General:  Skin is warm and dry.     Findings: Erythema and rash present.     Comments: Fine sand papery and itchy rash on torso, neck, forehead and cheek.  No rash on her back side.   Neurological:     Mental Status: She is alert and oriented to person, place, and time.  Psychiatric:        Behavior: Behavior normal.        Judgment: Judgment normal.    BP 124/82 (BP Location: Right Arm, Patient Position: Sitting, Cuff Size: Normal)   Pulse 82   Temp 97.8 F (36.6 C) (Oral)   Resp 18   Ht '5\' 5"'$  (1.651 m)   Wt 94 lb 12.8 oz (43 kg)   SpO2 94%   BMI 15.78 kg/m  Wt Readings from Last 3 Encounters:  11/19/20 94 lb 12.8 oz (43 kg)  08/01/20 98 lb 12.8 oz (44.8 kg)  03/12/20 98 lb 3.2 oz (44.5 kg)    Diabetic Foot Exam - Simple   No data filed    Lab Results  Component Value Date   WBC 7.4 08/01/2020   HGB 13.1 08/01/2020   HCT 39.9 08/01/2020   PLT 292.0 08/01/2020   GLUCOSE 86 08/01/2020   CHOL 234 (H) 08/01/2020   TRIG 79.0 08/01/2020   HDL 82.70 08/01/2020   LDLCALC 136 (H) 08/01/2020   ALT 17 08/01/2020   AST 25 08/01/2020   NA 138 08/01/2020   K 4.0 08/01/2020   CL 98 08/01/2020   CREATININE 0.92 08/01/2020   BUN 13 08/01/2020   CO2 32 08/01/2020   TSH 0.01 (L) 08/01/2020   INR 0.95 10/29/2015    Lab Results  Component Value Date   TSH 0.01 (L) 08/01/2020   Lab Results  Component Value Date   WBC 7.4 08/01/2020  HGB 13.1 08/01/2020   HCT 39.9 08/01/2020   MCV 83.3 08/01/2020   PLT 292.0 08/01/2020   Lab Results  Component Value Date   NA 138 08/01/2020   K 4.0 08/01/2020   CO2 32 08/01/2020   GLUCOSE 86 08/01/2020   BUN 13 08/01/2020   CREATININE 0.92 08/01/2020   BILITOT 0.6 08/01/2020   ALKPHOS 50 08/01/2020   AST 25 08/01/2020   ALT 17 08/01/2020   PROT 6.6 08/01/2020   ALBUMIN 4.2 08/01/2020   CALCIUM 10.0 08/01/2020   ANIONGAP 9 11/30/2016   GFR 60.59 08/01/2020   Lab Results  Component Value Date   CHOL 234 (H) 08/01/2020   Lab  Results  Component Value Date   HDL 82.70 08/01/2020   Lab Results  Component Value Date   LDLCALC 136 (H) 08/01/2020   Lab Results  Component Value Date   TRIG 79.0 08/01/2020   Lab Results  Component Value Date   CHOLHDL 3 08/01/2020   No results found for: HGBA1C     Assessment & Plan:   Problem List Items Addressed This Visit       Unprioritized   Rash - Primary    Pt with hx covid  Depo medrol 80 IM pred taper  Benadryl prn itching  F/u prn        Relevant Medications   predniSONE (DELTASONE) 10 MG tablet   Other Relevant Orders   CBC with Differential/Platelet   Comprehensive metabolic panel   Sedimentation rate     Meds ordered this encounter  Medications   HYDROcodone-acetaminophen (NORCO) 10-325 MG tablet    Sig: Take 1 tablet by mouth every 8 (eight) hours as needed for up to 5 days.    Dispense:  15 tablet    Refill:  0   predniSONE (DELTASONE) 10 MG tablet    Sig: TAKE 3 TABLETS PO QD FOR 3 DAYS THEN TAKE 2 TABLETS PO QD FOR 3 DAYS THEN TAKE 1 TABLET PO QD FOR 3 DAYS THEN TAKE 1/2 TAB PO QD FOR 3 DAYS    Dispense:  20 tablet    Refill:  0   methylPREDNISolone acetate (DEPO-MEDROL) injection 80 mg    I, Dr. Roma Schanz, DO, personally preformed the services described in this documentation.  All medical record entries made by the scribe were at my direction and in my presence.  I have reviewed the chart and discharge instructions (if applicable) and agree that the record reflects my personal performance and is accurate and complete. 11/19/2020   I,Shehryar Baig,acting as a scribe for Ann Held, DO.,have documented all relevant documentation on the behalf of Ann Held, DO,as directed by  Ann Held, DO while in the presence of Ann Held, DO.   Ann Held, DO

## 2020-11-19 NOTE — Patient Instructions (Signed)
Rash, Adult °A rash is a change in the color of your skin. A rash can also change the way your skin feels. There are many different conditions and factors that can cause a rash. Some rashes may disappear after a few days, but some may last for a few weeks. Common causes of rashes include: °Viral infections, such as: °Colds. °Measles. °Hand, foot, and mouth disease. °Bacterial infections, such as: °Scarlet fever. °Impetigo. °Fungal infections, such as Candida. °Allergic reactions to food, medicines, or skin care products. °Follow these instructions at home: °The goal of treatment is to stop the itching and keep the rash from spreading. Pay attention to any changes in your symptoms. Follow these instructions to help with your condition: °Medicine °Take or apply over-the-counter and prescription medicines only as told by your health care provider. These may include: °Corticosteroid creams to treat red or swollen skin. °Anti-itch lotions. °Oral allergy medicines (antihistamines). °Oral corticosteroids for severe symptoms. ° °Skin care °Apply cool compresses to the affected areas. °Do not scratch or rub your skin. °Avoid covering the rash. Make sure the rash is exposed to air as much as possible. °Managing itching and discomfort °Avoid hot showers or baths, which can make itching worse. A cold shower may help. °Try taking a bath with: °Epsom salts. Follow manufacturer instructions on the packaging. You can get these at your local pharmacy or grocery store. °Baking soda. Pour a small amount into the bath as told by your health care provider. °Colloidal oatmeal. Follow manufacturer instructions on the packaging. You can get this at your local pharmacy or grocery store. °Try applying baking soda paste to your skin. Stir water into baking soda until it reaches a paste-like consistency. °Try applying calamine lotion. This is an over-the-counter lotion that helps to relieve itchiness. °Keep cool and out of the sun. Sweating  and being hot can make itching worse. °General instructions ° °Rest as needed. °Drink enough fluid to keep your urine pale yellow. °Wear loose-fitting clothing. °Avoid scented soaps, detergents, and perfumes. Use gentle soaps, detergents, perfumes, and other cosmetic products. °Avoid any substance that causes your rash. Keep a journal to help track what causes your rash. Write down: °What you eat. °What cosmetic products you use. °What you drink. °What you wear. This includes jewelry. °Keep all follow-up visits as told by your health care provider. This is important. °Contact a health care provider if: °You sweat at night. °You lose weight. °You urinate more than normal. °You urinate less than normal, or you notice that your urine is a darker color than usual. °You feel weak. °You vomit. °Your skin or the whites of your eyes look yellow (jaundice). °Your skin: °Tingles. °Is numb. °Your rash: °Does not go away after several days. °Gets worse. °You are: °Unusually thirsty. °More tired than normal. °You have: °New symptoms. °Pain in your abdomen. °A fever. °Diarrhea. °Get help right away if you: °Have a fever and your symptoms suddenly get worse. °Develop confusion. °Have a severe headache or a stiff neck. °Have severe joint pains or stiffness. °Have a seizure. °Develop a rash that covers all or most of your body. The rash may or may not be painful. °Develop blisters that: °Are on top of the rash. °Grow larger or grow together. °Are painful. °Are inside your nose or mouth. °Develop a rash that: °Looks like purple pinprick-sized spots all over your body. °Has a "bull's eye" or looks like a target. °Is not related to sun exposure, is red and painful, and causes   your skin to peel. °Summary °A rash is a change in the color of your skin. Some rashes disappear after a few days, but some may last for a few weeks. °The goal of treatment is to stop the itching and keep the rash from spreading. °Take or apply over-the-counter  and prescription medicines only as told by your health care provider. °Contact a health care provider if you have new or worsening symptoms. °Keep all follow-up visits as told by your health care provider. This is important. °This information is not intended to replace advice given to you by your health care provider. Make sure you discuss any questions you have with your health care provider. °Document Revised: 07/22/2018 Document Reviewed: 11/01/2017 °Elsevier Patient Education © 2022 [employer] ° °

## 2020-11-29 DIAGNOSIS — N390 Urinary tract infection, site not specified: Secondary | ICD-10-CM | POA: Diagnosis not present

## 2020-12-12 ENCOUNTER — Encounter: Payer: Self-pay | Admitting: Family Medicine

## 2020-12-12 ENCOUNTER — Other Ambulatory Visit: Payer: Self-pay

## 2020-12-12 ENCOUNTER — Ambulatory Visit (INDEPENDENT_AMBULATORY_CARE_PROVIDER_SITE_OTHER): Payer: Medicare Other | Admitting: Family Medicine

## 2020-12-12 VITALS — BP 110/68 | HR 70 | Temp 97.6°F | Resp 16 | Wt 92.0 lb

## 2020-12-12 DIAGNOSIS — E785 Hyperlipidemia, unspecified: Secondary | ICD-10-CM

## 2020-12-12 DIAGNOSIS — R21 Rash and other nonspecific skin eruption: Secondary | ICD-10-CM | POA: Diagnosis not present

## 2020-12-12 DIAGNOSIS — E079 Disorder of thyroid, unspecified: Secondary | ICD-10-CM

## 2020-12-12 DIAGNOSIS — R0600 Dyspnea, unspecified: Secondary | ICD-10-CM | POA: Diagnosis not present

## 2020-12-12 DIAGNOSIS — U071 COVID-19: Secondary | ICD-10-CM | POA: Insufficient documentation

## 2020-12-12 LAB — LIPID PANEL
Cholesterol: 277 mg/dL — ABNORMAL HIGH (ref 0–200)
HDL: 88.9 mg/dL (ref >39.00)
LDL Cholesterol: 169 mg/dL — ABNORMAL HIGH (ref 0–99)
NonHDL: 187.79
Total CHOL/HDL Ratio: 3
Triglycerides: 95 mg/dL (ref 0.0–149.0)
VLDL: 19 mg/dL (ref 0.0–40.0)

## 2020-12-12 LAB — TSH: TSH: 0.01 u[IU]/mL — ABNORMAL LOW (ref 0.35–5.50)

## 2020-12-12 LAB — CBC
HCT: 40.9 % (ref 36.0–46.0)
Hemoglobin: 13.2 g/dL (ref 12.0–15.0)
MCHC: 32.2 g/dL (ref 30.0–36.0)
MCV: 85.5 fl (ref 78.0–100.0)
Platelets: 227 10*3/uL (ref 150.0–400.0)
RBC: 4.78 Mil/uL (ref 3.87–5.11)
RDW: 14.7 % (ref 11.5–15.5)
WBC: 6.3 10*3/uL (ref 4.0–10.5)

## 2020-12-12 LAB — COMPREHENSIVE METABOLIC PANEL
ALT: 16 U/L (ref 0–35)
AST: 25 U/L (ref 0–37)
Albumin: 4.5 g/dL (ref 3.5–5.2)
Alkaline Phosphatase: 41 U/L (ref 39–117)
BUN: 14 mg/dL (ref 6–23)
CO2: 34 mEq/L — ABNORMAL HIGH (ref 19–32)
Calcium: 10.4 mg/dL (ref 8.4–10.5)
Chloride: 98 mEq/L (ref 96–112)
Creatinine, Ser: 0.96 mg/dL (ref 0.40–1.20)
GFR: 57.42 mL/min — ABNORMAL LOW (ref >60.00)
Glucose, Bld: 89 mg/dL (ref 70–99)
Potassium: 4.1 mEq/L (ref 3.5–5.1)
Sodium: 138 mEq/L (ref 135–145)
Total Bilirubin: 0.6 mg/dL (ref 0.2–1.2)
Total Protein: 6.9 g/dL (ref 6.0–8.3)

## 2020-12-12 LAB — T3, FREE: T3, Free: 6.9 pg/mL — ABNORMAL HIGH (ref 2.3–4.2)

## 2020-12-12 LAB — T4, FREE: Free T4: 0.47 ng/dL — ABNORMAL LOW (ref 0.60–1.60)

## 2020-12-12 MED ORDER — LIOTHYRONINE SODIUM 25 MCG PO TABS: 25.0000 ug | ORAL_TABLET | Freq: Every day | ORAL | 1 refills | Status: DC

## 2020-12-12 NOTE — Assessment & Plan Note (Signed)
Diagnosed in Early August and was mild. She notes some dyspnea but this has been relatively stable since covid. Has been increasing over a longer period of time.

## 2020-12-12 NOTE — Assessment & Plan Note (Signed)
Labs show overtreated. Drop Cytomel to 12.5 mg daily and continue Levothyroxine. Continue to monitor

## 2020-12-12 NOTE — Patient Instructions (Addendum)
Consider Tdap when you are ready and/or with an injury, can call for nurse visit unless it is bad enough you think you need stitches etc  Shingrix is the new shingles shot, 2 shots over 2-6 months, confirm coverage with insurance and document, then can return here for shots with nurse appt or at pharmacy and consider pneumonia shots   Paxlovid is the new COVID medication we can give you if you get CO VID so make sure you test if you have symptoms because we have to treat by day 5 of symptoms for it to be effective. If you are positive let us know so we can treat. If a home test is negative and your symptoms are persistent get a PCR test. Can check testing locations at [email or web address] If you are positive we will make an appointment with [place] and we will send in Paxlovid if you would like it. Check with your pharmacy before we meet to confirm they have it in stock, if they do not then we can get the prescription at the [facility], Adult Shortness of breath is when a person has trouble breathing enough air or when a person feels like she or he is having trouble breathing in enough air. Shortness of breath could be a sign of a medical problem. Follow these instructions at home:  Pay attention to any changes in your symptoms. Do not use any products that contain nicotine or tobacco, such as cigarettes, e-cigarettes, and chewing tobacco. Do not smoke. Smoking is a common cause of shortness of breath. If you need help quitting, ask your health care provider. Avoid things that can irritate your airways, such as: Mold. Dust. Air pollution. Chemical fumes. Things that can cause allergy symptoms (allergens), if you have allergies. Keep your living space clean and free of mold and dust. Rest as needed. Slowly return to your usual activities. Take over-the-counter and prescription medicines only as told by your health care provider. This includes oxygen therapy and  inhaled medicines. Keep all follow-up visits as told by your health care provider. This is important. Contact a health care provider if: Your condition does not improve as soon as expected. You have a hard time doing your normal activities, even after you rest. You have new symptoms. Get help right away if: Your shortness of breath gets worse. You have shortness of breath when you are resting. You feel light-headed or you faint. You have a cough that is not controlled with medicines. You cough up blood. You have pain with breathing. You have pain in your chest, arms, shoulders, or abdomen. You have a fever. You cannot walk up stairs or exercise the way that you normally do. These symptoms may represent a serious problem that is an emergency. Do not wait to see if the symptoms will go away. Get medical help right away. Call your local emergency services (911 in the [place] Do not drive yourself to the hospital. Summary Shortness of breath is when a person has trouble breathing enough air. It can be a sign of a medical problem. Avoid things that irritate your lungs, such as smoking, pollution, mold, and dust. Pay attention to changes in your symptoms and contact your health care provider if you have a hard time completing daily activities because of shortness of breath. This information is not intended to replace advice given to you by your health care provider. Make sure you discuss any questions you have with your health  care provider. Document Revised: 08/30/2017 Document Reviewed: 08/30/2017 Elsevier Patient Education  2022 [employer].

## 2020-12-12 NOTE — Assessment & Plan Note (Signed)
Resolved with steroids and benadryl and has not recurred

## 2020-12-12 NOTE — Progress Notes (Signed)
[name], female    DOB: 1945-12-13  Age: 75 y.o. MRN: [id number]    Subjective:  Subjective  HPI Alanni Hoselton Vogt-Nichols presents for office visit today for follow up on rash and thyroid disease. She reports that she had COVID-19 in the beginning of August. Symptoms were mild however and she did not experience fevers. Two weeks later she noted her sclera were bright red bilaterally. She saw her Ophthalmologist and was told that it was not serious and not to worry. Weeks later, she developed rashes all over her body that were itchy. She visited Dr. Etter Sjogren and she administered a methylPREDNISolone acetate 80 mg injection. She still experiences covid fatigue at the moment even after recovery. Denies CP/tachycardia/palp/HA/congestion/fevers/GI or GU c/o. Taking meds as prescribed.  She has had SOB for a few months now, but it does not happen all the time. 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 She did not see a cardiologist before, however she is open to see one if things get worse.   Review of Systems  Constitutional:  Positive for fatigue. Negative for chills and fever.  HENT:  Negative for congestion, rhinorrhea, sinus pressure, sinus pain, sore throat and trouble swallowing.   Eyes:  Negative for pain.  Respiratory:  Positive for shortness of breath. Negative for cough.   Cardiovascular:  Negative for chest pain, palpitations and leg swelling.  Gastrointestinal:  Negative for abdominal pain, blood in stool, diarrhea, nausea and vomiting.  Genitourinary:  Negative for decreased urine volume, flank pain, frequency, vaginal bleeding and vaginal discharge.  Musculoskeletal:  Negative for back pain.  Neurological:  Negative for headaches.   History Past Medical History:  Diagnosis Date   Abdominal pain 02/01/2015   Allergy    "seasonal allergies"   Anemia    Arthritis    osteoarthritis-"DDD" hips. fingers, toes.    Blood transfusion without reported diagnosis    Complication of anesthesia    Depression    Depression with anxiety    Frequent headaches    no problems now.   Hearing loss in right ear 03/10/2015   same over last 6 months   Hip dislocation, right (HCC)    History of chicken pox    Hyperlipidemia    pt. 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    She has a past surgical history that includes Tubal ligation (1980); Total hip arthroplasty (2011); Metacarpophalangeal joint capsulectomy / capsulotomy (1995); Total hip arthroplasty (Right, 06/12/2015); Hip Closed Reduction (Right, 06/30/2015); Total hip revision (Right, 07/19/2015); Acetabular revision (Right, 10/30/2015); Tonsillectomy; Breast surgery; Eye surgery; Joint replacement; Spinal cord stimulator insertion (N/A, 12/02/2016); Spinal cord stimulator removal (N/A, 12/31/2016); Reduction mammaplasty; and Breast excisional biopsy (Right).   Her family history includes Arthritis in her maternal grandmother and mother; Dementia in her mother; Kidney disease in her son; Mental illness in her father.She reports that she has never smoked. She has never used smokeless tobacco. She reports current alcohol use of  about 1.0 standard drink per week. She reports that she does not use drugs.  Current Outpatient Medications on File Prior to Visit  Medication Sig Dispense Refill   b complex vitamins tablet Take 1 tablet by mouth daily.     cetirizine (ZYRTEC) 10 MG tablet Take 10 mg by mouth daily.      cholecalciferol (VITAMIN D)  1000 units tablet Take 1,000 Units by mouth daily.     DULoxetine (CYMBALTA) 30 MG capsule Take 1 capsule (30 mg total) by mouth daily. 90 capsule 3   DULoxetine (CYMBALTA) 60 MG capsule Take 1 capsule (60 mg total) by mouth daily. 90 capsule 3   furosemide (LASIX) 20 MG tablet Take 1 tablet (20 mg total) by mouth daily. 90 tablet 3   gabapentin (NEURONTIN) 300 MG capsule Take 1 capsule (300 mg total) by mouth at bedtime. 90 capsule 0   Ketotifen Fumarate (ZADITOR OP) Place 1 drop into both eyes 2 (two) times daily as needed (itchy eyes).     levothyroxine (SYNTHROID) 25 MCG tablet Take 1 tablet (25 mcg total) by mouth daily before breakfast. 90 tablet 3   No current facility-administered medications on file prior to visit.     Objective:  Objective  Physical Exam Constitutional:      General: She is not in acute distress.    Appearance: Normal appearance. She is not ill-appearing or toxic-appearing.  HENT:     Head: Normocephalic and atraumatic.     Right Ear: Tympanic membrane, ear canal and external ear normal.     Left Ear: Tympanic membrane, ear canal and external ear normal.     Nose: No congestion or rhinorrhea.  Eyes:     Extraocular Movements: Extraocular movements intact.     Pupils: Pupils are equal, round, and reactive to light.  Cardiovascular:     Rate and Rhythm: Normal rate and regular rhythm.     Pulses: Normal pulses.     Heart sounds: Murmur heard.  Systolic murmur is present with a grade of 1/6.  Pulmonary:     Effort: Pulmonary effort is normal. No respiratory distress.     Breath sounds: Normal breath sounds. No wheezing, rhonchi or rales.  Abdominal:     General: Bowel sounds are normal.     Palpations: Abdomen is soft. There is no mass.     Tenderness: no abdominal tenderness There is no guarding.     Hernia: No hernia is present.  Musculoskeletal:        General: Normal range of motion.     Cervical back: Normal range of motion and neck supple.   Skin:    General: Skin is warm and dry.  Neurological:     Mental Status: She is alert and oriented to person, place, and time.  Psychiatric:        Behavior: Behavior normal.   BP 110/68   Pulse 70   Temp 97.6 F (36.4 C)   Resp 16   Wt 92 lb (41.7 kg)   SpO2 97%   BMI 15.31 kg/m  Wt Readings from Last 3 Encounters:  12/12/20 92 lb (41.7 kg)  11/19/20 94 lb 12.8 oz (43 kg)  08/01/20 98 lb 12.8 oz (44.8 kg)     Lab Results  Component Value Date   WBC 6.3 12/12/2020   HGB 13.2 12/12/2020   HCT 40.9 12/12/2020   PLT 227.0 12/12/2020   GLUCOSE 89 12/12/2020   CHOL 277 (H) 12/12/2020   TRIG  95.0 12/12/2020   HDL 88.90 12/12/2020   LDLCALC 169 (H) 12/12/2020   ALT 16 12/12/2020   AST 25 12/12/2020   NA 138 12/12/2020   K 4.1 12/12/2020   CL 98 12/12/2020   CREATININE 0.96 12/12/2020   BUN 14 12/12/2020   CO2 34 (H) 12/12/2020   TSH 0.01 (L) 12/12/2020   INR 0.95 10/29/2015    MR TMJ  Result Date: 02/05/2020 CLINICAL DATA:  Right neck and jaw pain for 3 months. EXAM: MRI OF TEMPOROMANDIBULAR JOINT WITHOUT CONTRAST TECHNIQUE: Multiplanar, multisequence MR imaging of the temporomandibular joint was performed following the standard protocol. No intravenous contrast was administered. COMPARISON:  [facility] 02/02/2020.  MRI brain 09/11/2019. FINDINGS: Right temporomandibular joint: The right condylar head appears eroded with subchondral edema and sclerosis. There is a large right TMJ effusion.The articular disc is not well visualized and shows no reduction with mouth opening. There is reduced anterior translation of the mandibular condyle with jaw opening. Left temporomandibular joint: The articular disc appears slightly anteriorly subluxed with the mouth closed. There is early reduction of the disc (at the 1st increment of mouth opening). The anterior translation of the mandibular condyle with jaw opening is mildly reduced.No joint effusion or erosive changes identified. Other:  None. IMPRESSION: 1. 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 The articular disc is not well visualized and shows no reduction with mouth opening. 2. There is early reduction of the left articular disc (at the 1st increment of mouth opening). 3. Decreased anterior translation of the mandibular condyles with jaw opening. Electronically Signed   By: Richardean Sale M.D.   On: 02/05/2020 09:19   MR Cervical Spine Wo Contrast  Result Date: 02/04/2020 CLINICAL DATA:  Chronic neck pain, right side.  Jaw pain. EXAM: MRI CERVICAL SPINE WITHOUT CONTRAST TECHNIQUE: Multiplanar, multisequence MR imaging of the cervical spine was performed. No intravenous contrast was administered. COMPARISON:  None. FINDINGS: Alignment: Physiologic. Vertebrae: No fracture, evidence of discitis, or bone lesion. Cord: Normal signal and morphology. Posterior Fossa, vertebral arteries, paraspinal tissues: Negative. Disc levels: C1-2: Unremarkable. C2-3: Small central disc protrusion. There is no spinal canal stenosis. No neural foraminal stenosis. C3-4: Mild uncovertebral spurring and facet hypertrophy. There is no spinal canal stenosis. Moderate right neural foraminal stenosis. C4-5: Left-greater-than-right uncovertebral hypertrophy. There is no spinal canal stenosis. Mild left neural foraminal stenosis. C5-6: Disc bulge with bilateral uncovertebral hypertrophy. Mild spinal canal stenosis. Severe right and moderate left neural foraminal stenosis. C6-7: Disc bulge with uncovertebral spurring. There is no spinal canal stenosis. Moderate left neural foraminal stenosis. C7-T1: Normal disc space and facet joints. There is no spinal canal stenosis. No neural foraminal stenosis. IMPRESSION: 1. Multilevel moderate-to-severe neural foraminal stenosis, worst at right C5-6. 2. Mild spinal canal stenosis at C5-6. Electronically Signed   By: Ulyses Jarred M.D.   On: 02/04/2020  01:26     Assessment & Plan:  Plan    Meds ordered this encounter  Medications   liothyronine (CYTOMEL) 25 MCG tablet    Sig: Take 1 tablet (25 mcg total) by mouth daily.    Dispense:  90 tablet    Refill:  1    Problem List Items Addressed This Visit     Hyperlipidemia - Primary    Encourage heart healthy diet such as MIND or DASH diet, increase exercise, avoid trans fats, simple carbohydrates and processed foods, consider a krill or fish or flaxseed oil cap  daily.       Relevant Orders   Comprehensive metabolic panel (Completed)   Lipid panel (Completed)   Thyroid disease    Labs show overtreated. Drop Cytomel to 12.5 mg daily and continue Levothyroxine. Continue to monitor      Relevant Medications   liothyronine (CYTOMEL) 25 MCG tablet   Other Relevant Orders   TSH (Completed)   T4, free (Completed)   T3, free (Completed)   Rash    Resolved with steroids and benadryl and has not recurred      COVID-19    Diagnosed in Early August and was mild. She notes some dyspnea but this has been relatively stable since covid. Has been increasing over a longer period of time.       Relevant Orders   CBC (Completed)   Dyspnea    Slowly worsening over some months, no associated CP or palp. EKG normal today, largely exertional. Check Echo and consider referral to cardiology for evaluation      Relevant Orders   EKG 12-Lead (Completed)   ECHOCARDIOGRAM COMPLETE    Follow-up: Return in about 3 months (around 03/13/2021).  I, Suezanne Jacquet, acting as a scribe for Penni Homans, MD, have documented all relevent documentation on behalf of Penni Homans, MD, as directed by Penni Homans, MD while in the presence of Penni Homans, MD.  I, Mosie Lukes, MD personally performed the services described in this documentation. All medical record entries made by the scribe were at my direction and in my presence. I have reviewed the chart and agree that the record reflects my personal  performance and is accurate and complete

## 2020-12-12 NOTE — Assessment & Plan Note (Signed)
Encourage heart healthy diet such as MIND or DASH diet, increase exercise, avoid trans fats, simple carbohydrates and processed foods, consider a krill or fish or flaxseed oil cap daily.  °

## 2020-12-12 NOTE — Assessment & Plan Note (Signed)
Slowly worsening over some months, no associated CP or palp. EKG normal today, largely exertional. Check Echo and consider referral to cardiology for evaluation

## 2020-12-24 ENCOUNTER — Ambulatory Visit (INDEPENDENT_AMBULATORY_CARE_PROVIDER_SITE_OTHER): Payer: Medicare Other

## 2020-12-24 VITALS — Ht 65.0 in | Wt 93.0 lb

## 2020-12-24 DIAGNOSIS — Z0000 Encounter for general adult medical examination without abnormal findings: Secondary | ICD-10-CM | POA: Diagnosis not present

## 2020-12-24 NOTE — Progress Notes (Signed)
Subjective:   Misty Barker is a 75 y.o. female who presents for Medicare Annual (Subsequent) preventive examination.  I connected with Caelen today by telephone and verified that I am speaking with the correct person using two identifiers. Location patient: home Location provider: work Persons participating in the virtual visit: patient, Marine scientist.    I discussed the limitations, risks, security and privacy concerns of performing an evaluation and management service by telephone and the availability of in person appointments. I also discussed with the patient that there may be a patient responsible charge related to this service. The patient expressed understanding and verbally consented to this telephonic visit.    Interactive audio and video telecommunications were attempted between this provider and patient, however failed, due to patient having technical difficulties OR patient did not have access to video capability.  We continued and completed visit with audio only.  Some vital signs may be absent or patient reported.   Time Spent with patient on telephone encounter: 20 minutes   Review of Systems     Cardiac Risk Factors include: advanced age (>84mn, >>75women);dyslipidemia     Objective:    Today's Vitals   12/24/20 1101  Weight: 93 lb (42.2 kg)  Height: '5\' 5"'$  (1.651 m)   Body mass index is 15.48 kg/m.  Advanced Directives 12/24/2020 02/04/2017 12/02/2016 11/30/2016 07/21/2016 07/07/2016 02/03/2016  Does Patient Have a Medical Advance Directive? No No No No No No No  Copy of Healthcare Power of Attorney in Chart? - - - - - - No - copy requested  Would patient like information on creating a medical advance directive? No - Patient declined - No - Patient declined Yes (MAU/Ambulatory/Procedural Areas - Information given) - - -    Current Medications (verified) Outpatient Encounter Medications as of 12/24/2020  Medication Sig   b complex vitamins tablet Take 1  tablet by mouth daily.   cetirizine (ZYRTEC) 10 MG tablet Take 10 mg by mouth daily.    cholecalciferol (VITAMIN D) 1000 units tablet Take 1,000 Units by mouth daily.   DULoxetine (CYMBALTA) 30 MG capsule Take 1 capsule (30 mg total) by mouth daily.   DULoxetine (CYMBALTA) 60 MG capsule Take 1 capsule (60 mg total) by mouth daily.   furosemide (LASIX) 20 MG tablet Take 1 tablet (20 mg total) by mouth daily.   gabapentin (NEURONTIN) 300 MG capsule Take 1 capsule (300 mg total) by mouth at bedtime.   Ketotifen Fumarate (ZADITOR OP) Place 1 drop into both eyes 2 (two) times daily as needed (itchy eyes).   levothyroxine (SYNTHROID) 25 MCG tablet Take 1 tablet (25 mcg total) by mouth daily before breakfast.   liothyronine (CYTOMEL) 25 MCG tablet Take 1 tablet (25 mcg total) by mouth daily.   No facility-administered encounter medications on file as of 12/24/2020.    Allergies (verified) Penicillins and Sulfa antibiotics   History: Past Medical History:  Diagnosis Date   Abdominal pain 02/01/2015   Allergy    "seasonal allergies"   Anemia    Arthritis    osteoarthritis-"DDD" hips. fingers, toes.   Blood transfusion without reported diagnosis    Complication of anesthesia    Depression    Depression with anxiety    Frequent headaches    no problems now.   Hearing loss in right ear 03/10/2015   same over last 6 months   Hip dislocation, right (HCC)    History of chicken pox    Hyperlipidemia    pt.  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   Past Surgical  History:  Procedure Laterality Date   ACETABULAR REVISION Right 10/30/2015   Procedure: RIGHT HIP ACETABULAR REVISION /CONSTRAINED LINER (POSTERIOR APPROACH) ;  Surgeon: Gaynelle Arabian, MD;  Location: [facility];  Service: Orthopedics;  Laterality: Right;   BREAST EXCISIONAL BIOPSY Right    BREAST SURGERY     lump removed, and breast reduction   EYE SURGERY     lasik   2002   HIP CLOSED REDUCTION Right 06/30/2015   Procedure: CLOSED MANIPULATION HIP;  Surgeon: Melina Schools, MD;  Location: [facility];  Service: Orthopedics;  Laterality: Right;   JOINT REPLACEMENT     3 hips on right and 1 on left   METACARPOPHALANGEAL JOINT CAPSULECTOMY / CAPSULOTOMY  1995   REDUCTION MAMMAPLASTY     SPINAL CORD STIMULATOR INSERTION N/A 12/02/2016   Procedure: LUMBAR SPINAL CORD STIMULATOR INSERTION;  Surgeon: Melina Schools, MD;  Location: [place];  Service: Orthopedics;  Laterality: N/A;  2.5 hrs   SPINAL CORD STIMULATOR REMOVAL N/A 12/31/2016   Procedure: LUMBAR SPINAL CORD STIMULATOR REMOVAL;  Surgeon: Melina Schools, MD;  Location: [place];  Service: Orthopedics;  Laterality: N/A;  60 mins   TONSILLECTOMY     TOTAL HIP ARTHROPLASTY  2011   left hip   TOTAL HIP ARTHROPLASTY Right 06/12/2015   Procedure: RIGHT TOTAL HIP ARTHROPLASTY ANTERIOR APPROACH;  Surgeon: Gaynelle Arabian, MD;  Location: [facility];  Service: Orthopedics;  Laterality: Right;   TOTAL HIP REVISION Right 07/19/2015   Procedure: RIGHT HIP BEARING SURFACE REVISION;  Surgeon: Gaynelle Arabian, MD;  Location: [facility];  Service: Orthopedics;  Laterality: Right;   TUBAL LIGATION  1980   left fallopian tube removal   Family History  Problem Relation Age of Onset   Arthritis Mother    Dementia Mother    Mental illness Father        suicide   Arthritis Maternal Grandmother    Kidney disease Son        b/l multicystic kidney disease s/p 4 transplants   Colon cancer Neg Hx    Social History   Socioeconomic History   Marital status: Married    Spouse name: Not  on file   Number of children: Not on file   Years of education: 60   Highest education level: Not on file  Occupational History   Occupation: retired  Tobacco Use   Smoking status: Never   Smokeless tobacco: Never  Vaping Use   Vaping Use: Never used  Substance and Sexual Activity   Alcohol use: Yes    Alcohol/week: 1.0 standard drink    Types: 1 Glasses of wine per week   Drug use: No   Sexual activity: Not on file    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, retired Psychologist, counselling, 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  Other Topics Concern   Not on file  Social History Narrative   Not on file   Social Determinants of Radio broadcast assistant  Strain: Low Risk    Difficulty of Paying Living Expenses: Not hard at all  Food Insecurity: No Food Insecurity   Worried About Charity fundraiser in the Last Year: Never true   Ran Out of Food in the Last Year: Never true  Transportation Needs: No Transportation Needs   Lack of Transportation (Medical): No   Lack of Transportation (Non-Medical): No  Physical Activity: Insufficiently Active   Days of Exercise per Week: 4 days   Minutes of Exercise per Session: 30 min  Stress: Stress Concern Present   Feeling of Stress : To some extent  Social Connections: Socially Isolated   Frequency of Communication with Friends and Family: Once a week   Frequency of Social Gatherings with Friends and Family: Once a week   Attends Religious Services: Never   Printmaker: No   Attends Music therapist: Never   Marital Status: Married    Tobacco Counseling Counseling given: Not Answered   Clinical Intake:  Pre-visit preparation completed: Yes        BMI - recorded: 15.48 Nutritional Status: BMI <19  Underweight Nutritional Risks: None Diabetes: No  How often do you need to have someone help you when you read instructions, pamphlets, or other written materials from your doctor or pharmacy?: 1 -  Never  Diabetic?No  Interpreter Needed?: No  Information entered by :: Caroleen Hamman LPN   Activities of Daily Living In your present state of health, do you have any difficulty performing the following activities: 12/24/2020 08/01/2020  Hearing? Y N  Comment hearing aids -  Vision? N N  Difficulty concentrating or making decisions? N N  Walking or climbing stairs? N N  Dressing or bathing? N N  Doing errands, shopping? N N  Preparing Food and eating ? N -  Using the Toilet? N -  In the past six months, have you accidently leaked urine? N -  Do you have problems with loss of bowel control? N -  Managing your Medications? N -  Managing your Finances? N -  Housekeeping or managing your Housekeeping? N -  Some recent data might be hidden    Patient Care Team: Mosie Lukes, MD as PCP - General (Family Medicine) Renie Ora, MD as Consulting Physician (Pain Medicine) Levy Pupa, PA-C as Physician Assistant (Orthopedic Surgery)  Indicate any recent Medical Services you may have received from other than [facility] providers in the past year (date may be approximate).     Assessment:   This is a routine wellness examination for [facility].  Hearing/Vision screen Hearing Screening - Comments:: Bilateral hearing aids Vision Screening - Comments:: Last eye exam-07/2020-Dr. Groat  Dietary issues and exercise activities discussed: Current Exercise Habits: Home exercise routine, Type of exercise: strength training/weights;treadmill, Time (Minutes): 30, Frequency (Times/Week): 4, Weekly Exercise (Minutes/Week): 120   Goals Addressed             This Visit's Progress    Patient Stated       Maintain current health & level of activity       Depression Screen PHQ 2/9 Scores 12/24/2020 12/12/2020 01/11/2020 11/25/2017 02/03/2016 03/05/2015  PHQ - 2 Score 1 2 0 0 1 0  PHQ- 9 Score - 8 1 - - -    Fall Risk Fall Risk  12/24/2020 01/11/2020 03/07/2019 11/25/2017 02/03/2016  Falls  in the past year? 1 1 0 No Yes  Comment - - Emmi Telephone Survey: data to providers  prior to load - -  Number falls in past yr: 1 0 - - 2 or more  Injury with Fall? 0 0 - - Yes  Risk for fall due to : History of fall(s) - - - Impaired balance/gait  Follow up Falls prevention discussed - - - -    FALL RISK PREVENTION PERTAINING TO THE HOME:  Any stairs in or around the home? Yes  If so, are there any without handrails? No  Home free of loose throw rugs in walkways, pet beds, electrical cords, etc? Yes  Adequate lighting in your home to reduce risk of falls? Yes   ASSISTIVE DEVICES UTILIZED TO PREVENT FALLS:  Life alert? No  Use of a cane, walker or w/c? No  Grab bars in the bathroom? Yes  Shower chair or bench in shower? No  Elevated toilet seat or a handicapped toilet? No   TIMED UP AND GO:  Was the test performed? No . Phone visit   Cognitive Function:Normal cognitive status assessed by this Nurse Health Advisor. No abnormalities found.   MMSE - Mini Mental State Exam 02/03/2016  Orientation to time 5  Orientation to Place 5  Registration 3  Attention/ Calculation 5  Recall 2  Language- name 2 objects 2  Language- repeat 1  Language- follow 3 step command 3  Language- read & follow direction 1  Write a sentence 1  Copy design 1  Total score 29        Immunizations Immunization History  Administered Date(s) Administered   Fluad Quad(high Dose 65+) 12/22/2018, 01/11/2020   PFIZER Comirnaty(Gray Top)Covid-19 Tri-Sucrose Vaccine 07/16/2020   PFIZER(Purple Top)SARS-COV-2 Vaccination 05/09/2019, 05/30/2019, 02/20/2020    TDAP status: Due, Education has been provided regarding the importance of this vaccine. Advised may receive this vaccine at local pharmacy or Health Dept. Aware to provide a copy of the vaccination record if obtained from local pharmacy or Health Dept. Verbalized acceptance and understanding.  Flu Vaccine status: Due, Education has been provided  regarding the importance of this vaccine. Advised may receive this vaccine at local pharmacy or Health Dept. Aware to provide a copy of the vaccination record if obtained from local pharmacy or Health Dept. Verbalized acceptance and understanding.  Pneumococcal vaccine status: Due, Education has been provided regarding the importance of this vaccine. Advised may receive this vaccine at local pharmacy or Health Dept. Aware to provide a copy of the vaccination record if obtained from local pharmacy or Health Dept. Verbalized acceptance and understanding.  Covid-19 vaccine status: Completed vaccines  Qualifies for Shingles Vaccine? Yes   Zostavax completed No   Shingrix Completed?: No.    Education has been provided regarding the importance of this vaccine. Patient has been advised to call insurance company to determine out of pocket expense if they have not yet received this vaccine. Advised may also receive vaccine at local pharmacy or Health Dept. Verbalized acceptance and understanding.  Screening Tests Health Maintenance  Topic Date Due   TETANUS/TDAP  Never done   Zoster Vaccines- Shingrix (1 of 2) Never done   PNA vac Low Risk Adult (1 of 2 - PCV13) Never done   INFLUENZA VACCINE  01/10/2021 (Originally 11/11/2020)   COVID-19 Vaccine (5 - Booster for Pfizer series) 03/29/2021 (Originally 11/15/2020)   DEXA SCAN  Completed   Hepatitis C Screening  Completed   HPV VACCINES  Aged Out    Health Maintenance  Health Maintenance Due  Topic Date Due   TETANUS/TDAP  Never done  Zoster Vaccines- Shingrix (1 of 2) Never done   PNA vac Low Risk Adult (1 of 2 - PCV13) Never done    Colorectal cancer screening: No longer required.   Mammogram status: Due-Declined  Bone Density status: Due-Declined  Lung Cancer Screening: (Low Dose CT Chest recommended if Age 10-80 years, 30 pack-year currently smoking OR have quit w/in 15years.) does not qualify.     Additional Screening:  Hepatitis C  Screening: Completed 08/01/2020  Vision Screening: Recommended annual ophthalmology exams for early detection of glaucoma and other disorders of the eye. Is the patient up to date with their annual eye exam?  Yes  Who is the provider or what is the name of the office in which the patient attends annual eye exams? Dr. Katy Fitch   Dental Screening: Recommended annual dental exams for proper oral hygiene  Community Resource Referral / Chronic Care Management: CRR required this visit?  No   CCM required this visit?  No      Plan:     I have personally reviewed and noted the following in the patient's chart:   Medical and social history Use of alcohol, tobacco or illicit drugs  Current medications and supplements including opioid prescriptions.  Functional ability and status Nutritional status Physical activity Advanced directives List of other physicians Hospitalizations, surgeries, and ER visits in previous 12 months Vitals Screenings to include cognitive, depression, and falls Referrals and appointments  In addition, I have reviewed and discussed with patient certain preventive protocols, quality metrics, and best practice recommendations. A written personalized care plan for preventive services as well as general preventive health recommendations were provided to patient.   Due to this being a telephonic visit, the after visit summary with patients personalized plan was offered to patient via mail or my-chart. Patient would like to access on my-chart.   Marta Antu, LPN   [id number]  Nurse Health Advisor  Nurse Notes: None

## 2020-12-24 NOTE — Patient Instructions (Signed)
Ms. Misty Barker , Thank you for taking time to complete your Medicare Wellness Visit. I appreciate your ongoing commitment to your health goals. Please review the following plan we discussed and let me know if I can assist you in the future.   Screening recommendations/referrals: Colonoscopy: No longer required Mammogram: Declined today. Please call the office to schedule if you change your mind. Bone Density: Declined today. Please call the office to schedule if you change your mind. Recommended yearly ophthalmology/optometry visit for glaucoma screening and checkup Recommended yearly dental visit for hygiene and checkup  Vaccinations: Influenza vaccine: Due-May obtain vaccine at our office or your local pharmacy. Pneumococcal vaccine: Due-May obtain vaccine at our office or your local pharmacy. Tdap vaccine: Discuss with pharmacy Shingles vaccine: Discuss with pharmacy   Covid-19:Up to date  Advanced directives: Declined information today.  Conditions/risks identified: See problem list  Next appointment: Follow up in one year for your annual wellness visit    Preventive Care 65 Years and Older, Female Preventive care refers to lifestyle choices and visits with your health care provider that can promote health and wellness. What does preventive care include? A yearly physical exam. This is also called an annual well check. Dental exams once or twice a year. Routine eye exams. Ask your health care provider how often you should have your eyes checked. Personal lifestyle choices, including: Daily care of your teeth and gums. Regular physical activity. Eating a healthy diet. Avoiding tobacco and drug use. Limiting alcohol use. Practicing safe sex. Taking low-dose aspirin every day. Taking vitamin and mineral supplements as recommended by your health care provider. What happens during an annual well check? The services and screenings done by your health care provider during your  annual well check will depend on your age, overall health, lifestyle risk factors, and family history of disease. Counseling  Your health care provider may ask you questions about your: Alcohol use. Tobacco use. Drug use. Emotional well-being. Home and relationship well-being. Sexual activity. Eating habits. History of falls. Memory and ability to understand (cognition). Work and work Statistician. Reproductive health. Screening  You may have the following tests or measurements: Height, weight, and BMI. Blood pressure. Lipid and cholesterol levels. These may be checked every 5 years, or more frequently if you are over 80 years old. Skin check. Lung cancer screening. You may have this screening every year starting at age 59 if you have a 30-pack-year history of smoking and currently smoke or have quit within the past 15 years. Fecal occult blood test (FOBT) of the stool. You may have this test every year starting at age 35. Flexible sigmoidoscopy or colonoscopy. You may have a sigmoidoscopy every 5 years or a colonoscopy every 10 years starting at age 44. Hepatitis C blood test. Hepatitis B blood test. Sexually transmitted disease (STD) testing. Diabetes screening. This is done by checking your blood sugar (glucose) after you have not eaten for a while (fasting). You may have this done every 1-3 years. Bone density scan. This is done to screen for osteoporosis. You may have this done starting at age 10. Mammogram. This may be done every 1-2 years. Talk to your health care provider about how often you should have regular mammograms. Talk with your health care provider about your test results, treatment options, and if necessary, the need for more tests. Vaccines  Your health care provider may recommend certain vaccines, such as: Influenza vaccine. This is recommended every year. Tetanus, diphtheria, and acellular pertussis (Tdap, Td) vaccine.  You may need a Td booster every 10  years. Zoster vaccine. You may need this after age 64. Pneumococcal 13-valent conjugate (PCV13) vaccine. One dose is recommended after age 79. Pneumococcal polysaccharide (PPSV23) vaccine. One dose is recommended after age 30. Talk to your health care provider about which screenings and vaccines you need and how often you need them. This information is not intended to replace advice given to you by your health care provider. Make sure you discuss any questions you have with your health care provider. Document Released: 04/26/2015 Document Revised: 12/18/2015 Document Reviewed: 01/29/2015 Elsevier Interactive Patient Education  2017 [place] Prevention in the Home Falls can cause injuries. They can happen to people of all ages. There are many things you can do to make your home safe and to help prevent falls. What can I do on the outside of my home? Regularly fix the edges of walkways and driveways and fix any cracks. Remove anything that might make you trip as you walk through a door, such as a raised step or threshold. Trim any bushes or trees on the path to your home. Use bright outdoor lighting. Clear any walking paths of anything that might make someone trip, such as rocks or tools. Regularly check to see if handrails are loose or broken. Make sure that both sides of any steps have handrails. Any raised decks and porches should have guardrails on the edges. Have any leaves, snow, or ice cleared regularly. Use sand or salt on walking paths during winter. Clean up any spills in your garage right away. This includes oil or grease spills. What can I do in the bathroom? Use night lights. Install grab bars by the toilet and in the tub and shower. Do not use towel bars as grab bars. Use non-skid mats or decals in the tub or shower. If you need to sit down in the shower, use a plastic, non-slip stool. Keep the floor dry. Clean up any water that spills on the floor as soon as it  happens. Remove soap buildup in the tub or shower regularly. Attach bath mats securely with double-sided non-slip rug tape. Do not have throw rugs and other things on the floor that can make you trip. What can I do in the bedroom? Use night lights. Make sure that you have a light by your bed that is easy to reach. Do not use any sheets or blankets that are too big for your bed. They should not hang down onto the floor. Have a firm chair that has side arms. You can use this for support while you get dressed. Do not have throw rugs and other things on the floor that can make you trip. What can I do in the kitchen? Clean up any spills right away. Avoid walking on wet floors. Keep items that you use a lot in easy-to-reach places. If you need to reach something above you, use a strong step stool that has a grab bar. Keep electrical cords out of the way. Do not use floor polish or wax that makes floors slippery. If you must use wax, use non-skid floor wax. Do not have throw rugs and other things on the floor that can make you trip. What can I do with my stairs? Do not leave any items on the stairs. Make sure that there are handrails on both sides of the stairs and use them. Fix handrails that are broken or loose. Make sure that handrails are as long as  the stairways. Check any carpeting to make sure that it is firmly attached to the stairs. Fix any carpet that is loose or worn. Avoid having throw rugs at the top or bottom of the stairs. If you do have throw rugs, attach them to the floor with carpet tape. Make sure that you have a light switch at the top of the stairs and the bottom of the stairs. If you do not have them, ask someone to add them for you. What else can I do to help prevent falls? Wear shoes that: Do not have high heels. Have rubber bottoms. Are comfortable and fit you well. Are closed at the toe. Do not wear sandals. If you use a stepladder: Make sure that it is fully opened.  Do not climb a closed stepladder. Make sure that both sides of the stepladder are locked into place. Ask someone to hold it for you, if possible. Clearly mark and make sure that you can see: Any grab bars or handrails. First and last steps. Where the edge of each step is. Use tools that help you move around (mobility aids) if they are needed. These include: Canes. Walkers. Scooters. Crutches. Turn on the lights when you go into a dark area. Replace any light bulbs as soon as they burn out. Set up your furniture so you have a clear path. Avoid moving your furniture around. If any of your floors are uneven, fix them. If there are any pets around you, be aware of where they are. Review your medicines with your doctor. Some medicines can make you feel dizzy. This can increase your chance of falling. Ask your doctor what other things that you can do to help prevent falls. This information is not intended to replace advice given to you by your health care provider. Make sure you discuss any questions you have with your health care provider. Document Released: 01/24/2009 Document Revised: 09/05/2015 Document Reviewed: 05/04/2014 Elsevier Interactive Patient Education  2017 [employer].

## 2021-01-02 ENCOUNTER — Other Ambulatory Visit: Payer: Self-pay

## 2021-01-02 ENCOUNTER — Encounter: Payer: Self-pay | Admitting: Family Medicine

## 2021-01-02 ENCOUNTER — Ambulatory Visit (INDEPENDENT_AMBULATORY_CARE_PROVIDER_SITE_OTHER): Payer: Medicare Other

## 2021-01-02 DIAGNOSIS — Z23 Encounter for immunization: Secondary | ICD-10-CM | POA: Diagnosis not present

## 2021-01-06 ENCOUNTER — Ambulatory Visit (HOSPITAL_BASED_OUTPATIENT_CLINIC_OR_DEPARTMENT_OTHER): Payer: Medicare Other

## 2021-01-08 ENCOUNTER — Other Ambulatory Visit: Payer: Self-pay

## 2021-01-08 ENCOUNTER — Ambulatory Visit (HOSPITAL_BASED_OUTPATIENT_CLINIC_OR_DEPARTMENT_OTHER)
Admission: RE | Admit: 2021-01-08 | Discharge: 2021-01-08 | Disposition: A | Discharge status: Home / Self Care | Payer: Medicare Other | Source: Ambulatory Visit | Attending: Family Medicine | Admitting: Family Medicine

## 2021-01-08 DIAGNOSIS — R0600 Dyspnea, unspecified: Secondary | ICD-10-CM | POA: Insufficient documentation

## 2021-01-08 LAB — ECHOCARDIOGRAM COMPLETE
AR max vel: 2.5 cm2
AV Area VTI: 2.5 cm2
AV Area mean vel: 2.22 cm2
AV Mean grad: 2 mmHg
AV Peak grad: 4.2 mmHg
Ao pk vel: 1.02 m/s
Area-P 1/2: 2.67 cm2
Calc EF: 70.6 %
S' Lateral: 2.3 cm
Single Plane A2C EF: 71 %
Single Plane A4C EF: 69.1 %

## 2021-03-19 DIAGNOSIS — Z79899 Other long term (current) drug therapy: Secondary | ICD-10-CM | POA: Diagnosis not present

## 2021-03-19 DIAGNOSIS — Z5181 Encounter for therapeutic drug level monitoring: Secondary | ICD-10-CM | POA: Diagnosis not present

## 2021-03-25 ENCOUNTER — Ambulatory Visit: Payer: Medicare Other | Admitting: Family Medicine

## 2021-03-25 ENCOUNTER — Telehealth: Payer: Self-pay

## 2021-03-25 ENCOUNTER — Other Ambulatory Visit: Payer: Self-pay

## 2021-03-25 DIAGNOSIS — E785 Hyperlipidemia, unspecified: Secondary | ICD-10-CM

## 2021-03-25 DIAGNOSIS — E079 Disorder of thyroid, unspecified: Secondary | ICD-10-CM

## 2021-03-25 NOTE — Telephone Encounter (Signed)
Pt would like to know if she needs labs done or can it wait until she comes in 07/08/21 at 3:40 pm

## 2021-03-25 NOTE — Telephone Encounter (Signed)
Done

## 2021-03-26 ENCOUNTER — Other Ambulatory Visit (INDEPENDENT_AMBULATORY_CARE_PROVIDER_SITE_OTHER): Payer: Medicare Other

## 2021-03-26 DIAGNOSIS — E785 Hyperlipidemia, unspecified: Secondary | ICD-10-CM | POA: Diagnosis not present

## 2021-03-26 DIAGNOSIS — E079 Disorder of thyroid, unspecified: Secondary | ICD-10-CM | POA: Diagnosis not present

## 2021-03-26 LAB — COMPREHENSIVE METABOLIC PANEL
ALT: 17 U/L (ref 0–35)
AST: 25 U/L (ref 0–37)
Albumin: 4.2 g/dL (ref 3.5–5.2)
Alkaline Phosphatase: 44 U/L (ref 39–117)
BUN: 11 mg/dL (ref 6–23)
CO2: 31 mEq/L (ref 19–32)
Calcium: 9.9 mg/dL (ref 8.4–10.5)
Chloride: 99 mEq/L (ref 96–112)
Creatinine, Ser: 1.07 mg/dL (ref 0.40–1.20)
GFR: 50.31 mL/min — ABNORMAL LOW (ref >60.00)
Glucose, Bld: 85 mg/dL (ref 70–99)
Potassium: 3.9 mEq/L (ref 3.5–5.1)
Sodium: 138 mEq/L (ref 135–145)
Total Bilirubin: 0.5 mg/dL (ref 0.2–1.2)
Total Protein: 6.5 g/dL (ref 6.0–8.3)

## 2021-03-26 LAB — CBC WITH DIFFERENTIAL/PLATELET
Basophils Absolute: 0 10*3/uL (ref 0.0–0.1)
Basophils Relative: 0.6 % (ref 0.0–3.0)
Eosinophils Absolute: 0.3 10*3/uL (ref 0.0–0.7)
Eosinophils Relative: 3.7 % (ref 0.0–5.0)
HCT: 40.6 % (ref 36.0–46.0)
Hemoglobin: 12.9 g/dL (ref 12.0–15.0)
Lymphocytes Relative: 25.2 % (ref 12.0–46.0)
Lymphs Abs: 1.8 10*3/uL (ref 0.7–4.0)
MCHC: 31.7 g/dL (ref 30.0–36.0)
MCV: 87.9 fl (ref 78.0–100.0)
Monocytes Absolute: 0.6 10*3/uL (ref 0.1–1.0)
Monocytes Relative: 8.5 % (ref 3.0–12.0)
Neutro Abs: 4.4 10*3/uL (ref 1.4–7.7)
Neutrophils Relative %: 62 % (ref 43.0–77.0)
Platelets: 241 10*3/uL (ref 150.0–400.0)
RBC: 4.62 Mil/uL (ref 3.87–5.11)
RDW: 14.4 % (ref 11.5–15.5)
WBC: 7.2 10*3/uL (ref 4.0–10.5)

## 2021-03-26 LAB — T4, FREE: Free T4: 0.32 ng/dL — ABNORMAL LOW (ref 0.60–1.60)

## 2021-03-26 LAB — LIPID PANEL
Cholesterol: 277 mg/dL — ABNORMAL HIGH (ref 0–200)
HDL: 88 mg/dL (ref >39.00)
LDL Cholesterol: 171 mg/dL — ABNORMAL HIGH (ref 0–99)
NonHDL: 188.73
Total CHOL/HDL Ratio: 3
Triglycerides: 89 mg/dL (ref 0.0–149.0)
VLDL: 17.8 mg/dL (ref 0.0–40.0)

## 2021-03-26 LAB — TSH: TSH: 0.52 u[IU]/mL (ref 0.35–5.50)

## 2021-06-09 ENCOUNTER — Telehealth: Payer: Self-pay

## 2021-06-09 NOTE — Telephone Encounter (Signed)
Pt called to see if there were any cancellations and she didn't want to see anyone else

## 2021-07-07 NOTE — Progress Notes (Signed)
? ?Subjective:  ? ? [name], female    DOB: 1945/11/11, 76 y.o.   MRN: [id number] ? ?Chief Complaint  ?Patient presents with  ? Follow-up  ? Fatigue  ? Headache  ?  Worse in the last 6 months  ? Gait Problem  ? Night Sweats  ? ? ?HPI ?Patient is in today for a follow up. She is not feeling well today. No recent febrile illness or hospitalizations. She is struggling with daily headaches and worsening disequilibrium. She stumbles into doorways and furniture. She feels weak. She still has daily back pain. She is trying to eat well. Denies CP/palp/SOB/HA/congestion/fevers/GI or GU c/o. Taking meds as prescribed  ? ?Past Medical History:  ?Diagnosis Date  ? Abdominal pain 02/01/2015  ? Allergy   ? "seasonal allergies"  ? Anemia   ? Arthritis   ? osteoarthritis-"DDD" hips. fingers, toes.  ? Blood transfusion without reported diagnosis   ? Complication of anesthesia   ? Depression   ? Depression with anxiety   ? Frequent headaches   ? no problems now.  ? Hearing loss in right ear 03/10/2015  ? same over last 6 months  ? Hip dislocation, right ([place])   ? History of chicken pox   ? Hyperlipidemia   ? pt. state no hx  ? Hypokalemia 02/04/2017  ? Hypothyroidism   ? supplement used  ? Osteoporosis   ? Peripheral neuropathy 02/01/2015  ? right hand"carpal tunnel"  ? PONV (postoperative nausea and vomiting)   ? Preventative health care 02/04/2017  ? Right hip pain 05/24/2015  ? Scoliosis 02/01/2015  ? Spinal stenosis of lumbar region 10/23/2016  ? Thyroid disease   ? URI, acute 03/10/2015  ? UTI (urinary tract infection) 12/14/2016  ? Varicose vein of leg   ? right greater than left  ? ? ?Past Surgical History:  ?Procedure Laterality Date  ? ACETABULAR REVISION Right 10/30/2015  ? Procedure: RIGHT HIP ACETABULAR REVISION /CONSTRAINED LINER (POSTERIOR APPROACH) ;  Surgeon: Gaynelle Arabian, MD;  [facility];  Service: Orthopedics;  Laterality: Right;  ? BREAST EXCISIONAL BIOPSY Right   ? BREAST SURGERY    ?  lump removed, and breast reduction  ? EYE SURGERY    ? lasik   2002  ? HIP CLOSED REDUCTION Right 06/30/2015  ? Procedure: CLOSED MANIPULATION HIP;  Surgeon: Melina Schools, MD;  [facility];  Service: Orthopedics;  Laterality: Right;  ? JOINT REPLACEMENT    ? 3 hips on right and 1 on left  ? METACARPOPHALANGEAL JOINT CAPSULECTOMY / CAPSULOTOMY  1995  ? REDUCTION MAMMAPLASTY    ? SPINAL CORD STIMULATOR INSERTION N/A 12/02/2016  ? Procedure: LUMBAR SPINAL CORD STIMULATOR INSERTION;  Surgeon: Melina Schools, MD;  Location: [facility] [place];  Service: Orthopedics;  Laterality: N/A;  2.5 hrs  ? SPINAL CORD STIMULATOR REMOVAL N/A 12/31/2016  ? Procedure: LUMBAR SPINAL CORD STIMULATOR REMOVAL;  Surgeon: Melina Schools, MD;  Location: [place];  Service: Orthopedics;  Laterality: N/A;  60 mins  ? TONSILLECTOMY    ? TOTAL HIP ARTHROPLASTY  2011  ? left hip  ? TOTAL HIP ARTHROPLASTY Right 06/12/2015  ? Procedure: RIGHT TOTAL HIP ARTHROPLASTY ANTERIOR APPROACH;  Surgeon: Gaynelle Arabian, MD;  [facility];  Service: Orthopedics;  Laterality: Right;  ? TOTAL HIP REVISION Right 07/19/2015  ? Procedure: RIGHT HIP BEARING SURFACE REVISION;  Surgeon: Gaynelle Arabian, MD;  [facility];  Service: Orthopedics;  Laterality: Right;  ? TUBAL LIGATION  1980  ?  left fallopian tube removal  ? ? ?Family History  ?Problem Relation Age of Onset  ? Arthritis Mother   ? Dementia Mother   ? Mental illness Father   ?     suicide  ? Arthritis Maternal Grandmother   ? Kidney disease Son   ?     b/l multicystic kidney disease s/p 4 transplants  ? Colon cancer Neg Hx   ? ? ?Social History  ? ?Socioeconomic History  ? Marital status: Married  ?  Spouse name: Not on file  ? Number of children: Not on file  ? Years of education: 76  ? Highest education level: Not on file  ?Occupational History  ? Occupation: retired  ?Tobacco Use  ? Smoking status: Never  ? Smokeless tobacco: Never  ?Vaping Use  ? Vaping Use: Never used  ?Substance and Sexual Activity  ? Alcohol  use: Yes  ?  Alcohol/week: 1.0 standard drink  ?  Types: 1 Glasses of wine per week  ? Drug use: No  ? Sexual activity: Not on file  ?  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 Not on file  ?Social History Narrative  ? Not on file  ? ?Social Determinants of Health  ? ?Financial Resource Strain: Low Risk   ? Difficulty of Paying Living Expenses: Not hard at all  ?Food Insecurity: No Food Insecurity  ? Worried About Charity fundraiser in the Last Year: Never true  ? Ran Out of Food in the Last Year: Never true  ?Transportation Needs: No Transportation Needs  ? Lack of Transportation (Medical): No  ? Lack of Transportation (Non-Medical): No  ?Physical Activity: Insufficiently Active  ? Days of Exercise per Week: 4 days  ? Minutes of Exercise per Session: 30 min  ?Stress: Stress Concern Present  ? Feeling of Stress : To some extent  ?Social Connections: Socially Isolated  ? Frequency of Communication with Friends and Family: Once a week  ? Frequency of Social Gatherings with Friends and Family: Once a week  ? Attends Religious Services: Never  ? Active Member of Clubs or Organizations: No  ? Attends Archivist Meetings: Never  ? Marital Status: Married  ?Intimate Partner Violence: Not At Risk  ? Fear of Current or Ex-Partner: No  ? Emotionally Abused: No  ? Physically Abused: No  ? Sexually Abused: No  ? ? ?Outpatient Medications Prior to Visit  ?Medication Sig Dispense Refill  ? b complex vitamins tablet Take 1 tablet by mouth daily.    ? cetirizine (ZYRTEC) 10 MG tablet Take 10 mg by mouth daily.     ? cholecalciferol (VITAMIN D) 1000 units tablet Take 1,000 Units by mouth daily.    ? DULoxetine (CYMBALTA) 30 MG capsule Take 1 capsule (30 mg total) by mouth daily. 90 capsule 3  ? DULoxetine (CYMBALTA) 60 MG capsule Take 1 capsule (60 mg total) by mouth daily. 90 capsule 3  ? furosemide (LASIX) 20 MG tablet Take 1 tablet (20 mg total) by mouth  daily. 90 tablet 3  ? gabapentin (NEURONTIN) 300 MG capsule Take 1 capsule (300 mg total) by mouth at bedtime. 90 capsule 0  ? Ketotifen Fumarate (ZADITOR OP) Place 1 drop into both eyes 2 (two) times daily as needed (itchy eyes).    ? levothyroxine (SYNTHROID) 25 MCG tablet Take 1 tablet (25 mcg total) by mouth daily before breakfast. 90 tablet 3  ? liothyronine (CYTOMEL)  25 MCG tablet Take 1 tablet (25 mcg total) by mouth daily. 90 tablet 1  ? ?No facility-administered medications prior to visit.  ? ? ?Allergies  ?Allergen Reactions  ? Penicillins Hives  ?  Childhood allergy  ?Has patient had a PCN reaction causing immediate rash, facial/tongue/throat swelling, SOB or lightheadedness with hypotension: No ?Has patient had a PCN reaction causing severe rash involving mucus membranes or skin necrosis: No ?Has patient had a PCN reaction that required hospitalization No ?Has patient had a PCN reaction occurring within the last 10 years: No ?If all of the above answers are "NO", then may proceed with Cephalosporin use.  ? Sulfa Antibiotics Hives  ? ? ?Review of Systems  ?Constitutional:  Positive for malaise/fatigue. Negative for fever.  ?HENT:  Negative for congestion.   ?Eyes:  Negative for blurred vision.  ?Respiratory:  Positive for stridor. Negative for shortness of breath.   ?Cardiovascular:  Negative for chest pain, palpitations and leg swelling.  ?Gastrointestinal:  Negative for abdominal pain, blood in stool and nausea.  ?Genitourinary:  Negative for dysuria and frequency.  ?Musculoskeletal:  Positive for back pain and falls.  ?Skin:  Negative for rash.  ?Neurological:  Positive for dizziness and headaches. Negative for loss of consciousness.  ?Endo/Heme/Allergies:  Negative for environmental allergies.  ?Psychiatric/Behavioral:  Negative for depression. The patient is not nervous/anxious.   ? ?   ?Objective:  ?  ?Physical Exam ?Constitutional:   ?   General: She is not in acute distress. ?   Appearance: She is  well-developed.  ?HENT:  ?   Head: Normocephalic and atraumatic.  ?Eyes:  ?   Conjunctiva/sclera: Conjunctivae normal.  ?Neck:  ?   Thyroid: No thyromegaly.  ?Cardiovascular:  ?   Rate and Rhythm: No

## 2021-07-08 ENCOUNTER — Ambulatory Visit (INDEPENDENT_AMBULATORY_CARE_PROVIDER_SITE_OTHER): Payer: Medicare Other | Admitting: Family Medicine

## 2021-07-08 ENCOUNTER — Encounter: Payer: Self-pay | Admitting: Family Medicine

## 2021-07-08 VITALS — BP 158/88 | HR 67 | Resp 20 | Ht 65.0 in | Wt 97.8 lb

## 2021-07-08 DIAGNOSIS — R519 Headache, unspecified: Secondary | ICD-10-CM | POA: Diagnosis not present

## 2021-07-08 DIAGNOSIS — R0600 Dyspnea, unspecified: Secondary | ICD-10-CM

## 2021-07-08 DIAGNOSIS — G8929 Other chronic pain: Secondary | ICD-10-CM | POA: Diagnosis not present

## 2021-07-08 DIAGNOSIS — E785 Hyperlipidemia, unspecified: Secondary | ICD-10-CM | POA: Diagnosis not present

## 2021-07-08 DIAGNOSIS — E079 Disorder of thyroid, unspecified: Secondary | ICD-10-CM

## 2021-07-08 DIAGNOSIS — R109 Unspecified abdominal pain: Secondary | ICD-10-CM | POA: Diagnosis not present

## 2021-07-08 DIAGNOSIS — M549 Dorsalgia, unspecified: Secondary | ICD-10-CM | POA: Diagnosis not present

## 2021-07-08 DIAGNOSIS — M791 Myalgia, unspecified site: Secondary | ICD-10-CM

## 2021-07-08 DIAGNOSIS — Z0000 Encounter for general adult medical examination without abnormal findings: Secondary | ICD-10-CM

## 2021-07-08 DIAGNOSIS — R42 Dizziness and giddiness: Secondary | ICD-10-CM | POA: Diagnosis not present

## 2021-07-08 LAB — COMPREHENSIVE METABOLIC PANEL
ALT: 17 U/L (ref 0–35)
AST: 27 U/L (ref 0–37)
Albumin: 4.8 g/dL (ref 3.5–5.2)
Alkaline Phosphatase: 49 U/L (ref 39–117)
BUN: 12 mg/dL (ref 6–23)
CO2: 32 mEq/L (ref 19–32)
Calcium: 10.2 mg/dL (ref 8.4–10.5)
Chloride: 98 mEq/L (ref 96–112)
Creatinine, Ser: 1.13 mg/dL (ref 0.40–1.20)
GFR: 47.03 mL/min — ABNORMAL LOW (ref >60.00)
Glucose, Bld: 82 mg/dL (ref 70–99)
Potassium: 4.3 mEq/L (ref 3.5–5.1)
Sodium: 138 mEq/L (ref 135–145)
Total Bilirubin: 0.5 mg/dL (ref 0.2–1.2)
Total Protein: 7 g/dL (ref 6.0–8.3)

## 2021-07-08 LAB — LIPID PANEL
Cholesterol: 294 mg/dL — ABNORMAL HIGH (ref 0–200)
HDL: 97.1 mg/dL (ref >39.00)
LDL Cholesterol: 181 mg/dL — ABNORMAL HIGH (ref 0–99)
NonHDL: 197.34
Total CHOL/HDL Ratio: 3
Triglycerides: 84 mg/dL (ref 0.0–149.0)
VLDL: 16.8 mg/dL (ref 0.0–40.0)

## 2021-07-08 LAB — CK: Total CK: 142 U/L (ref 7–177)

## 2021-07-08 LAB — CBC WITH DIFFERENTIAL/PLATELET
Basophils Absolute: 0 10*3/uL (ref 0.0–0.1)
Basophils Relative: 0.7 % (ref 0.0–3.0)
Eosinophils Absolute: 0.6 10*3/uL (ref 0.0–0.7)
Eosinophils Relative: 8.3 % — ABNORMAL HIGH (ref 0.0–5.0)
HCT: 40.2 % (ref 36.0–46.0)
Hemoglobin: 13.3 g/dL (ref 12.0–15.0)
Lymphocytes Relative: 24.5 % (ref 12.0–46.0)
Lymphs Abs: 1.7 10*3/uL (ref 0.7–4.0)
MCHC: 33.1 g/dL (ref 30.0–36.0)
MCV: 85.9 fl (ref 78.0–100.0)
Monocytes Absolute: 0.6 10*3/uL (ref 0.1–1.0)
Monocytes Relative: 8.1 % (ref 3.0–12.0)
Neutro Abs: 4 10*3/uL (ref 1.4–7.7)
Neutrophils Relative %: 58.4 % (ref 43.0–77.0)
Platelets: 270 10*3/uL (ref 150.0–400.0)
RBC: 4.68 Mil/uL (ref 3.87–5.11)
RDW: 14.2 % (ref 11.5–15.5)
WBC: 6.9 10*3/uL (ref 4.0–10.5)

## 2021-07-08 LAB — T4, FREE: Free T4: 0.4 ng/dL — ABNORMAL LOW (ref 0.60–1.60)

## 2021-07-08 LAB — MAGNESIUM: Magnesium: 2 mg/dL (ref 1.5–2.5)

## 2021-07-08 LAB — TSH: TSH: 1.32 u[IU]/mL (ref 0.35–5.50)

## 2021-07-08 LAB — LIPASE: Lipase: 38 U/L (ref 11.0–59.0)

## 2021-07-08 MED ORDER — SUCRALFATE 1 GM/10ML PO SUSP: 1.0000 g | Freq: Three times a day (TID) | ORAL | 0 refills | Status: DC

## 2021-07-08 MED ORDER — LIOTHYRONINE SODIUM 25 MCG PO TABS: 12.5000 ug | ORAL_TABLET | Freq: Every day | ORAL | 1 refills | Status: DC

## 2021-07-08 NOTE — Patient Instructions (Signed)
?Soak in vinegar and hot water and then apply Lamisil twice daily ? ?[employer] online ? ?Athlete's Foot ?Athlete's foot (tinea pedis) is a fungal infection of the skin on your feet. It often occurs on the skin that is between or underneath the toes. It can also occur on the soles of your feet. The infection can spread from person to person (is contagious). It can also spread when a person's bare feet come in contact with the fungus on shower floors or on items such as shoes. ?What are the causes? ?This condition is caused by a fungus that grows in warm, moist places. You can get athlete's foot by sharing shoes, shower stalls, towels, and wet floors with someone who is infected. Not washing your feet or changing your socks often enough can also lead to athlete's foot. ?What increases the risk? ?This condition is more likely to develop in: ?Men. ?People who have a weak body defense system (immune system). ?People who have diabetes. ?People who use public showers, such as at a gym. ?People who wear heavy-duty shoes, such as Environmental manager. ?Seasons with warm, humid weather. ?What are the signs or symptoms? ?Symptoms of this condition include: ?Itchy areas between your toes or on the soles of your feet. ?White, flaky, or scaly areas between your toes or on the soles of your feet. ?Very itchy small blisters between your toes or on the soles of your feet. ?Small cuts in your skin. These cuts can become infected. ?Thick or discolored toenails. ?How is this diagnosed? ?This condition may be diagnosed with a physical exam and a review of your medical history. Your health care provider may also take a skin or toenail sample to examine under a microscope. ?How is this treated? ?This condition is treated with antifungal medicines. These may be applied as powders, ointments, or creams. In severe cases, an oral antifungal medicine may be given. ?Follow these instructions at home: ?Medicines ?Apply or take  over-the-counter and prescription medicines only as told by your health care provider. ?Apply your antifungal medicine as told by your health care provider. Do not stop using the antifungal even if your condition improves. ?Foot care ?Do not scratch your feet. ?Keep your feet dry: ?Wear cotton or wool socks. Change your socks every day or if they become wet. ?Wear shoes that allow air to flow, such as sandals or canvas tennis shoes. ?Wash and dry your feet, including the area between your toes. Also, wash and dry your feet: ?Every day or as told by your health care provider. ?After exercising. ?General instructions ?Do not let others use towels, shoes, nail clippers, or other personal items that touch your feet. ?Protect your feet by wearing sandals in wet areas, such as locker rooms and shared showers. ?Keep all follow-up visits. This is important. ?If you have diabetes, keep your blood sugar under control. ?Contact a health care provider if: ?You have a fever. ?You have swelling, soreness, warmth, or redness in your foot. ?Your feet are not getting better with treatment. ?Your symptoms get worse. ?You have new symptoms. ?You have severe pain. ?Summary ?Athlete's foot (tinea pedis) is a fungal infection of the skin on your feet. It often occurs on skin that is between or underneath the toes. ?This condition is caused by a fungus that grows in warm, moist places. ?Symptoms include white, flaky, or scaly areas between your toes or on the soles of your feet. ?This condition is treated with antifungal medicines. ?Keep your feet  clean. Always dry them thoroughly. ?This information is not intended to replace advice given to you by your health care provider. Make sure you discuss any questions you have with your health care provider. ?Document Revised: 07/21/2020 Document Reviewed: 07/21/2020 ?Elsevier Patient Education ? [place]. ? ?

## 2021-07-09 DIAGNOSIS — G8929 Other chronic pain: Secondary | ICD-10-CM | POA: Insufficient documentation

## 2021-07-09 DIAGNOSIS — R42 Dizziness and giddiness: Secondary | ICD-10-CM | POA: Insufficient documentation

## 2021-07-09 DIAGNOSIS — M791 Myalgia, unspecified site: Secondary | ICD-10-CM | POA: Insufficient documentation

## 2021-07-09 NOTE — Assessment & Plan Note (Signed)
She reports developing chronic headaches and being off balance, she feels like she stumbles into doorways and furniture frequently and has fallen occasionally. She is referred to neurology for further consideration and a CT of the head. ?

## 2021-07-09 NOTE — Assessment & Plan Note (Signed)
Continues to be constant. Encouraged moist heat and gentle stretching as tolerated. May try NSAIDs and prescription meds as directed and report if symptoms worsen or seek immediate care ?

## 2021-07-09 NOTE — Assessment & Plan Note (Signed)
Hydrate and monitor 

## 2021-07-09 NOTE — Assessment & Plan Note (Signed)
Hydrate and monitor, Encouraged increased hydration, 64 ounces of clear fluids daily. Minimize alcohol and caffeine. Eat small frequent meals with lean proteins and complex carbs. Avoid high and low blood sugars. Get adequate sleep, 7-8 hours a night. Needs exercise daily preferably in the morning. ?

## 2021-07-11 ENCOUNTER — Encounter: Payer: Self-pay | Admitting: Neurology

## 2021-07-17 ENCOUNTER — Ambulatory Visit (HOSPITAL_BASED_OUTPATIENT_CLINIC_OR_DEPARTMENT_OTHER)
Admission: RE | Admit: 2021-07-17 | Discharge: 2021-07-17 | Disposition: A | Discharge status: Home / Self Care | Payer: Medicare Other | Source: Ambulatory Visit | Attending: Family Medicine | Admitting: Family Medicine

## 2021-07-17 ENCOUNTER — Other Ambulatory Visit: Payer: Self-pay

## 2021-07-17 DIAGNOSIS — G8929 Other chronic pain: Secondary | ICD-10-CM | POA: Insufficient documentation

## 2021-07-17 DIAGNOSIS — R519 Headache, unspecified: Secondary | ICD-10-CM | POA: Diagnosis not present

## 2021-08-22 ENCOUNTER — Other Ambulatory Visit: Payer: Self-pay | Admitting: Family Medicine

## 2021-08-26 NOTE — Progress Notes (Signed)
? ?NEUROLOGY CONSULTATION NOTE ? ?Misty Barker ?MRN: [id number] ?DOB: 06-27-1945 ? ?Referring provider: Penni Homans, MD ?Primary care provider: Penni Homans, MD ? ?Reason for consult:  headache ? ?Assessment/Plan:  ? 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 ?Will titrate gabapentin to '200mg'$  in AM and '300mg'$  in PM to help with headache, pain and leg spasms ? ?Will check NCV-EMG of lower extremities ? ?Follow up  ? ? ?Subjective:  ?Misty Barker is a 76 year old female with hypothyroidism, HLD, lumbar spinal stenosis, depression who presents for headache.  History supplemented by primary care note. ? ?Noticeable since at least 2021.  Tired all of the time.  She is unsteady on her feet.  Not dizziness.  Just off balance and bumps into things.  She has chronic back pain due to lumbar spinal stenosis and hip pain for which she underwent surgeries.  Physical therapy wasn't too effective.  Feels generalized weakness.  Drops things.  Hesitates when stepping onto or off of a curb.  Bottom of her feet feel funny but not numb.  She has occipital and bitemporal headaches radiating from her neck daily.  She also has TMJ dysfunction.  Takes ibuprofen daily.  Treats chronic back and hip pain with hydrocodone.  Right index finger becomes white and cold.  Has night sweats.  Labs from March showed unremarkable CBC, CMP (with stable GFR 47), TSH/stable T4, Mg 2, CK 142.  She takes a B12 supplement.  MRI of brain on 09/11/2019 personally reviewed showed chronic small vessel ischemic changes and chronic mastoiditis bilaterally but no acute infarct or mass lesion.  MRI of cervical spine on 02/03/2020 personally reviewed revealed  multilevel moderate to severe neural foraminal stenosis worse at right C5-6 with mild spinal canal stenosis.  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   ? ?Current NSAIDs/analgesics:  ibuprofen, hydrocodone ?Current antidepressant:  duloxetine '90mg'$  daily ?Current antiepileptic: gabapentin '100mg'$  in afternoon and gabapentin '300mg'$  at bedtime ? ?PAST MEDICAL HISTORY: ?Past Medical History:  ?Diagnosis Date  ? Abdominal pain 02/01/2015  ? Allergy   ? "seasonal allergies"  ? Anemia   ? Arthritis   ? osteoarthritis-"DDD" hips. fingers, toes.  ? Blood transfusion without reported diagnosis   ? Complication of anesthesia   ? Depression   ? Depression with anxiety   ? Frequent headaches   ? no problems now.  ? Hearing loss in right ear 03/10/2015  ? same over last 6 months  ? Hip dislocation, right ([place])   ? History of chicken pox   ? Hyperlipidemia   ? pt. state no hx  ? Hypokalemia 02/04/2017  ? Hypothyroidism   ? supplement used  ? Osteoporosis   ? Peripheral neuropathy 02/01/2015  ? right hand"carpal tunnel"  ? PONV (postoperative nausea and vomiting)   ? Preventative health care 02/04/2017  ? Right hip pain 05/24/2015  ? Scoliosis 02/01/2015  ? Spinal stenosis of lumbar region 10/23/2016  ? Thyroid disease   ? URI, acute 03/10/2015  ? UTI (urinary tract infection) 12/14/2016  ? Varicose vein of leg   ? right greater than left  ? ? ?PAST SURGICAL HISTORY: ?  Past Surgical History:  ?Procedure Laterality Date  ? ACETABULAR REVISION Right 10/30/2015  ? Procedure: RIGHT HIP ACETABULAR REVISION /CONSTRAINED LINER (POSTERIOR APPROACH) ;  Surgeon: Gaynelle Arabian, MD;  [facility];  Service: Orthopedics;  Laterality: Right;  ? BREAST EXCISIONAL BIOPSY Right   ? BREAST SURGERY    ? lump removed, and breast reduction  ? EYE SURGERY    ? lasik   2002  ? HIP CLOSED REDUCTION Right 06/30/2015  ? Procedure: CLOSED  MANIPULATION HIP;  Surgeon: Melina Schools, MD;  [facility];  Service: Orthopedics;  Laterality: Right;  ? JOINT REPLACEMENT    ? 3 hips on right and 1 on left  ? METACARPOPHALANGEAL JOINT CAPSULECTOMY / CAPSULOTOMY  1995  ? REDUCTION MAMMAPLASTY    ? SPINAL CORD STIMULATOR INSERTION N/A 12/02/2016  ? Procedure: LUMBAR SPINAL CORD STIMULATOR INSERTION;  Surgeon: Melina Schools, MD;  Location: [facility];  Service: Orthopedics;  Laterality: N/A;  2.5 hrs  ? SPINAL CORD STIMULATOR REMOVAL N/A 12/31/2016  ? Procedure: LUMBAR SPINAL CORD STIMULATOR REMOVAL;  Surgeon: Melina Schools, MD;  Location: [facility];  Service: Orthopedics;  Laterality: N/A;  60 mins  ? TONSILLECTOMY    ? TOTAL HIP ARTHROPLASTY  2011  ? left hip  ? TOTAL HIP ARTHROPLASTY Right 06/12/2015  ? Procedure: RIGHT TOTAL HIP ARTHROPLASTY ANTERIOR APPROACH;  Surgeon: Gaynelle Arabian, MD;  [facility];  Service: Orthopedics;  Laterality: Right;  ? TOTAL HIP REVISION Right 07/19/2015  ? Procedure: RIGHT HIP BEARING SURFACE REVISION;  Surgeon: Gaynelle Arabian, MD;  [facility];  Service: Orthopedics;  Laterality: Right;  ? TUBAL LIGATION  1980  ? left fallopian tube removal  ? ? ?MEDICATIONS: ?Current Outpatient Medications on File Prior to Visit  ?Medication Sig Dispense Refill  ? b complex vitamins tablet Take 1 tablet by mouth daily.    ? cetirizine (ZYRTEC) 10 MG tablet Take 10 mg by mouth daily.     ? cholecalciferol (VITAMIN D) 1000 units tablet Take 1,000 Units by mouth daily.    ? DULoxetine (CYMBALTA) 30 MG capsule Take 1 capsule (30 mg total) by mouth daily. 90 capsule 3  ? DULoxetine (CYMBALTA) 60 MG capsule Take 1 capsule (60 mg total) by mouth daily. 90 capsule 3  ? furosemide (LASIX) 20 MG tablet Take 1 tablet (20 mg total) by mouth daily. 90 tablet 3  ? gabapentin (NEURONTIN) 300 MG capsule Take 1 capsule (300 mg total) by mouth at bedtime. 90 capsule 0  ? Ketotifen Fumarate (ZADITOR OP) Place 1 drop into both eyes 2 (two) times daily as needed  (itchy eyes).    ? levothyroxine (SYNTHROID) 25 MCG tablet Take 1 tablet (25 mcg total) by mouth daily before breakfast. 90 tablet 3  ? liothyronine (CYTOMEL) 25 MCG tablet Take 0.5 tablets (12.5 mcg total) by mouth daily. 45 tablet 1  ? sucralfate (CARAFATE) 1 GM/10ML suspension Take 10 mLs (1 g total) by mouth 4 (four) times daily -  with meals and at bedtime. 420 mL 0  ? ?No current facility-administered medications on file prior to visit.  ? ? ?ALLERGIES: ?Allergies  ?Allergen Reactions  ? Penicillins Hives  ?  Childhood allergy  ?Has patient had a PCN reaction causing immediate rash, facial/tongue/throat swelling, SOB or lightheadedness with hypotension: No ?Has patient had a PCN reaction causing severe rash involving mucus membranes or skin necrosis: No ?Has patient had a PCN reaction that required hospitalization No ?Has patient had a PCN reaction occurring  within the last 10 years: No ?If all of the above answers are "NO", then may proceed with Cephalosporin use.  ? Sulfa Antibiotics Hives  ? ? ?FAMILY HISTORY: ?Family History  ?Problem Relation Age of Onset  ? Arthritis Mother   ? Dementia Mother   ? Mental illness Father   ?     suicide  ? Arthritis Maternal Grandmother   ? Kidney disease Son   ?     b/l multicystic kidney disease s/p 4 transplants  ? Colon cancer Neg Hx   ? ? ?Objective:  ?Blood pressure (!) 147/78, pulse 68, height '5\' 4"'$  (1.626 m), weight 98 lb (44.5 kg), SpO2 97 %. ?General: No acute distress.  Thin.  Patient appears well-groomed.   ?Head:  Normocephalic/atraumatic ?Eyes:  fundi examined but not visualized ?Neck: supple, no paraspinal tenderness, full range of motion ?Back: No paraspinal tenderness ?Heart: regular rate and rhythm ?Lungs: Clear to auscultation bilaterally. ?Vascular: No carotid bruits. ?Neurological Exam: ?Mental status: alert and oriented to person, place, and time, recent and remote memory intact, fund of knowledge intact, attention and concentration intact, speech  fluent and not dysarthric, language intact. ?Cranial nerves: ?CN I: not tested ?CN II: pupils equal, round and reactive to light, visual fields intact ?CN III, IV, VI:  full range of motion, no nystagmus, no pt

## 2021-08-27 ENCOUNTER — Encounter: Payer: Self-pay | Admitting: Neurology

## 2021-08-27 ENCOUNTER — Ambulatory Visit (INDEPENDENT_AMBULATORY_CARE_PROVIDER_SITE_OTHER): Payer: Medicare Other | Admitting: Neurology

## 2021-08-27 ENCOUNTER — Other Ambulatory Visit: Payer: Self-pay

## 2021-08-27 VITALS — BP 147/78 | HR 68 | Ht 64.0 in | Wt 98.0 lb

## 2021-08-27 DIAGNOSIS — M62838 Other muscle spasm: Secondary | ICD-10-CM | POA: Diagnosis not present

## 2021-08-27 DIAGNOSIS — M5412 Radiculopathy, cervical region: Secondary | ICD-10-CM | POA: Diagnosis not present

## 2021-08-27 DIAGNOSIS — R2681 Unsteadiness on feet: Secondary | ICD-10-CM | POA: Diagnosis not present

## 2021-08-27 DIAGNOSIS — G8929 Other chronic pain: Secondary | ICD-10-CM

## 2021-08-27 DIAGNOSIS — M545 Low back pain, unspecified: Secondary | ICD-10-CM | POA: Diagnosis not present

## 2021-08-27 DIAGNOSIS — G4486 Cervicogenic headache: Secondary | ICD-10-CM | POA: Diagnosis not present

## 2021-08-27 MED ORDER — GABAPENTIN 100 MG PO CAPS: 200.0000 mg | ORAL_CAPSULE | Freq: Every morning | ORAL | 5 refills | Status: DC

## 2021-08-27 MED ORDER — DULOXETINE HCL 60 MG PO CPEP: 60.0000 mg | ORAL_CAPSULE | Freq: Every day | ORAL | 3 refills | Status: DC

## 2021-08-27 MED ORDER — GABAPENTIN 300 MG PO CAPS: 300.0000 mg | ORAL_CAPSULE | Freq: Every day | ORAL | 5 refills | Status: DC

## 2021-08-27 MED ORDER — GABAPENTIN 100 MG PO CAPS: 100.0000 mg | ORAL_CAPSULE | Freq: Every morning | ORAL | 5 refills | Status: DC

## 2021-08-27 NOTE — Patient Instructions (Addendum)
Increase gabapentin to '200mg'$  in morning and '300mg'$  at bedtime ?Check nerve conduction study of both legs to evaluate for muscle problems ?Limit use of pain relievers to no more than 2 days out of week to prevent risk of rebound or medication-overuse headache. ?Follow up 5 months. ?

## 2021-09-02 DIAGNOSIS — M542 Cervicalgia: Secondary | ICD-10-CM | POA: Diagnosis not present

## 2021-09-02 DIAGNOSIS — M5416 Radiculopathy, lumbar region: Secondary | ICD-10-CM | POA: Diagnosis not present

## 2021-09-02 DIAGNOSIS — G894 Chronic pain syndrome: Secondary | ICD-10-CM | POA: Diagnosis not present

## 2021-09-02 DIAGNOSIS — Z79899 Other long term (current) drug therapy: Secondary | ICD-10-CM | POA: Diagnosis not present

## 2021-09-02 DIAGNOSIS — Z5181 Encounter for therapeutic drug level monitoring: Secondary | ICD-10-CM | POA: Diagnosis not present

## 2021-09-17 DIAGNOSIS — H35361 Drusen (degenerative) of macula, right eye: Secondary | ICD-10-CM | POA: Diagnosis not present

## 2021-09-17 DIAGNOSIS — D3131 Benign neoplasm of right choroid: Secondary | ICD-10-CM | POA: Diagnosis not present

## 2021-09-17 DIAGNOSIS — H40013 Open angle with borderline findings, low risk, bilateral: Secondary | ICD-10-CM | POA: Diagnosis not present

## 2021-09-17 DIAGNOSIS — H10413 Chronic giant papillary conjunctivitis, bilateral: Secondary | ICD-10-CM | POA: Diagnosis not present

## 2021-09-17 DIAGNOSIS — H2513 Age-related nuclear cataract, bilateral: Secondary | ICD-10-CM | POA: Diagnosis not present

## 2021-09-17 DIAGNOSIS — H0102B Squamous blepharitis left eye, upper and lower eyelids: Secondary | ICD-10-CM | POA: Diagnosis not present

## 2021-09-17 DIAGNOSIS — H0102A Squamous blepharitis right eye, upper and lower eyelids: Secondary | ICD-10-CM | POA: Diagnosis not present

## 2021-09-19 ENCOUNTER — Emergency Department (HOSPITAL_COMMUNITY): Payer: Medicare Other

## 2021-09-19 ENCOUNTER — Encounter (HOSPITAL_COMMUNITY): Payer: Self-pay

## 2021-09-19 ENCOUNTER — Emergency Department (HOSPITAL_COMMUNITY)
Admission: EM | Admit: 2021-09-19 | Discharge: 2021-09-19 | Disposition: A | Discharge status: Home / Self Care | Payer: Medicare Other | Attending: Emergency Medicine | Admitting: Emergency Medicine

## 2021-09-19 DIAGNOSIS — S72114A Nondisplaced fracture of greater trochanter of right femur, initial encounter for closed fracture: Secondary | ICD-10-CM | POA: Insufficient documentation

## 2021-09-19 DIAGNOSIS — M47813 Spondylosis without myelopathy or radiculopathy, cervicothoracic region: Secondary | ICD-10-CM | POA: Diagnosis not present

## 2021-09-19 DIAGNOSIS — S299XXA Unspecified injury of thorax, initial encounter: Secondary | ICD-10-CM | POA: Diagnosis not present

## 2021-09-19 DIAGNOSIS — R9431 Abnormal electrocardiogram [ECG] [EKG]: Secondary | ICD-10-CM | POA: Diagnosis not present

## 2021-09-19 DIAGNOSIS — S79921A Unspecified injury of right thigh, initial encounter: Secondary | ICD-10-CM | POA: Diagnosis present

## 2021-09-19 DIAGNOSIS — S72111A Displaced fracture of greater trochanter of right femur, initial encounter for closed fracture: Secondary | ICD-10-CM

## 2021-09-19 DIAGNOSIS — S199XXA Unspecified injury of neck, initial encounter: Secondary | ICD-10-CM | POA: Diagnosis not present

## 2021-09-19 DIAGNOSIS — Y92002 Bathroom of unspecified non-institutional (private) residence single-family (private) house as the place of occurrence of the external cause: Secondary | ICD-10-CM | POA: Diagnosis not present

## 2021-09-19 DIAGNOSIS — S0990XA Unspecified injury of head, initial encounter: Secondary | ICD-10-CM | POA: Diagnosis not present

## 2021-09-19 DIAGNOSIS — M25551 Pain in right hip: Secondary | ICD-10-CM | POA: Diagnosis not present

## 2021-09-19 DIAGNOSIS — R0781 Pleurodynia: Secondary | ICD-10-CM | POA: Insufficient documentation

## 2021-09-19 DIAGNOSIS — W182XXA Fall in (into) shower or empty bathtub, initial encounter: Secondary | ICD-10-CM | POA: Insufficient documentation

## 2021-09-19 DIAGNOSIS — M4319 Spondylolisthesis, multiple sites in spine: Secondary | ICD-10-CM | POA: Diagnosis not present

## 2021-09-19 DIAGNOSIS — I639 Cerebral infarction, unspecified: Secondary | ICD-10-CM | POA: Diagnosis not present

## 2021-09-19 LAB — BASIC METABOLIC PANEL
Anion gap: 9 (ref 5–15)
BUN: 19 mg/dL (ref 8–23)
CO2: 28 mmol/L (ref 22–32)
Calcium: 9.8 mg/dL (ref 8.9–10.3)
Chloride: 101 mmol/L (ref 98–111)
Creatinine, Ser: 1.17 mg/dL — ABNORMAL HIGH (ref 0.44–1.00)
GFR, Estimated: 48 mL/min — ABNORMAL LOW (ref >60)
Glucose, Bld: 95 mg/dL (ref 70–99)
Potassium: 3.9 mmol/L (ref 3.5–5.1)
Sodium: 138 mmol/L (ref 135–145)

## 2021-09-19 LAB — CBC WITH DIFFERENTIAL/PLATELET
Abs Immature Granulocytes: 0.05 10*3/uL (ref 0.00–0.07)
Basophils Absolute: 0 10*3/uL (ref 0.0–0.1)
Basophils Relative: 0 %
Eosinophils Absolute: 0.1 10*3/uL (ref 0.0–0.5)
Eosinophils Relative: 1 %
HCT: 37.6 % (ref 36.0–46.0)
Hemoglobin: 12.1 g/dL (ref 12.0–15.0)
Immature Granulocytes: 1 %
Lymphocytes Relative: 12 %
Lymphs Abs: 1.1 10*3/uL (ref 0.7–4.0)
MCH: 28.1 pg (ref 26.0–34.0)
MCHC: 32.2 g/dL (ref 30.0–36.0)
MCV: 87.4 fL (ref 80.0–100.0)
Monocytes Absolute: 0.8 10*3/uL (ref 0.1–1.0)
Monocytes Relative: 8 %
Neutro Abs: 7.8 10*3/uL — ABNORMAL HIGH (ref 1.7–7.7)
Neutrophils Relative %: 78 %
Platelets: 233 10*3/uL (ref 150–400)
RBC: 4.3 MIL/uL (ref 3.87–5.11)
RDW: 14.5 % (ref 11.5–15.5)
WBC: 9.9 10*3/uL (ref 4.0–10.5)
nRBC: 0 % (ref 0.0–0.2)

## 2021-09-19 MED ORDER — METHOCARBAMOL 500 MG PO TABS
500.0000 mg | ORAL_TABLET | Freq: Three times a day (TID) | ORAL | 0 refills | Status: DC | PRN
Start: 2021-09-19 — End: 2021-12-30

## 2021-09-19 MED ORDER — LIDOCAINE 5 % EX PTCH: 1.0000 | MEDICATED_PATCH | CUTANEOUS | 0 refills | Status: AC

## 2021-09-19 MED ORDER — OXYCODONE-ACETAMINOPHEN 5-325 MG PO TABS
1.0000 | ORAL_TABLET | Freq: Once | ORAL | Status: AC
  Administered 2021-09-19: 1 via ORAL
  Filled 2021-09-19: qty 1

## 2021-09-19 NOTE — TOC Transition Note (Signed)
Transition of Care [facility]) - CM/SW Discharge Note   Patient Details  Name: Misty Barker MRN: [id number] Date of Birth: [birth year]  [facility] CM/SW Contact:  Roseanne Kaufman, RN Phone Number: 09/19/2021, 3:09 PM   Clinical Narrative:    Patient presents to [facility] ED with c/o of fall this am. RNCM received [facility] consult for HHC: PT.   Patient Goals and CMS Choice    Kathreen Cornfield, BSN, RN, [place], [place] [phone] RNCM spoke with pt and husband at bedside regarding discharge planning for [place]. Offered pt medicare.gov list of home health agencies to choose from.  Pt chose [facility] to render services. Malachy Mood of [facility] notified. Patient made aware that [facility] will be in contact in 24-48 hours.  No DME needs identified at this time.   Discharge Placement   Discharge home with [facility]              Discharge Plan and Services   Expected Discharge Plan: Home with [facility]       Social Determinants of Health (SDOH) Interventions     Readmission Risk Interventions     No data to display

## 2021-09-19 NOTE — ED Triage Notes (Signed)
Pt states she fell at approximately 0315 this morning while going to the bathroom. Pt endorses hitting her head with LOC afterwards. Pt states that she injured her R ribs and R hip. Unable to bear weight since fall.

## 2021-09-19 NOTE — Discharge Instructions (Signed)
For pain control recommend using your previously prescribed hydrocodone as needed.  Can also try the Lidoderm patch.  I also recommend trying the muscle relaxer as needed.  Please follow-up with orthopedic surgery, call their office to request an appointment.  They recommend bearing weight as tolerated.  Recommend resting.  Come back to ER if you are having uncontrollable pain, inability to walk or other new concerning symptom.  Recommend using walker for now.  Please follow-up with physical therapy as well.

## 2021-09-19 NOTE — ED Provider Notes (Signed)
[facility] DEPT Provider Note   CSN: [id number] Arrival date & time: 09/19/21  1123     History {Add pertinent medical, surgical, social history, OB history to HPI:1} Chief Complaint  Patient presents with   Fall   Hip Pain    Misty Barker is a 76 y.o. female.  Presented to the emergency room due to concern for fall, hip pain.  Patient states that she fell in the bathroom, landed on her right hip and right ribs.  Also did hit her head.  She did not have loss consciousness initially but later while she was trying to go to the bathroom did pass out.  Pain is primarily right hip, some in right chest.  She denies any ongoing nausea or vomiting.  Reports prior history of hip replacement by Dr. Ricki Rodriguez.  HPI     Home Medications Prior to Admission medications   Medication Sig Start Date End Date Taking? Authorizing Provider  b complex vitamins tablet Take 1 tablet by mouth daily.    [provider]  cetirizine (ZYRTEC) 10 MG tablet Take 10 mg by mouth daily.     [provider]  cholecalciferol (VITAMIN D) 1000 units tablet Take 1,000 Units by mouth daily.    [provider]  DULoxetine (CYMBALTA) 60 MG capsule Take 1 capsule (60 mg total) by mouth daily. 08/27/21   Mosie Lukes, MD  furosemide (LASIX) 20 MG tablet Take 1 tablet (20 mg total) by mouth daily. 11/07/20   Mosie Lukes, MD  gabapentin (NEURONTIN) 100 MG capsule Take 2 capsules (200 mg total) by mouth in the morning. 08/27/21   Pieter Partridge, DO  gabapentin (NEURONTIN) 300 MG capsule Take 1 capsule (300 mg total) by mouth at bedtime. 08/27/21   Pieter Partridge, DO  Ketotifen Fumarate (ZADITOR OP) Place 1 drop into both eyes 2 (two) times daily as needed (itchy eyes).    [provider]  levothyroxine (SYNTHROID) 25 MCG tablet Take 1 tablet (25 mcg total) by mouth daily before breakfast. 11/07/20   Mosie Lukes, MD  liothyronine (CYTOMEL) 25 MCG  tablet Take 0.5 tablets (12.5 mcg total) by mouth daily. 08/22/21   Mosie Lukes, MD      Allergies    Penicillins and Sulfa antibiotics    Review of Systems   Review of Systems  Constitutional:  Negative for chills and fever.  HENT:  Negative for ear pain and sore throat.   Eyes:  Negative for pain and visual disturbance.  Respiratory:  Negative for cough and shortness of breath.   Cardiovascular:  Negative for chest pain and palpitations.  Gastrointestinal:  Negative for abdominal pain and vomiting.  Genitourinary:  Negative for dysuria and hematuria.  Musculoskeletal:  Positive for arthralgias. Negative for back pain.  Skin:  Negative for color change and rash.  Neurological:  Positive for syncope. Negative for seizures.  All other systems reviewed and are negative.   Physical Exam Updated Vital Signs BP (!) 122/94   Pulse 66   Temp 97.8 F (36.6 C) (Oral)   Resp (!) 21   Ht '5\' 4"'$  (1.626 m)   Wt 44 kg   SpO2 100%   BMI 16.65 kg/m  Physical Exam Vitals and nursing note reviewed.  Constitutional:      General: She is not in acute distress.    Appearance: She is well-developed.  HENT:     Head: Normocephalic and atraumatic.  Eyes:  Conjunctiva/sclera: Conjunctivae normal.  Neck:     Comments: No tenderness to neck Cardiovascular:     Rate and Rhythm: Normal rate and regular rhythm.     Heart sounds: No murmur heard. Pulmonary:     Effort: Pulmonary effort is normal. No respiratory distress.     Breath sounds: Normal breath sounds.  Chest:     Comments: Some tenderness palpation of her right anterior chest wall Abdominal:     Palpations: Abdomen is soft.     Tenderness: There is no abdominal tenderness.  Musculoskeletal:        General: Swelling present.     Cervical back: Neck supple.     Comments: Tenderness palpation to right lateral hip, no tenderness throughout remainder of extremity, normal DP and PT pulses, distal sensation and motor intact No  tenderness palpation throughout the remainder of her extremities, no tenderness to palpation over CT or L-spine  Skin:    General: Skin is warm and dry.     Capillary Refill: Capillary refill takes less than 2 seconds.  Neurological:     General: No focal deficit present.     Mental Status: She is alert.     Comments: Sensation to light touch intact in RLE  Psychiatric:        Mood and Affect: Mood normal.     ED Results / Procedures / Treatments   Labs (all labs ordered are listed, but only abnormal results are displayed) Labs Reviewed  CBC WITH DIFFERENTIAL/PLATELET  BASIC METABOLIC PANEL    EKG None  Radiology DG Hip Unilat W or Wo Pelvis 2-3 Views Right  Result Date: 09/19/2021 CLINICAL DATA:  Fall, right hip pain.  Unable to bear weight. EXAM: DG HIP (WITH OR WITHOUT PELVIS) 2-3V RIGHT COMPARISON:  10/23/2015 and CT scan from 12/10/2016 FINDINGS: A right total hip prosthesis is in place. Acetabular protrusio involving the shell component as shown on 12/10/2016 and also seen on lumbar spine radiography from 09/14/2018. Bony demineralization. Old deformity of the right inferior pubic ramus from healed fracture. There is a transverse avulsion type fracture of the upper portion of the right greater trochanter extending to the upper trochanteric margin of the stem component of the implant. IMPRESSION: 1. Transverse avulsion type fracture of the upper portion of the right greater trochanter extending to the upper lateral corner of the stem component of the hip prosthesis. 2. 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 Electronically Signed   By: Van Clines M.D.   On: 09/19/2021 12:55   DG Ribs Unilateral W/Chest Right  Result Date: 09/19/2021 CLINICAL DATA:  Trauma, fall EXAM: RIGHT RIBS AND CHEST - 3+ VIEW COMPARISON:  Chest radiograph done on 08/17/2017 FINDINGS: AP view of chest is technically less than optimal limiting evaluation of lung  fields. As far as seen, cardiac size is within normal limits. There is no focal pulmonary consolidation. Low position of diaphragms suggests COPD. There is no pleural effusion or pneumothorax. No displaced fractures are seen in the right ribs. IMPRESSION: No displaced fractures are seen in the right ribs. There are no focal pulmonary infiltrates. There is no pleural effusion or pneumothorax. Electronically Signed   By: Elmer Picker M.D.   On: 09/19/2021 12:54    Procedures Procedures  {Document cardiac monitor, telemetry assessment procedure when appropriate:1}  Medications Ordered in ED Medications  oxyCODONE-acetaminophen (PERCOCET/ROXICET) 5-325 MG per tablet 1 tablet (1 tablet Oral Given 09/19/21 1318)    ED Course/  Medical Decision Making/ A&P                           Medical Decision Making Amount and/or Complexity of Data Reviewed Labs: ordered. Radiology: ordered.  Risk Prescription drug management.   ***  {Document critical care time when appropriate:1} {Document review of labs and clinical decision tools ie heart score, Chads2Vasc2 etc:1}  {Document your independent review of radiology images, and any outside records:1} {Document your discussion with family members, caretakers, and with consultants:1} {Document social determinants of health affecting pt's care:1} {Document your decision making why or why not admission, treatments were needed:1} Final Clinical Impression(s) / ED Diagnoses Final diagnoses:  None    Rx / DC Orders ED Discharge Orders     None

## 2021-09-26 ENCOUNTER — Telehealth: Payer: Self-pay | Admitting: Family Medicine

## 2021-09-26 DIAGNOSIS — E039 Hypothyroidism, unspecified: Secondary | ICD-10-CM | POA: Diagnosis not present

## 2021-09-26 DIAGNOSIS — M48061 Spinal stenosis, lumbar region without neurogenic claudication: Secondary | ICD-10-CM | POA: Diagnosis not present

## 2021-09-26 DIAGNOSIS — Z87440 Personal history of urinary (tract) infections: Secondary | ICD-10-CM | POA: Diagnosis not present

## 2021-09-26 DIAGNOSIS — Z96642 Presence of left artificial hip joint: Secondary | ICD-10-CM | POA: Diagnosis not present

## 2021-09-26 DIAGNOSIS — Z96641 Presence of right artificial hip joint: Secondary | ICD-10-CM | POA: Diagnosis not present

## 2021-09-26 DIAGNOSIS — M80051D Age-related osteoporosis with current pathological fracture, right femur, subsequent encounter for fracture with routine healing: Secondary | ICD-10-CM | POA: Diagnosis not present

## 2021-09-26 DIAGNOSIS — G629 Polyneuropathy, unspecified: Secondary | ICD-10-CM | POA: Diagnosis not present

## 2021-09-26 DIAGNOSIS — Z9682 Presence of neurostimulator: Secondary | ICD-10-CM | POA: Diagnosis not present

## 2021-09-26 DIAGNOSIS — H9191 Unspecified hearing loss, right ear: Secondary | ICD-10-CM | POA: Diagnosis not present

## 2021-09-26 DIAGNOSIS — R519 Headache, unspecified: Secondary | ICD-10-CM | POA: Diagnosis not present

## 2021-09-26 DIAGNOSIS — M47812 Spondylosis without myelopathy or radiculopathy, cervical region: Secondary | ICD-10-CM | POA: Diagnosis not present

## 2021-09-26 DIAGNOSIS — I83893 Varicose veins of bilateral lower extremities with other complications: Secondary | ICD-10-CM | POA: Diagnosis not present

## 2021-09-26 DIAGNOSIS — M19042 Primary osteoarthritis, left hand: Secondary | ICD-10-CM | POA: Diagnosis not present

## 2021-09-26 DIAGNOSIS — E785 Hyperlipidemia, unspecified: Secondary | ICD-10-CM | POA: Diagnosis not present

## 2021-09-26 DIAGNOSIS — Z9181 History of falling: Secondary | ICD-10-CM | POA: Diagnosis not present

## 2021-09-26 DIAGNOSIS — E876 Hypokalemia: Secondary | ICD-10-CM | POA: Diagnosis not present

## 2021-09-26 DIAGNOSIS — M19071 Primary osteoarthritis, right ankle and foot: Secondary | ICD-10-CM | POA: Diagnosis not present

## 2021-09-26 DIAGNOSIS — F418 Other specified anxiety disorders: Secondary | ICD-10-CM | POA: Diagnosis not present

## 2021-09-26 DIAGNOSIS — M419 Scoliosis, unspecified: Secondary | ICD-10-CM | POA: Diagnosis not present

## 2021-09-26 DIAGNOSIS — G5601 Carpal tunnel syndrome, right upper limb: Secondary | ICD-10-CM | POA: Diagnosis not present

## 2021-09-26 DIAGNOSIS — M19041 Primary osteoarthritis, right hand: Secondary | ICD-10-CM | POA: Diagnosis not present

## 2021-09-26 DIAGNOSIS — J302 Other seasonal allergic rhinitis: Secondary | ICD-10-CM | POA: Diagnosis not present

## 2021-09-26 DIAGNOSIS — M19072 Primary osteoarthritis, left ankle and foot: Secondary | ICD-10-CM | POA: Diagnosis not present

## 2021-09-26 DIAGNOSIS — D649 Anemia, unspecified: Secondary | ICD-10-CM | POA: Diagnosis not present

## 2021-09-26 NOTE — Telephone Encounter (Signed)
Caller/Agency: Mickel Baas Rocky Morel) Callback Number: [phone], can LVM Requesting OT/PT/Skilled Nursing/Social Work/Speech Therapy: PT Frequency: 2 w 1, 1 w 3, every other week for 4 weeks

## 2021-09-29 NOTE — Telephone Encounter (Signed)
Called and gave verbal orders 

## 2021-09-30 DIAGNOSIS — M19041 Primary osteoarthritis, right hand: Secondary | ICD-10-CM | POA: Diagnosis not present

## 2021-09-30 DIAGNOSIS — M80051D Age-related osteoporosis with current pathological fracture, right femur, subsequent encounter for fracture with routine healing: Secondary | ICD-10-CM | POA: Diagnosis not present

## 2021-09-30 DIAGNOSIS — M19042 Primary osteoarthritis, left hand: Secondary | ICD-10-CM | POA: Diagnosis not present

## 2021-09-30 DIAGNOSIS — R519 Headache, unspecified: Secondary | ICD-10-CM | POA: Diagnosis not present

## 2021-09-30 DIAGNOSIS — Z9181 History of falling: Secondary | ICD-10-CM | POA: Diagnosis not present

## 2021-09-30 DIAGNOSIS — M19071 Primary osteoarthritis, right ankle and foot: Secondary | ICD-10-CM | POA: Diagnosis not present

## 2021-10-02 DIAGNOSIS — R519 Headache, unspecified: Secondary | ICD-10-CM | POA: Diagnosis not present

## 2021-10-02 DIAGNOSIS — Z9181 History of falling: Secondary | ICD-10-CM | POA: Diagnosis not present

## 2021-10-02 DIAGNOSIS — M19071 Primary osteoarthritis, right ankle and foot: Secondary | ICD-10-CM | POA: Diagnosis not present

## 2021-10-02 DIAGNOSIS — M19041 Primary osteoarthritis, right hand: Secondary | ICD-10-CM | POA: Diagnosis not present

## 2021-10-02 DIAGNOSIS — M80051D Age-related osteoporosis with current pathological fracture, right femur, subsequent encounter for fracture with routine healing: Secondary | ICD-10-CM | POA: Diagnosis not present

## 2021-10-02 DIAGNOSIS — M19042 Primary osteoarthritis, left hand: Secondary | ICD-10-CM | POA: Diagnosis not present

## 2021-10-03 DIAGNOSIS — M25551 Pain in right hip: Secondary | ICD-10-CM | POA: Diagnosis not present

## 2021-10-03 DIAGNOSIS — S72114D Nondisplaced fracture of greater trochanter of right femur, subsequent encounter for closed fracture with routine healing: Secondary | ICD-10-CM | POA: Diagnosis not present

## 2021-10-06 DIAGNOSIS — R519 Headache, unspecified: Secondary | ICD-10-CM | POA: Diagnosis not present

## 2021-10-06 DIAGNOSIS — M19071 Primary osteoarthritis, right ankle and foot: Secondary | ICD-10-CM | POA: Diagnosis not present

## 2021-10-06 DIAGNOSIS — Z9181 History of falling: Secondary | ICD-10-CM | POA: Diagnosis not present

## 2021-10-06 DIAGNOSIS — M19042 Primary osteoarthritis, left hand: Secondary | ICD-10-CM | POA: Diagnosis not present

## 2021-10-06 DIAGNOSIS — M80051D Age-related osteoporosis with current pathological fracture, right femur, subsequent encounter for fracture with routine healing: Secondary | ICD-10-CM | POA: Diagnosis not present

## 2021-10-06 DIAGNOSIS — M19041 Primary osteoarthritis, right hand: Secondary | ICD-10-CM | POA: Diagnosis not present

## 2021-10-13 DIAGNOSIS — R519 Headache, unspecified: Secondary | ICD-10-CM | POA: Diagnosis not present

## 2021-10-13 DIAGNOSIS — M19071 Primary osteoarthritis, right ankle and foot: Secondary | ICD-10-CM | POA: Diagnosis not present

## 2021-10-13 DIAGNOSIS — M19042 Primary osteoarthritis, left hand: Secondary | ICD-10-CM | POA: Diagnosis not present

## 2021-10-13 DIAGNOSIS — Z9181 History of falling: Secondary | ICD-10-CM | POA: Diagnosis not present

## 2021-10-13 DIAGNOSIS — M19041 Primary osteoarthritis, right hand: Secondary | ICD-10-CM | POA: Diagnosis not present

## 2021-10-13 DIAGNOSIS — M80051D Age-related osteoporosis with current pathological fracture, right femur, subsequent encounter for fracture with routine healing: Secondary | ICD-10-CM | POA: Diagnosis not present

## 2021-10-15 NOTE — Progress Notes (Signed)
Subjective:    [name], female    DOB: Jan 08, 1946, 76 y.o.   MRN: [id number]  Chief Complaint  Patient presents with   Follow-up    HPI Patient is in today for a follow up on chronic medical concerns. No recent febrile illness or acute hospitalizations. She fell in her home and broke her leg just below the right hip but fortunately was able to manage without surgery. She is improving and has graduated from the walker to a cane. She is noting that her Duloxetine is no longer effective and she would like to try switching back to Wellbutrin which has worked for her in the past. Denies CP/palp/HA/congestion/fevers/GI or GU c/o. Taking meds as prescribed   Past Medical History:  Diagnosis Date   Abdominal pain 02/01/2015   Allergy    "seasonal allergies"   Anemia    Arthritis    osteoarthritis-"DDD" hips. fingers, toes.   Blood transfusion without reported diagnosis    Complication of anesthesia    Depression    Depression with anxiety    Frequent headaches    no problems now.   Hearing loss in right ear 03/10/2015   same over last 6 months   Hip dislocation, right (HCC)    History of chicken pox    Hyperlipidemia    pt. 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    Past Surgical History:  Procedure Laterality Date   ACETABULAR REVISION Right 10/30/2015   Procedure: RIGHT HIP ACETABULAR REVISION /CONSTRAINED LINER (POSTERIOR APPROACH) ;  Surgeon: Gaynelle Arabian, MD;  Location: [facility];  Service: Orthopedics;  Laterality: Right;    BREAST EXCISIONAL BIOPSY Right    BREAST SURGERY     lump removed, and breast reduction   EYE SURGERY     lasik   2002   HIP CLOSED REDUCTION Right 06/30/2015   Procedure: CLOSED MANIPULATION HIP;  Surgeon: Melina Schools, MD;  Location: [facility];  Service: Orthopedics;  Laterality: Right;   JOINT REPLACEMENT     3 hips on right and 1 on left   METACARPOPHALANGEAL JOINT CAPSULECTOMY / CAPSULOTOMY  1995   REDUCTION MAMMAPLASTY     SPINAL CORD STIMULATOR INSERTION N/A 12/02/2016   Procedure: LUMBAR SPINAL CORD STIMULATOR INSERTION;  Surgeon: Melina Schools, MD;  Location: [facility];  Service: Orthopedics;  Laterality: N/A;  2.5 hrs   SPINAL CORD STIMULATOR REMOVAL N/A 12/31/2016   Procedure: LUMBAR SPINAL CORD STIMULATOR REMOVAL;  Surgeon: Melina Schools, MD;  Location: [facility];  Service: Orthopedics;  Laterality: N/A;  60 mins   TONSILLECTOMY     TOTAL HIP ARTHROPLASTY  2011   left hip   TOTAL HIP ARTHROPLASTY Right 06/12/2015   Procedure: RIGHT TOTAL HIP ARTHROPLASTY ANTERIOR APPROACH;  Surgeon: Gaynelle Arabian, MD;  Location: [facility];  Service: Orthopedics;  Laterality: Right;   TOTAL HIP REVISION Right 07/19/2015   Procedure: RIGHT HIP BEARING SURFACE REVISION;  Surgeon: Gaynelle Arabian, MD;  Location: [facility];  Service: Orthopedics;  Laterality: Right;   TUBAL LIGATION  1980   left fallopian tube removal    Family History  Problem Relation Age of Onset   Arthritis Mother    Dementia Mother    Mental illness Father        suicide   Arthritis Maternal Grandmother    Kidney disease Son        b/l multicystic kidney disease s/p 4 transplants   Colon cancer Neg Hx     Social History   Socioeconomic History   Marital status: Married    Spouse name: Not on file   Number of children: Not on file   Years of education: 69   Highest education level: Not on file  Occupational History   Occupation: retired  Tobacco Use   Smoking status: Never   Smokeless tobacco: Never  Vaping Use   Vaping  Use: Never used  Substance and Sexual Activity   Alcohol use: Yes    Alcohol/week: 1.0 standard drink of alcohol    Types: 1 Glasses of wine per week   Drug use: No   Sexual activity: Not on file    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, retired Psychologist, counselling, 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  Other Topics Concern   Not on file  Social History Narrative   Not on file   Social Determinants of Health   Financial Resource Strain: Low Risk  (12/24/2020)   Overall Financial Resource Strain (CARDIA)    Difficulty of Paying Living Expenses: Not hard at all  Food Insecurity: No Food Insecurity (12/24/2020)   Hunger Vital Sign    Worried About Running Out of Food in the Last Year: Never true    Ran Out of Food in the Last Year: Never true  Transportation Needs: No Transportation Needs (12/24/2020)   PRAPARE - Hydrologist (Medical): No    Lack of Transportation (Non-Medical): No  Physical Activity: Insufficiently Active (12/24/2020)   Exercise Vital Sign    Days of Exercise per Week: 4 days    Minutes of Exercise per Session: 30 min  Stress: Stress Concern Present (12/24/2020)   [place]    Feeling of Stress : To some extent  Social Connections: Socially Isolated (12/24/2020)   Social Connection and Isolation Panel [NHANES]    Frequency of Communication with Friends and Family: Once a week    Frequency of Social Gatherings with Friends and Family: Once a week    Attends Religious Services: Never    Marine scientist or Organizations: No    Attends Archivist Meetings: Never    Marital Status: Married  Human resources officer Violence: Not At Risk (12/24/2020)   Humiliation, Afraid, Rape, and Kick questionnaire    Fear of Current or Ex-Partner: No    Emotionally Abused: No    Physically Abused: No    Sexually Abused: No    Outpatient Medications Prior to Visit  Medication Sig Dispense Refill    b complex vitamins tablet Take 1 tablet by mouth daily.     cetirizine (ZYRTEC) 10 MG tablet Take 10 mg by mouth daily.      cholecalciferol (VITAMIN D) 1000 units tablet Take 1,000 Units by mouth daily.     furosemide (LASIX) 20 MG tablet Take 1 tablet (20 mg total) by mouth daily. 90 tablet 3   gabapentin (NEURONTIN) 100 MG capsule Take 2 capsules (200 mg total) by mouth in the morning. [place]  capsule 5   gabapentin (NEURONTIN) 300 MG capsule Take 1 capsule (300 mg total) by mouth at bedtime. 30 capsule 5   Ketotifen Fumarate (ZADITOR OP) Place 1 drop into both eyes 2 (two) times daily as needed (itchy eyes).     levothyroxine (SYNTHROID) 25 MCG tablet Take 1 tablet (25 mcg total) by mouth daily before breakfast. 90 tablet 3   liothyronine (CYTOMEL) 25 MCG tablet Take 0.5 tablets (12.5 mcg total) by mouth daily. 45 tablet 1   methocarbamol (ROBAXIN) 500 MG tablet Take 1 tablet (500 mg total) by mouth every 8 (eight) hours as needed for muscle spasms. 20 tablet 0   DULoxetine (CYMBALTA) 30 MG capsule Take 30 mg by mouth daily.     DULoxetine (CYMBALTA) 60 MG capsule Take 1 capsule (60 mg total) by mouth daily. 90 capsule 3   No facility-administered medications prior to visit.    Allergies  Allergen Reactions   Penicillins Hives    Childhood allergy  Has patient had a PCN reaction causing immediate rash, facial/tongue/throat swelling, SOB or lightheadedness with hypotension: No Has patient had a PCN reaction causing severe rash involving mucus membranes or skin necrosis: No Has patient had a PCN reaction that required hospitalization No Has patient had a PCN reaction occurring within the last 10 years: No If all of the above answers are "NO", then may proceed with Cephalosporin use.   Sulfa Antibiotics Hives    Review of Systems  Constitutional:  Negative for fever and malaise/fatigue.  HENT:  Negative for congestion.   Eyes:  Negative for blurred vision.  Respiratory:  Positive for  shortness of breath.   Cardiovascular:  Negative for chest pain, palpitations and leg swelling.  Gastrointestinal:  Negative for abdominal pain, blood in stool and nausea.  Genitourinary:  Negative for dysuria and frequency.  Musculoskeletal:  Positive for back pain, joint pain and myalgias. Negative for falls.  Skin:  Negative for rash.  Neurological:  Negative for dizziness, loss of consciousness and headaches.  Endo/Heme/Allergies:  Negative for environmental allergies.  Psychiatric/Behavioral:  Positive for depression. The patient is not nervous/anxious.        Objective:    Physical Exam Constitutional:      General: She is not in acute distress.    Appearance: She is well-developed. She is not ill-appearing.     Comments: frail  HENT:     Head: Normocephalic and atraumatic.  Eyes:     Conjunctiva/sclera: Conjunctivae normal.  Neck:     Thyroid: No thyromegaly.  Cardiovascular:     Rate and Rhythm: Normal rate and regular rhythm.     Heart sounds: Normal heart sounds. No murmur heard. Pulmonary:     Effort: Pulmonary effort is normal. No respiratory distress.     Breath sounds: Normal breath sounds.  Abdominal:     General: Bowel sounds are normal. There is no distension.     Palpations: Abdomen is soft. There is no mass.     Tenderness: There is no abdominal tenderness.  Musculoskeletal:     Cervical back: Neck supple.  Lymphadenopathy:     Cervical: No cervical adenopathy.  Skin:    General: Skin is warm and dry.  Neurological:     Mental Status: She is alert and oriented to person, place, and time.  Psychiatric:        Behavior: Behavior normal.     BP 108/70 (BP Location: Left Arm, Patient Position: Sitting, Cuff Size: Normal)   Pulse 74  Resp 20   Ht '5\' 4"'$  (1.626 m)   Wt 93 lb 12.8 oz (42.5 kg)   SpO2 91%   BMI 16.10 kg/m  Wt Readings from Last 3 Encounters:  10/16/21 93 lb 12.8 oz (42.5 kg)  09/19/21 97 lb (44 kg)  08/27/21 98 lb (44.5 kg)     Diabetic Foot Exam - Simple   No data filed    Lab Results  Component Value Date   WBC 7.0 10/16/2021   HGB 13.1 10/16/2021   HCT 40.4 10/16/2021   PLT 319.0 10/16/2021   GLUCOSE 87 10/16/2021   CHOL 280 (H) 10/16/2021   TRIG 102.0 10/16/2021   HDL 82.50 10/16/2021   LDLCALC 177 (H) 10/16/2021   ALT 17 10/16/2021   AST 28 10/16/2021   NA 137 10/16/2021   K 4.4 10/16/2021   CL 97 10/16/2021   CREATININE 1.13 10/16/2021   BUN 15 10/16/2021   CO2 33 (H) 10/16/2021   TSH 0.91 10/16/2021   INR 0.95 10/29/2015    Lab Results  Component Value Date   TSH 0.91 10/16/2021   Lab Results  Component Value Date   WBC 7.0 10/16/2021   HGB 13.1 10/16/2021   HCT 40.4 10/16/2021   MCV 86.5 10/16/2021   PLT 319.0 10/16/2021   Lab Results  Component Value Date   NA 137 10/16/2021   K 4.4 10/16/2021   CO2 33 (H) 10/16/2021   GLUCOSE 87 10/16/2021   BUN 15 10/16/2021   CREATININE 1.13 10/16/2021   BILITOT 0.6 10/16/2021   ALKPHOS 129 (H) 10/16/2021   AST 28 10/16/2021   ALT 17 10/16/2021   PROT 7.0 10/16/2021   ALBUMIN 4.7 10/16/2021   CALCIUM 10.3 10/16/2021   ANIONGAP 9 09/19/2021   GFR 46.94 (L) 10/16/2021   Lab Results  Component Value Date   CHOL 280 (H) 10/16/2021   Lab Results  Component Value Date   HDL 82.50 10/16/2021   Lab Results  Component Value Date   LDLCALC 177 (H) 10/16/2021   Lab Results  Component Value Date   TRIG 102.0 10/16/2021   Lab Results  Component Value Date   CHOLHDL 3 10/16/2021   No results found for: "HGBA1C"     Assessment & Plan:    Problem List Items Addressed This Visit     Depression with anxiety    She has dropped her Duloxetine from 67 to 71 and wishes to titrate off and switch to Wellbutrin which has worked well for her in the past. Start at 150 mg x 4 days then increase to 300 mg daily      Relevant Medications   buPROPion (WELLBUTRIN XL) 150 MG 24 hr tablet   DULoxetine (CYMBALTA) 30 MG capsule    Hyperlipidemia   Relevant Orders   Lipid panel (Completed)   Comprehensive metabolic panel (Completed)   Thyroid disease - Primary    On Levothyroxine, continue to monitor      Relevant Orders   TSH (Completed)   T4, free (Completed)   Right hip pain    She fell in her home and broke her right hip but has been able to manage conservatively and avoid surgery. She is healing well and has graduate from a walker to a cane.       Chronic night sweats   Relevant Orders   CBC (Completed)   Dyspnea    I have discontinued Lennis L. Vogt-Nichols "Connie"'s DULoxetine. I have also changed her DULoxetine. Additionally,  I am having her start on buPROPion. Lastly, I am having her maintain her cetirizine, b complex vitamins, cholecalciferol, Ketotifen Fumarate (ZADITOR OP), furosemide, levothyroxine, liothyronine, gabapentin, gabapentin, and methocarbamol.  Meds ordered this encounter  Medications   buPROPion (WELLBUTRIN XL) 150 MG 24 hr tablet    Sig: 1 tab daily x 4 days then increase to 2 tabs daily    Dispense:  60 tablet    Refill:  2   DULoxetine (CYMBALTA) 30 MG capsule    Sig: Take 1 capsule (30 mg total) by mouth every other day. X  4 weeks

## 2021-10-16 ENCOUNTER — Encounter: Payer: Self-pay | Admitting: Family Medicine

## 2021-10-16 ENCOUNTER — Ambulatory Visit (INDEPENDENT_AMBULATORY_CARE_PROVIDER_SITE_OTHER): Payer: Medicare Other | Admitting: Family Medicine

## 2021-10-16 VITALS — BP 108/70 | HR 74 | Resp 20 | Ht 64.0 in | Wt 93.8 lb

## 2021-10-16 DIAGNOSIS — R61 Generalized hyperhidrosis: Secondary | ICD-10-CM | POA: Diagnosis not present

## 2021-10-16 DIAGNOSIS — R0600 Dyspnea, unspecified: Secondary | ICD-10-CM | POA: Diagnosis not present

## 2021-10-16 DIAGNOSIS — M25551 Pain in right hip: Secondary | ICD-10-CM | POA: Diagnosis not present

## 2021-10-16 DIAGNOSIS — F418 Other specified anxiety disorders: Secondary | ICD-10-CM | POA: Diagnosis not present

## 2021-10-16 DIAGNOSIS — E785 Hyperlipidemia, unspecified: Secondary | ICD-10-CM | POA: Diagnosis not present

## 2021-10-16 DIAGNOSIS — E079 Disorder of thyroid, unspecified: Secondary | ICD-10-CM | POA: Diagnosis not present

## 2021-10-16 LAB — CBC
HCT: 40.4 % (ref 36.0–46.0)
Hemoglobin: 13.1 g/dL (ref 12.0–15.0)
MCHC: 32.4 g/dL (ref 30.0–36.0)
MCV: 86.5 fl (ref 78.0–100.0)
Platelets: 319 10*3/uL (ref 150.0–400.0)
RBC: 4.67 Mil/uL (ref 3.87–5.11)
RDW: 15.3 % (ref 11.5–15.5)
WBC: 7 10*3/uL (ref 4.0–10.5)

## 2021-10-16 LAB — COMPREHENSIVE METABOLIC PANEL
ALT: 17 U/L (ref 0–35)
AST: 28 U/L (ref 0–37)
Albumin: 4.7 g/dL (ref 3.5–5.2)
Alkaline Phosphatase: 129 U/L — ABNORMAL HIGH (ref 39–117)
BUN: 15 mg/dL (ref 6–23)
CO2: 33 mEq/L — ABNORMAL HIGH (ref 19–32)
Calcium: 10.3 mg/dL (ref 8.4–10.5)
Chloride: 97 mEq/L (ref 96–112)
Creatinine, Ser: 1.13 mg/dL (ref 0.40–1.20)
GFR: 46.94 mL/min — ABNORMAL LOW (ref >60.00)
Glucose, Bld: 87 mg/dL (ref 70–99)
Potassium: 4.4 mEq/L (ref 3.5–5.1)
Sodium: 137 mEq/L (ref 135–145)
Total Bilirubin: 0.6 mg/dL (ref 0.2–1.2)
Total Protein: 7 g/dL (ref 6.0–8.3)

## 2021-10-16 LAB — LIPID PANEL
Cholesterol: 280 mg/dL — ABNORMAL HIGH (ref 0–200)
HDL: 82.5 mg/dL (ref >39.00)
LDL Cholesterol: 177 mg/dL — ABNORMAL HIGH (ref 0–99)
NonHDL: 197.51
Total CHOL/HDL Ratio: 3
Triglycerides: 102 mg/dL (ref 0.0–149.0)
VLDL: 20.4 mg/dL (ref 0.0–40.0)

## 2021-10-16 LAB — TSH: TSH: 0.91 u[IU]/mL (ref 0.35–5.50)

## 2021-10-16 LAB — T4, FREE: Free T4: 0.39 ng/dL — ABNORMAL LOW (ref 0.60–1.60)

## 2021-10-16 MED ORDER — DULOXETINE HCL 30 MG PO CPEP: 30.0000 mg | ORAL_CAPSULE | ORAL | Status: DC

## 2021-10-16 MED ORDER — BUPROPION HCL ER (XL) 150 MG PO TB24: ORAL_TABLET | ORAL | 2 refills | Status: DC

## 2021-10-16 NOTE — Patient Instructions (Signed)
Hip Fracture  A hip fracture is a break in the upper part of the thigh bone (femur). This is usually the result of an injury, commonly a fall. What are the causes? This condition may be caused by: A direct hit or injury (trauma) to the side of the hip, such as from a fall or a car accident. What increases the risk? You are more likely to develop this condition if: You have poor balance or an unsteady walking pattern (gait). Certain conditions contribute to poor balance, including Parkinson disease and dementia. You have thinning or weakening of your bones, such as from osteopenia or osteoporosis. You have cancer that spreads to the leg bones. You have certain conditions that can weaken your bones, such as thyroid disorders, intestine disorders, or a lack (deficiency) of certain nutrients. You smoke. You take certain medicines, such as steroids. You have a history of broken bones. What are the signs or symptoms? Symptoms of this condition include: Pain over the injured hip. This is commonly felt on the side of the hip or in the front groin area. Stiffness, bruising, and swelling over the hip. Pain with movement of the leg, especially lifting it up. Pain often gets better with rest. Difficulty or inability to stand, walk, or use the leg to support body weight (put weight on the leg). The leg rolling outward when lying down. The affected leg being shorter than the other leg. How is this diagnosed? This condition may be diagnosed based on: Your symptoms. A physical exam. X-rays. These may be done: To confirm the diagnosis. To determine the type and location of the fracture. To check for other injuries. MRI or CT scans. These may be done if the fracture is not visible on an X-ray. How is this treated? Treatment for this condition depends on the severity and location of your fracture. In most cases, surgery is necessary. Surgery may involve: Repairing the fracture with a screw, nail, or  rod to hold the bone in place (open reduction and internal fixation, ORIF). Replacing the damaged parts of the femur with metal implants (hemiarthroplasty or arthroplasty). If your fracture is less severe, or if you are not eligible for surgery, you may have non-surgical treatment. Non-surgical treatment may involve: Using crutches, a walker, or a wheelchair until your health care provider says that you can support (bear) weight on your hip. Medicines to help reduce pain and swelling. Having regular X-rays to monitor your fracture and make sure that it is healing. Physical therapy. You may need physical therapy after surgery, too. Follow these instructions at home: Activity Do not use your injured leg to support your body weight until your health care provider says that you can. Follow standing and walking restrictions as told by your health care provider. Use crutches, a walker, or a wheelchair as directed. Avoid any activities that cause pain or irritation in your hip. Ask your health care provider what activities are safe for you. Do not drive or use heavy machinery until your health care provider approves. If physical therapy was prescribed, do exercises as told by your health care provider. General instructions  Take over-the-counter and prescription medicines only as told by your health care provider. If directed, put ice on the injured area: Put ice in a plastic bag. Place a towel between your skin and the bag. Leave the ice on for 20 minutes, 2-3 times a day. Do not use any products that contain nicotine or tobacco, such as cigarettes and e-cigarettes. These  can delay bone healing. If you need help quitting, ask your health care provider. Keep all follow-up visits as told by your health care provider. This is important. How is this prevented? To prevent falls at home: Use a cane, walker, or wheelchair as directed. Make sure your rooms and hallways are free of clutter, obstacles,  and cords. Install grab bars in your bedroom and bathrooms. Always use handrails when going up and down stairs. Use nightlights around the house. Exercise regularly. Ask what forms of exercise are safe for you, such as walking and strength and balance exercises. Visit an eye doctor regularly to have your eyesight checked. This can help prevent falls. Make sure you get enough calcium and vitamin D. Do not use any products that contain nicotine or tobacco, such as cigarettes and e-cigarettes. If you need help quitting, ask your health care provider. Limit alcohol use. If you have an underlying condition that caused your hip fracture, work with your health care provider to manage your condition. Contact a health care provider if: Your pain gets worse or it does not get better with rest or medicine. You develop any of the following in your leg or foot: Numbness. Tingling. A change in skin color (discoloration). Skin feeling cold to the touch. Get help right away if: Your pain suddenly gets worse. You cannot move your hip. Summary A hip fracture is a break in the upper part of the thigh bone (femur). Treatment typically requires surgical management to restore stability and function to the hip. Pain medicine and icing of the affected leg can help manage pain and swelling. Follow directions as told by your health care provider. This information is not intended to replace advice given to you by your health care provider. Make sure you discuss any questions you have with your health care provider. Document Revised: 12/01/2019 Document Reviewed: 12/01/2019 Elsevier Patient Education  Misty Barker.

## 2021-10-17 NOTE — Assessment & Plan Note (Signed)
On Levothyroxine, continue to monitor 

## 2021-10-17 NOTE — Assessment & Plan Note (Signed)
She has dropped her Duloxetine from 60 to 30 and wishes to titrate off and switch to Wellbutrin which has worked well for her in the past. Start at 150 mg x 4 days then increase to 300 mg daily

## 2021-10-17 NOTE — Assessment & Plan Note (Signed)
She fell in her home and broke her right hip but has been able to manage conservatively and avoid surgery. She is healing well and has graduate from a walker to a cane.

## 2021-10-20 DIAGNOSIS — M19042 Primary osteoarthritis, left hand: Secondary | ICD-10-CM | POA: Diagnosis not present

## 2021-10-20 DIAGNOSIS — M19071 Primary osteoarthritis, right ankle and foot: Secondary | ICD-10-CM | POA: Diagnosis not present

## 2021-10-20 DIAGNOSIS — M19041 Primary osteoarthritis, right hand: Secondary | ICD-10-CM | POA: Diagnosis not present

## 2021-10-20 DIAGNOSIS — M80051D Age-related osteoporosis with current pathological fracture, right femur, subsequent encounter for fracture with routine healing: Secondary | ICD-10-CM | POA: Diagnosis not present

## 2021-10-20 DIAGNOSIS — Z9181 History of falling: Secondary | ICD-10-CM | POA: Diagnosis not present

## 2021-10-20 DIAGNOSIS — R519 Headache, unspecified: Secondary | ICD-10-CM | POA: Diagnosis not present

## 2021-10-26 DIAGNOSIS — G5601 Carpal tunnel syndrome, right upper limb: Secondary | ICD-10-CM | POA: Diagnosis not present

## 2021-10-26 DIAGNOSIS — M419 Scoliosis, unspecified: Secondary | ICD-10-CM | POA: Diagnosis not present

## 2021-10-26 DIAGNOSIS — M19042 Primary osteoarthritis, left hand: Secondary | ICD-10-CM | POA: Diagnosis not present

## 2021-10-26 DIAGNOSIS — G629 Polyneuropathy, unspecified: Secondary | ICD-10-CM | POA: Diagnosis not present

## 2021-10-26 DIAGNOSIS — M47812 Spondylosis without myelopathy or radiculopathy, cervical region: Secondary | ICD-10-CM | POA: Diagnosis not present

## 2021-10-26 DIAGNOSIS — Z9682 Presence of neurostimulator: Secondary | ICD-10-CM | POA: Diagnosis not present

## 2021-10-26 DIAGNOSIS — Z96641 Presence of right artificial hip joint: Secondary | ICD-10-CM | POA: Diagnosis not present

## 2021-10-26 DIAGNOSIS — M48061 Spinal stenosis, lumbar region without neurogenic claudication: Secondary | ICD-10-CM | POA: Diagnosis not present

## 2021-10-26 DIAGNOSIS — Z87440 Personal history of urinary (tract) infections: Secondary | ICD-10-CM | POA: Diagnosis not present

## 2021-10-26 DIAGNOSIS — Z96642 Presence of left artificial hip joint: Secondary | ICD-10-CM | POA: Diagnosis not present

## 2021-10-26 DIAGNOSIS — I83893 Varicose veins of bilateral lower extremities with other complications: Secondary | ICD-10-CM | POA: Diagnosis not present

## 2021-10-26 DIAGNOSIS — M19072 Primary osteoarthritis, left ankle and foot: Secondary | ICD-10-CM | POA: Diagnosis not present

## 2021-10-26 DIAGNOSIS — M19071 Primary osteoarthritis, right ankle and foot: Secondary | ICD-10-CM | POA: Diagnosis not present

## 2021-10-26 DIAGNOSIS — E785 Hyperlipidemia, unspecified: Secondary | ICD-10-CM | POA: Diagnosis not present

## 2021-10-26 DIAGNOSIS — J302 Other seasonal allergic rhinitis: Secondary | ICD-10-CM | POA: Diagnosis not present

## 2021-10-26 DIAGNOSIS — E876 Hypokalemia: Secondary | ICD-10-CM | POA: Diagnosis not present

## 2021-10-26 DIAGNOSIS — H9191 Unspecified hearing loss, right ear: Secondary | ICD-10-CM | POA: Diagnosis not present

## 2021-10-26 DIAGNOSIS — M80051D Age-related osteoporosis with current pathological fracture, right femur, subsequent encounter for fracture with routine healing: Secondary | ICD-10-CM | POA: Diagnosis not present

## 2021-10-26 DIAGNOSIS — R519 Headache, unspecified: Secondary | ICD-10-CM | POA: Diagnosis not present

## 2021-10-26 DIAGNOSIS — E039 Hypothyroidism, unspecified: Secondary | ICD-10-CM | POA: Diagnosis not present

## 2021-10-26 DIAGNOSIS — F418 Other specified anxiety disorders: Secondary | ICD-10-CM | POA: Diagnosis not present

## 2021-10-26 DIAGNOSIS — Z9181 History of falling: Secondary | ICD-10-CM | POA: Diagnosis not present

## 2021-10-26 DIAGNOSIS — D649 Anemia, unspecified: Secondary | ICD-10-CM | POA: Diagnosis not present

## 2021-10-26 DIAGNOSIS — M19041 Primary osteoarthritis, right hand: Secondary | ICD-10-CM | POA: Diagnosis not present

## 2021-11-04 DIAGNOSIS — M19042 Primary osteoarthritis, left hand: Secondary | ICD-10-CM | POA: Diagnosis not present

## 2021-11-04 DIAGNOSIS — M19071 Primary osteoarthritis, right ankle and foot: Secondary | ICD-10-CM | POA: Diagnosis not present

## 2021-11-04 DIAGNOSIS — R519 Headache, unspecified: Secondary | ICD-10-CM | POA: Diagnosis not present

## 2021-11-04 DIAGNOSIS — M19041 Primary osteoarthritis, right hand: Secondary | ICD-10-CM | POA: Diagnosis not present

## 2021-11-04 DIAGNOSIS — Z9181 History of falling: Secondary | ICD-10-CM | POA: Diagnosis not present

## 2021-11-04 DIAGNOSIS — M80051D Age-related osteoporosis with current pathological fracture, right femur, subsequent encounter for fracture with routine healing: Secondary | ICD-10-CM | POA: Diagnosis not present

## 2021-11-05 DIAGNOSIS — S72114D Nondisplaced fracture of greater trochanter of right femur, subsequent encounter for closed fracture with routine healing: Secondary | ICD-10-CM | POA: Diagnosis not present

## 2021-11-20 DIAGNOSIS — R519 Headache, unspecified: Secondary | ICD-10-CM | POA: Diagnosis not present

## 2021-11-20 DIAGNOSIS — M19042 Primary osteoarthritis, left hand: Secondary | ICD-10-CM | POA: Diagnosis not present

## 2021-11-20 DIAGNOSIS — M19071 Primary osteoarthritis, right ankle and foot: Secondary | ICD-10-CM | POA: Diagnosis not present

## 2021-11-20 DIAGNOSIS — Z9181 History of falling: Secondary | ICD-10-CM | POA: Diagnosis not present

## 2021-11-20 DIAGNOSIS — M80051D Age-related osteoporosis with current pathological fracture, right femur, subsequent encounter for fracture with routine healing: Secondary | ICD-10-CM | POA: Diagnosis not present

## 2021-11-20 DIAGNOSIS — M19041 Primary osteoarthritis, right hand: Secondary | ICD-10-CM | POA: Diagnosis not present

## 2021-11-24 ENCOUNTER — Ambulatory Visit: Payer: Medicare Other | Admitting: Neurology

## 2021-12-04 ENCOUNTER — Other Ambulatory Visit: Payer: Self-pay

## 2021-12-04 MED ORDER — DULOXETINE HCL 30 MG PO CPEP: 30.0000 mg | ORAL_CAPSULE | ORAL | Status: DC

## 2021-12-08 ENCOUNTER — Other Ambulatory Visit: Payer: Self-pay

## 2021-12-08 ENCOUNTER — Encounter: Payer: Self-pay | Admitting: Family Medicine

## 2021-12-08 MED ORDER — FLUOXETINE HCL 10 MG PO CAPS: 10.0000 mg | ORAL_CAPSULE | Freq: Every day | ORAL | 3 refills | Status: DC

## 2021-12-08 NOTE — Telephone Encounter (Signed)
Called pt was advised and Rx was sent in

## 2021-12-18 ENCOUNTER — Other Ambulatory Visit: Payer: Self-pay

## 2021-12-18 MED ORDER — FUROSEMIDE 20 MG PO TABS: 20.0000 mg | ORAL_TABLET | Freq: Every day | ORAL | 3 refills | Status: DC

## 2021-12-22 ENCOUNTER — Encounter: Payer: Self-pay | Admitting: Family Medicine

## 2021-12-22 ENCOUNTER — Ambulatory Visit (INDEPENDENT_AMBULATORY_CARE_PROVIDER_SITE_OTHER): Payer: Medicare Other | Admitting: Neurology

## 2021-12-22 DIAGNOSIS — M62838 Other muscle spasm: Secondary | ICD-10-CM

## 2021-12-22 DIAGNOSIS — R2681 Unsteadiness on feet: Secondary | ICD-10-CM

## 2021-12-22 NOTE — Procedures (Signed)
[facility]  [address], [place]  [place], [place] [address] Tel: [phone] Fax: [phone] Test Date:  12/22/2021  Patient: Misty Barker DOB: July 28, 1945 Physician: Kai Levins, MD  Sex: Female Height: '5\' 4"'$  Ref Phys: Metta Clines, DO  ID#: [id number]   Technician:    History: This is a 76 year old female with numbness and weakness in her legs.  NCV & EMG Findings: This is an extensive electrodiagnostic evaluation of bilateral lower extremities. It shows: Absent sural and superficial peroneal sensory responses bilaterally. Absent H Reflex response bilaterally. Peroneal and tibial motor responses are within normal limits bilaterally. There are chronic motor axon loss changes without accompanying active denervation changes isolated to the left flexor digitorum longus and right rectus femoris muscles.  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 These findings are too limited in degree and distribution for diagnostic purposes but may be residuals of prior radiculopathy.  *The absence of sural and superficial peroneal sensory nerve conduction responses can be normal in a patient of this age. *The absence of an H reflex can be normal in a patient of this age. (>60yo)   ___________________________ Kai Levins    Nerve Conduction Studies Motor Nerve Results    Latency Amplitude F-Lat Segment Distance CV Comment  Site (ms) Norm (mV) Norm (ms)  (cm) (m/s) Norm   Left Fibular (EDB) Motor  Ankle 4.5  < 6.5 3.6  > 2.0        Bel fib head 12.0 - 3.0 -  Bel fib head-Ankle 31 41  > 40   Pop fossa  14.2 - 2.9 -  Pop fossa-Bel fib head 9 41  > 40   Right Fibular (EDB) Motor  Ankle 4.8  < 6.5 2.5  > 2.0        Bel fib head 12.3 - 2.0 -  Bel fib head-Ankle 30.5 41  > 40   Pop fossa 14.0 - 1.81 -  Pop fossa-Bel fib head 7 41  > 40   Left Tibial (AH) Motor  Ankle 5.0  < 5.8 13.5  > 4.0        Knee 14.3 - 8.9 -  Knee-Ankle 38 41  > 40   Right Tibial (AH) Motor  Ankle 4.2  < 5.8 14.9  > 4.0        Knee 12.5 - 10.2 -  Knee-Ankle 36.5 44  > 40    Sensory Sites    Neg Peak Lat Amplitude (O-P) Segment Distance Velocity Comment  Site (ms) Norm (V) Norm  (cm) (ms)   Left Superficial Peroneal Sensory  14 cm-Ankle *NR  < 4.4 *NR  > 6 14 cm-Ankle 14    Right Superficial Peroneal Sensory  14 cm-Ankle *NR  < 4.4 *NR  > 6 14 cm-Ankle 14    Left Sural Sensory  Calf-Lat mall *NR  < 4.4 *NR  > 6 Calf-Lat mall 14    Right Sural Sensory  Calf-Lat mall *NR  < 4.4 *NR  > 6 Calf-Lat mall 14     H-Reflex Results    M-Lat H Lat H Neg Amp H-M Lat  Site (ms) (ms) Norm (mV) (ms)  Left Tibial H-Reflex  Pop fossa --- *NR 23.6.Marland KitchenMarland Kitchen29.8 *NR *NR  Right Tibial H-Reflex  Pop fossa --- *NR 23.6.Marland KitchenMarland Kitchen29.8 *NR *NR   Electromyography   Side Muscle Nerve Root Ins.Act Fibs Fasc Recrt Amp Dur Poly Activation Comment  Left Tib ant Deep per, Peroneal L4-L5 Nml Nml Nml Nml Nml Nml Nml Nml N/A  Left Gastroc MH Tibial S1-S2 Nml Nml Nml Nml Nml Nml Nml *1- N/A  Left FDL Tibial L5-S2 Nml Nml Nml *2- *1+ *1+ Nml *1- N/A  Left EDB Deep per, Peroneal L5-S1 Nml Nml Nml --- --- --- --- NE N/A  Left AH Med plant,  Tibial S1-S2 Nml Nml Nml --- --- --- --- NE N/A  Left Rectus fem Femoral L2-L4 Nml Nml Nml Nml Nml Nml Nml Nml N/A  Left Gluteus med Sup gluteal L5-S1 Nml Nml Nml Nml Nml Nml Nml Nml N/A  Right Tib ant Deep per, Peroneal L4-L5 Nml Nml Nml Nml Nml Nml Nml Nml N/A  Right Gastroc MH Tibial S1-S2 Nml Nml Nml Nml Nml Nml Nml *1- N/A  Right FDL Tibial L5-S2 Nml Nml Nml Nml Nml Nml Nml *1- N/A  Right Rectus fem Femoral  L2-L4 Nml Nml Nml *2- *1+ *1+ *1+ Nml N/A  Right Gluteus med Sup gluteal L5-S1 Nml Nml Nml Nml Nml Nml Nml Nml N/A     Waveforms:  Motor           Sensory           H-Reflex

## 2021-12-25 ENCOUNTER — Other Ambulatory Visit: Payer: Self-pay

## 2021-12-25 ENCOUNTER — Telehealth: Payer: Self-pay | Admitting: Neurology

## 2021-12-25 DIAGNOSIS — G8929 Other chronic pain: Secondary | ICD-10-CM

## 2021-12-25 DIAGNOSIS — M545 Low back pain, unspecified: Secondary | ICD-10-CM

## 2021-12-25 DIAGNOSIS — G4486 Cervicogenic headache: Secondary | ICD-10-CM

## 2021-12-25 DIAGNOSIS — R2681 Unsteadiness on feet: Secondary | ICD-10-CM

## 2021-12-25 DIAGNOSIS — M5412 Radiculopathy, cervical region: Secondary | ICD-10-CM

## 2021-12-25 DIAGNOSIS — M62838 Other muscle spasm: Secondary | ICD-10-CM

## 2021-12-25 NOTE — Telephone Encounter (Signed)
Called back and Left message on machine for patient to call back.  This is in results notes.

## 2021-12-25 NOTE — Telephone Encounter (Signed)
Pt called in and left a message. She is returning a call from [facility]

## 2021-12-29 ENCOUNTER — Other Ambulatory Visit (INDEPENDENT_AMBULATORY_CARE_PROVIDER_SITE_OTHER): Payer: Medicare Other

## 2021-12-29 DIAGNOSIS — M5412 Radiculopathy, cervical region: Secondary | ICD-10-CM

## 2021-12-29 DIAGNOSIS — R2681 Unsteadiness on feet: Secondary | ICD-10-CM | POA: Diagnosis not present

## 2021-12-29 DIAGNOSIS — M62838 Other muscle spasm: Secondary | ICD-10-CM

## 2021-12-29 DIAGNOSIS — G4486 Cervicogenic headache: Secondary | ICD-10-CM | POA: Diagnosis not present

## 2021-12-29 DIAGNOSIS — M545 Low back pain, unspecified: Secondary | ICD-10-CM | POA: Diagnosis not present

## 2021-12-29 DIAGNOSIS — G8929 Other chronic pain: Secondary | ICD-10-CM | POA: Diagnosis not present

## 2021-12-29 NOTE — Assessment & Plan Note (Signed)
Encourage heart healthy diet such as MIND or DASH diet, increase exercise, avoid trans fats, simple carbohydrates and processed foods, consider a krill or fish or flaxseed oil cap daily.  °

## 2021-12-29 NOTE — Assessment & Plan Note (Signed)
Continues to struggle with daily pain. Encouraged moist heat and gentle stretching as tolerated. May try Tylenol and prescription meds as directed and report if symptoms worsen or seek immediate care

## 2021-12-30 ENCOUNTER — Ambulatory Visit (HOSPITAL_BASED_OUTPATIENT_CLINIC_OR_DEPARTMENT_OTHER)
Admission: RE | Admit: 2021-12-30 | Discharge: 2021-12-30 | Disposition: A | Discharge status: Home / Self Care | Payer: Medicare Other | Source: Ambulatory Visit | Attending: Family Medicine | Admitting: Family Medicine

## 2021-12-30 ENCOUNTER — Encounter: Payer: Self-pay | Admitting: Family Medicine

## 2021-12-30 ENCOUNTER — Ambulatory Visit (INDEPENDENT_AMBULATORY_CARE_PROVIDER_SITE_OTHER): Payer: Medicare Other | Admitting: Family Medicine

## 2021-12-30 VITALS — BP 115/72 | HR 63 | Temp 98.0°F | Resp 16 | Ht 64.0 in | Wt 99.0 lb

## 2021-12-30 DIAGNOSIS — R109 Unspecified abdominal pain: Secondary | ICD-10-CM

## 2021-12-30 DIAGNOSIS — Z23 Encounter for immunization: Secondary | ICD-10-CM

## 2021-12-30 DIAGNOSIS — M549 Dorsalgia, unspecified: Secondary | ICD-10-CM | POA: Insufficient documentation

## 2021-12-30 DIAGNOSIS — M48061 Spinal stenosis, lumbar region without neurogenic claudication: Secondary | ICD-10-CM

## 2021-12-30 DIAGNOSIS — K5900 Constipation, unspecified: Secondary | ICD-10-CM

## 2021-12-30 DIAGNOSIS — E785 Hyperlipidemia, unspecified: Secondary | ICD-10-CM

## 2021-12-30 DIAGNOSIS — F418 Other specified anxiety disorders: Secondary | ICD-10-CM

## 2021-12-30 MED ORDER — DULOXETINE HCL 30 MG PO CPEP: 30.0000 mg | ORAL_CAPSULE | Freq: Two times a day (BID) | ORAL | 3 refills | Status: DC

## 2021-12-30 NOTE — Patient Instructions (Signed)
RSV (respiratory syncitial virus) vaccine at pharmacy, Arexvy Covid booster when new version out late September At pharmacy High dose flu shot mid Sept to mid Oct, given today

## 2021-12-30 NOTE — Progress Notes (Signed)
Subjective:   By signing my name below, I, Kellie Simmering, attest that this documentation has been prepared under the direction and in the presence of Mosie Lukes, MD 12/30/2021.     [name], female    DOB: Aug 23, 1945, 76 y.o.   MRN: [id number]  No chief complaint on file.  HPI Patient is in today for an office visit.  Chronic back pain: She complains of chronic back pain. She is currently taking Gabapentin 300 mg and Hydrocodone but says that the pain still lasts.   Bowels: She complains of irregular bowel movements and GI discomfort, stating that some days she has little to no bowel movements.   Dizziness: She complains of dizziness and constant stumbling. She reports that she feels like she is spinning when walking. She further reports that this dizziness has worsened since stopping Duloxetine 60 mg and she is requesting a refill on this medication.   Flu: She is requesting a high-dose flu immunization today.   Swelling: She reports that she is retaining fluids primarily in the ankles, causing them to swell.   Past Medical History:  Diagnosis Date   Abdominal pain 02/01/2015   Allergy    "seasonal allergies"   Anemia    Arthritis    osteoarthritis-"DDD" hips. fingers, toes.   Blood transfusion without reported diagnosis    Complication of anesthesia    Depression    Depression with anxiety    Frequent headaches    no problems now.   Hearing loss in right ear 03/10/2015   same over last 6 months   Hip dislocation, right (HCC)    History of chicken pox    Hyperlipidemia    pt. 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   Past Surgical History:  Procedure Laterality Date   ACETABULAR REVISION Right 10/30/2015   Procedure: RIGHT HIP ACETABULAR REVISION /CONSTRAINED LINER (POSTERIOR APPROACH) ;  Surgeon: Gaynelle Arabian, MD;  Location: [facility];  Service: Orthopedics;  Laterality: Right;   BREAST EXCISIONAL BIOPSY Right    BREAST SURGERY     lump removed, and breast reduction   EYE SURGERY     lasik   2002   HIP CLOSED REDUCTION Right 06/30/2015   Procedure: CLOSED MANIPULATION HIP;  Surgeon: Melina Schools, MD;  Location: [facility];  Service: Orthopedics;  Laterality: Right;   JOINT REPLACEMENT     3 hips on right and 1 on left   METACARPOPHALANGEAL JOINT CAPSULECTOMY / CAPSULOTOMY  1995   REDUCTION MAMMAPLASTY     SPINAL CORD STIMULATOR INSERTION N/A 12/02/2016   Procedure: LUMBAR SPINAL CORD STIMULATOR INSERTION;  Surgeon: Melina Schools, MD;  Location: [place];  Service: Orthopedics;  Laterality: N/A;  2.5 hrs   SPINAL CORD STIMULATOR REMOVAL N/A 12/31/2016   Procedure: LUMBAR SPINAL CORD STIMULATOR REMOVAL;  Surgeon: Melina Schools, MD;  Location: [place];  Service: Orthopedics;  Laterality: N/A;  60 mins   TONSILLECTOMY     TOTAL HIP ARTHROPLASTY  2011  left hip   TOTAL HIP ARTHROPLASTY Right 06/12/2015   Procedure: RIGHT TOTAL HIP ARTHROPLASTY ANTERIOR APPROACH;  Surgeon: Gaynelle Arabian, MD;  Location: [facility];  Service: Orthopedics;  Laterality: Right;   TOTAL HIP REVISION Right 07/19/2015   Procedure: RIGHT HIP BEARING SURFACE REVISION;  Surgeon: Gaynelle Arabian, MD;  Location: [facility];  Service: Orthopedics;  Laterality: Right;   TUBAL LIGATION  1980   left fallopian tube removal   Family History  Problem Relation Age of Onset   Arthritis Mother    Dementia Mother    Mental illness Father        suicide   Arthritis Maternal Grandmother    Kidney disease Son        b/l multicystic kidney disease s/p 4  transplants   Colon cancer Neg Hx    Social History   Socioeconomic History   Marital status: Married    Spouse name: Not on file   Number of children: Not on file   Years of education: 56   Highest education level: Not on file  Occupational History   Occupation: retired  Tobacco Use   Smoking status: Never   Smokeless tobacco: Never  Vaping Use   Vaping Use: Never used  Substance and Sexual Activity   Alcohol use: Yes    Alcohol/week: 1.0 standard drink of alcohol    Types: 1 Glasses of wine per week   Drug use: No   Sexual activity: Not on file    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, retired Psychologist, counselling, 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  Other Topics Concern   Not on file  Social History Narrative   Not on file   Social Determinants of Health   Financial Resource Strain: Low Risk  (12/24/2020)   Overall Financial Resource Strain (CARDIA)    Difficulty of Paying Living Expenses: Not hard at all  Food Insecurity: No Food Insecurity (12/24/2020)   Hunger Vital Sign    Worried About Running Out of Food in the Last Year: Never true    Ran Out of Food in the Last Year: Never true  Transportation Needs: No Transportation Needs (12/24/2020)   PRAPARE - Hydrologist (Medical): No    Lack of Transportation (Non-Medical): No  Physical Activity: Insufficiently Active (12/24/2020)   Exercise Vital Sign    Days of Exercise per Week: 4 days    Minutes of Exercise per Session: 30 min  Stress: Stress Concern Present (12/24/2020)   [place]    Feeling of Stress : To some extent  Social Connections: Socially Isolated (12/24/2020)   Social Connection and Isolation Panel [NHANES]    Frequency of Communication with Friends and Family: Once a week    Frequency of Social Gatherings with Friends and Family: Once a week    Attends Religious Services: Never    Marine scientist or Organizations: No     Attends Archivist Meetings: Never    Marital Status: Married  Human resources officer Violence: Not At Risk (12/24/2020)   Humiliation, Afraid, Rape, and Kick questionnaire    Fear of Current or Ex-Partner: No    Emotionally Abused: No    Physically Abused: No    Sexually Abused: No   Outpatient Medications Prior to Visit  Medication Sig Dispense Refill   b complex vitamins tablet Take 1 tablet by mouth daily.     buPROPion (WELLBUTRIN XL) 150 MG 24 hr tablet 1 tab  daily x 4 days then increase to 2 tabs daily 60 tablet 2   cetirizine (ZYRTEC) 10 MG tablet Take 10 mg by mouth daily.      cholecalciferol (VITAMIN D) 1000 units tablet Take 1,000 Units by mouth daily.     DULoxetine (CYMBALTA) 30 MG capsule Take 1 capsule (30 mg total) by mouth every other day. X  4 weeks     FLUoxetine (PROZAC) 10 MG capsule Take 1 capsule (10 mg total) by mouth daily. 90 capsule 3   furosemide (LASIX) 20 MG tablet Take 1 tablet (20 mg total) by mouth daily. 90 tablet 3   gabapentin (NEURONTIN) 100 MG capsule Take 2 capsules (200 mg total) by mouth in the morning. 60 capsule 5   gabapentin (NEURONTIN) 300 MG capsule Take 1 capsule (300 mg total) by mouth at bedtime. 30 capsule 5   Ketotifen Fumarate (ZADITOR OP) Place 1 drop into both eyes 2 (two) times daily as needed (itchy eyes).     levothyroxine (SYNTHROID) 25 MCG tablet Take 1 tablet (25 mcg total) by mouth daily before breakfast. 90 tablet 3   liothyronine (CYTOMEL) 25 MCG tablet Take 0.5 tablets (12.5 mcg total) by mouth daily. 45 tablet 1   methocarbamol (ROBAXIN) 500 MG tablet Take 1 tablet (500 mg total) by mouth every 8 (eight) hours as needed for muscle spasms. 20 tablet 0   No facility-administered medications prior to visit.   Allergies  Allergen Reactions   Penicillins Hives    Childhood allergy  Has patient had a PCN reaction causing immediate rash, facial/tongue/throat swelling, SOB or lightheadedness with hypotension: No Has patient  had a PCN reaction causing severe rash involving mucus membranes or skin necrosis: No Has patient had a PCN reaction that required hospitalization No Has patient had a PCN reaction occurring within the last 10 years: No If all of the above answers are "NO", then may proceed with Cephalosporin use.   Sulfa Antibiotics Hives   Review of Systems  Musculoskeletal:  Positive for back pain and falls.      Objective:    Physical Exam Constitutional:      General: She is not in acute distress.    Appearance: Normal appearance. She is not ill-appearing.  HENT:     Head: Normocephalic and atraumatic.     Right Ear: External ear normal.     Left Ear: External ear normal.     Mouth/Throat:     Mouth: Mucous membranes are moist.     Pharynx: Oropharynx is clear.  Eyes:     Extraocular Movements: Extraocular movements intact.     Pupils: Pupils are equal, round, and reactive to light.  Cardiovascular:     Rate and Rhythm: Normal rate and regular rhythm.     Pulses: Normal pulses.     Heart sounds: Normal heart sounds. No murmur heard.    No gallop.  Pulmonary:     Effort: Pulmonary effort is normal. No respiratory distress.     Breath sounds: Normal breath sounds. No wheezing or rales.  Abdominal:     General: Bowel sounds are normal.     Tenderness: There is abdominal tenderness.  Skin:    General: Skin is warm and dry.  Neurological:     Mental Status: She is alert and oriented to person, place, and time.  Psychiatric:        Mood and Affect: Mood normal.        Behavior: Behavior normal.  Judgment: Judgment normal.    There were no vitals taken for this visit. Wt Readings from Last 3 Encounters:  10/16/21 93 lb 12.8 oz (42.5 kg)  09/19/21 97 lb (44 kg)  08/27/21 98 lb (44.5 kg)   Diabetic Foot Exam - Simple   No data filed    Lab Results  Component Value Date   WBC 7.0 10/16/2021   HGB 13.1 10/16/2021   HCT 40.4 10/16/2021   PLT 319.0 10/16/2021   GLUCOSE 87  10/16/2021   CHOL 280 (H) 10/16/2021   TRIG 102.0 10/16/2021   HDL 82.50 10/16/2021   LDLCALC 177 (H) 10/16/2021   ALT 17 10/16/2021   AST 28 10/16/2021   NA 137 10/16/2021   K 4.4 10/16/2021   CL 97 10/16/2021   CREATININE 1.13 10/16/2021   BUN 15 10/16/2021   CO2 33 (H) 10/16/2021   TSH 0.91 10/16/2021   INR 0.95 10/29/2015   Lab Results  Component Value Date   TSH 0.91 10/16/2021   Lab Results  Component Value Date   WBC 7.0 10/16/2021   HGB 13.1 10/16/2021   HCT 40.4 10/16/2021   MCV 86.5 10/16/2021   PLT 319.0 10/16/2021   Lab Results  Component Value Date   NA 137 10/16/2021   K 4.4 10/16/2021   CO2 33 (H) 10/16/2021   GLUCOSE 87 10/16/2021   BUN 15 10/16/2021   CREATININE 1.13 10/16/2021   BILITOT 0.6 10/16/2021   ALKPHOS 129 (H) 10/16/2021   AST 28 10/16/2021   ALT 17 10/16/2021   PROT 7.0 10/16/2021   ALBUMIN 4.7 10/16/2021   CALCIUM 10.3 10/16/2021   ANIONGAP 9 09/19/2021   GFR 46.94 (L) 10/16/2021   Lab Results  Component Value Date   CHOL 280 (H) 10/16/2021   Lab Results  Component Value Date   HDL 82.50 10/16/2021   Lab Results  Component Value Date   LDLCALC 177 (H) 10/16/2021   Lab Results  Component Value Date   TRIG 102.0 10/16/2021   Lab Results  Component Value Date   CHOLHDL 3 10/16/2021   No results found for: "HGBA1C"     Assessment & Plan:   Problem List Items Addressed This Visit       Other   Hyperlipidemia   Spinal stenosis of lumbar region   No orders of the defined types were placed in this encounter.  I, Kellie Simmering, personally preformed the services described in this documentation.  All medical record entries made by the scribe were at my direction and in my presence.  I have reviewed the chart and discharge instructions (if applicable) and agree that the record reflects my personal performance and is accurate and complete.12/30/2021  I,Mohammed Iqbal,acting as a scribe for Penni Homans, MD.,have  documented all relevant documentation on the behalf of Penni Homans, MD,as directed by  Penni Homans, MD while in the presence of Penni Homans, MD.  Kellie Simmering

## 2021-12-31 DIAGNOSIS — K5900 Constipation, unspecified: Secondary | ICD-10-CM | POA: Insufficient documentation

## 2021-12-31 LAB — SJOGREN'S SYNDROME ANTIBODS(SSA + SSB)
SSA (Ro) (ENA) Antibody, IgG: <1 AI
SSB (La) (ENA) Antibody, IgG: <1 AI

## 2021-12-31 LAB — ANGIOTENSIN CONVERTING ENZYME: Angiotensin-Converting Enzyme: 21 U/L (ref 9–67)

## 2021-12-31 NOTE — Assessment & Plan Note (Signed)
Developed with medication changes. Encouraged increased hydration and fiber in diet. Daily probiotics. If bowels not moving can use MOM 2 tbls po in 4 oz of warm prune juice by mouth every 2-3 days. If no results then repeat in 4 hours with  Dulcolax suppository pr, may repeat again in 4 more hours as needed. Seek care if symptoms worsen. Consider daily Miralax and/or Dulcolax if symptoms persist. Check abdominal xray and report if worsens

## 2021-12-31 NOTE — Assessment & Plan Note (Signed)
Encouraged moist heat and gentle stretching as tolerated. May try NSAIDs and prescription meds as directed and report if symptoms worsen or seek immediate care, has been flared with recent change in medicine and constipation. She will let us know if pain does not improve with medication adjustments

## 2021-12-31 NOTE — Assessment & Plan Note (Signed)
She has not done well with the switch from Cymbalta to SSRI and Wellbutrin. Will discontinue those and restart Cymbalta

## 2022-01-05 DIAGNOSIS — G894 Chronic pain syndrome: Secondary | ICD-10-CM | POA: Diagnosis not present

## 2022-01-05 DIAGNOSIS — M542 Cervicalgia: Secondary | ICD-10-CM | POA: Diagnosis not present

## 2022-01-05 DIAGNOSIS — M5416 Radiculopathy, lumbar region: Secondary | ICD-10-CM | POA: Diagnosis not present

## 2022-01-05 LAB — IMMUNOFIXATION, SERUM
IgA/Immunoglobulin A, Serum: 222 mg/dL (ref 64–422)
IgG (Immunoglobin G), Serum: 710 mg/dL (ref 586–1602)
IgM (Immunoglobulin M), Srm: 39 mg/dL (ref 26–217)

## 2022-01-15 ENCOUNTER — Other Ambulatory Visit: Payer: Self-pay

## 2022-01-15 MED ORDER — LEVOTHYROXINE SODIUM 25 MCG PO TABS: 25.0000 ug | ORAL_TABLET | Freq: Every day | ORAL | 3 refills | Status: DC

## 2022-01-26 NOTE — Progress Notes (Unsigned)
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  UPDATE: Increased gabapentin to '200mg'$  in AM and '300mg'$  in PM.  ***  NCV-EMG of lower extremities on 12/22/2021 were overall unremarkable.  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     HISTORY: Noticeable since at least 2021.  Tired all of the time.  She is unsteady on her feet.  Not dizziness.  Just off balance and bumps into things.  She has chronic back pain due to lumbar spinal stenosis and hip pain for which she underwent surgeries.  Physical therapy wasn't too effective.  Feels generalized weakness.  Drops things.  Hesitates when stepping onto or off of a curb.  Bottom of her feet feel funny but not numb.  She has occipital and bitemporal headaches radiating from her neck daily.  She also has TMJ dysfunction.  Takes ibuprofen daily.  Treats chronic back and hip pain with hydrocodone.  Right index finger becomes white and cold.  Has night sweats.  Labs from March showed unremarkable  CBC, CMP (with stable GFR 47), TSH/stable T4, Mg 2, CK 142.  She takes a B12 supplement.  MRI of brain on 09/11/2019 personally reviewed showed chronic small vessel ischemic changes and chronic mastoiditis bilaterally but no acute infarct or mass lesion.  MRI of cervical spine on 02/03/2020 personally reviewed revealed multilevel moderate to severe neural foraminal stenosis worse at right C5-6 with mild spinal canal stenosis.  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     Current NSAIDs/analgesics:  ibuprofen, hydrocodone Current antidepressant:  duloxetine '90mg'$  daily Current antiepileptic: gabapentin '100mg'$  in afternoon and gabapentin '300mg'$  at bedtime  PAST MEDICAL HISTORY: Past Medical History:  Diagnosis Date   Abdominal pain 02/01/2015   Allergy    "seasonal allergies"   Anemia    Arthritis    osteoarthritis-"DDD" hips. fingers, toes.   Blood transfusion without reported diagnosis    Complication of anesthesia    Depression    Depression with anxiety    Frequent headaches    no problems now.   Hearing loss in right ear 03/10/2015   same over last 6 months   Hip dislocation, right (HCC)    History of chicken pox    Hyperlipidemia    pt. state no hx   Hypokalemia 02/04/2017   Hypothyroidism    supplement used   Osteoporosis    Peripheral neuropathy 02/01/2015   right hand"carpal tunnel"   PONV (postoperative nausea and vomiting)    Preventative health  care 02/04/2017   Right hip pain 05/24/2015   Scoliosis 02/01/2015   Spinal stenosis of lumbar region 10/23/2016   Thyroid disease    URI, acute 03/10/2015   UTI (urinary tract infection) 12/14/2016   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    MEDICATIONS: Current Outpatient Medications on File Prior to Visit  Medication Sig Dispense Refill   b complex vitamins tablet Take 1 tablet by mouth  daily.     cetirizine (ZYRTEC) 10 MG tablet Take 10 mg by mouth daily.      cholecalciferol (VITAMIN D) 1000 units tablet Take 1,000 Units by mouth daily.     DULoxetine (CYMBALTA) 30 MG capsule Take 1 capsule (30 mg total) by mouth in the morning and at bedtime. 60 capsule 3   furosemide (LASIX) 20 MG tablet Take 1 tablet (20 mg total) by mouth daily. 90 tablet 3   gabapentin (NEURONTIN) 100 MG capsule Take 2 capsules (200 mg total) by mouth in the morning. 60 capsule 5   gabapentin (NEURONTIN) 300 MG capsule Take 1 capsule (300 mg total) by mouth at bedtime. 30 capsule 5   Ketotifen Fumarate (ZADITOR OP) Place 1 drop into both eyes 2 (two) times daily as needed (itchy eyes).     levothyroxine (SYNTHROID) 25 MCG tablet Take 1 tablet (25 mcg total) by mouth daily before breakfast. 90 tablet 3   liothyronine (CYTOMEL) 25 MCG tablet Take 0.5 tablets (12.5 mcg total) by mouth daily. 45 tablet 1   No current facility-administered medications on file prior to visit.    ALLERGIES: Allergies  Allergen Reactions   Penicillins Hives    Childhood allergy  Has patient had a PCN reaction causing immediate rash, facial/tongue/throat swelling, SOB or lightheadedness with hypotension: No Has patient had a PCN reaction causing severe rash involving mucus membranes or skin necrosis: No Has patient had a PCN reaction that required hospitalization No Has patient had a PCN reaction occurring within the last 10 years: No If all of the above answers are "NO", then may proceed with Cephalosporin use.   Sulfa Antibiotics Hives    FAMILY HISTORY: Family History  Problem Relation Age of Onset   Arthritis Mother    Dementia Mother    Mental illness Father        suicide   Arthritis Maternal Grandmother    Kidney disease Son        b/l multicystic kidney disease s/p 4 transplants   Colon cancer Neg Hx       Objective:  *** General: No acute distress.  Patient appears ***-groomed.   Head:   Normocephalic/atraumatic Eyes:  Fundi examined but not visualized Neck: supple, no paraspinal tenderness, full range of motion Heart:  Regular rate and rhythm Lungs:  Clear to auscultation bilaterally Back: No paraspinal tenderness Neurological Exam: alert and oriented to person, place, and time.  Speech fluent and not dysarthric, language intact.  CN II-XII intact. Bulk and tone normal, muscle strength 5/5 throughout.  Sensation to light touch intact.  Deep tendon reflexes 2+ throughout, toes downgoing.  Finger to nose testing intact.  Gait normal, Romberg negative.   Metta Clines, DO  CC: ***

## 2022-01-28 ENCOUNTER — Telehealth (INDEPENDENT_AMBULATORY_CARE_PROVIDER_SITE_OTHER): Payer: Medicare Other | Admitting: Neurology

## 2022-01-28 ENCOUNTER — Encounter: Payer: Self-pay | Admitting: Neurology

## 2022-01-28 VITALS — BP 155/75 | HR 76 | Ht 66.0 in | Wt 99.2 lb

## 2022-01-28 DIAGNOSIS — G8929 Other chronic pain: Secondary | ICD-10-CM

## 2022-01-28 DIAGNOSIS — R2681 Unsteadiness on feet: Secondary | ICD-10-CM | POA: Diagnosis not present

## 2022-01-28 DIAGNOSIS — M545 Low back pain, unspecified: Secondary | ICD-10-CM

## 2022-01-28 DIAGNOSIS — G4486 Cervicogenic headache: Secondary | ICD-10-CM

## 2022-01-28 MED ORDER — GABAPENTIN 100 MG PO CAPS: ORAL_CAPSULE | ORAL | 1 refills | Status: DC

## 2022-01-28 MED ORDER — GABAPENTIN 300 MG PO CAPS: 300.0000 mg | ORAL_CAPSULE | Freq: Every day | ORAL | 1 refills | Status: DC

## 2022-01-28 NOTE — Patient Instructions (Signed)
Change gabapentin to: '100mg'$  in morning, '100mg'$  in afternoon and '300mg'$  at night If no improvement in headaches in 6-8 weeks, contact me  Follow up 4-5 months.

## 2022-02-14 IMAGING — MR MR BRAIN/IAC WO/W CM
11 of 12 series · 40 of 48 positions shown · IV contrast (gadavist)
Comparison: MRI head 04/03/2015

CLINICAL DATA: Diplopia.  Hearing loss.  Ataxia.

EXAM:
MRI HEAD WITHOUT AND WITH CONTRAST
TECHNIQUE: Multiplanar, multiecho pulse sequences of the brain and surrounding
structures were obtained without and with intravenous contrast.
CONTRAST:  4mL GADAVIST GADOBUTROL 1 MMOL/ML IV SOLN

[Series 3: DWI · axial · 3.0mm · 1.20mm/px · z∈[-77,+84]mm · 8 of 54 slices shown]
[im 1/54]
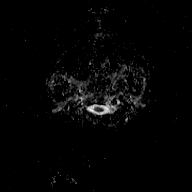
[im 8/54]
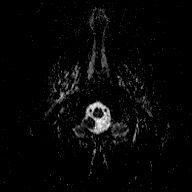
[im 16/54]
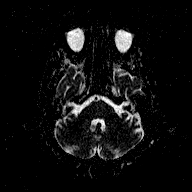
[im 23/54]
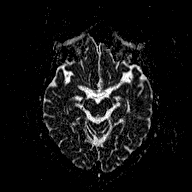
[im 31/54]
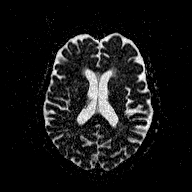
[im 38/54]
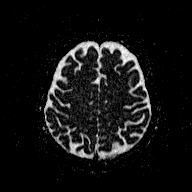
[im 46/54]
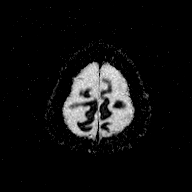
[im 54/54]
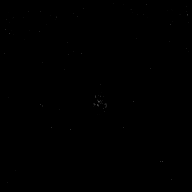

[Series 4: T1 · sagittal · 5.0mm · 0.45mm/px · 4 of 23 slices shown (1 of 3)]
[im 1/23]
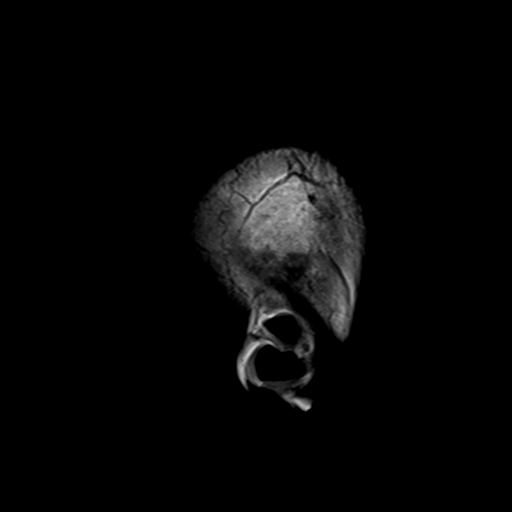
[im 8/23]
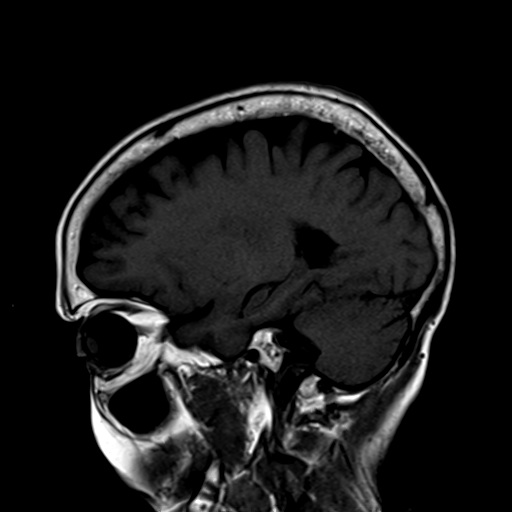
[im 15/23]
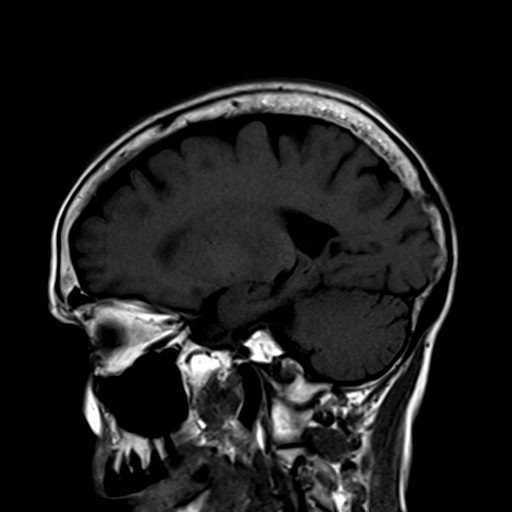
[im 23/23]
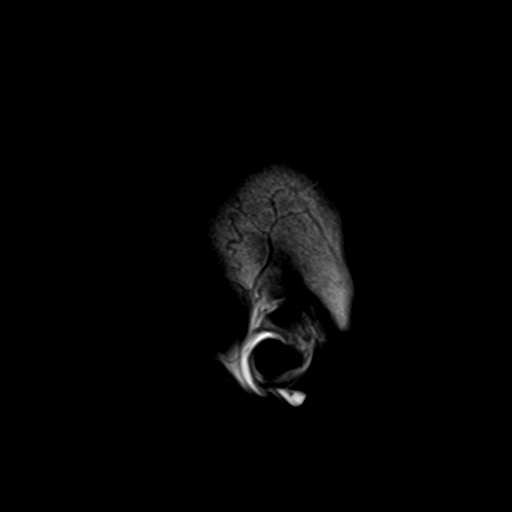

[Series 5: T2 · axial · 5.0mm · 0.72mm/px · z∈[-61,+81]mm · 3 of 23 slices shown (1 of 2)]
[im 1/23]
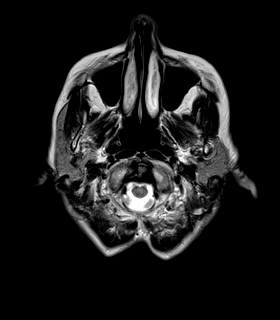
[im 12/23]
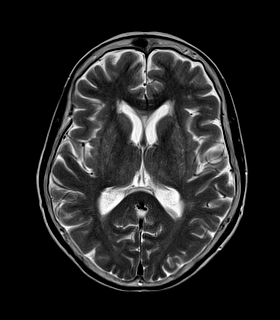
[im 23/23]
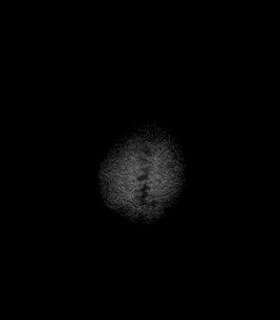

[Series 6: T2 · axial · 5.0mm · 0.72mm/px · z∈[-61,+81]mm · 3 of 23 slices shown (2 of 2)]
[im 1/23]
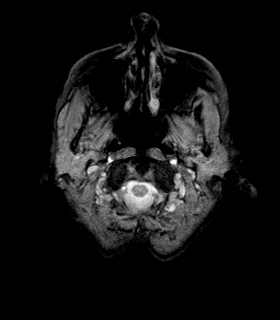
[im 12/23]
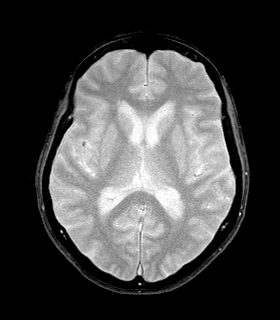
[im 23/23]
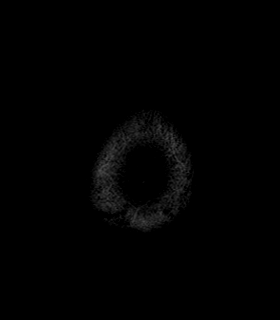

[Series 7: FLAIR · axial · 5.0mm · 0.45mm/px · z∈[-61,+81]mm · 3 of 23 slices shown]
[im 1/23]
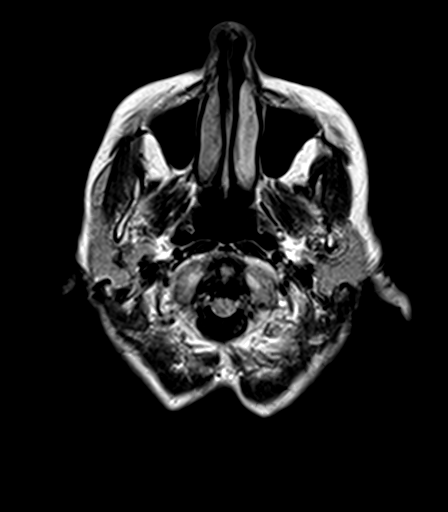
[im 12/23]
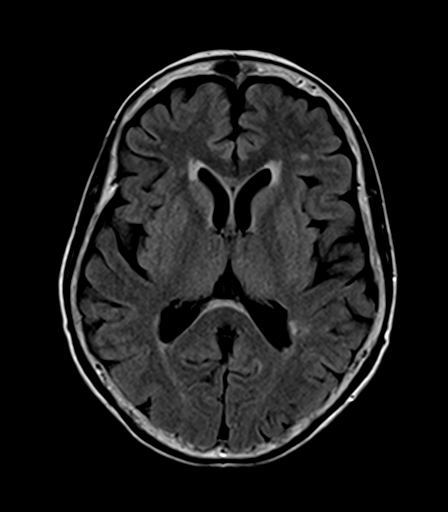
[im 23/23]
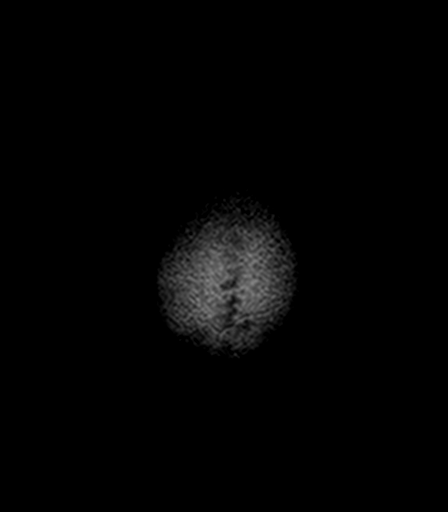

[Series 8: T1 · coronal · 3.0mm · 0.37mm/px · 1 of 11 slices shown (2 of 3)]
[im 1/11]
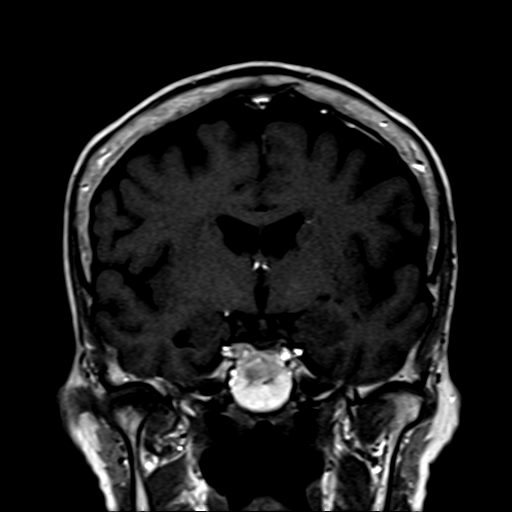

[Series 9: T1 · axial · 3.0mm · 0.37mm/px · 1 of 11 slices shown (3 of 3)]
[im 1/11]
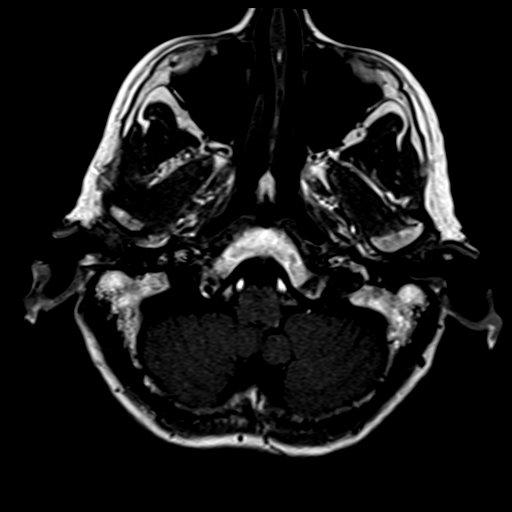

[Series 11: T1 post-contrast · axial · 3.0mm · 0.37mm/px · 1 of 11 slices shown (1 of 3)]
[im 1/11]
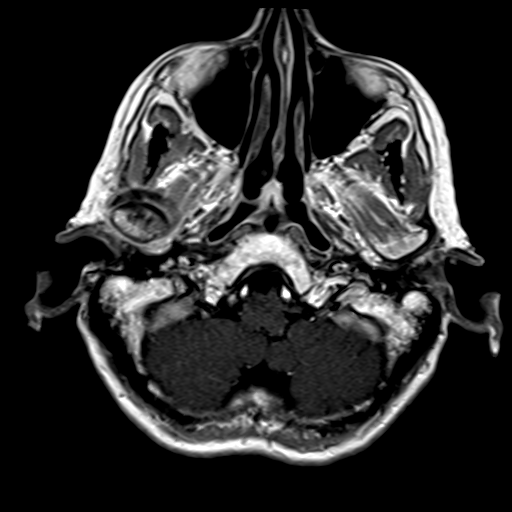

[Series 12: T1 post-contrast · coronal · 3.0mm · 0.37mm/px · 1 of 11 slices shown (2 of 3)]
[im 1/11]
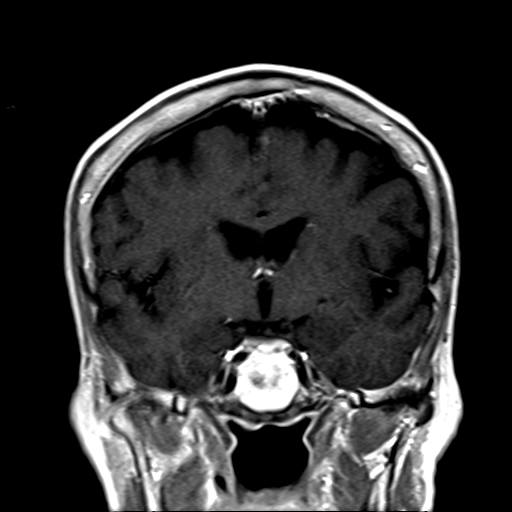

[Series 13: T1 post-contrast · axial · 3.0mm · 1.00mm/px · z∈[-85,+103]mm · 8 of 64 slices shown (3 of 3)]
[im 1/64]
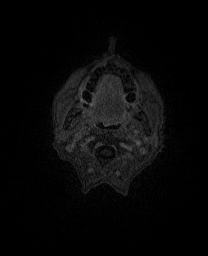
[im 10/64]
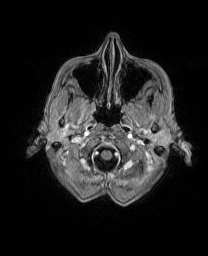
[im 19/64]
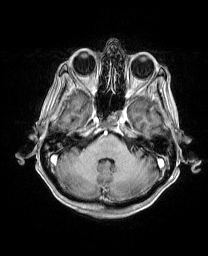
[im 28/64]
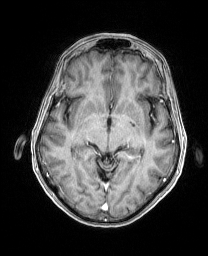
[im 37/64]
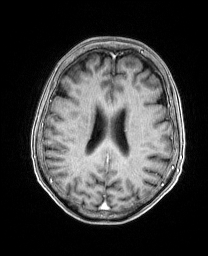
[im 46/64]
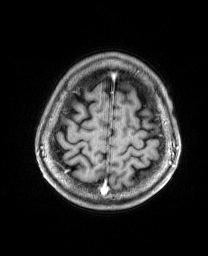
[im 55/64]
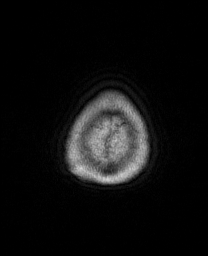
[im 64/64]
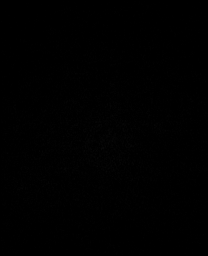

[Series 100: (id) ax · axial · 3.0mm · 1.20mm/px · z∈[-77,+84]mm · 7 of 55 slices shown]
[im 1/55]
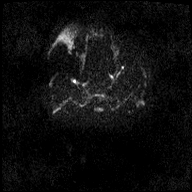
[im 10/55]
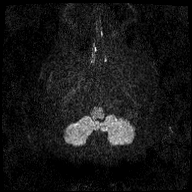
[im 19/55]
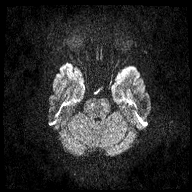
[im 28/55]
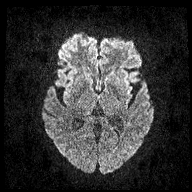
[im 37/55]
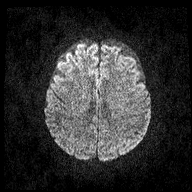
[im 46/55]
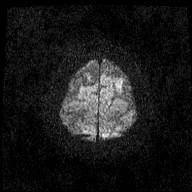
[im 55/55]
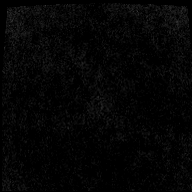

[40 of 48 positions shown; findings below may reference images not displayed]

FINDINGS: Brain: IAC protocol. Seventh and eighth cranial nerves normal.
Negative for vestibular schwannoma. Basilar cisterns normal. Normal
cerebellum. Normal enhancement in the temporal bone bilaterally.
Probable chronic mastoiditis bilaterally. No prior CT for
confirmation.

Ventricle size and cerebral volume normal for age. Negative for
acute infarct. Scattered white matter hyperintensities bilaterally
compatible with mild chronic microvascular ischemia. Mild chronic
ischemic change in the pons. Normal enhancement of the brain
following contrast administration.

Vascular: Normal arterial flow voids.

Skull and upper cervical spine: No focal skeletal lesion.

Sinuses/Orbits: Paranasal sinuses clear.  Normal orbit bilaterally

Other: None
IMPRESSION: No acute abnormality. No cause for hearing loss or ataxia. No mass
lesion identified.

Probable chronic mastoiditis bilaterally.

No acute infarct. Chronic microvascular ischemic change in the white
matter and pons.

## 2022-03-01 NOTE — Assessment & Plan Note (Signed)
Encourage heart healthy diet such as MIND or DASH diet, increase exercise, avoid trans fats, simple carbohydrates and processed foods, consider a krill or fish or flaxseed oil cap daily.  °

## 2022-03-01 NOTE — Assessment & Plan Note (Signed)
Continues to struggle daily pain

## 2022-03-01 NOTE — Assessment & Plan Note (Signed)
On Levothyroxine, continue to monitor 

## 2022-03-01 NOTE — Assessment & Plan Note (Signed)
Hydrate and monitor 

## 2022-03-02 ENCOUNTER — Ambulatory Visit (INDEPENDENT_AMBULATORY_CARE_PROVIDER_SITE_OTHER): Payer: Medicare Other | Admitting: Family Medicine

## 2022-03-02 ENCOUNTER — Other Ambulatory Visit: Payer: Self-pay

## 2022-03-02 ENCOUNTER — Encounter: Payer: Self-pay | Admitting: Family Medicine

## 2022-03-02 VITALS — BP 120/70 | HR 66 | Temp 98.0°F | Resp 16 | Ht 65.0 in | Wt 96.8 lb

## 2022-03-02 DIAGNOSIS — M791 Myalgia, unspecified site: Secondary | ICD-10-CM

## 2022-03-02 DIAGNOSIS — E079 Disorder of thyroid, unspecified: Secondary | ICD-10-CM

## 2022-03-02 DIAGNOSIS — G6289 Other specified polyneuropathies: Secondary | ICD-10-CM

## 2022-03-02 DIAGNOSIS — M48061 Spinal stenosis, lumbar region without neurogenic claudication: Secondary | ICD-10-CM | POA: Diagnosis not present

## 2022-03-02 DIAGNOSIS — F418 Other specified anxiety disorders: Secondary | ICD-10-CM | POA: Diagnosis not present

## 2022-03-02 DIAGNOSIS — E785 Hyperlipidemia, unspecified: Secondary | ICD-10-CM

## 2022-03-02 LAB — LIPID PANEL
Cholesterol: 255 mg/dL — ABNORMAL HIGH (ref 0–200)
HDL: 89.1 mg/dL (ref >39.00)
LDL Cholesterol: 149 mg/dL — ABNORMAL HIGH (ref 0–99)
NonHDL: 165.83
Total CHOL/HDL Ratio: 3
Triglycerides: 84 mg/dL (ref 0.0–149.0)
VLDL: 16.8 mg/dL (ref 0.0–40.0)

## 2022-03-02 LAB — COMPREHENSIVE METABOLIC PANEL
ALT: 18 U/L (ref 0–35)
AST: 25 U/L (ref 0–37)
Albumin: 4.6 g/dL (ref 3.5–5.2)
Alkaline Phosphatase: 46 U/L (ref 39–117)
BUN: 14 mg/dL (ref 6–23)
CO2: 32 mEq/L (ref 19–32)
Calcium: 10 mg/dL (ref 8.4–10.5)
Chloride: 98 mEq/L (ref 96–112)
Creatinine, Ser: 1.08 mg/dL (ref 0.40–1.20)
GFR: 49.43 mL/min — ABNORMAL LOW (ref >60.00)
Glucose, Bld: 83 mg/dL (ref 70–99)
Potassium: 4.5 mEq/L (ref 3.5–5.1)
Sodium: 139 mEq/L (ref 135–145)
Total Bilirubin: 0.5 mg/dL (ref 0.2–1.2)
Total Protein: 6.8 g/dL (ref 6.0–8.3)

## 2022-03-02 LAB — TSH: TSH: 1.47 u[IU]/mL (ref 0.35–5.50)

## 2022-03-02 LAB — CBC
HCT: 40.8 % (ref 36.0–46.0)
Hemoglobin: 13.5 g/dL (ref 12.0–15.0)
MCHC: 33 g/dL (ref 30.0–36.0)
MCV: 84.8 fl (ref 78.0–100.0)
Platelets: 268 10*3/uL (ref 150.0–400.0)
RBC: 4.82 Mil/uL (ref 3.87–5.11)
RDW: 15 % (ref 11.5–15.5)
WBC: 6.8 10*3/uL (ref 4.0–10.5)

## 2022-03-02 MED ORDER — DULOXETINE HCL 30 MG PO CPEP: 30.0000 mg | ORAL_CAPSULE | Freq: Two times a day (BID) | ORAL | 3 refills | Status: DC

## 2022-03-02 MED ORDER — GABAPENTIN 300 MG PO CAPS: 300.0000 mg | ORAL_CAPSULE | Freq: Every day | ORAL | 2 refills | Status: DC

## 2022-03-02 MED ORDER — DULOXETINE HCL 30 MG PO CPEP: 30.0000 mg | ORAL_CAPSULE | Freq: Two times a day (BID) | ORAL | 2 refills | Status: DC

## 2022-03-02 MED ORDER — GABAPENTIN 300 MG PO CAPS: 300.0000 mg | ORAL_CAPSULE | Freq: Every day | ORAL | 1 refills | Status: DC

## 2022-03-02 NOTE — Patient Instructions (Addendum)
Benefiber or Metamucil (psyllium)   Arexvy, RSV or Respiratory Syncitial Virus, vaccine at pharmacy   High Cholesterol  High cholesterol is a condition in which the blood has high levels of a white, waxy substance similar to fat (cholesterol). The liver makes all the cholesterol that the body needs. The human body needs small amounts of cholesterol to help build cells. A person gets extra or excess cholesterol from the food that he or she eats. The blood carries cholesterol from the liver to the rest of the body. If you have high cholesterol, deposits (plaques) may build up on the walls of your arteries. Arteries are the blood vessels that carry blood away from your heart. These plaques make the arteries narrow and stiff. Cholesterol plaques increase your risk for heart attack and stroke. Work with your health care provider to keep your cholesterol levels in a healthy range. What increases the risk? The following factors may make you more likely to develop this condition: Eating foods that are high in animal fat (saturated fat) or cholesterol. Being overweight. Not getting enough exercise. A family history of high cholesterol (familial hypercholesterolemia). Use of tobacco products. Having diabetes. What are the signs or symptoms? In most cases, high cholesterol does not usually cause any symptoms. In severe cases, very high cholesterol levels can cause: Fatty bumps under the skin (xanthomas). A white or gray ring around the black center (pupil) of the eye. How is this diagnosed? This condition may be diagnosed based on the results of a blood test. If you are older than 77 years of age, your health care provider may check your cholesterol levels every 4-6 years. You may be checked more often if you have high cholesterol or other risk factors for heart disease. The blood test for cholesterol measures: "Bad" cholesterol, or LDL cholesterol. This is the main type of cholesterol that causes  heart disease. The desired level is less than 100 mg/dL (2.59 mmol/L). "Good" cholesterol, or HDL cholesterol. HDL helps protect against heart disease by cleaning the arteries and carrying the LDL to the liver for processing. The desired level for HDL is 60 mg/dL (1.55 mmol/L) or higher. Triglycerides. These are fats that your body can store or burn for energy. The desired level is less than 150 mg/dL (1.69 mmol/L). Total cholesterol. This measures the total amount of cholesterol in your blood and includes LDL, HDL, and triglycerides. The desired level is less than 200 mg/dL (5.17 mmol/L). How is this treated? Treatment for high cholesterol starts with lifestyle changes, such as diet and exercise. Diet changes. You may be asked to eat foods that have more fiber and less saturated fats or added sugar. Lifestyle changes. These may include regular exercise, maintaining a healthy weight, and quitting use of tobacco products. Medicines. These are given when diet and lifestyle changes have not worked. You may be prescribed a statin medicine to help lower your cholesterol levels. Follow these instructions at home: Eating and drinking  Eat a healthy, balanced diet. This diet includes: Daily servings of a variety of fresh, frozen, or canned fruits and vegetables. Daily servings of whole grain foods that are rich in fiber. Foods that are low in saturated fats and trans fats. These include poultry and fish without skin, lean cuts of meat, and low-fat dairy products. A variety of fish, especially oily fish that contain omega-3 fatty acids. Aim to eat fish at least 2 times a week. Avoid foods and drinks that have added sugar. Use healthy cooking methods,  such as roasting, grilling, broiling, baking, poaching, steaming, and stir-frying. Do not fry your food except for stir-frying. If you drink alcohol: Limit how much you have to: 0-1 drink a day for women who are not pregnant. 0-2 drinks a day for men. Know  how much alcohol is in a drink. In the [place] one drink equals one 12 oz bottle of beer (355 mL), one 5 oz glass of wine (148 mL), or one 1 oz glass of hard liquor (44 mL). Lifestyle  Get regular exercise. Aim to exercise for a total of 150 minutes a week. Increase your activity level by doing activities such as gardening, walking, and taking the stairs. Do not use any products that contain nicotine or tobacco. These products include cigarettes, chewing tobacco, and vaping devices, such as e-cigarettes. If you need help quitting, ask your health care provider. General instructions Take over-the-counter and prescription medicines only as told by your health care provider. Keep all follow-up visits. This is important. Where to find more information American Heart Association: www.heart.org National Heart, Lung, and Blood Institute: https://wilson-eaton.com/ Contact a health care provider if: You have trouble achieving or maintaining a healthy diet or weight. You are starting an exercise program. You are unable to stop smoking. Get help right away if: You have chest pain. You have trouble breathing. You have discomfort or pain in your jaw, neck, back, shoulder, or arm. You have any symptoms of a stroke. "BE FAST" is an easy way to remember the main warning signs of a stroke: B - Balance. Signs are dizziness, sudden trouble walking, or loss of balance. E - Eyes. Signs are trouble seeing or a sudden change in vision. F - Face. Signs are sudden weakness or numbness of the face, or the face or eyelid drooping on one side. A - Arms. Signs are weakness or numbness in an arm. This happens suddenly and usually on one side of the body. S - Speech. Signs are sudden trouble speaking, slurred speech, or trouble understanding what people say. T - Time. Time to call emergency services. Write down what time symptoms started. You have other signs of a stroke, such as: A sudden, severe headache with no known  cause. Nausea or vomiting. Seizure. These symptoms may represent a serious problem that is an emergency. Do not wait to see if the symptoms will go away. Get medical help right away. Call your local emergency services (911 in the [place] Do not drive yourself to the hospital. Summary Cholesterol plaques increase your risk for heart attack and stroke. Work with your health care provider to keep your cholesterol levels in a healthy range. Eat a healthy, balanced diet, get regular exercise, and maintain a healthy weight. Do not use any products that contain nicotine or tobacco. These products include cigarettes, chewing tobacco, and vaping devices, such as e-cigarettes. Get help right away if you have any symptoms of a stroke. This information is not intended to replace advice given to you by your health care provider. Make sure you discuss any questions you have with your health care provider. Document Revised: 10/31/2021 Document Reviewed: 06/03/2020 Elsevier Patient Education  [place].

## 2022-03-02 NOTE — Assessment & Plan Note (Signed)
Neurology has suggested the addition of Nortriptyline and she is encouraged to proceed but stop it and let us know if she has any concerning side effects

## 2022-03-02 NOTE — Progress Notes (Signed)
Subjective:   By signing my name below, I, Carylon Perches, attest that this documentation has been prepared under the direction and in the presence of Willette Alma MD, 03/02/2022   [name], female    DOB: Aug 20, 1945, 76 y.o.   MRN: [id number]  Chief Complaint  Patient presents with   Follow-up    Follow up    HPI Patient is in today for an office visit  Refills She is requesting a 90 day supply of her 30 mg of Cymbalta and 300 mg of Gabapentin  Injury/Pain She denies of any changes in pain or recent falls.   Nortriptyline She reports that she was seen by Dr.Ramos on 01/05/2022 for her chronic pain symptoms. She was recommended to try Nortriptyline and is wanting an additional opinion from her PCP. She states that she usually eats dinner at 15:00 and bedtime at 21:00. She takes her 100 mg of Gabapentin in the morning and another 100 mg during dinner time. She also takes an additional 300 mg of Gabapentin during the evening. She also takes 30 mg of Cymbalta. She denies of any abnormal fatigue/tiredness at the moment. She states that she was previously taking Fluoxetine which caused her constipation and found that a side effect of Nortriptyline medication was constipation. Therefore, she is concerned about the potential side effects. She is not consistently taking a fiber supplement at the moment.   Cholesterol Her cholesterol levels are elevating. She has not taken a statin or cholesterol medication in the past. She consumes a healthy diet.  Lab Results  Component Value Date   CHOL 280 (H) 10/16/2021   HDL 82.50 10/16/2021   LDLCALC 177 (H) 10/16/2021   TRIG 102.0 10/16/2021   CHOLHDL 3 10/16/2021   Immunizations She reports that she is UTD on her influenza vaccine. She has not received the RSV vaccine.   She reports no recent febrile illness or hospitalizations. Denies CP/ palp/ SOB/ HA/ congestion/fevers/GI or GU c/o. Taking meds as prescribed.    Past Medical History:  Diagnosis Date   Abdominal pain 02/01/2015   Allergy    "seasonal allergies"   Anemia    Arthritis    osteoarthritis-"DDD" hips. fingers, toes.   Blood transfusion without reported diagnosis    Complication of anesthesia    Depression    Depression with anxiety    Frequent headaches    no problems now.   Hearing loss in right ear 03/10/2015   same over last 6 months   Hip dislocation, right (HCC)    History of chicken pox    Hyperlipidemia    pt. 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    Past Surgical History:  Procedure Laterality Date   ACETABULAR REVISION Right 10/30/2015   Procedure: RIGHT HIP ACETABULAR REVISION /CONSTRAINED LINER (POSTERIOR APPROACH) ;  Surgeon: Gaynelle Arabian, MD;  Location: [facility];  Service: Orthopedics;  Laterality: Right;   BREAST EXCISIONAL BIOPSY Right    BREAST SURGERY  lump removed, and breast reduction   EYE SURGERY     lasik   2002   HIP CLOSED REDUCTION Right 06/30/2015   Procedure: CLOSED MANIPULATION HIP;  Surgeon: Melina Schools, MD;  Location: [facility];  Service: Orthopedics;  Laterality: Right;   JOINT REPLACEMENT     3 hips on right and 1 on left   METACARPOPHALANGEAL JOINT CAPSULECTOMY / CAPSULOTOMY  1995   REDUCTION MAMMAPLASTY     SPINAL CORD STIMULATOR INSERTION N/A 12/02/2016   Procedure: LUMBAR SPINAL CORD STIMULATOR INSERTION;  Surgeon: Melina Schools, MD;  Location: [facility];  Service: Orthopedics;  Laterality: N/A;  2.5 hrs   SPINAL CORD STIMULATOR REMOVAL N/A 12/31/2016    Procedure: LUMBAR SPINAL CORD STIMULATOR REMOVAL;  Surgeon: Melina Schools, MD;  Location: [facility];  Service: Orthopedics;  Laterality: N/A;  60 mins   TONSILLECTOMY     TOTAL HIP ARTHROPLASTY  2011   left hip   TOTAL HIP ARTHROPLASTY Right 06/12/2015   Procedure: RIGHT TOTAL HIP ARTHROPLASTY ANTERIOR APPROACH;  Surgeon: Gaynelle Arabian, MD;  Location: [facility];  Service: Orthopedics;  Laterality: Right;   TOTAL HIP REVISION Right 07/19/2015   Procedure: RIGHT HIP BEARING SURFACE REVISION;  Surgeon: Gaynelle Arabian, MD;  Location: [facility];  Service: Orthopedics;  Laterality: Right;   TUBAL LIGATION  1980   left fallopian tube removal    Family History  Problem Relation Age of Onset   Arthritis Mother    Dementia Mother    Mental illness Father        suicide   Arthritis Maternal Grandmother    Kidney disease Son        b/l multicystic kidney disease s/p 4 transplants   Colon cancer Neg Hx     Social History   Socioeconomic History   Marital status: Married    Spouse name: Not on file   Number of children: Not on file   Years of education: 34   Highest education level: Not on file  Occupational History   Occupation: retired  Tobacco Use   Smoking status: Never   Smokeless tobacco: Never  Vaping Use   Vaping Use: Never used  Substance and Sexual Activity   Alcohol use: Yes    Alcohol/week: 1.0 standard drink of alcohol    Types: 1 Glasses of wine per week   Drug use: No   Sexual activity: Not on file    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, retired Psychologist, counselling, 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  Other Topics Concern   Not on file  Social History Narrative   Not on file   Social Determinants of Health   Financial Resource Strain: Low Risk  (12/24/2020)   Overall Financial Resource Strain (CARDIA)    Difficulty of Paying Living Expenses: Not hard at all  Food Insecurity: No Food Insecurity (12/24/2020)   Hunger Vital Sign    Worried About Running Out of Food in the Last Year: Never true     Ran Out of Food in the Last Year: Never true  Transportation Needs: No Transportation Needs (12/24/2020)   PRAPARE - Hydrologist (Medical): No    Lack of Transportation (Non-Medical): No  Physical Activity: Insufficiently Active (12/24/2020)   Exercise Vital Sign    Days of Exercise per Week: 4 days    Minutes of Exercise per Session: 30 min  Stress: Stress Concern Present (12/24/2020)   [place]    Feeling  of Stress : To some extent  Social Connections: Socially Isolated (12/24/2020)   Social Connection and Isolation Panel [NHANES]    Frequency of Communication with Friends and Family: Once a week    Frequency of Social Gatherings with Friends and Family: Once a week    Attends Religious Services: Never    Marine scientist or Organizations: No    Attends Archivist Meetings: Never    Marital Status: Married  Human resources officer Violence: Not At Risk (12/24/2020)   Humiliation, Afraid, Rape, and Kick questionnaire    Fear of Current or Ex-Partner: No    Emotionally Abused: No    Physically Abused: No    Sexually Abused: No    Outpatient Medications Prior to Visit  Medication Sig Dispense Refill   b complex vitamins tablet Take 1 tablet by mouth daily.     cetirizine (ZYRTEC) 10 MG tablet Take 10 mg by mouth daily.      cholecalciferol (VITAMIN D) 1000 units tablet Take 1,000 Units by mouth daily.     furosemide (LASIX) 20 MG tablet Take 1 tablet (20 mg total) by mouth daily. 90 tablet 3   gabapentin (NEURONTIN) 100 MG capsule 1 capsule in morning and 1 capsule in afternoon. 180 capsule 1   Ketotifen Fumarate (ZADITOR OP) Place 1 drop into both eyes 2 (two) times daily as needed (itchy eyes).     levothyroxine (SYNTHROID) 25 MCG tablet Take 1 tablet (25 mcg total) by mouth daily before breakfast. 90 tablet 3   liothyronine (CYTOMEL) 25 MCG tablet Take 0.5 tablets (12.5 mcg  total) by mouth daily. 45 tablet 1   DULoxetine (CYMBALTA) 30 MG capsule Take 1 capsule (30 mg total) by mouth in the morning and at bedtime. 60 capsule 3   gabapentin (NEURONTIN) 300 MG capsule Take 1 capsule (300 mg total) by mouth at bedtime. 90 capsule 1   No facility-administered medications prior to visit.    Allergies  Allergen Reactions   Penicillins Hives    Childhood allergy  Has patient had a PCN reaction causing immediate rash, facial/tongue/throat swelling, SOB or lightheadedness with hypotension: No Has patient had a PCN reaction causing severe rash involving mucus membranes or skin necrosis: No Has patient had a PCN reaction that required hospitalization No Has patient had a PCN reaction occurring within the last 10 years: No If all of the above answers are "NO", then may proceed with Cephalosporin use.   Sulfa Antibiotics Hives    Review of Systems  Constitutional:  Negative for fever.  HENT:  Negative for congestion.   Respiratory:  Negative for shortness of breath.   Cardiovascular:  Negative for chest pain and palpitations.  Gastrointestinal: Negative.   Genitourinary: Negative.   Musculoskeletal:  Negative for falls.  Neurological:  Negative for headaches.       Objective:    Physical Exam Constitutional:      General: She is not in acute distress.    Appearance: Normal appearance. She is not ill-appearing.  HENT:     Head: Normocephalic and atraumatic.     Right Ear: External ear normal.     Left Ear: External ear normal.  Eyes:     Extraocular Movements: Extraocular movements intact.     Conjunctiva/sclera: Conjunctivae normal.     Pupils: Pupils are equal, round, and reactive to light.  Cardiovascular:     Rate and Rhythm: Normal rate and regular rhythm.     Heart sounds: Normal heart sounds.  No murmur heard.    No gallop.  Pulmonary:     Effort: Pulmonary effort is normal. No respiratory distress.     Breath sounds: Normal breath sounds. No  wheezing or rales.  Abdominal:     General: Bowel sounds are normal.     Tenderness: There is no abdominal tenderness.  Musculoskeletal:     Right lower leg: No edema.     Left lower leg: No edema.  Skin:    General: Skin is warm and dry.  Neurological:     Mental Status: She is alert and oriented to person, place, and time.  Psychiatric:        Judgment: Judgment normal.     BP 120/70 (BP Location: Right Arm, Patient Position: Sitting, Cuff Size: Small)   Pulse 66   Temp 98 F (36.7 C) (Oral)   Resp 16   Ht '5\' 5"'$  (1.651 m)   Wt 96 lb 12.8 oz (43.9 kg)   SpO2 (!) 88%   BMI 16.11 kg/m  Wt Readings from Last 3 Encounters:  03/02/22 96 lb 12.8 oz (43.9 kg)  01/28/22 99 lb 3.2 oz (45 kg)  12/30/21 99 lb (44.9 kg)    Diabetic Foot Exam - Simple   No data filed    Lab Results  Component Value Date   WBC 7.0 10/16/2021   HGB 13.1 10/16/2021   HCT 40.4 10/16/2021   PLT 319.0 10/16/2021   GLUCOSE 87 10/16/2021   CHOL 280 (H) 10/16/2021   TRIG 102.0 10/16/2021   HDL 82.50 10/16/2021   LDLCALC 177 (H) 10/16/2021   ALT 17 10/16/2021   AST 28 10/16/2021   NA 137 10/16/2021   K 4.4 10/16/2021   CL 97 10/16/2021   CREATININE 1.13 10/16/2021   BUN 15 10/16/2021   CO2 33 (H) 10/16/2021   TSH 0.91 10/16/2021   INR 0.95 10/29/2015    Lab Results  Component Value Date   TSH 0.91 10/16/2021   Lab Results  Component Value Date   WBC 7.0 10/16/2021   HGB 13.1 10/16/2021   HCT 40.4 10/16/2021   MCV 86.5 10/16/2021   PLT 319.0 10/16/2021   Lab Results  Component Value Date   NA 137 10/16/2021   K 4.4 10/16/2021   CO2 33 (H) 10/16/2021   GLUCOSE 87 10/16/2021   BUN 15 10/16/2021   CREATININE 1.13 10/16/2021   BILITOT 0.6 10/16/2021   ALKPHOS 129 (H) 10/16/2021   AST 28 10/16/2021   ALT 17 10/16/2021   PROT 7.0 10/16/2021   ALBUMIN 4.7 10/16/2021   CALCIUM 10.3 10/16/2021   ANIONGAP 9 09/19/2021   GFR 46.94 (L) 10/16/2021   Lab Results  Component Value  Date   CHOL 280 (H) 10/16/2021   Lab Results  Component Value Date   HDL 82.50 10/16/2021   Lab Results  Component Value Date   LDLCALC 177 (H) 10/16/2021   Lab Results  Component Value Date   TRIG 102.0 10/16/2021   Lab Results  Component Value Date   CHOLHDL 3 10/16/2021   No results found for: "HGBA1C"     Assessment & Plan:   Problem List Items Addressed This Visit     Depression with anxiety    Continue Duloxetine 30 mg bid and is considering adding Nortriptyline per neurology and she is encouraged to proceed      Relevant Medications   DULoxetine (CYMBALTA) 30 MG capsule   Hyperlipidemia - Primary    Encourage heart  healthy diet such as MIND or DASH diet, increase exercise, avoid trans fats, simple carbohydrates and processed foods, consider a krill or fish or flaxseed oil cap daily.        Relevant Orders   Comprehensive metabolic panel   Lipid panel   Thyroid disease    On Levothyroxine, continue to monitor       Relevant Orders   TSH   Peripheral neuropathy    Neurology has suggested the addition of Nortriptyline and she is encouraged to proceed but stop it and let us know if she has any concerning side effects      Relevant Medications   gabapentin (NEURONTIN) 300 MG capsule   DULoxetine (CYMBALTA) 30 MG capsule   Spinal stenosis of lumbar region    Continues to struggle daily pain      Myalgia    Hydrate and monitor       Relevant Orders   CBC   Meds ordered this encounter  Medications   gabapentin (NEURONTIN) 300 MG capsule    Sig: Take 1 capsule (300 mg total) by mouth at bedtime.    Dispense:  90 capsule    Refill:  2   DULoxetine (CYMBALTA) 30 MG capsule    Sig: Take 1 capsule (30 mg total) by mouth in the morning and at bedtime.    Dispense:  180 capsule    Refill:  2    I, Penni Homans, MD, personally preformed the services described in this documentation.  All medical record entries made by the scribe were at my direction  and in my presence.  I have reviewed the chart and discharge instructions (if applicable) and agree that the record reflects my personal performance and is accurate and complete. 03/02/2022    I,Amber Collins,acting as a scribe for Penni Homans, MD.,have documented all relevant documentation on the behalf of Penni Homans, MD,as directed by  Penni Homans, MD while in the presence of Penni Homans, MD.   Penni Homans, MD

## 2022-03-02 NOTE — Assessment & Plan Note (Signed)
Continue Duloxetine 30 mg bid and is considering adding Nortriptyline per neurology and she is encouraged to proceed

## 2022-03-03 ENCOUNTER — Other Ambulatory Visit: Payer: Self-pay | Admitting: Family Medicine

## 2022-03-03 MED ORDER — NORTRIPTYLINE HCL 10 MG PO CAPS: 10.0000 mg | ORAL_CAPSULE | Freq: Every day | ORAL | 3 refills | Status: DC

## 2022-03-17 ENCOUNTER — Encounter: Payer: Self-pay | Admitting: Family Medicine

## 2022-03-23 ENCOUNTER — Other Ambulatory Visit: Payer: Self-pay | Admitting: Family Medicine

## 2022-03-23 MED ORDER — BUPROPION HCL ER (XL) 150 MG PO TB24: 150.0000 mg | ORAL_TABLET | Freq: Two times a day (BID) | ORAL | 3 refills | Status: DC

## 2022-03-23 MED ORDER — DULOXETINE HCL 20 MG PO CPEP: 20.0000 mg | ORAL_CAPSULE | Freq: Every day | ORAL | 3 refills | Status: DC

## 2022-05-05 DIAGNOSIS — G894 Chronic pain syndrome: Secondary | ICD-10-CM | POA: Diagnosis not present

## 2022-05-05 DIAGNOSIS — M62830 Muscle spasm of back: Secondary | ICD-10-CM | POA: Diagnosis not present

## 2022-05-05 DIAGNOSIS — M5416 Radiculopathy, lumbar region: Secondary | ICD-10-CM | POA: Diagnosis not present

## 2022-05-21 ENCOUNTER — Ambulatory Visit (INDEPENDENT_AMBULATORY_CARE_PROVIDER_SITE_OTHER): Payer: Medicare Other | Admitting: Family Medicine

## 2022-05-21 VITALS — BP 130/74 | HR 63 | Temp 97.5°F | Resp 16 | Ht 65.0 in | Wt 98.4 lb

## 2022-05-21 DIAGNOSIS — M549 Dorsalgia, unspecified: Secondary | ICD-10-CM | POA: Diagnosis not present

## 2022-05-21 DIAGNOSIS — R55 Syncope and collapse: Secondary | ICD-10-CM

## 2022-05-21 DIAGNOSIS — H9319 Tinnitus, unspecified ear: Secondary | ICD-10-CM | POA: Diagnosis not present

## 2022-05-21 DIAGNOSIS — R42 Dizziness and giddiness: Secondary | ICD-10-CM | POA: Diagnosis not present

## 2022-05-21 MED ORDER — BUSPIRONE HCL 5 MG PO TABS: 5.0000 mg | ORAL_TABLET | Freq: Three times a day (TID) | ORAL | 2 refills | Status: DC

## 2022-05-21 MED ORDER — LORAZEPAM 0.5 MG PO TABS: 0.5000 mg | ORAL_TABLET | Freq: Two times a day (BID) | ORAL | 1 refills | Status: DC | PRN

## 2022-05-21 NOTE — Patient Instructions (Signed)

## 2022-05-21 NOTE — Progress Notes (Signed)
Subjective:   By signing my name below, I, Kellie Simmering, attest that this documentation has been prepared under the direction and in the presence of Mosie Lukes, MD., 05/21/2022.  [name], female    DOB: 1946-01-09, 77 y.o.   MRN: [id number]  Chief Complaint  Patient presents with   Follow-up    Follow up   HPI Patient is in today for an office visit. She denies CP/palpitations/SOB/HA/congestion/ fever/chills/GI or GU symptoms.  Anxiety/Stress Patient's anxiety/stress has increased in recent months due to family problems and she is interested in taking a more potent antidepressant medication. She states her anxiety/stress is primarily due to her son's bilateral multicystic dysplastic kidney disease and his pessimistic attitude to life. In January she weaned off Duloxetine 20 mg and in February she weaned off Bupropion 150 mg. She has not taken Nortriptyline 10 mg in several months.   Vertigo/Presyncope Patient reports that she has been experiencing imbalance and whooshing sounds in her head for the past month. Denies headaches. She further reports that she experienced presyncope several days ago while watering her plants. She sleeps well and continues taking Gabapentin 300 mg and a muscle relaxer nightly.  Past Medical History:  Diagnosis Date   Abdominal pain 02/01/2015   Allergy    "seasonal allergies"   Anemia    Arthritis    osteoarthritis-"DDD" hips. fingers, toes.   Blood transfusion without reported diagnosis    Complication of anesthesia    Depression    Depression with anxiety    Frequent headaches    no problems now.   Hearing loss in right ear 03/10/2015   same over last 6 months   Hip dislocation, right (HCC)    History of chicken pox    Hyperlipidemia    pt. 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    Past Surgical History:  Procedure Laterality Date   ACETABULAR REVISION Right 10/30/2015   Procedure: RIGHT HIP ACETABULAR REVISION /CONSTRAINED LINER (POSTERIOR APPROACH) ;  Surgeon: Gaynelle Arabian, MD;  Location: [facility];  Service: Orthopedics;  Laterality: Right;   BREAST EXCISIONAL BIOPSY Right    BREAST SURGERY     lump removed, and breast reduction   EYE SURGERY     lasik   2002   HIP CLOSED REDUCTION Right 06/30/2015   Procedure: CLOSED MANIPULATION HIP;  Surgeon: Melina Schools, MD;  Location: [facility];  Service: Orthopedics;  Laterality: Right;   JOINT REPLACEMENT     3 hips on right and 1 on left   METACARPOPHALANGEAL JOINT CAPSULECTOMY / CAPSULOTOMY  1995   REDUCTION MAMMAPLASTY     SPINAL CORD STIMULATOR INSERTION N/A 12/02/2016   Procedure: LUMBAR SPINAL CORD STIMULATOR INSERTION;  Surgeon: Melina Schools, MD;  Location: [place];  Service: Orthopedics;  Laterality: N/A;  2.5 hrs   SPINAL CORD STIMULATOR REMOVAL N/A 12/31/2016   Procedure: LUMBAR SPINAL CORD STIMULATOR REMOVAL;  Surgeon: Melina Schools, MD;  Location: [place];  Service: Orthopedics;  Laterality:  N/A;  60 mins   TONSILLECTOMY     TOTAL HIP ARTHROPLASTY  2011   left hip   TOTAL HIP ARTHROPLASTY Right 06/12/2015   Procedure: RIGHT TOTAL HIP ARTHROPLASTY ANTERIOR APPROACH;  Surgeon: Gaynelle Arabian, MD;  Location: [facility];  Service: Orthopedics;  Laterality: Right;   TOTAL HIP REVISION Right 07/19/2015   Procedure: RIGHT HIP BEARING SURFACE REVISION;  Surgeon: Gaynelle Arabian, MD;  Location: [facility];  Service: Orthopedics;  Laterality: Right;   TUBAL LIGATION  1980   left fallopian tube removal    Family History  Problem  Relation Age of Onset   Arthritis Mother    Dementia Mother    Mental illness Father        suicide   Arthritis Maternal Grandmother    Kidney disease Son        b/l multicystic kidney disease s/p 4 transplants   Colon cancer Neg Hx     Social History   Socioeconomic History   Marital status: Married    Spouse name: Not on file   Number of children: Not on file   Years of education: 37   Highest education level: Not on file  Occupational History   Occupation: retired  Tobacco Use   Smoking status: Never   Smokeless tobacco: Never  Vaping Use   Vaping Use: Never used  Substance and Sexual Activity   Alcohol use: Yes    Alcohol/week: 1.0 standard drink of alcohol    Types: 1 Glasses of wine per week   Drug use: No   Sexual activity: Not on file    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, retired Psychologist, counselling, 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  Other Topics Concern   Not on file  Social History Narrative   Not on file   Social Determinants of Health   Financial Resource Strain: Low Risk  (12/24/2020)   Overall Financial Resource Strain (CARDIA)    Difficulty of Paying Living Expenses: Not hard at all  Food Insecurity: No Food Insecurity (12/24/2020)   Hunger Vital Sign    Worried About Running Out of Food in the Last Year: Never true    Ran Out of Food in the Last Year: Never true  Transportation Needs: No Transportation Needs (12/24/2020)   PRAPARE - Hydrologist (Medical): No    Lack of Transportation (Non-Medical): No  Physical Activity: Insufficiently Active (12/24/2020)   Exercise Vital Sign    Days of Exercise per Week: 4 days    Minutes of Exercise per Session: 30 min  Stress: Stress Concern Present (12/24/2020)   [place]    Feeling of Stress : To some extent  Social Connections: Socially Isolated (12/24/2020)   Social Connection and Isolation Panel [NHANES]    Frequency of  Communication with Friends and Family: Once a week    Frequency of Social Gatherings with Friends and Family: Once a week    Attends Religious Services: Never    Marine scientist or Organizations: No    Attends Archivist Meetings: Never    Marital Status: Married  Human resources officer Violence: Not At Risk (12/24/2020)   Humiliation, Afraid, Rape, and Kick questionnaire    Fear of Current or Ex-Partner: No    Emotionally Abused: No    Physically Abused: No    Sexually Abused: No    Outpatient Medications Prior to Visit  Medication Sig Dispense Refill   b complex vitamins  tablet Take 1 tablet by mouth daily.     cetirizine (ZYRTEC) 10 MG tablet Take 10 mg by mouth daily.      cholecalciferol (VITAMIN D) 1000 units tablet Take 1,000 Units by mouth daily.     furosemide (LASIX) 20 MG tablet Take 1 tablet (20 mg total) by mouth daily. 90 tablet 3   gabapentin (NEURONTIN) 300 MG capsule Take 1 capsule (300 mg total) by mouth at bedtime. 90 capsule 2   Ketotifen Fumarate (ZADITOR OP) Place 1 drop into both eyes 2 (two) times daily as needed (itchy eyes).     levothyroxine (SYNTHROID) 25 MCG tablet Take 1 tablet (25 mcg total) by mouth daily before breakfast. 90 tablet 3   liothyronine (CYTOMEL) 25 MCG tablet Take 0.5 tablets (12.5 mcg total) by mouth daily. 45 tablet 1   nortriptyline (PAMELOR) 10 MG capsule Take 1-2 capsules (10-20 mg total) by mouth at bedtime. 60 capsule 3   buPROPion (WELLBUTRIN XL) 150 MG 24 hr tablet Take 1 tablet (150 mg total) by mouth 2 (two) times daily. 60 tablet 3   DULoxetine (CYMBALTA) 20 MG capsule Take 1 capsule (20 mg total) by mouth daily. 30 capsule 3   gabapentin (NEURONTIN) 100 MG capsule 1 capsule in morning and 1 capsule in afternoon. 180 capsule 1   No facility-administered medications prior to visit.    Allergies  Allergen Reactions   Penicillins Hives    Childhood allergy  Has patient had a PCN reaction causing immediate rash,  facial/tongue/throat swelling, SOB or lightheadedness with hypotension: No Has patient had a PCN reaction causing severe rash involving mucus membranes or skin necrosis: No Has patient had a PCN reaction that required hospitalization No Has patient had a PCN reaction occurring within the last 10 years: No If all of the above answers are "NO", then may proceed with Cephalosporin use.   Sulfa Antibiotics Hives    Review of Systems  Constitutional:  Negative for chills and fever.  HENT:  Negative for congestion.   Respiratory:  Negative for shortness of breath.   Cardiovascular:  Negative for chest pain and palpitations.  Gastrointestinal:  Negative for abdominal pain, blood in stool, constipation, diarrhea, nausea and vomiting.  Genitourinary:  Negative for dysuria, frequency, hematuria and urgency.  Skin:           Neurological:  Negative for headaches.       (+) vertigo.       Objective:    Physical Exam Constitutional:      General: She is not in acute distress.    Appearance: Normal appearance. She is normal weight. She is not ill-appearing.  HENT:     Head: Normocephalic and atraumatic.     Right Ear: External ear normal.     Left Ear: External ear normal.     Nose: Nose normal.     Mouth/Throat:     Mouth: Mucous membranes are moist.     Pharynx: Oropharynx is clear.  Eyes:     General:        Right eye: No discharge.        Left eye: No discharge.     Extraocular Movements: Extraocular movements intact.     Conjunctiva/sclera: Conjunctivae normal.     Pupils: Pupils are equal, round, and reactive to light.  Cardiovascular:     Rate and Rhythm: Normal rate and regular rhythm.     Pulses: Normal pulses.     Heart sounds: Normal heart sounds. No  murmur heard.    No gallop.  Pulmonary:     Effort: Pulmonary effort is normal. No respiratory distress.     Breath sounds: Normal breath sounds. No wheezing or rales.  Abdominal:     General: Bowel sounds are normal.      Palpations: Abdomen is soft.     Tenderness: There is no abdominal tenderness. There is no guarding.  Musculoskeletal:        General: Normal range of motion.     Cervical back: Normal range of motion.     Right lower leg: No edema.     Left lower leg: No edema.  Skin:    General: Skin is warm and dry.  Neurological:     Mental Status: She is alert and oriented to person, place, and time.  Psychiatric:        Mood and Affect: Mood normal.        Behavior: Behavior normal.        Judgment: Judgment normal.     BP 130/74 (BP Location: Right Arm, Patient Position: Sitting, Cuff Size: Normal)   Pulse 63   Temp (!) 97.5 F (36.4 C) (Oral)   Resp 16   Ht 5' 5"$  (1.651 m)   Wt 98 lb 6.4 oz (44.6 kg)   SpO2 95%   BMI 16.37 kg/m  Wt Readings from Last 3 Encounters:  05/21/22 98 lb 6.4 oz (44.6 kg)  03/02/22 96 lb 12.8 oz (43.9 kg)  01/28/22 99 lb 3.2 oz (45 kg)    Diabetic Foot Exam - Simple   No data filed    Lab Results  Component Value Date   WBC 6.8 03/02/2022   HGB 13.5 03/02/2022   HCT 40.8 03/02/2022   PLT 268.0 03/02/2022   GLUCOSE 83 03/02/2022   CHOL 255 (H) 03/02/2022   TRIG 84.0 03/02/2022   HDL 89.10 03/02/2022   LDLCALC 149 (H) 03/02/2022   ALT 18 03/02/2022   AST 25 03/02/2022   NA 139 03/02/2022   K 4.5 03/02/2022   CL 98 03/02/2022   CREATININE 1.08 03/02/2022   BUN 14 03/02/2022   CO2 32 03/02/2022   TSH 1.47 03/02/2022   INR 0.95 10/29/2015    Lab Results  Component Value Date   TSH 1.47 03/02/2022   Lab Results  Component Value Date   WBC 6.8 03/02/2022   HGB 13.5 03/02/2022   HCT 40.8 03/02/2022   MCV 84.8 03/02/2022   PLT 268.0 03/02/2022   Lab Results  Component Value Date   NA 139 03/02/2022   K 4.5 03/02/2022   CO2 32 03/02/2022   GLUCOSE 83 03/02/2022   BUN 14 03/02/2022   CREATININE 1.08 03/02/2022   BILITOT 0.5 03/02/2022   ALKPHOS 46 03/02/2022   AST 25 03/02/2022   ALT 18 03/02/2022   PROT 6.8 03/02/2022    ALBUMIN 4.6 03/02/2022   CALCIUM 10.0 03/02/2022   ANIONGAP 9 09/19/2021   GFR 49.43 (L) 03/02/2022   Lab Results  Component Value Date   CHOL 255 (H) 03/02/2022   Lab Results  Component Value Date   HDL 89.10 03/02/2022   Lab Results  Component Value Date   LDLCALC 149 (H) 03/02/2022   Lab Results  Component Value Date   TRIG 84.0 03/02/2022   Lab Results  Component Value Date   CHOLHDL 3 03/02/2022   No results found for: "HGBA1C"     Assessment & Plan:  Anxiety/Stress: Buspirone 5 mg tid  and Lorazepam 0.5 mg bid prn prescribed today.  Vertigo/Presyncope: CAT scan of the head ordered today. Problem List Items Addressed This Visit     Back pain    She struggles with daily pain but has altered her activity and manages well. She stopped her Bupropion and Cymbalta over time as she did not feel they were helping and does not feel work.      Postural dizziness with presyncope - Primary    She does feel this has worsened and she also endorses some biltateral tinnitus and decreased hearing. She is referred for CT of head      Relevant Orders   CT HEAD WO CONTRAST (5MM)   US Carotid Bilateral   Tinnitus    She notes it is bilateral will proceed with CT of head given confluence of symptoms      Relevant Orders   CT HEAD WO CONTRAST (5MM)   US Carotid Bilateral   Meds ordered this encounter  Medications   LORazepam (ATIVAN) 0.5 MG tablet    Sig: Take 1 tablet (0.5 mg total) by mouth 2 (two) times daily as needed for anxiety.    Dispense:  10 tablet    Refill:  1   busPIRone (BUSPAR) 5 MG tablet    Sig: Take 1 tablet (5 mg total) by mouth 3 (three) times daily.    Dispense:  90 tablet    Refill:  2   I, Penni Homans, MD, personally preformed the services described in this documentation.  All medical record entries made by the scribe were at my direction and in my presence.  I have reviewed the chart and discharge instructions (if applicable) and agree that the  record reflects my personal performance and is accurate and complete. 05/21/2022  I,Mohammed Iqbal,acting as a scribe for Penni Homans, MD.,have documented all relevant documentation on the behalf of Penni Homans, MD,as directed by  Penni Homans, MD while in the presence of Penni Homans, MD.  Penni Homans, MD

## 2022-05-24 DIAGNOSIS — H9319 Tinnitus, unspecified ear: Secondary | ICD-10-CM | POA: Insufficient documentation

## 2022-05-24 NOTE — Assessment & Plan Note (Signed)
She struggles with daily pain but has altered her activity and manages well. She stopped her Bupropion and Cymbalta over time as she did not feel they were helping and does not feel work.

## 2022-05-24 NOTE — Assessment & Plan Note (Addendum)
She notes it is bilateral will proceed with CT of head given confluence of symptoms. She describes a whoosing sound in her ears.

## 2022-05-24 NOTE — Assessment & Plan Note (Signed)
She does feel this has worsened and she also endorses some biltateral tinnitus and decreased hearing. She is referred for CT of head

## 2022-05-25 ENCOUNTER — Telehealth (HOSPITAL_BASED_OUTPATIENT_CLINIC_OR_DEPARTMENT_OTHER): Payer: Self-pay

## 2022-06-04 ENCOUNTER — Ambulatory Visit (HOSPITAL_BASED_OUTPATIENT_CLINIC_OR_DEPARTMENT_OTHER)
Admission: RE | Admit: 2022-06-04 | Discharge: 2022-06-04 | Disposition: A | Discharge status: Home / Self Care | Payer: Medicare Other | Source: Ambulatory Visit | Attending: Family Medicine | Admitting: Family Medicine

## 2022-06-04 DIAGNOSIS — H9319 Tinnitus, unspecified ear: Secondary | ICD-10-CM | POA: Insufficient documentation

## 2022-06-04 DIAGNOSIS — R42 Dizziness and giddiness: Secondary | ICD-10-CM | POA: Insufficient documentation

## 2022-06-04 DIAGNOSIS — I6782 Cerebral ischemia: Secondary | ICD-10-CM | POA: Diagnosis not present

## 2022-06-04 DIAGNOSIS — G319 Degenerative disease of nervous system, unspecified: Secondary | ICD-10-CM | POA: Diagnosis not present

## 2022-06-04 DIAGNOSIS — R55 Syncope and collapse: Secondary | ICD-10-CM | POA: Insufficient documentation

## 2022-06-05 ENCOUNTER — Encounter: Payer: Self-pay | Admitting: Family Medicine

## 2022-06-08 NOTE — Telephone Encounter (Signed)
Called pt was advised of referrals  And pt stated she already seen neurology. Pt stated she will think  about vestibular rehab and will let us know.

## 2022-06-13 ENCOUNTER — Other Ambulatory Visit: Payer: Self-pay | Admitting: Family Medicine

## 2022-06-22 ENCOUNTER — Encounter: Payer: Self-pay | Admitting: Family Medicine

## 2022-06-22 ENCOUNTER — Other Ambulatory Visit: Payer: Self-pay | Admitting: Family Medicine

## 2022-06-22 DIAGNOSIS — H9191 Unspecified hearing loss, right ear: Secondary | ICD-10-CM

## 2022-06-22 MED ORDER — METOLAZONE 2.5 MG PO TABS: ORAL_TABLET | ORAL | 2 refills | Status: DC

## 2022-06-23 ENCOUNTER — Other Ambulatory Visit: Payer: Self-pay

## 2022-06-23 DIAGNOSIS — E785 Hyperlipidemia, unspecified: Secondary | ICD-10-CM

## 2022-06-23 NOTE — Telephone Encounter (Signed)
Called pt lvm referral was sent, medication sent  Need to call to make lab appt for 1/2 weeks and sent pt mychart message.

## 2022-06-29 ENCOUNTER — Ambulatory Visit: Payer: Medicare Other | Admitting: Neurology

## 2022-07-16 ENCOUNTER — Telehealth: Payer: Self-pay | Admitting: Family Medicine

## 2022-07-16 DIAGNOSIS — H903 Sensorineural hearing loss, bilateral: Secondary | ICD-10-CM | POA: Diagnosis not present

## 2022-07-16 DIAGNOSIS — H9313 Tinnitus, bilateral: Secondary | ICD-10-CM | POA: Diagnosis not present

## 2022-07-16 NOTE — Telephone Encounter (Signed)
Copied from [place] [id number]. Topic: Medicare AWV >> Jul 16, 2022  1:41 PM Devoria Glassing wrote: Reason for CRM: Called patient to schedule Medicare Annual Wellness Visit (AWV). Left message for patient to call back and schedule Medicare Annual Wellness Visit (AWV).  Last date of AWV: 12/23/20  Please schedule an appointment at any time with Beatris Ship, [place]  .  If any questions, please contact me.  Thank you ,  Sherol Dade; [place] Direct Dial: [phone]

## 2022-08-26 DIAGNOSIS — Z79899 Other long term (current) drug therapy: Secondary | ICD-10-CM | POA: Diagnosis not present

## 2022-08-26 DIAGNOSIS — G894 Chronic pain syndrome: Secondary | ICD-10-CM | POA: Diagnosis not present

## 2022-08-26 DIAGNOSIS — Z5181 Encounter for therapeutic drug level monitoring: Secondary | ICD-10-CM | POA: Diagnosis not present

## 2022-08-28 ENCOUNTER — Other Ambulatory Visit (INDEPENDENT_AMBULATORY_CARE_PROVIDER_SITE_OTHER): Payer: Medicare Other

## 2022-08-28 DIAGNOSIS — E785 Hyperlipidemia, unspecified: Secondary | ICD-10-CM

## 2022-08-28 NOTE — Addendum Note (Signed)
Addended by: Mervin Kung A on: 08/28/2022 01:43 PM   Modules accepted: Orders

## 2022-08-29 LAB — COMPREHENSIVE METABOLIC PANEL
AG Ratio: 1.9 (calc) (ref 1.0–2.5)
ALT: 19 U/L (ref 6–29)
AST: 33 U/L (ref 10–35)
Albumin: 4.6 g/dL (ref 3.6–5.1)
Alkaline phosphatase (APISO): 49 U/L (ref 37–153)
BUN/Creatinine Ratio: 19 (calc) (ref 6–22)
BUN: 27 mg/dL — ABNORMAL HIGH (ref 7–25)
CO2: 36 mmol/L — ABNORMAL HIGH (ref 20–32)
Calcium: 10.7 mg/dL — ABNORMAL HIGH (ref 8.6–10.4)
Chloride: 87 mmol/L — ABNORMAL LOW (ref 98–110)
Creat: 1.4 mg/dL — ABNORMAL HIGH (ref 0.60–1.00)
Globulin: 2.4 g/dL (calc) (ref 1.9–3.7)
Glucose, Bld: 93 mg/dL (ref 65–99)
Potassium: 2.8 mmol/L — ABNORMAL LOW (ref 3.5–5.3)
Sodium: 136 mmol/L (ref 135–146)
Total Bilirubin: 0.5 mg/dL (ref 0.2–1.2)
Total Protein: 7 g/dL (ref 6.1–8.1)

## 2022-08-30 ENCOUNTER — Other Ambulatory Visit: Payer: Self-pay | Admitting: Family Medicine

## 2022-08-30 MED ORDER — POTASSIUM CHLORIDE CRYS ER 20 MEQ PO TBCR: EXTENDED_RELEASE_TABLET | ORAL | 1 refills | Status: DC

## 2022-08-31 ENCOUNTER — Other Ambulatory Visit: Payer: Self-pay | Admitting: Family Medicine

## 2022-09-02 NOTE — Assessment & Plan Note (Signed)
Encourage heart healthy diet such as MIND or DASH diet, increase exercise, avoid trans fats, simple carbohydrates and processed foods, consider a krill or fish or flaxseed oil cap daily.  °

## 2022-09-03 ENCOUNTER — Ambulatory Visit (INDEPENDENT_AMBULATORY_CARE_PROVIDER_SITE_OTHER): Payer: Medicare Other | Admitting: Family Medicine

## 2022-09-03 VITALS — BP 120/64 | HR 57 | Temp 97.5°F | Resp 16 | Ht 65.0 in | Wt 99.6 lb

## 2022-09-03 DIAGNOSIS — E876 Hypokalemia: Secondary | ICD-10-CM

## 2022-09-03 DIAGNOSIS — E785 Hyperlipidemia, unspecified: Secondary | ICD-10-CM

## 2022-09-03 DIAGNOSIS — F418 Other specified anxiety disorders: Secondary | ICD-10-CM

## 2022-09-03 DIAGNOSIS — M549 Dorsalgia, unspecified: Secondary | ICD-10-CM

## 2022-09-03 DIAGNOSIS — G8929 Other chronic pain: Secondary | ICD-10-CM

## 2022-09-03 LAB — COMPREHENSIVE METABOLIC PANEL
ALT: 19 U/L (ref 0–35)
AST: 35 U/L (ref 0–37)
Albumin: 4.2 g/dL (ref 3.5–5.2)
Alkaline Phosphatase: 42 U/L (ref 39–117)
BUN: 19 mg/dL (ref 6–23)
CO2: 36 mEq/L — ABNORMAL HIGH (ref 19–32)
Calcium: 10.1 mg/dL (ref 8.4–10.5)
Chloride: 89 mEq/L — ABNORMAL LOW (ref 96–112)
Creatinine, Ser: 1.13 mg/dL (ref 0.40–1.20)
GFR: 46.65 mL/min — ABNORMAL LOW (ref >60.00)
Glucose, Bld: 88 mg/dL (ref 70–99)
Potassium: 3.5 mEq/L (ref 3.5–5.1)
Sodium: 132 mEq/L — ABNORMAL LOW (ref 135–145)
Total Bilirubin: 0.5 mg/dL (ref 0.2–1.2)
Total Protein: 6.5 g/dL (ref 6.0–8.3)

## 2022-09-03 LAB — MAGNESIUM: Magnesium: 1.8 mg/dL (ref 1.5–2.5)

## 2022-09-03 LAB — VITAMIN D 25 HYDROXY (VIT D DEFICIENCY, FRACTURES): VITD: 60.1 ng/mL (ref 30.00–100.00)

## 2022-09-03 MED ORDER — BUSPIRONE HCL 7.5 MG PO TABS
7.5000 mg | ORAL_TABLET | Freq: Three times a day (TID) | ORAL | 3 refills | Status: DC
Start: 2022-09-03 — End: 2022-12-08

## 2022-09-03 NOTE — Patient Instructions (Signed)
Hypokalemia Hypokalemia means that the amount of potassium in the blood is lower than normal. Potassium is a mineral (electrolyte) that helps regulate the amount of fluid in the body. It also stimulates muscle tightening (contraction) and helps nerves work properly. Normally, most of the body's potassium is inside cells, and only a very small amount is in the blood. Because the amount in the blood is so small, minor changes to potassium levels in the blood can be life-threatening. What are the causes? This condition may be caused by: Antibiotic medicine. Diarrhea or vomiting. Taking too much of a medicine that helps you have a bowel movement (laxative) can cause diarrhea and lead to hypokalemia. Chronic kidney disease (CKD). Medicines that help the body get rid of excess fluid (diuretics). Eating disorders, such as anorexia or bulimia. Low magnesium levels in the body. Sweating a lot. What are the signs or symptoms? Symptoms of this condition include: Weakness. Constipation. Fatigue. Muscle cramps. Mental confusion. Skipped heartbeats or irregular heartbeat (palpitations). Tingling or numbness. How is this diagnosed? This condition is diagnosed with a blood test. How is this treated? This condition may be treated by: Taking potassium supplements. Adjusting the medicines that you take. Eating more foods that contain a lot of potassium. If your potassium level is very low, you may need to get potassium through an IV and be monitored in the hospital. Follow these instructions at home: Eating and drinking  Eat a healthy diet. A healthy diet includes fresh fruits and vegetables, whole grains, healthy fats, and lean proteins. If told, eat more foods that contain a lot of potassium. These include: Nuts, such as peanuts and pistachios. Seeds, such as sunflower seeds and pumpkin seeds. Peas, lentils, and lima beans. Whole grain and bran cereals and breads. Fresh fruits and vegetables,  such as apricots, avocado, bananas, cantaloupe, kiwi, oranges, tomatoes, asparagus, and potatoes. Juices, such as orange, tomato, and prune. Lean meats, including fish. Milk and milk products, such as yogurt. General instructions Take over-the-counter and prescription medicines only as told by your health care provider. This includes vitamins, natural food products, and supplements. Keep all follow-up visits. This is important. Contact a health care provider if: You have weakness that gets worse. You feel your heart pounding or racing. You vomit. You have diarrhea. You have diabetes and you have trouble keeping your blood sugar in your target range. Get help right away if: You have chest pain. You have shortness of breath. You have vomiting or diarrhea that lasts for more than 2 days. You faint. These symptoms may be an emergency. Get help right away. Call 911. Do not wait to see if the symptoms will go away. Do not drive yourself to the hospital. Summary Hypokalemia means that the amount of potassium in the blood is lower than normal. This condition is diagnosed with a blood test. Hypokalemia may be treated by taking potassium supplements, adjusting the medicines that you take, or eating more foods that are high in potassium. If your potassium level is very low, you may need to get potassium through an IV and be monitored in the hospital. This information is not intended to replace advice given to you by your health care provider. Make sure you discuss any questions you have with your health care provider. Document Revised: 12/12/2020 Document Reviewed: 12/12/2020 Elsevier Patient Education  2023 [employer]  

## 2022-09-03 NOTE — Progress Notes (Signed)
Subjective:   By signing my name below, I, Misty Barker, attest that this documentation has been prepared under the direction and in the presence of Bradd Canary, MD., 09/03/2022.   [name], female    DOB: 1945/04/25, 77 y.o.   MRN: [id number]  Chief Complaint  Patient presents with   Follow-up    Follow up   HPI Patient is in today for an office visit.   Anxiety with Depression and Stress Patient continues to follow with Dr. Sheran Luz at [facility] to manage her chronic lower back and neck pain. These pains have been causing worsening depression. Additionally, she has been experiencing increased stress due to her 30 yo son who has been living with her for the past 4 months and has end-stage renal disease. She currently takes Buspirone 5 mg two times daily which has been ineffective. She is requesting a medication adjustment because of this. Furthermore, she complains of increased fatigue and decreased energy levels.  Hypocalcemia/Hypokalemia Patient reports that her potassium chloride 20 meq tablets are too large to ingest so she has not been taking these. She currently takes magnesium and vitamin D supplements.  Immunizations Patient is not interested in any offered vaccinations today. Past Medical History:  Diagnosis Date   Abdominal pain 02/01/2015   Allergy    "seasonal allergies"   Anemia    Arthritis    osteoarthritis-"DDD" hips. fingers, toes.   Blood transfusion without reported diagnosis    Complication of anesthesia    Depression    Depression with anxiety    Frequent headaches    no problems now.   Hearing loss in right ear 03/10/2015   same over last 6 months   Hip dislocation, right (HCC)    History of chicken pox    Hyperlipidemia    pt. state no hx   Hypokalemia 02/04/2017   Hypothyroidism    supplement used   Osteoporosis    Peripheral neuropathy 02/01/2015   right hand"carpal tunnel"   PONV (postoperative  nausea and vomiting)    Preventative health care 02/04/2017   Right hip pain 05/24/2015   Scoliosis 02/01/2015   Spinal stenosis of lumbar region 10/23/2016   Thyroid disease    URI, acute 03/10/2015   UTI (urinary tract infection) 12/14/2016   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    Past Surgical History:  Procedure Laterality Date   ACETABULAR REVISION Right 10/30/2015   Procedure: RIGHT HIP ACETABULAR REVISION /CONSTRAINED LINER (POSTERIOR APPROACH) ;  Surgeon: Ollen Gross, MD;  Location: [facility];  Service: Orthopedics;  Laterality: Right;   BREAST EXCISIONAL BIOPSY Right    BREAST SURGERY     lump removed, and breast reduction   EYE SURGERY     lasik   2002   HIP CLOSED REDUCTION Right 06/30/2015   Procedure: CLOSED MANIPULATION HIP;  Surgeon: Venita Lick, MD;  Location: [facility];  Service: Orthopedics;  Laterality: Right;   JOINT REPLACEMENT     3 hips on right and 1 on left   METACARPOPHALANGEAL JOINT CAPSULECTOMY / CAPSULOTOMY  1995   REDUCTION MAMMAPLASTY     SPINAL CORD STIMULATOR INSERTION N/A 12/02/2016   Procedure: LUMBAR SPINAL CORD STIMULATOR INSERTION;  Surgeon: Venita Lick, MD;  Location: [facility];  Service: Orthopedics;  Laterality: N/A;  2.5 hrs   SPINAL CORD STIMULATOR REMOVAL N/A 12/31/2016   Procedure: LUMBAR SPINAL CORD STIMULATOR REMOVAL;  Surgeon: Venita Lick, MD;  Location: [facility]  OR;  Service: Orthopedics;  Laterality: N/A;  60 mins   TONSILLECTOMY     TOTAL HIP ARTHROPLASTY  2011   left hip   TOTAL HIP ARTHROPLASTY Right 06/12/2015   Procedure: RIGHT TOTAL HIP ARTHROPLASTY ANTERIOR APPROACH;  Surgeon: Ollen Gross, MD;  Location: [facility];  Service: Orthopedics;  Laterality: Right;   TOTAL HIP REVISION Right 07/19/2015   Procedure: RIGHT HIP BEARING SURFACE REVISION;  Surgeon: Ollen Gross, MD;  Location: [facility];  Service: Orthopedics;  Laterality: Right;   TUBAL LIGATION  1980   left fallopian tube removal    Family History  Problem Relation Age  of Onset   Arthritis Mother    Dementia Mother    Mental illness Father        suicide   Arthritis Maternal Grandmother    Kidney disease Son        b/l multicystic kidney disease s/p 4 transplants   Colon cancer Neg Hx     Social History   Socioeconomic History   Marital status: Married    Spouse name: Not on file   Number of children: Not on file   Years of education: 16   Highest education level: Bachelor's degree (e.g., BA, AB, BS)  Occupational History   Occupation: retired  Tobacco Use   Smoking status: Never   Smokeless tobacco: Never  Vaping Use   Vaping Use: Never used  Substance and Sexual Activity   Alcohol use: Yes    Alcohol/week: 1.0 standard drink of alcohol    Types: 1 Glasses of wine per week   Drug use: No   Sexual activity: Not on file    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, retired Audiological scientist, 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  Other Topics Concern   Not on file  Social History Narrative   Not on file   Social Determinants of Health   Financial Resource Strain: Low Risk  (12/24/2020)   Overall Financial Resource Strain (CARDIA)    Difficulty of Paying Living Expenses: Not hard at all  Food Insecurity: No Food Insecurity (09/02/2022)   Hunger Vital Sign    Worried About Running Out of Food in the Last Year: Never true    Ran Out of Food in the Last Year: Never true  Transportation Needs: No Transportation Needs (09/02/2022)   PRAPARE - Administrator, Civil Service (Medical): No    Lack of Transportation (Non-Medical): No  Physical Activity: Insufficiently Active (09/02/2022)   Exercise Vital Sign    Days of Exercise per Week: 3 days    Minutes of Exercise per Session: 10 min  Stress: No Stress Concern Present (09/02/2022)   [employer] of Occupational Health - Occupational Stress Questionnaire    Feeling of Stress : Only a little  Social Connections: Unknown (09/02/2022)   Social Connection and Isolation Panel [NHANES]    Frequency of  Communication with Friends and Family: Patient declined    Frequency of Social Gatherings with Friends and Family: Patient declined    Attends Religious Services: Patient declined    Database administrator or Organizations: Patient declined    Attends Banker Meetings: Not on file    Marital Status: Married  Intimate Partner Violence: Not At Risk (12/24/2020)   Humiliation, Afraid, Rape, and Kick questionnaire    Fear of Current or Ex-Partner: No    Emotionally Abused: No    Physically Abused: No    Sexually Abused: No    Outpatient Medications Prior to  Visit  Medication Sig Dispense Refill   b complex vitamins tablet Take 1 tablet by mouth daily.     cetirizine (ZYRTEC) 10 MG tablet Take 10 mg by mouth daily.      cholecalciferol (VITAMIN D) 1000 units tablet Take 1,000 Units by mouth daily.     Ketotifen Fumarate (ZADITOR OP) Place 1 drop into both eyes 2 (two) times daily as needed (itchy eyes).     levothyroxine (SYNTHROID) 25 MCG tablet Take 1 tablet (25 mcg total) by mouth daily before breakfast. 90 tablet 3   liothyronine (CYTOMEL) 25 MCG tablet TAKE 1/2 TABLET(12.5 MCG) BY MOUTH DAILY 45 tablet 1   metolazone (ZAROXOLYN) 2.5 MG tablet Take 20-30 minutes prior to am Furosemide dose 30 tablet 2   potassium chloride SA (KLOR-CON M) 20 MEQ tablet 1 tab po tid x 3 days then bid 3 days then 1 tab daily prn 40 tablet 1   busPIRone (BUSPAR) 5 MG tablet Take 1 tablet (5 mg total) by mouth 3 (three) times daily. 90 tablet 2   buPROPion (WELLBUTRIN XL) 150 MG 24 hr tablet Take 1 tablet (150 mg total) by mouth 2 (two) times daily. 60 tablet 3   DULoxetine (CYMBALTA) 20 MG capsule Take 1 capsule (20 mg total) by mouth daily. 30 capsule 3   furosemide (LASIX) 20 MG tablet Take 1 tablet (20 mg total) by mouth daily. 90 tablet 3   gabapentin (NEURONTIN) 100 MG capsule 1 capsule in morning and 1 capsule in afternoon. 180 capsule 1   gabapentin (NEURONTIN) 300 MG capsule Take 1 capsule  (300 mg total) by mouth at bedtime. 90 capsule 2   LORazepam (ATIVAN) 0.5 MG tablet Take 1 tablet (0.5 mg total) by mouth 2 (two) times daily as needed for anxiety. 10 tablet 1   nortriptyline (PAMELOR) 10 MG capsule Take 1-2 capsules (10-20 mg total) by mouth at bedtime. 60 capsule 3   No facility-administered medications prior to visit.    Allergies  Allergen Reactions   Penicillins Hives    Childhood allergy  Has patient had a PCN reaction causing immediate rash, facial/tongue/throat swelling, SOB or lightheadedness with hypotension: No Has patient had a PCN reaction causing severe rash involving mucus membranes or skin necrosis: No Has patient had a PCN reaction that required hospitalization No Has patient had a PCN reaction occurring within the last 10 years: No If all of the above answers are "NO", then may proceed with Cephalosporin use.   Sulfa Antibiotics Hives    Review of Systems  Constitutional:  Negative for chills and fever.  Respiratory:  Negative for shortness of breath.   Cardiovascular:  Negative for chest pain and palpitations.  Gastrointestinal:  Negative for abdominal pain, blood in stool, constipation, diarrhea, nausea and vomiting.  Genitourinary:  Negative for dysuria, frequency, hematuria and urgency.  Musculoskeletal:  Positive for back pain, myalgias and neck pain.  Skin:           Neurological:  Negative for headaches.       Objective:    Physical Exam Constitutional:      General: She is not in acute distress.    Appearance: Normal appearance. She is not ill-appearing.  HENT:     Head: Normocephalic and atraumatic.     Right Ear: External ear normal.     Left Ear: External ear normal.     Nose: Nose normal.     Mouth/Throat:     Mouth: Mucous membranes are moist.  Pharynx: Oropharynx is clear.  Eyes:     General:        Right eye: No discharge.        Left eye: No discharge.     Extraocular Movements: Extraocular movements intact.      Conjunctiva/sclera: Conjunctivae normal.     Pupils: Pupils are equal, round, and reactive to light.  Cardiovascular:     Rate and Rhythm: Normal rate and regular rhythm.     Pulses: Normal pulses.     Heart sounds: Normal heart sounds. No murmur heard.    No gallop.  Pulmonary:     Effort: Pulmonary effort is normal. No respiratory distress.     Breath sounds: Normal breath sounds. No wheezing or rales.  Abdominal:     General: Bowel sounds are normal.     Palpations: Abdomen is soft.     Tenderness: There is no abdominal tenderness. There is no guarding.  Musculoskeletal:        General: Normal range of motion.     Cervical back: Normal range of motion.     Right lower leg: No edema.     Left lower leg: No edema.  Skin:    General: Skin is warm and dry.  Neurological:     Mental Status: She is alert and oriented to person, place, and time.  Psychiatric:        Mood and Affect: Mood normal.        Behavior: Behavior normal.        Judgment: Judgment normal.     BP 120/64 (BP Location: Right Arm, Patient Position: Sitting, Cuff Size: Normal)   Pulse (!) 57   Temp (!) 97.5 F (36.4 C) (Oral)   Resp 16   Ht 5\' 5"  (1.651 m)   Wt 99 lb 9.6 oz (45.2 kg)   SpO2 95%   BMI 16.57 kg/m  Wt Readings from Last 3 Encounters:  09/03/22 99 lb 9.6 oz (45.2 kg)  05/21/22 98 lb 6.4 oz (44.6 kg)  03/02/22 96 lb 12.8 oz (43.9 kg)    Diabetic Foot Exam - Simple   No data filed    Lab Results  Component Value Date   WBC 6.8 03/02/2022   HGB 13.5 03/02/2022   HCT 40.8 03/02/2022   PLT 268.0 03/02/2022   GLUCOSE 88 09/03/2022   CHOL 255 (H) 03/02/2022   TRIG 84.0 03/02/2022   HDL 89.10 03/02/2022   LDLCALC 149 (H) 03/02/2022   ALT 19 09/03/2022   AST 35 09/03/2022   NA 132 (L) 09/03/2022   K 3.5 09/03/2022   CL 89 (L) 09/03/2022   CREATININE 1.13 09/03/2022   BUN 19 09/03/2022   CO2 36 (H) 09/03/2022   TSH 1.47 03/02/2022   INR 0.95 10/29/2015    Lab Results   Component Value Date   TSH 1.47 03/02/2022   Lab Results  Component Value Date   WBC 6.8 03/02/2022   HGB 13.5 03/02/2022   HCT 40.8 03/02/2022   MCV 84.8 03/02/2022   PLT 268.0 03/02/2022   Lab Results  Component Value Date   NA 132 (L) 09/03/2022   K 3.5 09/03/2022   CO2 36 (H) 09/03/2022   GLUCOSE 88 09/03/2022   BUN 19 09/03/2022   CREATININE 1.13 09/03/2022   BILITOT 0.5 09/03/2022   ALKPHOS 42 09/03/2022   AST 35 09/03/2022   ALT 19 09/03/2022   PROT 6.5 09/03/2022   ALBUMIN 4.2 09/03/2022   CALCIUM 10.1  09/03/2022   ANIONGAP 9 09/19/2021   GFR 46.65 (L) 09/03/2022   Lab Results  Component Value Date   CHOL 255 (H) 03/02/2022   Lab Results  Component Value Date   HDL 89.10 03/02/2022   Lab Results  Component Value Date   LDLCALC 149 (H) 03/02/2022   Lab Results  Component Value Date   TRIG 84.0 03/02/2022   Lab Results  Component Value Date   CHOLHDL 3 03/02/2022   No results found for: "HGBA1C"     Assessment & Plan:  Anxiety with Depression and Stress: Buspirone increased to 7.5 mg up to three times daily.  Healthy Lifestyle: Encouraged 6-8 hours of sleep, heart healthy diet, 60-80 oz of non-alcohol/non-caffeinated fluids, and 4000-8000 steps daily.  Hypercalcemia/Hypokalemia: Encouraged patient to stop taking calcium and Metolazone 2.5 mg until further notice. Continue taking magnesium and vitamin D supplements and eat potassium rich foods.  Immunizations: Encouraged patient to consider annual COVID-19 and Influenza vaccinations but she declines these at this time.  Labs: Blood work today will check complete metabolic panel, parathyroid hormone, and vitamin D. Problem List Items Addressed This Visit     Hypokalemia   Relevant Orders   Magnesium (Completed)   Comp Met (CMET) (Completed)   Back pain   Relevant Orders   Magnesium (Completed)   Hyperlipidemia - Primary    Encourage heart healthy diet such as MIND or DASH diet, increase  exercise, avoid trans fats, simple carbohydrates and processed foods, consider a krill or fish or flaxseed oil cap daily.        Depression with anxiety    Patient struggling with significant stressors will try adding Buspar 7.5 mg and see if that helps. She will notify us if any new concerns develop.       Relevant Medications   busPIRone (BUSPAR) 7.5 MG tablet   Hypercalcemia    Check PTH and vitamin D minimize supplemental calcium      Relevant Orders   Magnesium (Completed)   VITAMIN D 25 Hydroxy (Vit-D Deficiency, Fractures) (Completed)   PTH, intact (no Ca) (Completed)   Meds ordered this encounter  Medications   busPIRone (BUSPAR) 7.5 MG tablet    Sig: Take 1 tablet (7.5 mg total) by mouth 3 (three) times daily.    Dispense:  90 tablet    Refill:  3    Only fill if patient requests   I, Danise Edge, MD, personally preformed the services described in this documentation.  All medical record entries made by the scribe were at my direction and in my presence.  I have reviewed the chart and discharge instructions (if applicable) and agree that the record reflects my personal performance and is accurate and complete. 09/03/2022  I,Mohammed Iqbal,acting as a scribe for Danise Edge, MD.,have documented all relevant documentation on the behalf of Danise Edge, MD,as directed by  Danise Edge, MD while in the presence of Danise Edge, MD.  Danise Edge, MD

## 2022-09-04 ENCOUNTER — Other Ambulatory Visit: Payer: Self-pay

## 2022-09-04 DIAGNOSIS — E876 Hypokalemia: Secondary | ICD-10-CM

## 2022-09-04 LAB — PARATHYROID HORMONE, INTACT (NO CA): PTH: 41 pg/mL (ref 16–77)

## 2022-09-07 ENCOUNTER — Encounter: Payer: Self-pay | Admitting: Family Medicine

## 2022-09-07 NOTE — Assessment & Plan Note (Signed)
Patient struggling with significant stressors will try adding Buspar 7.5 mg and see if that helps. She will notify us if any new concerns develop.

## 2022-09-07 NOTE — Assessment & Plan Note (Signed)
Check PTH and vitamin D minimize supplemental calcium

## 2022-09-09 ENCOUNTER — Other Ambulatory Visit: Payer: Self-pay | Admitting: Family Medicine

## 2022-09-09 ENCOUNTER — Telehealth: Payer: Self-pay | Admitting: Family Medicine

## 2022-09-09 MED ORDER — GABAPENTIN 300 MG PO CAPS: 300.0000 mg | ORAL_CAPSULE | Freq: Every day | ORAL | 2 refills | Status: DC

## 2022-09-09 NOTE — Telephone Encounter (Signed)
Pt is requesting her Gabapentin 300 mg to be refilled. PT said that Dr. Arlyce Dice used to prescribe it but she no longer sees him so she wants Dr. Abner Greenspan to send it in. Please send to [facility] on [address].

## 2022-09-21 DIAGNOSIS — G5603 Carpal tunnel syndrome, bilateral upper limbs: Secondary | ICD-10-CM | POA: Diagnosis not present

## 2022-09-21 DIAGNOSIS — M13831 Other specified arthritis, right wrist: Secondary | ICD-10-CM | POA: Diagnosis not present

## 2022-09-21 DIAGNOSIS — I7300 Raynaud's syndrome without gangrene: Secondary | ICD-10-CM | POA: Diagnosis not present

## 2022-09-21 DIAGNOSIS — M13841 Other specified arthritis, right hand: Secondary | ICD-10-CM | POA: Diagnosis not present

## 2022-09-23 DIAGNOSIS — H35361 Drusen (degenerative) of macula, right eye: Secondary | ICD-10-CM | POA: Diagnosis not present

## 2022-09-23 DIAGNOSIS — H25813 Combined forms of age-related cataract, bilateral: Secondary | ICD-10-CM | POA: Diagnosis not present

## 2022-09-23 DIAGNOSIS — H10413 Chronic giant papillary conjunctivitis, bilateral: Secondary | ICD-10-CM | POA: Diagnosis not present

## 2022-09-23 DIAGNOSIS — H0102A Squamous blepharitis right eye, upper and lower eyelids: Secondary | ICD-10-CM | POA: Diagnosis not present

## 2022-09-23 DIAGNOSIS — H40013 Open angle with borderline findings, low risk, bilateral: Secondary | ICD-10-CM | POA: Diagnosis not present

## 2022-09-23 DIAGNOSIS — H0102B Squamous blepharitis left eye, upper and lower eyelids: Secondary | ICD-10-CM | POA: Diagnosis not present

## 2022-09-23 DIAGNOSIS — D3131 Benign neoplasm of right choroid: Secondary | ICD-10-CM | POA: Diagnosis not present

## 2022-10-02 DIAGNOSIS — G5603 Carpal tunnel syndrome, bilateral upper limbs: Secondary | ICD-10-CM | POA: Diagnosis not present

## 2022-10-22 DIAGNOSIS — G5603 Carpal tunnel syndrome, bilateral upper limbs: Secondary | ICD-10-CM | POA: Diagnosis not present

## 2022-10-22 DIAGNOSIS — M13831 Other specified arthritis, right wrist: Secondary | ICD-10-CM | POA: Diagnosis not present

## 2022-10-22 DIAGNOSIS — M13841 Other specified arthritis, right hand: Secondary | ICD-10-CM | POA: Diagnosis not present

## 2022-11-11 ENCOUNTER — Encounter (INDEPENDENT_AMBULATORY_CARE_PROVIDER_SITE_OTHER): Payer: Self-pay

## 2022-11-27 ENCOUNTER — Telehealth: Payer: Self-pay | Admitting: Family Medicine

## 2022-11-27 ENCOUNTER — Other Ambulatory Visit: Payer: Self-pay | Admitting: Family Medicine

## 2022-11-27 NOTE — Telephone Encounter (Signed)
Prescription Request  11/27/2022  Is this a "Controlled Substance" medicine? No  LOV: 09/03/2022  What is the name of the medication or equipment? furosemide (LASIX) 20 MG tablet & Ciprofloxacin   Have you contacted your pharmacy to request a refill? Yes     [id number] [place], [place] - [address] AT [facility] [address] [place] [address] Phone: [phone] Fax: [phone]     Patient notified that their request is being sent to the clinical staff for review and that they should receive a response within 2 business days.   Please advise at Mobile [phone] (mobile)

## 2022-11-28 ENCOUNTER — Encounter: Payer: Self-pay | Admitting: Family Medicine

## 2022-11-30 ENCOUNTER — Other Ambulatory Visit: Payer: Self-pay

## 2022-11-30 ENCOUNTER — Other Ambulatory Visit: Payer: Self-pay | Admitting: Family Medicine

## 2022-11-30 MED ORDER — CIPROFLOXACIN HCL 250 MG PO TABS: 250.0000 mg | ORAL_TABLET | Freq: Two times a day (BID) | ORAL | 2 refills | Status: DC

## 2022-11-30 MED ORDER — FUROSEMIDE 20 MG PO TABS: 20.0000 mg | ORAL_TABLET | Freq: Every day | ORAL | 1 refills | Status: DC

## 2022-11-30 NOTE — Telephone Encounter (Signed)
Refill sent.

## 2022-12-07 NOTE — Assessment & Plan Note (Signed)
Hydrate and monitor 

## 2022-12-07 NOTE — Assessment & Plan Note (Signed)
She struggles with daily pain but has altered her activity and manages well.

## 2022-12-07 NOTE — Assessment & Plan Note (Signed)
Continue to manage medically

## 2022-12-07 NOTE — Assessment & Plan Note (Signed)
On Levothyroxine, continue to monitor 

## 2022-12-07 NOTE — Assessment & Plan Note (Signed)
Encourage heart healthy diet such as MIND or DASH diet, increase exercise, avoid trans fats, simple carbohydrates and processed foods, consider a krill or fish or flaxseed oil cap daily.  °

## 2022-12-08 ENCOUNTER — Ambulatory Visit (INDEPENDENT_AMBULATORY_CARE_PROVIDER_SITE_OTHER): Payer: Medicare Other | Admitting: Family Medicine

## 2022-12-08 VITALS — BP 118/64 | HR 60 | Temp 97.7°F | Resp 16 | Ht 65.0 in | Wt 99.4 lb

## 2022-12-08 DIAGNOSIS — E785 Hyperlipidemia, unspecified: Secondary | ICD-10-CM | POA: Diagnosis not present

## 2022-12-08 DIAGNOSIS — E079 Disorder of thyroid, unspecified: Secondary | ICD-10-CM

## 2022-12-08 DIAGNOSIS — M791 Myalgia, unspecified site: Secondary | ICD-10-CM

## 2022-12-08 DIAGNOSIS — Z23 Encounter for immunization: Secondary | ICD-10-CM

## 2022-12-08 DIAGNOSIS — R3911 Hesitancy of micturition: Secondary | ICD-10-CM

## 2022-12-08 DIAGNOSIS — M549 Dorsalgia, unspecified: Secondary | ICD-10-CM | POA: Diagnosis not present

## 2022-12-08 DIAGNOSIS — I700 Atherosclerosis of aorta: Secondary | ICD-10-CM | POA: Diagnosis not present

## 2022-12-08 DIAGNOSIS — R293 Abnormal posture: Secondary | ICD-10-CM | POA: Diagnosis not present

## 2022-12-08 DIAGNOSIS — R110 Nausea: Secondary | ICD-10-CM | POA: Diagnosis not present

## 2022-12-08 LAB — URINALYSIS, ROUTINE W REFLEX MICROSCOPIC
Bilirubin Urine: NEGATIVE
Hgb urine dipstick: NEGATIVE
Ketones, ur: NEGATIVE
Nitrite: NEGATIVE
Specific Gravity, Urine: 1.01 (ref 1.000–1.030)
Total Protein, Urine: NEGATIVE
Urine Glucose: NEGATIVE
Urobilinogen, UA: 0.2 (ref 0.0–1.0)
pH: 7 (ref 5.0–8.0)

## 2022-12-08 LAB — LIPID PANEL
Cholesterol: 272 mg/dL — ABNORMAL HIGH (ref 0–200)
HDL: 68.6 mg/dL (ref >39.00)
LDL Cholesterol: 183 mg/dL — ABNORMAL HIGH (ref 0–99)
NonHDL: 203.55
Total CHOL/HDL Ratio: 4
Triglycerides: 104 mg/dL (ref 0.0–149.0)
VLDL: 20.8 mg/dL (ref 0.0–40.0)

## 2022-12-08 LAB — CBC WITH DIFFERENTIAL/PLATELET
Basophils Absolute: 0 10*3/uL (ref 0.0–0.1)
Basophils Relative: 0.5 % (ref 0.0–3.0)
Eosinophils Absolute: 0.2 10*3/uL (ref 0.0–0.7)
Eosinophils Relative: 3.3 % (ref 0.0–5.0)
HCT: 38 % (ref 36.0–46.0)
Hemoglobin: 12.5 g/dL (ref 12.0–15.0)
Lymphocytes Relative: 24.8 % (ref 12.0–46.0)
Lymphs Abs: 1.7 10*3/uL (ref 0.7–4.0)
MCHC: 32.9 g/dL (ref 30.0–36.0)
MCV: 84.3 fl (ref 78.0–100.0)
Monocytes Absolute: 0.6 10*3/uL (ref 0.1–1.0)
Monocytes Relative: 8.2 % (ref 3.0–12.0)
Neutro Abs: 4.3 10*3/uL (ref 1.4–7.7)
Neutrophils Relative %: 63.2 % (ref 43.0–77.0)
Platelets: 300 10*3/uL (ref 150.0–400.0)
RBC: 4.51 Mil/uL (ref 3.87–5.11)
RDW: 13.6 % (ref 11.5–15.5)
WBC: 6.8 10*3/uL (ref 4.0–10.5)

## 2022-12-08 LAB — COMPREHENSIVE METABOLIC PANEL
ALT: 16 U/L (ref 0–35)
AST: 29 U/L (ref 0–37)
Albumin: 4.3 g/dL (ref 3.5–5.2)
Alkaline Phosphatase: 42 U/L (ref 39–117)
BUN: 17 mg/dL (ref 6–23)
CO2: 36 mEq/L — ABNORMAL HIGH (ref 19–32)
Calcium: 10.1 mg/dL (ref 8.4–10.5)
Chloride: 89 mEq/L — ABNORMAL LOW (ref 96–112)
Creatinine, Ser: 1.05 mg/dL (ref 0.40–1.20)
GFR: 50.86 mL/min — ABNORMAL LOW (ref >60.00)
Glucose, Bld: 88 mg/dL (ref 70–99)
Potassium: 3.8 mEq/L (ref 3.5–5.1)
Sodium: 135 mEq/L (ref 135–145)
Total Bilirubin: 0.5 mg/dL (ref 0.2–1.2)
Total Protein: 6.6 g/dL (ref 6.0–8.3)

## 2022-12-08 LAB — T4, FREE: Free T4: 0.53 ng/dL — ABNORMAL LOW (ref 0.60–1.60)

## 2022-12-08 LAB — TSH: TSH: 0.2 u[IU]/mL — ABNORMAL LOW (ref 0.35–5.50)

## 2022-12-08 MED ORDER — LORAZEPAM 0.5 MG PO TABS: 0.5000 mg | ORAL_TABLET | Freq: Two times a day (BID) | ORAL | 1 refills | Status: DC | PRN

## 2022-12-08 NOTE — Patient Instructions (Signed)
Nausea, Adult Nausea is the feeling of having an upset stomach or that you are about to vomit. Nausea on its own is not usually a serious concern, but it may be an early sign of a more serious medical problem. As nausea gets worse, it can lead to vomiting. If vomiting develops, or if you are not able to drink enough fluids, you are at risk of becoming dehydrated. Dehydration can make you tired and thirsty, cause you to have a dry mouth, and decrease how often you urinate. Older adults and people with other diseases or a weak disease-fighting system (immune system) are at higher risk for dehydration. The main goals of treating your nausea are: To relieve your nausea. To limit repeated nausea episodes. To prevent vomiting and dehydration. Follow these instructions at home: Watch your symptoms for any changes. Tell your health care provider about them. Eating and drinking     Take an oral rehydration solution (ORS). This is a drink that is sold at pharmacies and retail stores. Drink clear fluids slowly and in small amounts as you are able. Clear fluids include water, ice chips, low-calorie sports drinks, and fruit juice that has water added (diluted fruit juice). Eat bland, easy-to-digest foods in small amounts as you are able. These foods include bananas, applesauce, rice, lean meats, toast, and crackers. Avoid drinking fluids that contain a lot of sugar or caffeine, such as energy drinks, sports drinks, and soda. Avoid alcohol. Avoid spicy or fatty foods. General instructions Take over-the-counter and prescription medicines only as told by your health care provider. Rest at home while you recover. Drink enough fluid to keep your urine pale yellow. Breathe slowly and deeply when you feel nauseous. Avoid smelling things that have strong odors. Wash your hands often using soap and water for at least 20 seconds. If soap and water are not available, use hand sanitizer. Make sure that everyone in  your household washes their hands well and often. Keep all follow-up visits. This is important. Contact a health care provider if: Your nausea gets worse. Your nausea does not go away after two days. You vomit multiple times. You cannot drink fluids without vomiting. You have any of the following: New symptoms. A fever. A headache. Muscle cramps. A rash. Pain while urinating. You feel light-headed or dizzy. Get help right away if: You have pain in your chest, neck, arm, or jaw. You feel extremely weak or you faint. You have vomit that is bright red or looks like coffee grounds. You have bloody or black stools (feces) or stools that look like tar. You have a severe headache, a stiff neck, or both. You have severe pain, cramping, or bloating in your abdomen. You have difficulty breathing or are breathing very quickly. Your heart is beating very quickly. Your skin feels cold and clammy. You feel confused. You have signs of dehydration, such as: Dark urine, very little urine, or no urine. Cracked lips. Dry mouth. Sunken eyes. Sleepiness. Weakness. These symptoms may be an emergency. Get help right away. Call 911. Do not wait to see if the symptoms will go away. Do not drive yourself to the hospital. Summary Nausea is the feeling that you have an upset stomach or that you are about to vomit. Nausea on its own is not usually a serious concern, but it may be an early sign of a more serious medical problem. If vomiting develops, or if you are not able to drink enough fluids, you are at risk of becoming   dehydrated. Follow recommendations for eating and drinking and take over-the-counter and prescription medicines only as told by your health care provider. Contact a health care provider right away if your symptoms worsen or you have new symptoms. Keep all follow-up visits. This is important. This information is not intended to replace advice given to you by your health care provider.  Make sure you discuss any questions you have with your health care provider. Document Revised: 10/04/2020 Document Reviewed: 10/04/2020 Elsevier Patient Education  2024 [employer].

## 2022-12-09 ENCOUNTER — Encounter: Payer: Self-pay | Admitting: Family Medicine

## 2022-12-09 LAB — URINE CULTURE
MICRO NUMBER:: 15387392
Result:: NO GROWTH
SPECIMEN QUALITY:: ADEQUATE

## 2022-12-09 NOTE — Progress Notes (Signed)
Subjective:    [name], female    DOB: 09-Mar-1946, 77 y.o.   MRN: [id number]  Chief Complaint  Patient presents with  . Follow-up    Follow up    HPI Discussed the use of AI scribe software for clinical note transcription with the patient, who gave verbal consent to proceed.  History of Present Illness   The patient, with a history of chronic back and hip pain, presents with worsening of these symptoms. They also report feeling lightheaded all the time, a new symptom. The patient describes a constant feeling of nausea, which is also a new symptom. They report not feeling hungry but continue to eat to maintain their health. The patient also mentions urinary hesitancy, stating that they have to urinate frequently but only in small amounts. They had a recent urinary tract infection, which was treated with clindamycin. The patient also reports a growing lump on their left flank, which has been present for years but has recently increased in size and become tender. The patient is under significant stress due to a difficult home situation involving their son.        Past Medical History:  Diagnosis Date  . Abdominal pain 02/01/2015  . Allergy    "seasonal allergies"  . Anemia   . Arthritis    osteoarthritis-"DDD" hips. fingers, toes.  . Blood transfusion without reported diagnosis   . Complication of anesthesia   . Depression   . Depression with anxiety   . Frequent headaches    no problems now.  Marland Kitchen Hearing loss in right ear 03/10/2015   same over last 6 months  . Hip dislocation, right (HCC)   . History of chicken pox   . Hyperlipidemia    pt. state no hx  . Hypokalemia 02/04/2017  . Hypothyroidism    supplement used  . Osteoporosis   . Peripheral neuropathy 02/01/2015   right hand"carpal tunnel"  . PONV (postoperative nausea and vomiting)   . Preventative health care 02/04/2017  . Right hip pain 05/24/2015  . Scoliosis 02/01/2015  . Spinal  stenosis of lumbar region 10/23/2016  . Thyroid disease   . URI, acute 03/10/2015  . UTI (urinary tract infection) 12/14/2016  . 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 ACETABULAR REVISION Right 10/30/2015   Procedure: RIGHT HIP ACETABULAR REVISION /CONSTRAINED LINER (POSTERIOR APPROACH) ;  Surgeon: Ollen Gross, MD;  Location: [facility];  Service: Orthopedics;  Laterality: Right;  . BREAST EXCISIONAL BIOPSY Right   . BREAST SURGERY     lump removed, and breast reduction  . EYE SURGERY     lasik   2002  . HIP CLOSED REDUCTION Right 06/30/2015   Procedure: CLOSED MANIPULATION HIP;  Surgeon: Venita Lick, MD;  Location: [facility];  Service: Orthopedics;  Laterality: Right;  . JOINT REPLACEMENT     3 hips on right and 1 on left  . METACARPOPHALANGEAL JOINT CAPSULECTOMY / CAPSULOTOMY  1995  . REDUCTION MAMMAPLASTY    . SPINAL CORD STIMULATOR INSERTION N/A 12/02/2016   Procedure: LUMBAR SPINAL CORD STIMULATOR INSERTION;  Surgeon: Venita Lick, MD;  Location: [facility];  Service: Orthopedics;  Laterality: N/A;  2.5 hrs  . SPINAL CORD STIMULATOR REMOVAL N/A 12/31/2016   Procedure: LUMBAR SPINAL CORD STIMULATOR REMOVAL;  Surgeon: Venita Lick, MD;  Location: [facility];  Service: Orthopedics;  Laterality: N/A;  60 mins  .  TONSILLECTOMY    . TOTAL HIP ARTHROPLASTY  2011   left hip  . TOTAL HIP ARTHROPLASTY Right 06/12/2015   Procedure: RIGHT TOTAL HIP ARTHROPLASTY ANTERIOR APPROACH;  Surgeon: Ollen Gross, MD;  Location: [facility];  Service: Orthopedics;  Laterality: Right;  . TOTAL HIP REVISION Right 07/19/2015   Procedure: RIGHT HIP BEARING SURFACE REVISION;  Surgeon: Ollen Gross, MD;  Location: [facility];  Service: Orthopedics;  Laterality: Right;  . TUBAL LIGATION  1980   left fallopian tube removal    Family History  Problem Relation Age of Onset  . Arthritis Mother   . Dementia Mother   . Mental illness Father        suicide  .  Arthritis Maternal Grandmother   . Kidney disease Son        b/l multicystic kidney disease s/p 4 transplants  . Colon cancer Neg Hx     Social History   Socioeconomic History  . Marital status: Married    Spouse name: Not on file  . Number of children: Not on file  . Years of education: 56  . Highest education level: Bachelor's degree (e.g., BA, AB, BS)  Occupational History  . Occupation: retired  Tobacco Use  . Smoking status: Never  . Smokeless tobacco: Never  Vaping Use  . Vaping status: Never Used  Substance and Sexual Activity  . Alcohol use: Yes    Alcohol/week: 1.0 standard drink of alcohol    Types: 1 Glasses of wine per week  . Drug use: No  . 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 Not on file  Social History Narrative  . Not on file   Social Determinants of Health   Financial Resource Strain: Low Risk  (12/24/2020)   Overall Financial Resource Strain (CARDIA)   . Difficulty of Paying Living Expenses: Not hard at all  Food Insecurity: No Food Insecurity (09/02/2022)   Hunger Vital Sign   . Worried About Programme researcher, broadcasting/film/video in the Last Year: Never true   . Ran Out of Food in the Last Year: Never true  Transportation Needs: No Transportation Needs (09/02/2022)   PRAPARE - Transportation   . Lack of Transportation (Medical): No   . Lack of Transportation (Non-Medical): No  Physical Activity: Insufficiently Active (09/02/2022)   Exercise Vital Sign   . Days of Exercise per Week: 3 days   . Minutes of Exercise per Session: 10 min  Stress: No Stress Concern Present (09/02/2022)   [employer] of Occupational Health - Occupational Stress Questionnaire   . Feeling of Stress : Only a little  Social Connections: Unknown (09/02/2022)   Social Connection and Isolation Panel [NHANES]   . Frequency of Communication with Friends and Family: Patient declined   .  Frequency of Social Gatherings with Friends and Family: Patient declined   . Attends Religious Services: Patient declined   . Active Member of Clubs or Organizations: Patient declined   . Attends Banker Meetings: Not on file   . Marital Status: Married  Catering manager Violence: Not At Risk (12/24/2020)   Humiliation, Afraid, Rape, and Kick questionnaire   . Fear of Current or Ex-Partner: No   . Emotionally Abused: No   . Physically Abused: No   . Sexually Abused: No    Outpatient Medications Prior to Visit  Medication Sig Dispense Refill  . b complex vitamins tablet  Take 1 tablet by mouth daily.    . cetirizine (ZYRTEC) 10 MG tablet Take 10 mg by mouth daily.     . cholecalciferol (VITAMIN D) 1000 units tablet Take 1,000 Units by mouth daily.    . furosemide (LASIX) 20 MG tablet TAKE 1 TABLET(20 MG) BY MOUTH DAILY 90 tablet 2  . gabapentin (NEURONTIN) 300 MG capsule Take 1 capsule (300 mg total) by mouth at bedtime. 90 capsule 2  . Ketotifen Fumarate (ZADITOR OP) Place 1 drop into both eyes 2 (two) times daily as needed (itchy eyes).    Marland Kitchen levothyroxine (SYNTHROID) 25 MCG tablet Take 1 tablet (25 mcg total) by mouth daily before breakfast. 90 tablet 3  . liothyronine (CYTOMEL) 25 MCG tablet TAKE 1/2 TABLET(12.5 MCG) BY MOUTH DAILY 45 tablet 1  . busPIRone (BUSPAR) 7.5 MG tablet Take 1 tablet (7.5 mg total) by mouth 3 (three) times daily. 90 tablet 3  . ciprofloxacin (CIPRO) 250 MG tablet Take 1 tablet (250 mg total) by mouth 2 (two) times daily. 30 tablet 2  . metolazone (ZAROXOLYN) 2.5 MG tablet Take 20-30 minutes prior to am Furosemide dose 30 tablet 2  . potassium chloride SA (KLOR-CON M) 20 MEQ tablet 1 tab po tid x 3 days then bid 3 days then 1 tab daily prn 40 tablet 1   No facility-administered medications prior to visit.    Allergies  Allergen Reactions  . Penicillins Hives    Childhood allergy  Has patient had a PCN reaction causing immediate rash,  facial/tongue/throat swelling, SOB or lightheadedness with hypotension: No Has patient had a PCN reaction causing severe rash involving mucus membranes or skin necrosis: No Has patient had a PCN reaction that required hospitalization No Has patient had a PCN reaction occurring within the last 10 years: No If all of the above answers are "NO", then may proceed with Cephalosporin use.  . Sulfa Antibiotics Hives    Review of Systems  Constitutional:  Positive for malaise/fatigue. Negative for fever.  HENT:  Negative for congestion.   Eyes:  Negative for blurred vision.  Respiratory:  Negative for shortness of breath.   Cardiovascular:  Negative for chest pain, palpitations and leg swelling.  Gastrointestinal:  Negative for abdominal pain, blood in stool and nausea.  Genitourinary:  Negative for dysuria and frequency.  Musculoskeletal:  Positive for back pain. Negative for falls.  Skin:  Negative for rash.  Neurological:  Positive for tremors. Negative for dizziness, loss of consciousness and headaches.  Endo/Heme/Allergies:  Negative for environmental allergies.  Psychiatric/Behavioral:  Positive for memory loss. Negative for depression. The patient is not nervous/anxious.       Objective:    Physical Exam  BP 118/64 (BP Location: Left Arm, Patient Position: Sitting, Cuff Size: Normal)   Pulse 60   Temp 97.7 F (36.5 C) (Oral)   Resp 16   Ht 5\' 5"  (1.651 m)   Wt 99 lb 6.4 oz (45.1 kg)   SpO2 97%   BMI 16.54 kg/m  Wt Readings from Last 3 Encounters:  12/08/22 99 lb 6.4 oz (45.1 kg)  09/03/22 99 lb 9.6 oz (45.2 kg)  05/21/22 98 lb 6.4 oz (44.6 kg)    Diabetic Foot Exam - Simple   No data filed    Lab Results  Component Value Date   WBC 6.8 12/08/2022   HGB 12.5 12/08/2022   HCT 38.0 12/08/2022   PLT 300.0 12/08/2022   GLUCOSE 88 12/08/2022   CHOL 272 (H) 12/08/2022  TRIG 104.0 12/08/2022   HDL 68.60 12/08/2022   LDLCALC 183 (H) 12/08/2022   ALT 16 12/08/2022    AST 29 12/08/2022   NA 135 12/08/2022   K 3.8 12/08/2022   CL 89 (L) 12/08/2022   CREATININE 1.05 12/08/2022   BUN 17 12/08/2022   CO2 36 (H) 12/08/2022   TSH 0.20 (L) 12/08/2022   INR 0.95 10/29/2015    Lab Results  Component Value Date   TSH 0.20 (L) 12/08/2022   Lab Results  Component Value Date   WBC 6.8 12/08/2022   HGB 12.5 12/08/2022   HCT 38.0 12/08/2022   MCV 84.3 12/08/2022   PLT 300.0 12/08/2022   Lab Results  Component Value Date   NA 135 12/08/2022   K 3.8 12/08/2022   CO2 36 (H) 12/08/2022   GLUCOSE 88 12/08/2022   BUN 17 12/08/2022   CREATININE 1.05 12/08/2022   BILITOT 0.5 12/08/2022   ALKPHOS 42 12/08/2022   AST 29 12/08/2022   ALT 16 12/08/2022   PROT 6.6 12/08/2022   ALBUMIN 4.3 12/08/2022   CALCIUM 10.1 12/08/2022   ANIONGAP 9 09/19/2021   GFR 50.86 (L) 12/08/2022   Lab Results  Component Value Date   CHOL 272 (H) 12/08/2022   Lab Results  Component Value Date   HDL 68.60 12/08/2022   Lab Results  Component Value Date   LDLCALC 183 (H) 12/08/2022   Lab Results  Component Value Date   TRIG 104.0 12/08/2022   Lab Results  Component Value Date   CHOLHDL 4 12/08/2022   No results found for: "HGBA1C"     Assessment & Plan:  Back pain, unspecified back location, unspecified back pain laterality, unspecified chronicity Assessment & Plan: She struggles with daily pain but has altered her activity and manages well.   Orders: -     CBC with Differential/Platelet -     CT LUMBAR SPINE W CONTRAST; Future  Aortic atherosclerosis (HCC) Assessment & Plan: Continue to manage medically    Hyperlipidemia, unspecified hyperlipidemia type Assessment & Plan: Encourage heart healthy diet such as MIND or DASH diet, increase exercise, avoid trans fats, simple carbohydrates and processed foods, consider a krill or fish or flaxseed oil cap daily.    Orders: -     Comprehensive metabolic panel -     Lipid panel  Myalgia Assessment &  Plan: Hydrate and monitor    Thyroid disease Assessment & Plan: On Levothyroxine, continue to monitor   Orders: -     TSH -     T4, free  Nausea  Urinary hesitancy -     Urinalysis, Routine w reflex microscopic -     Urine Culture -     CBC with Differential/Platelet  Need for influenza vaccination -     Flu Vaccine Trivalent High Dose (Fluad)  Abnormal posture -     CT LUMBAR SPINE W CONTRAST; Future  Other orders -     LORazepam; Take 1 tablet (0.5 mg total) by mouth 2 (two) times daily as needed for anxiety.  Dispense: 10 tablet; Refill: 1    Assessment and Plan    Chronic Pain and Stress Increased back and hip pain, likely exacerbated by high stress levels at home. Chronic nausea and decreased appetite, possibly related to stress. No new abdominal pain or fever. -Continue current pain management regimen. -Consider counseling for stress management, offered virtual options.  Urinary Tract Infection Recent UTI treated with Clindamycin. Currently experiencing urinary hesitancy, but no  dysuria. Possible residual infection. -Collect urine for culture today to ensure infection is resolved.  Left Flank Mass Longstanding mass on left flank, recently increasing in size and tenderness. No associated redness or heat. -Order CT scan of left flank to evaluate mass. If insurance requires, may need to start with x-ray.  Medication Management Furosemide refill needed. Previous prescription for Lorazepam for anxiety, patient has two pills left and refill expired. Patient does not take Buspar anymore. -Refill Furosemide prescription. -Refill Lorazepam prescription with 10 pills and one refill.  General Health Maintenance -Administer influenza vaccine today. -Order blood work including sodium and thyroid function tests.         Danise Edge, MD

## 2022-12-11 ENCOUNTER — Other Ambulatory Visit: Payer: Self-pay | Admitting: Family Medicine

## 2022-12-17 ENCOUNTER — Encounter: Payer: Self-pay | Admitting: Family Medicine

## 2022-12-18 ENCOUNTER — Encounter: Payer: Self-pay | Admitting: Family Medicine

## 2022-12-21 ENCOUNTER — Other Ambulatory Visit: Payer: Self-pay | Admitting: Family Medicine

## 2022-12-21 DIAGNOSIS — M791 Myalgia, unspecified site: Secondary | ICD-10-CM

## 2022-12-21 DIAGNOSIS — R109 Unspecified abdominal pain: Secondary | ICD-10-CM

## 2022-12-21 DIAGNOSIS — R630 Anorexia: Secondary | ICD-10-CM

## 2022-12-21 DIAGNOSIS — R1900 Intra-abdominal and pelvic swelling, mass and lump, unspecified site: Secondary | ICD-10-CM

## 2022-12-21 DIAGNOSIS — M419 Scoliosis, unspecified: Secondary | ICD-10-CM

## 2022-12-21 DIAGNOSIS — M549 Dorsalgia, unspecified: Secondary | ICD-10-CM

## 2022-12-22 NOTE — Telephone Encounter (Signed)
Called pt lvm that Dr.Blyth has changed the CT of lumbar to CT of Abdomen. To call Our office back any question or concerns.

## 2022-12-23 ENCOUNTER — Ambulatory Visit (HOSPITAL_BASED_OUTPATIENT_CLINIC_OR_DEPARTMENT_OTHER): Admission: RE | Admit: 2022-12-23 | Payer: Medicare Other | Source: Ambulatory Visit

## 2022-12-23 ENCOUNTER — Ambulatory Visit (HOSPITAL_BASED_OUTPATIENT_CLINIC_OR_DEPARTMENT_OTHER)
Admission: RE | Admit: 2022-12-23 | Discharge: 2022-12-23 | Disposition: A | Discharge status: Home / Self Care | Payer: Medicare Other | Source: Ambulatory Visit | Attending: Family Medicine | Admitting: Family Medicine

## 2022-12-23 ENCOUNTER — Encounter (HOSPITAL_BASED_OUTPATIENT_CLINIC_OR_DEPARTMENT_OTHER): Payer: Self-pay

## 2022-12-23 DIAGNOSIS — M549 Dorsalgia, unspecified: Secondary | ICD-10-CM | POA: Insufficient documentation

## 2022-12-23 DIAGNOSIS — R109 Unspecified abdominal pain: Secondary | ICD-10-CM | POA: Insufficient documentation

## 2022-12-23 DIAGNOSIS — R1900 Intra-abdominal and pelvic swelling, mass and lump, unspecified site: Secondary | ICD-10-CM | POA: Insufficient documentation

## 2022-12-23 DIAGNOSIS — M791 Myalgia, unspecified site: Secondary | ICD-10-CM | POA: Diagnosis not present

## 2022-12-23 DIAGNOSIS — R630 Anorexia: Secondary | ICD-10-CM | POA: Insufficient documentation

## 2022-12-23 DIAGNOSIS — M419 Scoliosis, unspecified: Secondary | ICD-10-CM | POA: Insufficient documentation

## 2022-12-23 DIAGNOSIS — R634 Abnormal weight loss: Secondary | ICD-10-CM | POA: Diagnosis not present

## 2022-12-23 MED ORDER — IOHEXOL 300 MG/ML  SOLN
100.0000 mL | Freq: Once | INTRAMUSCULAR | Status: AC | PRN
  Administered 2022-12-23: 70 mL via INTRAVENOUS

## 2022-12-28 DIAGNOSIS — M5416 Radiculopathy, lumbar region: Secondary | ICD-10-CM | POA: Diagnosis not present

## 2022-12-28 DIAGNOSIS — G894 Chronic pain syndrome: Secondary | ICD-10-CM | POA: Diagnosis not present

## 2022-12-29 ENCOUNTER — Other Ambulatory Visit: Payer: Self-pay | Admitting: Family Medicine

## 2022-12-30 ENCOUNTER — Encounter: Payer: Self-pay | Admitting: Family Medicine

## 2022-12-30 ENCOUNTER — Other Ambulatory Visit: Payer: Self-pay | Admitting: Family Medicine

## 2023-01-06 DIAGNOSIS — M13831 Other specified arthritis, right wrist: Secondary | ICD-10-CM | POA: Diagnosis not present

## 2023-01-06 DIAGNOSIS — G5603 Carpal tunnel syndrome, bilateral upper limbs: Secondary | ICD-10-CM | POA: Diagnosis not present

## 2023-01-06 DIAGNOSIS — M13841 Other specified arthritis, right hand: Secondary | ICD-10-CM | POA: Diagnosis not present

## 2023-01-31 ENCOUNTER — Encounter: Payer: Self-pay | Admitting: Family Medicine

## 2023-02-02 ENCOUNTER — Other Ambulatory Visit: Payer: Self-pay | Admitting: Family Medicine

## 2023-02-02 ENCOUNTER — Telehealth: Payer: Self-pay

## 2023-02-02 MED ORDER — LORAZEPAM 0.5 MG PO TABS: 0.5000 mg | ORAL_TABLET | Freq: Two times a day (BID) | ORAL | 1 refills | Status: DC | PRN

## 2023-02-02 MED ORDER — FLUOXETINE HCL 10 MG PO TABS: 10.0000 mg | ORAL_TABLET | Freq: Every day | ORAL | 3 refills | Status: DC

## 2023-02-02 NOTE — Telephone Encounter (Signed)
Called pt lvm about medication and to let us know  if she is ok with sending it in. Call the office to schedule  appt to discuss anxiety and let us know about med.

## 2023-02-02 NOTE — Telephone Encounter (Signed)
Pt said that she is okay with recommendation but she is totally out of Lorazepam and needs the refill to be sent in as soon as possible to the [facility] [address]

## 2023-02-03 NOTE — Telephone Encounter (Signed)
Patient notified of prescription refill.

## 2023-03-07 NOTE — Assessment & Plan Note (Signed)
Encouraged good sleep hygiene such as dark, quiet room. No blue/green glowing lights such as computer screens in bedroom. No alcohol or stimulants in evening. Cut down on caffeine as able. Regular exercise is helpful but not just prior to bed time.  

## 2023-03-07 NOTE — Assessment & Plan Note (Signed)
Encourage heart healthy diet such as MIND or DASH diet, increase exercise, avoid trans fats, simple carbohydrates and processed foods, consider a krill or fish or flaxseed oil cap daily.  °

## 2023-03-07 NOTE — Assessment & Plan Note (Signed)
She struggles with daily pain but has altered her activity and manages

## 2023-03-09 ENCOUNTER — Ambulatory Visit (INDEPENDENT_AMBULATORY_CARE_PROVIDER_SITE_OTHER): Payer: Medicare Other | Admitting: Family Medicine

## 2023-03-09 VITALS — BP 136/76 | HR 61 | Temp 97.8°F | Resp 16 | Ht 64.0 in | Wt 102.4 lb

## 2023-03-09 DIAGNOSIS — F418 Other specified anxiety disorders: Secondary | ICD-10-CM | POA: Diagnosis not present

## 2023-03-09 DIAGNOSIS — E0590 Thyrotoxicosis, unspecified without thyrotoxic crisis or storm: Secondary | ICD-10-CM | POA: Diagnosis not present

## 2023-03-09 DIAGNOSIS — G4700 Insomnia, unspecified: Secondary | ICD-10-CM

## 2023-03-09 DIAGNOSIS — E785 Hyperlipidemia, unspecified: Secondary | ICD-10-CM | POA: Diagnosis not present

## 2023-03-09 DIAGNOSIS — M549 Dorsalgia, unspecified: Secondary | ICD-10-CM | POA: Diagnosis not present

## 2023-03-09 DIAGNOSIS — R7989 Other specified abnormal findings of blood chemistry: Secondary | ICD-10-CM

## 2023-03-09 DIAGNOSIS — E079 Disorder of thyroid, unspecified: Secondary | ICD-10-CM

## 2023-03-09 LAB — CBC WITH DIFFERENTIAL/PLATELET
Basophils Absolute: 0 10*3/uL (ref 0.0–0.1)
Basophils Relative: 0.7 % (ref 0.0–3.0)
Eosinophils Absolute: 0.1 10*3/uL (ref 0.0–0.7)
Eosinophils Relative: 2 % (ref 0.0–5.0)
HCT: 40.4 % (ref 36.0–46.0)
Hemoglobin: 13.4 g/dL (ref 12.0–15.0)
Lymphocytes Relative: 26.2 % (ref 12.0–46.0)
Lymphs Abs: 1.7 10*3/uL (ref 0.7–4.0)
MCHC: 33.3 g/dL (ref 30.0–36.0)
MCV: 85.2 fL (ref 78.0–100.0)
Monocytes Absolute: 0.6 10*3/uL (ref 0.1–1.0)
Monocytes Relative: 8.9 % (ref 3.0–12.0)
Neutro Abs: 3.9 10*3/uL (ref 1.4–7.7)
Neutrophils Relative %: 62.2 % (ref 43.0–77.0)
Platelets: 253 10*3/uL (ref 150.0–400.0)
RBC: 4.74 Mil/uL (ref 3.87–5.11)
RDW: 15.4 % (ref 11.5–15.5)
WBC: 6.4 10*3/uL (ref 4.0–10.5)

## 2023-03-09 LAB — COMPREHENSIVE METABOLIC PANEL
ALT: 16 U/L (ref 0–35)
AST: 25 U/L (ref 0–37)
Albumin: 4.8 g/dL (ref 3.5–5.2)
Alkaline Phosphatase: 46 U/L (ref 39–117)
BUN: 22 mg/dL (ref 6–23)
CO2: 35 meq/L — ABNORMAL HIGH (ref 19–32)
Calcium: 10.2 mg/dL (ref 8.4–10.5)
Chloride: 97 meq/L (ref 96–112)
Creatinine, Ser: 1.08 mg/dL (ref 0.40–1.20)
GFR: 49.08 mL/min — ABNORMAL LOW (ref >60.00)
Glucose, Bld: 88 mg/dL (ref 70–99)
Potassium: 4.1 meq/L (ref 3.5–5.1)
Sodium: 139 meq/L (ref 135–145)
Total Bilirubin: 0.6 mg/dL (ref 0.2–1.2)
Total Protein: 7.1 g/dL (ref 6.0–8.3)

## 2023-03-09 LAB — LIPID PANEL
Cholesterol: 298 mg/dL — ABNORMAL HIGH (ref 0–200)
HDL: 84.8 mg/dL (ref >39.00)
LDL Cholesterol: 192 mg/dL — ABNORMAL HIGH (ref 0–99)
NonHDL: 213.07
Total CHOL/HDL Ratio: 4
Triglycerides: 103 mg/dL (ref 0.0–149.0)
VLDL: 20.6 mg/dL (ref 0.0–40.0)

## 2023-03-09 LAB — T4, FREE: Free T4: 0.47 ng/dL — ABNORMAL LOW (ref 0.60–1.60)

## 2023-03-09 LAB — TSH: TSH: 0.16 u[IU]/mL — ABNORMAL LOW (ref 0.35–5.50)

## 2023-03-09 MED ORDER — LORAZEPAM 0.5 MG PO TABS: 0.5000 mg | ORAL_TABLET | Freq: Two times a day (BID) | ORAL | 1 refills | Status: DC | PRN

## 2023-03-09 MED ORDER — FLUTICASONE PROPIONATE 50 MCG/ACT NA SUSP: 2.0000 | Freq: Every day | NASAL | Status: AC

## 2023-03-09 NOTE — Patient Instructions (Signed)
Nasal saline in am  Zyrtec daily   Allergies, Adult An allergy is a condition that causes the body's defense system (immune system) to react too strongly to an allergen. An allergen is a substance that is harmless to most people but can cause a reaction in some people. Allergies often affect the nose (allergic rhinitis), eyes (conjunctivitis), skin (atopic dermatitis), and stomach. They can be mild, moderate, or severe. They cannot spread from person to person. Allergies can start at any age. In some cases, they may go away as you get older. What are the causes? Allergies are caused by allergens. These may be: Outdoor allergens. These include pollen, car fumes, and mold. Indoor allergens. These include dust, smoke, mold, and pet dander. Other allergens. These include foods, medicines, scents, and insect bites or stings. What increases the risk? You are more likely to have allergies if you have: Family members with allergies. Family members who have a condition that may be caused by allergens, such as asthma. What are the signs or symptoms? Symptoms depend on how severe your allergy is. Mild to moderate symptoms Runny nose, stuffy nose (nasal congestion), or sneezing. Itchy mouth, ears, or throat. Postnasal drip. This is a feeling of mucus dripping down the back of your throat. Sore throat. Itchy, red, watery, or puffy eyes. Skin rash, or itchy, red, swollen areas of skin (hives). Stomach cramps or bloating. Severe symptoms A bad allergy to food, medicine, or insect bites may cause a severe reaction (anaphylactic reaction). Symptoms include: A red face. Coughing or making high-pitched whistling sounds when you breathe, most often when you breathe out (wheezing). Swollen lips, tongue, or mouth. A tight or swollen throat. Chest pain or tightness, or a fast heartbeat. Trouble breathing or shortness of breath. Pain in your abdomen. Vomiting or diarrhea. Feeling dizzy or fainting. How  is this diagnosed? Allergies are diagnosed based on your symptoms, your family and medical history, and a physical exam. You may also have tests, such as: Skin tests. These may be done to see how your skin reacts to allergens. Tests include: Skin prick test. For this test, the allergen is put in your body through a small prick in the skin. Intradermal skin test. For this test, a small amount of the allergen is put under the first layer of your skin. Patch test. For this test, a small amount of the allergen is placed on your skin. The area is covered and then checked after a few days. Blood tests. A challenge test. For this test, you eat or breathe in the allergen to see if you have a reaction. You may also be asked to: Keep a food diary. This means writing down all the foods, drinks, and symptoms you have in a day. Try an elimination diet. To do this: Stop eating certain foods. Add those foods back one by one to find out if any of them cause a reaction. How is this treated?     Treatment for allergies depends on your symptoms. It may include: Cold, wet cloths (cold compresses). These can be used to soothe itching and swelling. Eye drops or nasal sprays. A saline solution to clear out your nose and keep it moist (nasal irrigation). A saline solution is made of salt and water. A humidifier. This can add moisture to the air. Skin creams. These can treat rashes or itching. Diet changes to cut out foods that cause allergies. Being exposed again and again to tiny amounts of allergens. This can help your  body build a defense against them (tolerance). This process is called immunotherapy. It may be done using: Allergy shots. This is when you get a shot of the allergen. Sublingual immunotherapy. This is when you take a small dose of the allergen under your tongue. Allergy medicines (antihistamines) or other medicines. These can help block the allergic reaction. Using an auto-injector pen. An  auto-injector pen is a device filled with medicine that gives an emergency shot of epinephrine. Your health care provider will teach you how to use it. Follow these instructions at home: Medicines  Take or apply over-the-counter and prescription medicines only as told by your provider. Always carry your auto-injector pen if you are at risk of an anaphylactic reaction. Give yourself the shot as told by your provider. Eating and drinking Follow instructions from your provider about what you may eat and drink. Drink enough fluid to keep your pee (urine) pale yellow. General instructions Wear a medical alert bracelet or necklace if you have had an anaphylactic reaction in the past. Avoid known allergens when you can. Keep all follow-up visits. Your provider will watch your symptoms and talk about treatment options with you. Contact a health care provider if: Your symptoms do not get better with treatment. Get help right away if: You have any symptoms of anaphylactic reaction. You use an auto-injector pen. You will need more medical care even if the medicine seems to be working. An anaphylactic reaction may happen again within 72 hours (rebound anaphylaxis). These symptoms may be an emergency. Use the auto-injector pen right away. Then call 911. Do not wait to see if the symptoms will go away. Do not drive yourself to the hospital. This information is not intended to replace advice given to you by your health care provider. Make sure you discuss any questions you have with your health care provider. Document Revised: 12/10/2021 Document Reviewed: 12/10/2021 Elsevier Patient Education  2024 [employer].

## 2023-03-10 ENCOUNTER — Other Ambulatory Visit: Payer: Self-pay | Admitting: Emergency Medicine

## 2023-03-10 ENCOUNTER — Encounter: Payer: Self-pay | Admitting: Family Medicine

## 2023-03-10 DIAGNOSIS — E079 Disorder of thyroid, unspecified: Secondary | ICD-10-CM

## 2023-03-10 NOTE — Progress Notes (Signed)
Subjective:    [name], female    DOB: 1945/06/12, 77 y.o.   MRN: [id number]  Chief Complaint  Patient presents with  . Follow-up    HPI Discussed the use of AI scribe software for clinical note transcription with the patient, who gave verbal consent to proceed.  History of Present Illness   The patient, with a history of chronic back pain, Raynaud's disease, and severe carpal tunnel syndrome, presents for a routine follow-up. They report that their back pain has been worsening and their current pain medication, hydrocodone, is not providing relief. They also mention that they have been on hydrocodone for twenty years. 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  In addition to their chronic conditions, the patient is experiencing severe sinus issues. They report that their ears are filled with fluid and they are unable to clear them. Over-the-counter medications like Sudafed have not been effective.  The patient also mentions emotional stress due to family issues, including the death of a brother and a son with end-stage renal disease. They have been taking lorazepam as needed for anxiety and stress but report that fluoxetine, which was previously prescribed, caused adverse side effects and was discontinued.        Past Medical History:  Diagnosis Date  . Abdominal pain 02/01/2015  . Allergy    "seasonal allergies"  . Anemia   . Arthritis    osteoarthritis-"DDD" hips. fingers, toes.  . Blood transfusion without reported diagnosis   . Complication of anesthesia   . Depression   . Depression with anxiety   . Frequent headaches    no problems now.  Marland Kitchen Hearing loss in right ear 03/10/2015   same over last 6 months  . Hip dislocation, right (HCC)   . History of chicken pox   . Hyperlipidemia    pt. state no hx  . Hypokalemia  02/04/2017  . Hypothyroidism    supplement used  . Osteoporosis   . Peripheral neuropathy 02/01/2015   right hand"carpal tunnel"  . PONV (postoperative nausea and vomiting)   . Preventative health care 02/04/2017  . Right hip pain 05/24/2015  . Scoliosis 02/01/2015  . Spinal stenosis of lumbar region 10/23/2016  . Thyroid disease   . URI, acute 03/10/2015  . UTI (urinary tract infection) 12/14/2016  . 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 ACETABULAR REVISION Right 10/30/2015   Procedure: RIGHT HIP ACETABULAR REVISION /CONSTRAINED LINER (POSTERIOR APPROACH) ;  Surgeon: Ollen Gross, MD;  Location: [facility];  Service: Orthopedics;  Laterality: Right;  . BREAST EXCISIONAL BIOPSY Right   . BREAST SURGERY     lump removed, and breast reduction  . EYE SURGERY     lasik   2002  . HIP CLOSED REDUCTION Right 06/30/2015   Procedure: CLOSED MANIPULATION HIP;  Surgeon: Venita Lick, MD;  Location: [facility];  Service: Orthopedics;  Laterality: Right;  . JOINT REPLACEMENT     3 hips on right and 1 on left  . METACARPOPHALANGEAL JOINT CAPSULECTOMY / CAPSULOTOMY  1995  . REDUCTION MAMMAPLASTY    . SPINAL CORD STIMULATOR INSERTION N/A 12/02/2016   Procedure: LUMBAR SPINAL CORD STIMULATOR INSERTION;  Surgeon: Venita Lick, MD;  Location: [facility];  Service: Orthopedics;  Laterality: N/A;  2.5 hrs  . SPINAL CORD STIMULATOR REMOVAL N/A 12/31/2016   Procedure: LUMBAR SPINAL CORD STIMULATOR REMOVAL;  Surgeon: Venita Lick, MD;  Location: [facility];  Service: Orthopedics;  Laterality: N/A;  60 mins  . TONSILLECTOMY    . TOTAL HIP ARTHROPLASTY  2011   left hip  . TOTAL HIP ARTHROPLASTY Right 06/12/2015   Procedure: RIGHT TOTAL HIP ARTHROPLASTY ANTERIOR APPROACH;  Surgeon: Ollen Gross, MD;  Location: [facility];  Service: Orthopedics;  Laterality: Right;  . TOTAL HIP REVISION Right 07/19/2015   Procedure: RIGHT HIP BEARING SURFACE REVISION;  Surgeon:  Ollen Gross, MD;  Location: [facility];  Service: Orthopedics;  Laterality: Right;  . TUBAL LIGATION  1980   left fallopian tube removal    Family History  Problem Relation Age of Onset  . Arthritis Mother   . Dementia Mother   . Mental illness Father        suicide  . Arthritis Maternal Grandmother   . Kidney disease Son        b/l multicystic kidney disease s/p 4 transplants  . Colon cancer Neg Hx     Social History   Socioeconomic History  . Marital status: Married    Spouse name: Not on file  . Number of children: Not on file  . Years of education: 55  . Highest education level: Bachelor's degree (e.g., BA, AB, BS)  Occupational History  . Occupation: retired  Tobacco Use  . Smoking status: Never  . Smokeless tobacco: Never  Vaping Use  . Vaping status: Never Used  Substance and Sexual Activity  . Alcohol use: Yes    Alcohol/week: 1.0 standard drink of alcohol    Types: 1 Glasses of wine per week  . Drug use: No  . 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 Not on file  Social History Narrative  . Not on file   Social Determinants of Health   Financial Resource Strain: Low Risk  (03/02/2023)   Overall Financial Resource Strain (CARDIA)   . Difficulty of Paying Living Expenses: Not hard at all  Food Insecurity: No Food Insecurity (03/02/2023)   Hunger Vital Sign   . Worried About Programme researcher, broadcasting/film/video in the Last Year: Never true   . Ran Out of Food in the Last Year: Never true  Transportation Needs: No Transportation Needs (03/02/2023)   PRAPARE - Transportation   . Lack of Transportation (Medical): No   . Lack of Transportation (Non-Medical): No  Physical Activity: Insufficiently Active (03/02/2023)   Exercise Vital Sign   . Days of Exercise per Week: 2 days   . Minutes of Exercise per Session: 20 min  Stress: Stress Concern Present (03/02/2023)   [employer] of Occupational Health - Occupational Stress Questionnaire   . Feeling of Stress : Rather much  Social Connections: Moderately Isolated (03/02/2023)   Social Connection and Isolation Panel [NHANES]   . Frequency of Communication with Friends and Family: More than three times a week   . Frequency of Social Gatherings with Friends and Family: Never   . Attends Religious Services: Never   . Active Member of Clubs or Organizations: No   . Attends Banker Meetings: Not on file   . Marital Status: Married  Catering manager Violence: Not At Risk (12/24/2020)   Humiliation, Afraid, Rape, and Kick questionnaire   . Fear of Current or Ex-Partner:  No   . Emotionally Abused: No   . Physically Abused: No   . Sexually Abused: No    Outpatient Medications Prior to Visit  Medication Sig Dispense Refill  . b complex vitamins tablet Take 1 tablet by mouth daily.    . cholecalciferol (VITAMIN D) 1000 units tablet Take 1,000 Units by mouth daily.    . furosemide (LASIX) 20 MG tablet TAKE 1 TABLET(20 MG) BY MOUTH DAILY 90 tablet 2  . gabapentin (NEURONTIN) 300 MG capsule Take 1 capsule (300 mg total) by mouth at bedtime. 90 capsule 2  . HYDROcodone-acetaminophen (NORCO) 10-325 MG tablet Take 1 tablet by mouth every 6 (six) hours as needed.    Marland Kitchen Ketotifen Fumarate (ZADITOR OP) Place 1 drop into both eyes 2 (two) times daily as needed (itchy eyes).    Marland Kitchen levothyroxine (SYNTHROID) 25 MCG tablet TAKE 1 TABLET(25 MCG) BY MOUTH DAILY BEFORE BREAKFAST 90 tablet 3  . liothyronine (CYTOMEL) 25 MCG tablet TAKE 1/2 TABLET(12.5 MCG) BY MOUTH DAILY 45 tablet 1  . LORazepam (ATIVAN) 0.5 MG tablet Take 1-2 tablets (0.5-1 mg total) by mouth 2 (two) times daily as needed for anxiety. 60 tablet 1  . cetirizine (ZYRTEC) 10 MG tablet Take 10 mg by mouth daily.     Marland Kitchen FLUoxetine (PROZAC) 10 MG tablet Take 1 tablet (10 mg total) by mouth daily. 30 tablet 3   No facility-administered medications prior to  visit.    Allergies  Allergen Reactions  . Penicillins Hives    Childhood allergy  Has patient had a PCN reaction causing immediate rash, facial/tongue/throat swelling, SOB or lightheadedness with hypotension: No Has patient had a PCN reaction causing severe rash involving mucus membranes or skin necrosis: No Has patient had a PCN reaction that required hospitalization No Has patient had a PCN reaction occurring within the last 10 years: No If all of the above answers are "NO", then may proceed with Cephalosporin use.  . Sulfa Antibiotics Hives    Review of Systems  Constitutional:  Positive for malaise/fatigue. Negative for fever.  HENT:  Positive for congestion.   Eyes:  Negative for blurred vision.  Respiratory:  Negative for shortness of breath.   Cardiovascular:  Negative for chest pain, palpitations and leg swelling.  Gastrointestinal:  Negative for abdominal pain, blood in stool and nausea.  Genitourinary:  Negative for dysuria and frequency.  Musculoskeletal:  Positive for back pain and joint pain. Negative for falls.  Skin:  Negative for rash.  Neurological:  Negative for dizziness, loss of consciousness and headaches.  Endo/Heme/Allergies:  Negative for environmental allergies.  Psychiatric/Behavioral:  Positive for depression. The patient is nervous/anxious.       Objective:    Physical Exam Constitutional:      General: She is not in acute distress.    Appearance: Normal appearance. She is well-developed. She is not toxic-appearing.     Comments: frail  HENT:     Head: Normocephalic and atraumatic.     Right Ear: External ear normal.     Left Ear: External ear normal.     Nose: Nose normal.  Eyes:     General:        Right eye: No discharge.        Left eye: No discharge.     Conjunctiva/sclera: Conjunctivae normal.  Neck:     Thyroid: No thyromegaly.  Cardiovascular:     Rate and Rhythm: Normal rate and regular rhythm.     Heart sounds: Normal  heart  sounds. No murmur heard. Pulmonary:     Effort: Pulmonary effort is normal. No respiratory distress.     Breath sounds: Normal breath sounds.  Abdominal:     General: Bowel sounds are normal.     Palpations: Abdomen is soft.     Tenderness: There is no abdominal tenderness. There is no guarding.  Musculoskeletal:        General: Normal range of motion.     Cervical back: Neck supple.  Lymphadenopathy:     Cervical: No cervical adenopathy.  Skin:    General: Skin is warm and dry.  Neurological:     Mental Status: She is alert and oriented to person, place, and time.  Psychiatric:        Mood and Affect: Mood normal.        Behavior: Behavior normal.        Thought Content: Thought content normal.        Judgment: Judgment normal.   BP 136/76 (BP Location: Left Arm, Patient Position: Sitting, Cuff Size: Normal)   Pulse 61   Temp 97.8 F (36.6 C) (Oral)   Resp 16   Ht 5\' 4"  (1.626 m)   Wt 102 lb 6.4 oz (46.4 kg)   SpO2 100%   BMI 17.58 kg/m  Wt Readings from Last 3 Encounters:  03/09/23 102 lb 6.4 oz (46.4 kg)  12/08/22 99 lb 6.4 oz (45.1 kg)  09/03/22 99 lb 9.6 oz (45.2 kg)    Diabetic Foot Exam - Simple   No data filed    Lab Results  Component Value Date   WBC 6.4 03/09/2023   HGB 13.4 03/09/2023   HCT 40.4 03/09/2023   PLT 253.0 03/09/2023   GLUCOSE 88 03/09/2023   CHOL 298 (H) 03/09/2023   TRIG 103.0 03/09/2023   HDL 84.80 03/09/2023   LDLCALC 192 (H) 03/09/2023   ALT 16 03/09/2023   AST 25 03/09/2023   NA 139 03/09/2023   K 4.1 03/09/2023   CL 97 03/09/2023   CREATININE 1.08 03/09/2023   BUN 22 03/09/2023   CO2 35 (H) 03/09/2023   TSH 0.16 (L) 03/09/2023   INR 0.95 10/29/2015    Lab Results  Component Value Date   TSH 0.16 (L) 03/09/2023   Lab Results  Component Value Date   WBC 6.4 03/09/2023   HGB 13.4 03/09/2023   HCT 40.4 03/09/2023   MCV 85.2 03/09/2023   PLT 253.0 03/09/2023   Lab Results  Component Value Date   NA 139  03/09/2023   K 4.1 03/09/2023   CO2 35 (H) 03/09/2023   GLUCOSE 88 03/09/2023   BUN 22 03/09/2023   CREATININE 1.08 03/09/2023   BILITOT 0.6 03/09/2023   ALKPHOS 46 03/09/2023   AST 25 03/09/2023   ALT 16 03/09/2023   PROT 7.1 03/09/2023   ALBUMIN 4.8 03/09/2023   CALCIUM 10.2 03/09/2023   ANIONGAP 9 09/19/2021   GFR 49.08 (L) 03/09/2023   Lab Results  Component Value Date   CHOL 298 (H) 03/09/2023   Lab Results  Component Value Date   HDL 84.80 03/09/2023   Lab Results  Component Value Date   LDLCALC 192 (H) 03/09/2023   Lab Results  Component Value Date   TRIG 103.0 03/09/2023   Lab Results  Component Value Date   CHOLHDL 4 03/09/2023   No results found for: "HGBA1C"     Assessment & Plan:  Back pain, unspecified back location, unspecified back pain laterality,  unspecified chronicity Assessment & Plan: She struggles with daily pain but has altered her activity and manages   Orders: -     CBC with Differential/Platelet -     Comprehensive metabolic panel -     TSH -     T4, free  Hyperlipidemia, unspecified hyperlipidemia type Assessment & Plan: Encourage heart healthy diet such as MIND or DASH diet, increase exercise, avoid trans fats, simple carbohydrates and processed foods, consider a krill or fish or flaxseed oil cap daily.    Orders: -     Comprehensive metabolic panel -     Lipid panel  Insomnia, unspecified type Assessment & Plan: Encouraged good sleep hygiene such as dark, quiet room. No blue/green glowing lights such as computer screens in bedroom. No alcohol or stimulants in evening. Cut down on caffeine as able. Regular exercise is helpful but not just prior to bed time.    Depression with anxiety -     Drug Monitoring Panel [phone] , Urine  Abnormal TSH -     TSH -     T4, free  Thyroid disease  Hyperthyroidism -     TSH -     T4, free  Other orders -     LORazepam; Take 1-2 tablets (0.5-1 mg total) by mouth 2 (two) times daily  as needed for anxiety.  Dispense: 60 tablet; Refill: 1 -     Fluticasone Propionate; Place 2 sprays into both nostrils daily.    Assessment and Plan    Chronic Back Pain Worsening pain despite hydrocodone 10/325mg  up to 4 times daily. Discussed limitations of narcotic therapy for chronic pain and potential alternatives. -Continue current regimen.  Anxiety Patient finds relief from intermittent use of lorazepam. -Refill lorazepam as needed. -Consider alternative mood stabilizers in the future if needed.  Sinusitis Complaints of fullness in ears and inability to clear them. No signs of active infection on examination. -Resume Zyrtec. -Add nasal saline in the morning. -Consider referral to ENT or trial of antibiotics if symptoms worsen.  Hyperthyroidism Previous lab work showed thyroid levels were off. -Order repeat thyroid function tests.  Carpal Tunnel Syndrome and Raynaud's Severe symptoms leading to planned surgery in January. -Continue with planned surgery.  Physical Therapy Interest in strengthening back and legs. -Consider referral to [facility] or patient's choice of provider.  General Health Maintenance -Administered flu shot today. -Follow-up in 3 months or sooner if needed.         Danise Edge, MD

## 2023-03-11 LAB — DRUG MONITORING PANEL 376104, URINE
Amphetamines: NEGATIVE ng/mL (ref <500)
Barbiturates: NEGATIVE ng/mL (ref <300)
Benzodiazepines: NEGATIVE ng/mL (ref <100)
Cocaine Metabolite: NEGATIVE ng/mL (ref <150)
Codeine: NEGATIVE ng/mL (ref <50)
Desmethyltramadol: NEGATIVE ng/mL (ref <100)
Hydrocodone: 765 ng/mL — ABNORMAL HIGH (ref <50)
Hydromorphone: 156 ng/mL — ABNORMAL HIGH (ref <50)
Morphine: NEGATIVE ng/mL (ref <50)
Norhydrocodone: 1427 ng/mL — ABNORMAL HIGH (ref <50)
Noroxycodone: 322 ng/mL — ABNORMAL HIGH (ref <50)
Opiates: POSITIVE ng/mL — AB (ref <100)
Oxycodone: NEGATIVE ng/mL (ref <50)
Oxycodone: POSITIVE ng/mL — AB (ref <100)
Oxymorphone: 93 ng/mL — ABNORMAL HIGH (ref <50)
Tramadol: NEGATIVE ng/mL (ref <100)

## 2023-03-11 LAB — DM TEMPLATE

## 2023-03-22 ENCOUNTER — Encounter: Payer: Self-pay | Admitting: *Deleted

## 2023-04-26 DIAGNOSIS — M5416 Radiculopathy, lumbar region: Secondary | ICD-10-CM | POA: Diagnosis not present

## 2023-04-26 DIAGNOSIS — G894 Chronic pain syndrome: Secondary | ICD-10-CM | POA: Diagnosis not present

## 2023-04-27 DIAGNOSIS — G5601 Carpal tunnel syndrome, right upper limb: Secondary | ICD-10-CM | POA: Diagnosis not present

## 2023-04-27 DIAGNOSIS — M62541 Muscle wasting and atrophy, not elsewhere classified, right hand: Secondary | ICD-10-CM | POA: Diagnosis not present

## 2023-04-27 DIAGNOSIS — M62521 Muscle wasting and atrophy, not elsewhere classified, right upper arm: Secondary | ICD-10-CM | POA: Diagnosis not present

## 2023-04-27 DIAGNOSIS — M13841 Other specified arthritis, right hand: Secondary | ICD-10-CM | POA: Diagnosis not present

## 2023-04-27 DIAGNOSIS — M19041 Primary osteoarthritis, right hand: Secondary | ICD-10-CM | POA: Diagnosis not present

## 2023-04-27 DIAGNOSIS — M778 Other enthesopathies, not elsewhere classified: Secondary | ICD-10-CM | POA: Diagnosis not present

## 2023-05-24 DIAGNOSIS — Z4789 Encounter for other orthopedic aftercare: Secondary | ICD-10-CM | POA: Diagnosis not present

## 2023-05-24 DIAGNOSIS — M25641 Stiffness of right hand, not elsewhere classified: Secondary | ICD-10-CM | POA: Diagnosis not present

## 2023-05-24 DIAGNOSIS — M79641 Pain in right hand: Secondary | ICD-10-CM | POA: Diagnosis not present

## 2023-05-24 DIAGNOSIS — G5603 Carpal tunnel syndrome, bilateral upper limbs: Secondary | ICD-10-CM | POA: Diagnosis not present

## 2023-06-01 ENCOUNTER — Encounter: Payer: Self-pay | Admitting: Family Medicine

## 2023-06-01 DIAGNOSIS — M25641 Stiffness of right hand, not elsewhere classified: Secondary | ICD-10-CM | POA: Diagnosis not present

## 2023-06-02 ENCOUNTER — Other Ambulatory Visit: Payer: Self-pay | Admitting: Family Medicine

## 2023-06-02 ENCOUNTER — Other Ambulatory Visit: Payer: Self-pay | Admitting: *Deleted

## 2023-06-02 DIAGNOSIS — M7989 Other specified soft tissue disorders: Secondary | ICD-10-CM

## 2023-06-02 MED ORDER — METOLAZONE 2.5 MG PO TABS: ORAL_TABLET | ORAL | 0 refills | Status: DC

## 2023-06-03 NOTE — Telephone Encounter (Signed)
Called and spoke with patient. Someone else had already called and given her the provider's new instructions. Lab appointment was made today for next week.

## 2023-06-08 DIAGNOSIS — M25641 Stiffness of right hand, not elsewhere classified: Secondary | ICD-10-CM | POA: Diagnosis not present

## 2023-06-10 ENCOUNTER — Other Ambulatory Visit (INDEPENDENT_AMBULATORY_CARE_PROVIDER_SITE_OTHER): Payer: Medicare Other

## 2023-06-10 DIAGNOSIS — E079 Disorder of thyroid, unspecified: Secondary | ICD-10-CM

## 2023-06-10 DIAGNOSIS — M7989 Other specified soft tissue disorders: Secondary | ICD-10-CM | POA: Diagnosis not present

## 2023-06-10 LAB — COMPREHENSIVE METABOLIC PANEL
ALT: 15 U/L (ref 0–35)
AST: 27 U/L (ref 0–37)
Albumin: 4.6 g/dL (ref 3.5–5.2)
Alkaline Phosphatase: 47 U/L (ref 39–117)
BUN: 23 mg/dL (ref 6–23)
CO2: 36 meq/L — ABNORMAL HIGH (ref 19–32)
Calcium: 10 mg/dL (ref 8.4–10.5)
Chloride: 91 meq/L — ABNORMAL LOW (ref 96–112)
Creatinine, Ser: 1.27 mg/dL — ABNORMAL HIGH (ref 0.40–1.20)
GFR: 40.33 mL/min — ABNORMAL LOW (ref >60.00)
Glucose, Bld: 91 mg/dL (ref 70–99)
Potassium: 3.1 meq/L — ABNORMAL LOW (ref 3.5–5.1)
Sodium: 137 meq/L (ref 135–145)
Total Bilirubin: 0.5 mg/dL (ref 0.2–1.2)
Total Protein: 7 g/dL (ref 6.0–8.3)

## 2023-06-10 LAB — TSH: TSH: 0.44 u[IU]/mL (ref 0.35–5.50)

## 2023-06-13 ENCOUNTER — Encounter: Payer: Self-pay | Admitting: Family Medicine

## 2023-06-15 ENCOUNTER — Other Ambulatory Visit: Payer: Self-pay | Admitting: Emergency Medicine

## 2023-06-15 ENCOUNTER — Other Ambulatory Visit: Payer: Self-pay | Admitting: Family Medicine

## 2023-06-15 ENCOUNTER — Telehealth: Payer: Self-pay | Admitting: Neurology

## 2023-06-15 DIAGNOSIS — M25641 Stiffness of right hand, not elsewhere classified: Secondary | ICD-10-CM | POA: Diagnosis not present

## 2023-06-15 DIAGNOSIS — M25631 Stiffness of right wrist, not elsewhere classified: Secondary | ICD-10-CM | POA: Diagnosis not present

## 2023-06-15 DIAGNOSIS — E785 Hyperlipidemia, unspecified: Secondary | ICD-10-CM

## 2023-06-15 NOTE — Telephone Encounter (Signed)
 Copied from CRM [id number]. Topic: Clinical - Request for Lab/Test Order >> Jun 15, 2023  8:56 AM Orinda Kenner C wrote: Reason for CRM: Patient [phone] wants to come in for labs today instead of tomorrow. Per CAL, informed patient, labs order is not ordered yet. Patient want to speak with nurse, please call back.

## 2023-06-15 NOTE — Telephone Encounter (Signed)
 Called patient and let her know the lab was put in this morning. Not sure what screen the agent she spoke to was looking at. She said she will just come in at scheduled time tomorrow.

## 2023-06-16 ENCOUNTER — Other Ambulatory Visit

## 2023-06-17 ENCOUNTER — Encounter: Payer: Self-pay | Admitting: Family Medicine

## 2023-06-17 ENCOUNTER — Encounter: Payer: Self-pay | Admitting: Family

## 2023-06-17 ENCOUNTER — Telehealth: Payer: Self-pay

## 2023-06-17 ENCOUNTER — Other Ambulatory Visit: Payer: Self-pay | Admitting: Family

## 2023-06-17 ENCOUNTER — Other Ambulatory Visit (INDEPENDENT_AMBULATORY_CARE_PROVIDER_SITE_OTHER)

## 2023-06-17 ENCOUNTER — Other Ambulatory Visit: Payer: Self-pay | Admitting: Family Medicine

## 2023-06-17 DIAGNOSIS — E785 Hyperlipidemia, unspecified: Secondary | ICD-10-CM | POA: Diagnosis not present

## 2023-06-17 LAB — COMPREHENSIVE METABOLIC PANEL
ALT: 15 U/L (ref 0–35)
AST: 29 U/L (ref 0–37)
Albumin: 4.7 g/dL (ref 3.5–5.2)
Alkaline Phosphatase: 47 U/L (ref 39–117)
BUN: 29 mg/dL — ABNORMAL HIGH (ref 6–23)
CO2: 38 meq/L — ABNORMAL HIGH (ref 19–32)
Calcium: 10.4 mg/dL (ref 8.4–10.5)
Chloride: 88 meq/L — ABNORMAL LOW (ref 96–112)
Creatinine, Ser: 1.09 mg/dL (ref 0.40–1.20)
GFR: 48.45 mL/min — ABNORMAL LOW (ref >60.00)
Glucose, Bld: 91 mg/dL (ref 70–99)
Potassium: 2.8 meq/L — CL (ref 3.5–5.1)
Sodium: 136 meq/L (ref 135–145)
Total Bilirubin: 0.6 mg/dL (ref 0.2–1.2)
Total Protein: 7 g/dL (ref 6.0–8.3)

## 2023-06-17 MED ORDER — POTASSIUM CHLORIDE CRYS ER 20 MEQ PO TBCR: 20.0000 meq | EXTENDED_RELEASE_TABLET | Freq: Two times a day (BID) | ORAL | 3 refills | Status: DC

## 2023-06-17 NOTE — Telephone Encounter (Signed)
 CRITICAL VALUE STICKER  CRITICAL VALUE: Potassium 2.8  RECEIVER (on-site recipient of call): Melton Alar RMA  DATE & TIME NOTIFIED:  2:00 PM, 06/17/23  MESSENGER (representative from lab): Curley Spice   MD NOTIFIED: PCP  TIME OF NOTIFICATION: 2:00 PM, 06/17/23  RESPONSE:  Pending

## 2023-06-18 ENCOUNTER — Other Ambulatory Visit: Payer: Self-pay | Admitting: Family Medicine

## 2023-06-18 ENCOUNTER — Other Ambulatory Visit (INDEPENDENT_AMBULATORY_CARE_PROVIDER_SITE_OTHER)

## 2023-06-18 ENCOUNTER — Other Ambulatory Visit: Payer: Self-pay | Admitting: Emergency Medicine

## 2023-06-18 DIAGNOSIS — E785 Hyperlipidemia, unspecified: Secondary | ICD-10-CM

## 2023-06-18 NOTE — Addendum Note (Signed)
 Addended by: Mervin Kung A on: 06/18/2023 02:33 PM   Modules accepted: Orders

## 2023-06-18 NOTE — Telephone Encounter (Signed)
 Patient has been contacted. Started taking potassium last night and has a lab appointment scheduled for today at 2:30 pm

## 2023-06-19 LAB — COMPREHENSIVE METABOLIC PANEL
AG Ratio: 2.4 (calc) (ref 1.0–2.5)
ALT: 15 U/L (ref 6–29)
AST: 27 U/L (ref 10–35)
Albumin: 4.6 g/dL (ref 3.6–5.1)
Alkaline phosphatase (APISO): 46 U/L (ref 37–153)
BUN/Creatinine Ratio: 21 (calc) (ref 6–22)
BUN: 24 mg/dL (ref 7–25)
CO2: 36 mmol/L — ABNORMAL HIGH (ref 20–32)
Calcium: 10.3 mg/dL (ref 8.6–10.4)
Chloride: 93 mmol/L — ABNORMAL LOW (ref 98–110)
Creat: 1.13 mg/dL — ABNORMAL HIGH (ref 0.60–1.00)
Globulin: 1.9 g/dL (ref 1.9–3.7)
Glucose, Bld: 102 mg/dL — ABNORMAL HIGH (ref 65–99)
Potassium: 4.2 mmol/L (ref 3.5–5.3)
Sodium: 137 mmol/L (ref 135–146)
Total Bilirubin: 0.5 mg/dL (ref 0.2–1.2)
Total Protein: 6.5 g/dL (ref 6.1–8.1)

## 2023-06-21 ENCOUNTER — Encounter: Payer: Self-pay | Admitting: Family Medicine

## 2023-06-21 DIAGNOSIS — M25641 Stiffness of right hand, not elsewhere classified: Secondary | ICD-10-CM | POA: Diagnosis not present

## 2023-06-21 DIAGNOSIS — Z4789 Encounter for other orthopedic aftercare: Secondary | ICD-10-CM | POA: Diagnosis not present

## 2023-06-21 NOTE — Assessment & Plan Note (Signed)
 Encouraged moist heat and gentle stretching as tolerated. May try NSAIDs and prescription meds as directed and report if symptoms worsen or seek immediate care

## 2023-06-21 NOTE — Assessment & Plan Note (Signed)
 Encourage heart healthy diet such as MIND or DASH diet, increase exercise, avoid trans fats, simple carbohydrates and processed foods, consider a krill or fish or flaxseed oil cap daily.

## 2023-06-21 NOTE — Assessment & Plan Note (Addendum)
 Developed with medication changes. Encouraged increased hydration and fiber in diet. Daily probiotics. If bowels not moving can use MOM 2 tbls po in 4 oz of warm prune juice by mouth every 2-3 days. If no results then repeat in 4 hours with  Dulcolax suppository pr, may repeat again in 4 more hours as needed. Seek care if symptoms worsen. Consider daily Miralax and/or Dulcolax if symptoms persist.

## 2023-06-21 NOTE — Assessment & Plan Note (Signed)
 Patient struggling with significant stressors tried buspar 7.5.

## 2023-06-21 NOTE — Assessment & Plan Note (Signed)
 Encouraged good sleep hygiene such as dark, quiet room. No blue/green glowing lights such as computer screens in bedroom. No alcohol or stimulants in evening. Cut down on caffeine as able. Regular exercise is helpful but not just prior to bed time.

## 2023-06-24 ENCOUNTER — Ambulatory Visit (INDEPENDENT_AMBULATORY_CARE_PROVIDER_SITE_OTHER): Payer: Medicare Other | Admitting: Family Medicine

## 2023-06-24 ENCOUNTER — Encounter: Payer: Self-pay | Admitting: Family Medicine

## 2023-06-24 VITALS — BP 134/72 | HR 63 | Temp 97.5°F | Resp 16 | Ht 64.0 in | Wt 103.8 lb

## 2023-06-24 DIAGNOSIS — E785 Hyperlipidemia, unspecified: Secondary | ICD-10-CM

## 2023-06-24 DIAGNOSIS — M48061 Spinal stenosis, lumbar region without neurogenic claudication: Secondary | ICD-10-CM | POA: Diagnosis not present

## 2023-06-24 DIAGNOSIS — K5900 Constipation, unspecified: Secondary | ICD-10-CM | POA: Diagnosis not present

## 2023-06-24 DIAGNOSIS — E876 Hypokalemia: Secondary | ICD-10-CM

## 2023-06-24 DIAGNOSIS — F418 Other specified anxiety disorders: Secondary | ICD-10-CM

## 2023-06-24 DIAGNOSIS — G4700 Insomnia, unspecified: Secondary | ICD-10-CM

## 2023-06-24 MED ORDER — METOLAZONE 2.5 MG PO TABS: ORAL_TABLET | ORAL | 2 refills | Status: AC

## 2023-06-24 NOTE — Patient Instructions (Signed)
 Skratch electrolyte beverage powder at [employer]   Hypokalemia Hypokalemia means that the amount of potassium in the blood is lower than normal. Potassium is a mineral (electrolyte) that helps regulate the amount of fluid in the body. It also stimulates muscle tightening (contraction) and helps nerves work properly. Normally, most of the body's potassium is inside cells, and only a very small amount is in the blood. Because the amount in the blood is so small, minor changes to potassium levels in the blood can be life-threatening. What are the causes? This condition may be caused by: Antibiotic medicine. Diarrhea or vomiting. Taking too much of a medicine that helps you have a bowel movement (laxative) can cause diarrhea and lead to hypokalemia. Chronic kidney disease (CKD). Medicines that help the body get rid of excess fluid (diuretics). Eating disorders, such as anorexia or bulimia. Low magnesium levels in the body. Sweating a lot. What are the signs or symptoms? Symptoms of this condition include: Weakness. Constipation. Fatigue. Muscle cramps. Mental confusion. Skipped heartbeats or irregular heartbeat (palpitations). Tingling or numbness. How is this diagnosed? This condition is diagnosed with a blood test. How is this treated? This condition may be treated by: Taking potassium supplements. Adjusting the medicines that you take. Eating more foods that contain a lot of potassium. If your potassium level is very low, you may need to get potassium through an IV and be monitored in the hospital. Follow these instructions at home: Eating and drinking  Eat a healthy diet. A healthy diet includes fresh fruits and vegetables, whole grains, healthy fats, and lean proteins. If told, eat more foods that contain a lot of potassium. These include: Nuts, such as peanuts and pistachios. Seeds, such as sunflower seeds and pumpkin seeds. Peas, lentils, and lima beans. Whole grain  and bran cereals and breads. Fresh fruits and vegetables, such as apricots, avocado, bananas, cantaloupe, kiwi, oranges, tomatoes, asparagus, and potatoes. Juices, such as orange, tomato, and prune. Lean meats, including fish. Milk and milk products, such as yogurt. General instructions Take over-the-counter and prescription medicines only as told by your health care provider. This includes vitamins, natural food products, and supplements. Keep all follow-up visits. This is important. Contact a health care provider if: You have weakness that gets worse. You feel your heart pounding or racing. You vomit. You have diarrhea. You have diabetes and you have trouble keeping your blood sugar in your target range. Get help right away if: You have chest pain. You have shortness of breath. You have vomiting or diarrhea that lasts for more than 2 days. You faint. These symptoms may be an emergency. Get help right away. Call 911. Do not wait to see if the symptoms will go away. Do not drive yourself to the hospital. Summary Hypokalemia means that the amount of potassium in the blood is lower than normal. This condition is diagnosed with a blood test. Hypokalemia may be treated by taking potassium supplements, adjusting the medicines that you take, or eating more foods that are high in potassium. If your potassium level is very low, you may need to get potassium through an IV and be monitored in the hospital. This information is not intended to replace advice given to you by your health care provider. Make sure you discuss any questions you have with your health care provider. Document Revised: 12/12/2020 Document Reviewed: 12/12/2020 Elsevier Patient Education  2024 [employer].

## 2023-06-25 ENCOUNTER — Encounter: Payer: Self-pay | Admitting: Family Medicine

## 2023-06-25 LAB — COMPREHENSIVE METABOLIC PANEL
ALT: 14 U/L (ref 0–35)
AST: 27 U/L (ref 0–37)
Albumin: 4.5 g/dL (ref 3.5–5.2)
Alkaline Phosphatase: 42 U/L (ref 39–117)
BUN: 17 mg/dL (ref 6–23)
CO2: 31 meq/L (ref 19–32)
Calcium: 10 mg/dL (ref 8.4–10.5)
Chloride: 95 meq/L — ABNORMAL LOW (ref 96–112)
Creatinine, Ser: 1.02 mg/dL (ref 0.40–1.20)
GFR: 52.45 mL/min — ABNORMAL LOW (ref >60.00)
Glucose, Bld: 79 mg/dL (ref 70–99)
Potassium: 4 meq/L (ref 3.5–5.1)
Sodium: 138 meq/L (ref 135–145)
Total Bilirubin: 0.5 mg/dL (ref 0.2–1.2)
Total Protein: 6.6 g/dL (ref 6.0–8.3)

## 2023-06-27 NOTE — Progress Notes (Signed)
 Subjective:    [name], female    DOB: [birth year], 78 y.o.   MRN: [id number]  Chief Complaint  Patient presents with  . Follow-up    HPI Discussed the use of AI scribe software for clinical note transcription with the patient, who gave verbal consent to proceed.  History of Present Illness The patient is a 78 year old who presents for follow-up after recent finger joint replacement surgery and management of edema. She was referred by Doctor Amanda Pea for follow-up after surgery.  She underwent recent finger joint replacement surgery involving the replacement of two joints in her dominant hand and structural adjustments to improve strength. Post-surgery, she experiences a significant reduction in pain, attributed to numbness in the area. She attends therapy sessions once a week at [facility] and feels the surgery was worthwhile so far.  She experiences edema and fluid retention, with the right side appearing worse than the left. The sensation is described as feeling like walking on marshmallows, affecting her balance. Her weight increased to 104 pounds over the past week despite careful dietary management, including avoiding salt and junk food and ensuring adequate protein intake. She has a history of varicose veins and has had multiple surgeries for them. No chest pain or breathing difficulties.  She was prescribed metolazone to be taken before furosemide, which was effective in reducing the edema and returning her weight to her normal range of 98-99 pounds. However, she experienced a drop in potassium levels while on metolazone, which improved after discontinuing the medication and taking potassium supplements for a few days. She finds zip-up compression socks difficult to put on due to weakness in her hands.    Past Medical History:  Diagnosis Date  . Abdominal pain 02/01/2015  . Allergy    "seasonal allergies"  . Anemia   . Arthritis    osteoarthritis-"DDD"  hips. fingers, toes.  . Blood transfusion without reported diagnosis   . Complication of anesthesia   . Depression   . Depression with anxiety   . Frequent headaches    no problems now.  Marland Kitchen Hearing loss in right ear 03/10/2015   same over last 6 months  . Hip dislocation, right (HCC)   . History of chicken pox   . Hyperlipidemia    pt. state no hx  . Hypokalemia 02/04/2017  . Hypothyroidism    supplement used  . Osteoporosis   . Peripheral neuropathy 02/01/2015   right hand"carpal tunnel"  . PONV (postoperative nausea and vomiting)   . Preventative health care 02/04/2017  . Right hip pain 05/24/2015  . Scoliosis 02/01/2015  . Spinal stenosis of lumbar region 10/23/2016  . Thyroid disease   . URI, acute 03/10/2015  . UTI (urinary tract infection) 12/14/2016  . 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 ACETABULAR REVISION Right 10/30/2015   Procedure: RIGHT HIP ACETABULAR REVISION /CONSTRAINED LINER (POSTERIOR APPROACH) ;  Surgeon: Ollen Gross, MD;  Location: [facility];  Service: Orthopedics;  Laterality: Right;  . BREAST EXCISIONAL BIOPSY Right   . BREAST SURGERY     lump removed, and breast reduction  . EYE SURGERY     lasik   2002  . HIP CLOSED REDUCTION Right 06/30/2015   Procedure: CLOSED MANIPULATION HIP;  Surgeon: Venita Lick, MD;  Location: [facility];  Service: Orthopedics;  Laterality: Right;  . JOINT REPLACEMENT     3 hips  on right and 1 on left  . METACARPOPHALANGEAL JOINT CAPSULECTOMY / CAPSULOTOMY  1995  . REDUCTION MAMMAPLASTY    . SPINAL CORD STIMULATOR INSERTION N/A 12/02/2016   Procedure: LUMBAR SPINAL CORD STIMULATOR INSERTION;  Surgeon: Venita Lick, MD;  Location: [facility];  Service: Orthopedics;  Laterality: N/A;  2.5 hrs  . SPINAL CORD STIMULATOR REMOVAL N/A 12/31/2016   Procedure: LUMBAR SPINAL CORD STIMULATOR REMOVAL;  Surgeon: Venita Lick, MD;  Location: [facility];  Service: Orthopedics;   Laterality: N/A;  60 mins  . TONSILLECTOMY    . TOTAL HIP ARTHROPLASTY  2011   left hip  . TOTAL HIP ARTHROPLASTY Right 06/12/2015   Procedure: RIGHT TOTAL HIP ARTHROPLASTY ANTERIOR APPROACH;  Surgeon: Ollen Gross, MD;  Location: [facility];  Service: Orthopedics;  Laterality: Right;  . TOTAL HIP REVISION Right 07/19/2015   Procedure: RIGHT HIP BEARING SURFACE REVISION;  Surgeon: Ollen Gross, MD;  Location: [facility];  Service: Orthopedics;  Laterality: Right;  . TUBAL LIGATION  1980   left fallopian tube removal    Family History  Problem Relation Age of Onset  . Arthritis Mother   . Dementia Mother   . Mental illness Father        suicide  . Arthritis Maternal Grandmother   . Kidney disease Son        b/l multicystic kidney disease s/p 4 transplants  . Colon cancer Neg Hx     Social History   Socioeconomic History  . Marital status: Married    Spouse name: Not on file  . Number of children: Not on file  . Years of education: 68  . Highest education level: Bachelor's degree (e.g., BA, AB, BS)  Occupational History  . Occupation: retired  Tobacco Use  . Smoking status: Never  . Smokeless tobacco: Never  Vaping Use  . Vaping status: Never Used  Substance and Sexual Activity  . Alcohol use: Yes    Alcohol/week: 1.0 standard drink of alcohol    Types: 1 Glasses of wine per week  . Drug use: No  . 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 Not on file  Social History Narrative  . Not on file   Social Drivers of Health   Financial Resource Strain: Low Risk  (06/17/2023)   Overall Financial Resource Strain (CARDIA)   . Difficulty of Paying Living Expenses: Not very hard  Food Insecurity: No Food Insecurity (06/17/2023)   Hunger Vital Sign   . Worried About Programme researcher, broadcasting/film/video in the Last Year: Never true   . Ran Out of Food in the Last Year: Never true  Transportation Needs: No  Transportation Needs (06/17/2023)   PRAPARE - Transportation   . Lack of Transportation (Medical): No   . Lack of Transportation (Non-Medical): No  Physical Activity: Insufficiently Active (06/17/2023)   Exercise Vital Sign   . Days of Exercise per Week: 2 days   . Minutes of Exercise per Session: 20 min  Stress: Stress Concern Present (06/17/2023)   [employer] of Occupational Health - Occupational Stress Questionnaire   . Feeling of Stress : To some extent  Social Connections: Socially Isolated (06/17/2023)   Social Connection and Isolation Panel [NHANES]   . Frequency of Communication with Friends and Family: Once a week   . Frequency of Social Gatherings with Friends and Family: Never   . Attends Religious Services: Never   .  Active Member of Clubs or Organizations: No   . Attends Banker Meetings: Not on file   . Marital Status: Married  Catering manager Violence: Not At Risk (12/24/2020)   Humiliation, Afraid, Rape, and Kick questionnaire   . Fear of Current or Ex-Partner: No   . Emotionally Abused: No   . Physically Abused: No   . Sexually Abused: No    Outpatient Medications Prior to Visit  Medication Sig Dispense Refill  . cholecalciferol (VITAMIN D) 1000 units tablet Take 1,000 Units by mouth daily.    . fluticasone (FLONASE) 50 MCG/ACT nasal spray Place 2 sprays into both nostrils daily.    . furosemide (LASIX) 20 MG tablet TAKE 1 TABLET(20 MG) BY MOUTH DAILY 90 tablet 2  . gabapentin (NEURONTIN) 300 MG capsule TAKE 1 CAPSULE(300 MG) BY MOUTH AT BEDTIME 90 capsule 2  . HYDROcodone-acetaminophen (NORCO) 10-325 MG tablet Take 1 tablet by mouth every 6 (six) hours as needed.    Marland Kitchen Ketotifen Fumarate (ZADITOR OP) Place 1 drop into both eyes 2 (two) times daily as needed (itchy eyes).    Marland Kitchen levothyroxine (SYNTHROID) 25 MCG tablet TAKE 1 TABLET(25 MCG) BY MOUTH DAILY BEFORE BREAKFAST 90 tablet 3  . liothyronine (CYTOMEL) 25 MCG tablet Take 0.5 tablets (12.5 mcg total)  by mouth daily. 45 tablet 0  . LORazepam (ATIVAN) 0.5 MG tablet Take 1-2 tablets (0.5-1 mg total) by mouth 2 (two) times daily as needed for anxiety. 60 tablet 1  . potassium chloride SA (KLOR-CON M) 20 MEQ tablet Take 1 tablet (20 mEq total) by mouth 2 (two) times daily. 60 tablet 3  . metolazone (ZAROXOLYN) 2.5 MG tablet 1 take 1 tab daily as needed for edema 30 minutes prior to Furosemide dose 10 tablet 0   No facility-administered medications prior to visit.    Allergies  Allergen Reactions  . Penicillins Hives    Childhood allergy  Has patient had a PCN reaction causing immediate rash, facial/tongue/throat swelling, SOB or lightheadedness with hypotension: No Has patient had a PCN reaction causing severe rash involving mucus membranes or skin necrosis: No Has patient had a PCN reaction that required hospitalization No Has patient had a PCN reaction occurring within the last 10 years: No If all of the above answers are "NO", then may proceed with Cephalosporin use.  . Sulfa Antibiotics Hives    Review of Systems  Constitutional:  Positive for malaise/fatigue. Negative for fever.  HENT:  Negative for congestion.   Eyes:  Negative for blurred vision.  Respiratory:  Negative for shortness of breath.   Cardiovascular:  Positive for leg swelling. Negative for chest pain and palpitations.  Gastrointestinal:  Negative for abdominal pain, blood in stool and nausea.  Genitourinary:  Negative for dysuria and frequency.  Musculoskeletal:  Positive for joint pain. Negative for falls.  Skin:  Negative for rash.  Neurological:  Negative for dizziness, loss of consciousness and headaches.  Endo/Heme/Allergies:  Negative for environmental allergies.  Psychiatric/Behavioral:  Negative for depression. The patient is not nervous/anxious.       Objective:    Physical Exam Constitutional:      General: She is not in acute distress.    Appearance: Normal appearance. She is well-developed. She  is ill-appearing. She is not toxic-appearing.  HENT:     Head: Normocephalic and atraumatic.     Right Ear: External ear normal.     Left Ear: External ear normal.     Nose: Nose normal.  Eyes:  General:        Right eye: No discharge.        Left eye: No discharge.     Conjunctiva/sclera: Conjunctivae normal.  Neck:     Thyroid: No thyromegaly.  Cardiovascular:     Rate and Rhythm: Normal rate and regular rhythm.     Heart sounds: Normal heart sounds. No murmur heard. Pulmonary:     Effort: Pulmonary effort is normal. No respiratory distress.     Breath sounds: Normal breath sounds.  Abdominal:     General: Bowel sounds are normal.     Palpations: Abdomen is soft.     Tenderness: There is no abdominal tenderness. There is no guarding.  Musculoskeletal:        General: Normal range of motion.     Cervical back: Neck supple.     Right lower leg: Edema present.     Left lower leg: Edema present.  Lymphadenopathy:     Cervical: No cervical adenopathy.  Skin:    General: Skin is warm and dry.  Neurological:     Mental Status: She is alert and oriented to person, place, and time.  Psychiatric:        Mood and Affect: Mood normal.        Behavior: Behavior normal.        Thought Content: Thought content normal.        Judgment: Judgment normal.   BP 134/72 (BP Location: Left Arm, Patient Position: Sitting, Cuff Size: Normal)   Pulse 63   Temp (!) 97.5 F (36.4 C) (Oral)   Resp 16   Ht 5\' 4"  (1.626 m)   Wt 103 lb 12.8 oz (47.1 kg)   SpO2 100%   BMI 17.82 kg/m  Wt Readings from Last 3 Encounters:  06/24/23 103 lb 12.8 oz (47.1 kg)  03/09/23 102 lb 6.4 oz (46.4 kg)  12/08/22 99 lb 6.4 oz (45.1 kg)    Diabetic Foot Exam - Simple   No data filed    Lab Results  Component Value Date   WBC 6.4 03/09/2023   HGB 13.4 03/09/2023   HCT 40.4 03/09/2023   PLT 253.0 03/09/2023   GLUCOSE 79 06/24/2023   CHOL 298 (H) 03/09/2023   TRIG 103.0 03/09/2023   HDL 84.80  03/09/2023   LDLCALC 192 (H) 03/09/2023   ALT 14 06/24/2023   AST 27 06/24/2023   NA 138 06/24/2023   K 4.0 06/24/2023   CL 95 (L) 06/24/2023   CREATININE 1.02 06/24/2023   BUN 17 06/24/2023   CO2 31 06/24/2023   TSH 0.44 06/10/2023   INR 0.95 10/29/2015    Lab Results  Component Value Date   TSH 0.44 06/10/2023   Lab Results  Component Value Date   WBC 6.4 03/09/2023   HGB 13.4 03/09/2023   HCT 40.4 03/09/2023   MCV 85.2 03/09/2023   PLT 253.0 03/09/2023   Lab Results  Component Value Date   NA 138 06/24/2023   K 4.0 06/24/2023   CO2 31 06/24/2023   GLUCOSE 79 06/24/2023   BUN 17 06/24/2023   CREATININE 1.02 06/24/2023   BILITOT 0.5 06/24/2023   ALKPHOS 42 06/24/2023   AST 27 06/24/2023   ALT 14 06/24/2023   PROT 6.6 06/24/2023   ALBUMIN 4.5 06/24/2023   CALCIUM 10.0 06/24/2023   ANIONGAP 9 09/19/2021   GFR 52.45 (L) 06/24/2023   Lab Results  Component Value Date   CHOL 298 (H) 03/09/2023  Lab Results  Component Value Date   HDL 84.80 03/09/2023   Lab Results  Component Value Date   LDLCALC 192 (H) 03/09/2023   Lab Results  Component Value Date   TRIG 103.0 03/09/2023   Lab Results  Component Value Date   CHOLHDL 4 03/09/2023   No results found for: "HGBA1C"     Assessment & Plan:  Hyperlipidemia, unspecified hyperlipidemia type Assessment & Plan: Encourage heart healthy diet such as MIND or DASH diet, increase exercise, avoid trans fats, simple carbohydrates and processed foods, consider a krill or fish or flaxseed oil cap daily.     Spinal stenosis of lumbar region, unspecified whether neurogenic claudication present Assessment & Plan: Encouraged moist heat and gentle stretching as tolerated. May try NSAIDs and prescription meds as directed and report if symptoms worsen or seek immediate care    Depression with anxiety Assessment & Plan: Patient struggling with significant stressors tried buspar 7.5.    Constipation, unspecified  constipation type Assessment & Plan: Developed with medication changes. Encouraged increased hydration and fiber in diet. Daily probiotics. If bowels not moving can use MOM 2 tbls po in 4 oz of warm prune juice by mouth every 2-3 days. If no results then repeat in 4 hours with  Dulcolax suppository pr, may repeat again in 4 more hours as needed. Seek care if symptoms worsen. Consider daily Miralax and/or Dulcolax if symptoms persist.   Insomnia, unspecified type Assessment & Plan: Encouraged good sleep hygiene such as dark, quiet room. No blue/green glowing lights such as computer screens in bedroom. No alcohol or stimulants in evening. Cut down on caffeine as able. Regular exercise is helpful but not just prior to bed time.    Hypokalemia -     Comprehensive metabolic panel -     Comprehensive metabolic panel; Future  Other orders -     metOLazone; 1 take 1 tab daily as needed for edema 30 minutes prior to Furosemide dose  Dispense: 30 tablet; Refill: 2    Assessment and Plan Assessment & Plan Post-operative recovery from finger joint replacement surgery Recovering with improved pain levels but persistent hand numbness. Attending weekly therapy to regain strength in dominant hand affected by arthritis. Optimistic about further recovery. - Continue weekly therapy sessions at [facility]. - Monitor for return of sensation and improvement in hand strength. - Follow up with Dr. Amanda Pea as scheduled.  Edema and fluid retention with hypokalemia Significant fluid retention, especially in right leg, affecting balance. Metolazone with furosemide effective but caused hypokalemia. Discussed dietary modifications and medication regimen to prevent hypokalemia. - Order comprehensive metabolic panel today to establish a baseline. - Prescribe metolazone, take ahead of furosemide for three days, then every other day. - Prescribe potassium tablets, take two on metolazone days and one on non-metolazone  days. - Encourage intake of high potassium foods and electrolyte beverages like Skratch. - Advise on maintaining adequate protein intake to manage edema. - Recommend elevating feet above heart level to reduce fluid retention. - Consider using zip-up compression socks if possible. - Order metabolic panel for today and another for next Thursday to monitor potassium levels. - Monitor for symptoms of hypokalemia, such as palpitations and muscle cramps, and report any occurrences.  General Health Maintenance Advised to maintain a healthy diet focusing on high protein intake and low salt consumption to manage fluid retention and overall health. - Encourage a diet high in protein and low in salt. - Continue routine health screenings and immunizations as per  guidelines.     Danise Edge, MD

## 2023-06-28 DIAGNOSIS — M25631 Stiffness of right wrist, not elsewhere classified: Secondary | ICD-10-CM | POA: Diagnosis not present

## 2023-07-01 ENCOUNTER — Other Ambulatory Visit (INDEPENDENT_AMBULATORY_CARE_PROVIDER_SITE_OTHER)

## 2023-07-01 DIAGNOSIS — E876 Hypokalemia: Secondary | ICD-10-CM

## 2023-07-02 LAB — COMPREHENSIVE METABOLIC PANEL
ALT: 14 U/L (ref 0–35)
AST: 25 U/L (ref 0–37)
Albumin: 4.7 g/dL (ref 3.5–5.2)
Alkaline Phosphatase: 47 U/L (ref 39–117)
BUN: 25 mg/dL — ABNORMAL HIGH (ref 6–23)
CO2: 34 meq/L — ABNORMAL HIGH (ref 19–32)
Calcium: 10.2 mg/dL (ref 8.4–10.5)
Chloride: 94 meq/L — ABNORMAL LOW (ref 96–112)
Creatinine, Ser: 1.2 mg/dL (ref 0.40–1.20)
GFR: 43.15 mL/min — ABNORMAL LOW (ref >60.00)
Glucose, Bld: 84 mg/dL (ref 70–99)
Potassium: 3.9 meq/L (ref 3.5–5.1)
Sodium: 138 meq/L (ref 135–145)
Total Bilirubin: 0.5 mg/dL (ref 0.2–1.2)
Total Protein: 6.9 g/dL (ref 6.0–8.3)

## 2023-07-12 DIAGNOSIS — M25641 Stiffness of right hand, not elsewhere classified: Secondary | ICD-10-CM | POA: Diagnosis not present

## 2023-07-20 DIAGNOSIS — M25641 Stiffness of right hand, not elsewhere classified: Secondary | ICD-10-CM | POA: Diagnosis not present

## 2023-07-22 ENCOUNTER — Other Ambulatory Visit

## 2023-08-04 DIAGNOSIS — M25641 Stiffness of right hand, not elsewhere classified: Secondary | ICD-10-CM | POA: Diagnosis not present

## 2023-08-23 DIAGNOSIS — Z4789 Encounter for other orthopedic aftercare: Secondary | ICD-10-CM | POA: Diagnosis not present

## 2023-08-24 DIAGNOSIS — M5416 Radiculopathy, lumbar region: Secondary | ICD-10-CM | POA: Diagnosis not present

## 2023-08-24 DIAGNOSIS — G894 Chronic pain syndrome: Secondary | ICD-10-CM | POA: Diagnosis not present

## 2023-09-12 ENCOUNTER — Other Ambulatory Visit: Payer: Self-pay | Admitting: Family Medicine

## 2023-09-14 ENCOUNTER — Ambulatory Visit (INDEPENDENT_AMBULATORY_CARE_PROVIDER_SITE_OTHER): Admitting: Family Medicine

## 2023-09-14 ENCOUNTER — Encounter: Payer: Self-pay | Admitting: Family Medicine

## 2023-09-14 VITALS — BP 132/80 | HR 68 | Temp 97.7°F | Resp 16 | Ht 64.0 in | Wt 103.2 lb

## 2023-09-14 DIAGNOSIS — L509 Urticaria, unspecified: Secondary | ICD-10-CM

## 2023-09-14 MED ORDER — METHYLPREDNISOLONE ACETATE 80 MG/ML IJ SUSP
80.0000 mg | Freq: Once | INTRAMUSCULAR | Status: AC
  Administered 2023-09-14: 80 mg via INTRAMUSCULAR

## 2023-09-14 MED ORDER — PREDNISONE 10 MG PO TABS: ORAL_TABLET | ORAL | 0 refills | Status: DC

## 2023-09-14 NOTE — Progress Notes (Signed)
 Established Patient Office Visit  Subjective   [name], female    DOB: 1946-03-31  Age: 78 y.o. MRN: [id number]  Chief Complaint  Patient presents with   Rash    Pt believes she is having hives that started on Sunday morning. Pt reports itching and discomfort. Pt has been using Benadryl     HPI Discussed the use of AI scribe software for clinical note transcription with the patient, who gave verbal consent to proceed.  History of Present Illness Misty L Vogt-Nichols "Debria Fang" is a 78 year old female who presents with a widespread itchy rash following a suspected insect bite.  Symptoms began on Sunday morning with a bite on her finger, initially thought to be a bug bite. The rash has since spread to various parts of her body, including under her arms, and is described as intensely pruritic. Some lesions have a blister-like appearance, but most do not.  She has been taking Benadryl , two tablets every four hours, to manage the itching, which was particularly severe last night. No recent exposure to new detergents, lotions, or ticks, and she has not been working in the garden recently.  She has a history of chicken pox and measles. She is concerned about the potential for contagion due to her immunosuppressed son working around the house. No fever or other systemic symptoms, stating that she feels fine aside from the itching.   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   Back pain 11/26/2017   Chronic  night sweats 08/18/2017   Palpitations 08/18/2017   Hypokalemia 02/04/2017   Preventative health care 02/04/2017   Status post insertion of spinal cord stimulator 12/02/2016   Abnormal EKG 10/23/2016   Spinal stenosis of lumbar region 10/23/2016   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 fingers, toes.   Blood transfusion without reported diagnosis    Complication of anesthesia    Depression    Depression with anxiety    Frequent headaches    no problems now.   Hearing loss in right ear 03/10/2015   same over last 6 months   Hip dislocation, right (HCC)    History of chicken pox    Hyperlipidemia    pt. state no hx   Hypokalemia 02/04/2017   Hypothyroidism    supplement used   Osteoporosis    Peripheral neuropathy 02/01/2015   right hand"carpal tunnel"   PONV (postoperative nausea and vomiting)    Preventative health care 02/04/2017   Right hip pain 05/24/2015   Scoliosis 02/01/2015   Spinal stenosis  of lumbar region 10/23/2016   Thyroid  disease    URI, acute 03/10/2015   UTI (urinary tract infection) 12/14/2016   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   Past Surgical History:  Procedure Laterality Date   ACETABULAR REVISION Right 10/30/2015   Procedure: RIGHT HIP ACETABULAR REVISION /CONSTRAINED LINER (POSTERIOR APPROACH) ;  Surgeon: Liliane Rei, MD;  Location: [facility];  Service: Orthopedics;  Laterality: Right;   BREAST EXCISIONAL BIOPSY Right    BREAST SURGERY     lump removed, and breast reduction   EYE SURGERY     lasik   2002   HIP  CLOSED REDUCTION Right 06/30/2015   Procedure: CLOSED MANIPULATION HIP;  Surgeon: Mort Ards, MD;  Location: [facility];  Service: Orthopedics;  Laterality: Right;   JOINT REPLACEMENT     3 hips on right and 1 on left   METACARPOPHALANGEAL JOINT CAPSULECTOMY / CAPSULOTOMY  1995   REDUCTION MAMMAPLASTY     SPINAL CORD STIMULATOR INSERTION N/A 12/02/2016   Procedure: LUMBAR SPINAL CORD STIMULATOR INSERTION;  Surgeon: Mort Ards, MD;  Location: [facility];  Service: Orthopedics;  Laterality: N/A;  2.5 hrs   SPINAL CORD STIMULATOR REMOVAL N/A 12/31/2016   Procedure: LUMBAR SPINAL CORD STIMULATOR REMOVAL;  Surgeon: Mort Ards, MD;  Location: [facility];  Service: Orthopedics;  Laterality: N/A;  60 mins   TONSILLECTOMY     TOTAL HIP ARTHROPLASTY  2011   left hip   TOTAL HIP ARTHROPLASTY Right 06/12/2015   Procedure: RIGHT TOTAL HIP ARTHROPLASTY ANTERIOR APPROACH;  Surgeon: Liliane Rei, MD;  Location: [facility];  Service: Orthopedics;  Laterality: Right;   TOTAL HIP REVISION Right 07/19/2015   Procedure: RIGHT HIP BEARING SURFACE REVISION;  Surgeon: Liliane Rei, MD;  Location: [facility];  Service: Orthopedics;  Laterality: Right;   TUBAL LIGATION  1980   left fallopian tube removal   Social History   Tobacco Use   Smoking status: Never   Smokeless tobacco: Never  Vaping Use   Vaping status: Never Used  Substance Use Topics   Alcohol use: Yes    Alcohol/week: 1.0 standard drink of alcohol    Types: 1 Glasses of wine per week   Drug use: No   Social History   Socioeconomic History   Marital status: Married    Spouse name: Not on file   Number of children: Not on file   Years of education: 16   Highest education level: Bachelor's degree (e.g., BA, AB, BS)  Occupational History   Occupation: retired  Tobacco Use   Smoking status: Never   Smokeless tobacco: Never  Vaping Use   Vaping status: Never Used  Substance and Sexual Activity   Alcohol use: Yes    Alcohol/week: 1.0 standard drink of  alcohol    Types: 1 Glasses of wine per week   Drug use: No   Sexual activity: Not on file    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, retired Audiological scientist, 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  Other Topics Concern   Not on file  Social History Narrative   Not on file   Social Drivers of Health   Financial Resource Strain: Low Risk  (06/17/2023)   Overall Financial Resource Strain (CARDIA)    Difficulty of Paying Living Expenses: Not very hard  Food Insecurity: No Food Insecurity (06/17/2023)   Hunger Vital Sign    Worried About Running Out of Food in the Last Year: Never true    Ran Out of Food  in the Last Year: Never true  Transportation Needs: No Transportation Needs (06/17/2023)   PRAPARE - Administrator, Civil Service (Medical): No    Lack of Transportation (Non-Medical): No  Physical Activity: Insufficiently Active (06/17/2023)   Exercise Vital Sign    Days of Exercise per Week: 2 days    Minutes of Exercise per Session: 20 min  Stress: Stress Concern Present (06/17/2023)   [employer] of Occupational Health - Occupational Stress Questionnaire    Feeling of Stress : To some extent  Social Connections: Socially Isolated (06/17/2023)   Social Connection and Isolation Panel [NHANES]    Frequency of Communication with Friends and Family: Once a week    Frequency of Social Gatherings with Friends and Family: Never    Attends Religious Services: Never    Database administrator or Organizations: No    Attends Engineer, structural: Not on file    Marital Status: Married  Catering manager Violence: Not At Risk (12/24/2020)   Humiliation, Afraid, Rape, and Kick questionnaire    Fear of Current or Ex-Partner: No    Emotionally Abused: No    Physically Abused: No    Sexually Abused: No   Family Status  Relation Name Status   Mother 4 Deceased   Father 48ish Deceased   Brother 32 Alive   Son Douglas,44 Alive   MGM  Deceased   MGF  Deceased   PGM  Deceased   PGF  Deceased    Son Michael,40 Alive   Neg Hx  (Not Specified)  No partnership data on file   Family History  Problem Relation Age of Onset   Arthritis Mother    Dementia Mother    Mental illness Father        suicide   Arthritis Maternal Grandmother    Kidney disease Son        b/l multicystic kidney disease s/p 4 transplants   Colon cancer Neg Hx    Allergies  Allergen Reactions   Penicillins Hives    Childhood allergy  Has patient had a PCN reaction causing immediate rash, facial/tongue/throat swelling, SOB or lightheadedness with hypotension: No Has patient had a PCN reaction causing severe rash involving mucus membranes or skin necrosis: No Has patient had a PCN reaction that required hospitalization No Has patient had a PCN reaction occurring within the last 10 years: No If all of the above answers are "NO", then may proceed with Cephalosporin use.   Sulfa Antibiotics Hives      Review of Systems  Constitutional:  Negative for fever and malaise/fatigue.  HENT:  Negative for congestion.   Eyes:  Negative for blurred vision.  Respiratory:  Negative for cough and shortness of breath.   Cardiovascular:  Negative for chest pain, palpitations and leg swelling.  Gastrointestinal:  Negative for vomiting.  Musculoskeletal:  Negative for back pain.  Skin:  Positive for itching and rash.  Neurological:  Negative for loss of consciousness and headaches.      Objective:     BP 132/80 (BP Location: Left Arm, Patient Position: Sitting, Cuff Size: Normal)   Pulse 68   Temp 97.7 F (36.5 C) (Oral)   Resp 16   Ht 5\' 4"  (1.626 m)   Wt 103 lb 3.2 oz (46.8 kg)   BMI 17.71 kg/m  BP Readings from Last 3 Encounters:  09/14/23 132/80  06/24/23 134/72  03/09/23 136/76   Wt Readings from Last 3 Encounters:  09/14/23 103  lb 3.2 oz (46.8 kg)  06/24/23 103 lb 12.8 oz (47.1 kg)  03/09/23 102 lb 6.4 oz (46.4 kg)   SpO2 Readings from Last 3 Encounters:  06/24/23 100%  03/09/23 100%   12/08/22 97%      Physical Exam Vitals and nursing note reviewed.  Constitutional:      General: She is not in acute distress.    Appearance: Normal appearance. She is well-developed.  HENT:     Head: Normocephalic and atraumatic.  Eyes:     General: No scleral icterus.       Right eye: No discharge.        Left eye: No discharge.  Cardiovascular:     Rate and Rhythm: Normal rate and regular rhythm.     Heart sounds: No murmur heard. Pulmonary:     Effort: Pulmonary effort is normal. No respiratory distress.     Breath sounds: Normal breath sounds.  Musculoskeletal:        General: Normal range of motion.     Cervical back: Normal range of motion and neck supple.     Right lower leg: No edema.     Left lower leg: No edema.  Skin:    General: Skin is warm and dry.     Findings: Rash present. Rash is urticarial.     Comments: Hives on both arms L side hip and abd  Also on neck   Neurological:     Mental Status: She is alert and oriented to person, place, and time.  Psychiatric:        Mood and Affect: Mood normal.        Behavior: Behavior normal.        Thought Content: Thought content normal.        Judgment: Judgment normal.      No results found for any visits on 09/14/23.  Last CBC Lab Results  Component Value Date   WBC 6.4 03/09/2023   HGB 13.4 03/09/2023   HCT 40.4 03/09/2023   MCV 85.2 03/09/2023   MCH 28.1 09/19/2021   RDW 15.4 03/09/2023   PLT 253.0 03/09/2023   Last metabolic panel Lab Results  Component Value Date   GLUCOSE 84 07/01/2023   NA 138 07/01/2023   K 3.9 07/01/2023   CL 94 (L) 07/01/2023   CO2 34 (H) 07/01/2023   BUN 25 (H) 07/01/2023   CREATININE 1.20 07/01/2023   GFR 43.15 (L) 07/01/2023   CALCIUM 10.2 07/01/2023   PROT 6.9 07/01/2023   ALBUMIN  4.7 07/01/2023   BILITOT 0.5 07/01/2023   ALKPHOS 47 07/01/2023   AST 25 07/01/2023   ALT 14 07/01/2023   ANIONGAP 9 09/19/2021   Last lipids Lab Results  Component Value  Date   CHOL 298 (H) 03/09/2023   HDL 84.80 03/09/2023   LDLCALC 192 (H) 03/09/2023   TRIG 103.0 03/09/2023   CHOLHDL 4 03/09/2023   Last hemoglobin A1c No results found for: "HGBA1C" Last thyroid  functions Lab Results  Component Value Date   TSH 0.44 06/10/2023   Last vitamin D  Lab Results  Component Value Date   VD25OH 60.10 09/03/2022   Last vitamin B12 and Folate No results found for: "VITAMINB12", "FOLATE"    The 10-year ASCVD risk score (Arnett DK, et al., 2019) is: 26.5%    Assessment & Plan:   Problem List Items Addressed This Visit   None Visit Diagnoses       Urticaria    -  Primary  Relevant Medications   predniSONE  (DELTASONE ) 10 MG tablet     Assessment and Plan Assessment & Plan Urticaria   Acute urticaria began Sunday morning, initially thought to be insect bites, now widespread including underarms. Severe itching is present without fever or systemic symptoms. No new detergents, lotions, or tick exposure. Bilateral lesions are inconsistent with shingles due to lack of pain. Likely allergic or idiopathic in origin. Informed consent obtained for steroid injection and prednisone  treatment. Administer steroid injection to rapidly reduce inflammation and itching. Prescribe prednisone  with a tapering schedule starting the following day for complete resolution. Recommend Benadryl  for symptomatic relief, especially at night. Instruct to contact the clinic if urticaria recurs after treatment for further evaluation.    Return if symptoms worsen or fail to improve.    Debra Colon R Lowne Chase, DO

## 2023-09-14 NOTE — Patient Instructions (Signed)
 Hives Hives (urticaria) are itchy, red, swollen areas of skin. They can show up on any part of the body. They often fade within 24 hours (acute hives). If you get new hives after the old ones fade and the cycle goes on for many days or weeks, it is called chronic hives. Hives do not spread from person to person (are not contagious). Hives can happen when your body reacts to something you are allergic to (allergen) or to something that irritates your skin. When you are exposed to something that triggers hives, your body releases a chemical called histamine. This causes redness, itching, and swelling. Hives can show up right after you are exposed to a trigger or hours later. What are the causes? Hives may be caused by: Food allergies. Insect bites or stings. Allergies to pollen or pets. Spending time in sunlight, heat, or cold (exposure). Exercise. Stress. You can also get hives from other conditions and treatments. These include: Viruses, such as the common cold. Bacterial infections, such as urinary tract infections and strep throat. Certain medicines. Contact with latex or chemicals. Allergy shots. Blood transfusions. In some cases, the cause of hives is not known (idiopathic hives). What increases the risk? You are more likely to get hives if: You are female. You have food allergies. Hives are more common if you are allergic to citrus fruits, milk, eggs, peanuts, tree nuts, or shellfish. You are allergic to: Medicines. Latex. Insects. Animals. Pollen. What are the signs or symptoms? Common symptoms of hives include raised, itchy, red or white bumps or patches on your skin. These areas may: Become large and swollen (welts). Quickly change shape and location. This may happen more than once. Be separate hives or connect over a large area of skin. Sting or become painful. Turn white when pressed in the center (blanch). In severe cases, your hands, feet, and face may also become  swollen. This may happen if hives form deeper in your skin. How is this diagnosed? Hives may be diagnosed based on your symptoms, medical history, and a physical exam. You may have skin, pee (urine), or blood tests done. These can help find out what is causing your hives and rule out other health issues. You may also have a biopsy done. This is when a small piece of skin is removed for testing. How is this treated? Treatment for hives depends on the cause and on how severe your symptoms are. You may be told to use cool, wet cloths (cool compresses) or to take cool showers to relieve itching. Treatment may also include: Medicines to help: Relieve itching (antihistamines). Reduce swelling (corticosteroids). Treat infection (antibiotics). An injectable medicine called omalizumab. You may need this if you have chronic idiopathic hives and still have symptoms even after you are treated with antihistamines. In severe cases, you may need to use a device filled with medicine that gives an emergency shot of epinephrine (auto-injector pen) to prevent a very bad allergic reaction (anaphylactic reaction). Follow these instructions at home: Medicines Take and apply over-the-counter and prescription medicines only as told by your health care provider. If you were prescribed antibiotics, take them as told by your provider. Do not stop using the antibiotic even if you start to feel better. Skin care Apply cool compresses to the affected areas. Do not scratch or rub your skin. General instructions Do not take hot showers or baths. This can make itching worse. Do not wear tight-fitting clothing. Use sunscreen. Wear protective clothing when you are outside. Avoid  anything that causes your hives. Keep a journal to help track what causes your hives. Write down: What medicines you take. What you eat and drink. What products you use on your skin. Keep all follow-up visits. Your provider will track how well  treatment is working. Contact a health care provider if: Your symptoms do not get better with medicine. Your joints are painful or swollen. You have a fever. You have pain in your abdomen. Get help right away if: Your tongue, lips, or eyelids swell. Your chest or throat feels tight. You have trouble breathing or swallowing. These symptoms may be an emergency. Use the auto-injector pen right away. Then call 911. Do not wait to see if the symptoms will go away. Do not drive yourself to the hospital. This information is not intended to replace advice given to you by your health care provider. Make sure you discuss any questions you have with your health care provider. Document Revised: 12/25/2021 Document Reviewed: 12/16/2021 Elsevier Patient Education  2024 [employer].

## 2023-09-14 NOTE — Addendum Note (Signed)
 Addended by: Ysidro Her on: 09/14/2023 10:08 AM   Modules accepted: Orders

## 2023-09-17 ENCOUNTER — Other Ambulatory Visit: Payer: Self-pay | Admitting: Family Medicine

## 2023-09-20 DIAGNOSIS — L309 Dermatitis, unspecified: Secondary | ICD-10-CM | POA: Diagnosis not present

## 2023-09-22 ENCOUNTER — Encounter: Payer: Self-pay | Admitting: Family Medicine

## 2023-09-22 ENCOUNTER — Encounter: Payer: Self-pay | Admitting: Student

## 2023-09-22 ENCOUNTER — Ambulatory Visit (HOSPITAL_BASED_OUTPATIENT_CLINIC_OR_DEPARTMENT_OTHER)
Admission: RE | Admit: 2023-09-22 | Discharge: 2023-09-22 | Disposition: A | Discharge status: Home / Self Care | Source: Ambulatory Visit | Attending: Student | Admitting: Student

## 2023-09-22 ENCOUNTER — Ambulatory Visit: Admitting: Student

## 2023-09-22 ENCOUNTER — Ambulatory Visit: Payer: Self-pay | Admitting: Student

## 2023-09-22 VITALS — BP 128/78 | HR 64 | Temp 98.1°F | Resp 12 | Ht 64.0 in | Wt 100.8 lb

## 2023-09-22 DIAGNOSIS — I6782 Cerebral ischemia: Secondary | ICD-10-CM | POA: Diagnosis not present

## 2023-09-22 DIAGNOSIS — R42 Dizziness and giddiness: Secondary | ICD-10-CM | POA: Diagnosis not present

## 2023-09-22 DIAGNOSIS — E079 Disorder of thyroid, unspecified: Secondary | ICD-10-CM

## 2023-09-22 DIAGNOSIS — S0990XA Unspecified injury of head, initial encounter: Secondary | ICD-10-CM | POA: Diagnosis not present

## 2023-09-22 DIAGNOSIS — R2681 Unsteadiness on feet: Secondary | ICD-10-CM

## 2023-09-22 DIAGNOSIS — W19XXXA Unspecified fall, initial encounter: Secondary | ICD-10-CM | POA: Diagnosis not present

## 2023-09-22 DIAGNOSIS — I951 Orthostatic hypotension: Secondary | ICD-10-CM | POA: Insufficient documentation

## 2023-09-22 DIAGNOSIS — R509 Fever, unspecified: Secondary | ICD-10-CM | POA: Insufficient documentation

## 2023-09-22 NOTE — Telephone Encounter (Signed)
 Called patient to schedule appointment today,09/22/23 and patient agreed.

## 2023-09-22 NOTE — Assessment & Plan Note (Addendum)
 Positive Romberg test. Increasingly unsteady gait referral to neurology placed for further evaluation.Patient advised to maintain adequate hydration and to rise slowly when changing positions, as dehydration and sudden postural changes may contribute to dizziness and increase the risk of syncope. RTC for persist ant or worsening symptoms

## 2023-09-22 NOTE — Assessment & Plan Note (Signed)
 Orthostatic hypotension.  Send blood pressure decreased >29mmHg SBP with position changes.  Patient advised to rise slowly when changing positions, as rapid movement may contribute to dizziness and increase the risk of syncope.

## 2023-09-22 NOTE — Progress Notes (Signed)
 Subjective:     [name], female    DOB: 05-30-1945, 78 y.o.   MRN: [id number]  Chief Complaint  Patient presents with   Misty Barker forward and hit head on ceramic floor Felt dizzy before falling     HPI [name] is a 78 yo female who recently fell and hit her head.  Reports dizziness leading to fall last Thursday (6 days ago).  Patient reports that she was going to the bathroom and when she went to stand up she felt faint,  and fell. Reports she fell she landed on right eye .  She has felt increasingly unsteady since fall. Reports feeling dizzy, head fullness,  intermittent blurry vision.  DIZZINESS Duration: days Description of symptoms: lightheaded head fullness Duration of episode: seconds Frequency of symptoms: once time with dizziness Provoking factors: sitting using bathroom, stood up and fell forward Aggravating factors:  none Triggered by rolling over in bed: no Triggered by bending over: no Aggravated by head movement: no Aggravated by exertion, coughing, loud noises: no Recent head injury: yes Recent or current viral symptoms: no History of vasovagal episodes: no Nausea: no Vomiting: no Tinnitus: no Headache: yes--- increased HA past 2 days. Not taken anything to relieve headaches. Photophobia/phonophobia: no Unsteady gait: no Postural instability: yes Diplopia, dysarthria, dysphagia or weakness: no Related to exertion: no Pallor: no Diaphoresis: no Dyspnea: no Chest pain: no  Patient denies fever, chills, SOB, CP, palpitations, dyspnea, edema, HA, N/V/D, abdominal pain, urinary or bowel symptoms, rash, weight changes, numbness, tingling, and recent illness or hospitalizations.     History of Present Illness              There are no preventive care reminders to display for this patient.  Past Medical History:  Diagnosis Date   Abdominal pain 02/01/2015   Allergy    seasonal allergies   Anemia     Arthritis    osteoarthritis-DDD hips. fingers, toes.   Blood transfusion without reported diagnosis    Complication of anesthesia    Depression    Depression with anxiety    Frequent headaches    no problems now.   Hearing loss in right ear 03/10/2015   same over last 6 months   Hip dislocation, right (HCC)    History of chicken pox    Hyperlipidemia    pt. 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    Past Surgical History:  Procedure Laterality Date   ACETABULAR REVISION Right 10/30/2015   Procedure: RIGHT HIP ACETABULAR REVISION /CONSTRAINED LINER (POSTERIOR APPROACH) ;  Surgeon: Liliane Rei, MD;  Location: [facility];  Service: Orthopedics;  Laterality: Right;   BREAST EXCISIONAL BIOPSY Right    BREAST SURGERY     lump removed, and breast reduction   EYE SURGERY     lasik   2002   HIP CLOSED REDUCTION Right 06/30/2015   Procedure: CLOSED MANIPULATION HIP;  Surgeon: Mort Ards, MD;  Location: [facility];  Service: Orthopedics;  Laterality: Right;   JOINT REPLACEMENT     3 hips on right and 1 on left   METACARPOPHALANGEAL JOINT CAPSULECTOMY / CAPSULOTOMY  1995   REDUCTION MAMMAPLASTY     SPINAL CORD STIMULATOR INSERTION N/A 12/02/2016   Procedure: LUMBAR SPINAL CORD STIMULATOR INSERTION;  Surgeon: Mort Ards, MD;  Location: [facility];  Service: Orthopedics;  Laterality: N/A;  2.5 hrs   SPINAL CORD STIMULATOR REMOVAL N/A 12/31/2016   Procedure: LUMBAR SPINAL CORD STIMULATOR REMOVAL;  Surgeon: Mort Ards, MD;  Location: [facility];  Service: Orthopedics;   Laterality: N/A;  60 mins   TONSILLECTOMY     TOTAL HIP ARTHROPLASTY  2011   left hip   TOTAL HIP ARTHROPLASTY Right 06/12/2015   Procedure: RIGHT TOTAL HIP ARTHROPLASTY ANTERIOR APPROACH;  Surgeon: Liliane Rei, MD;  Location: [facility];  Service: Orthopedics;  Laterality: Right;   TOTAL HIP REVISION Right 07/19/2015   Procedure: RIGHT HIP BEARING SURFACE REVISION;  Surgeon: Liliane Rei, MD;  Location: [facility];  Service: Orthopedics;  Laterality: Right;   TUBAL LIGATION  1980   left fallopian tube removal    Family History  Problem Relation Age of Onset   Arthritis Mother    Dementia Mother    Mental illness Father        suicide   Arthritis Maternal Grandmother    Kidney disease Son        b/l multicystic kidney disease s/p 4 transplants   Colon cancer Neg Hx     Social History   Socioeconomic History   Marital status: Married    Spouse name: Not on file   Number of children: Not on file   Years of education: 16   Highest education level: Bachelor's degree (e.g., BA, AB, BS)  Occupational History   Occupation: retired  Tobacco Use   Smoking status: Never   Smokeless tobacco: Never  Vaping Use   Vaping status: Never Used  Substance and Sexual Activity   Alcohol use: Yes    Alcohol/week: 1.0 standard drink of alcohol    Types: 1 Glasses of wine per week   Drug use: No   Sexual activity: Not on file    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, retired Audiological scientist, 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  Other Topics Concern   Not on file  Social History Narrative   Not on file   Social Drivers of Health   Financial Resource Strain: Low Risk  (06/17/2023)   Overall Financial Resource Strain (CARDIA)    Difficulty of Paying Living Expenses: Not very hard  Food Insecurity: No Food Insecurity (06/17/2023)   Hunger Vital Sign    Worried About Running Out of Food in the Last Year: Never true    Ran Out of Food in the Last Year: Never true  Transportation Needs: No Transportation Needs  (06/17/2023)   PRAPARE - Administrator, Civil Service (Medical): No    Lack of Transportation (Non-Medical): No  Physical Activity: Insufficiently Active (06/17/2023)   Exercise Vital Sign    Days of Exercise per Week: 2 days    Minutes of Exercise per Session: 20 min  Stress: Stress Concern Present (06/17/2023)   [employer] of Occupational Health - Occupational Stress Questionnaire    Feeling of Stress : To some extent  Social Connections: Socially Isolated (06/17/2023)   Social Connection and Isolation Panel [NHANES]    Frequency of Communication with Friends and Family: Once a week  Frequency of Social Gatherings with Friends and Family: Never    Attends Religious Services: Never    Database administrator or Organizations: No    Attends Engineer, structural: Not on file    Marital Status: Married  Catering manager Violence: Not At Risk (12/24/2020)   Humiliation, Afraid, Rape, and Kick questionnaire    Fear of Current or Ex-Partner: No    Emotionally Abused: No    Physically Abused: No    Sexually Abused: No    Outpatient Medications Prior to Visit  Medication Sig Dispense Refill   cholecalciferol (VITAMIN D ) 1000 units tablet Take 1,000 Units by mouth daily.     clobetasol cream (TEMOVATE) 0.05 % Apply topically 2 (two) times daily as needed.     Cyanocobalamin (VITAMIN B-12) 5000 MCG LOZG Take 1 lozenge by mouth daily.     FLUoxetine  (PROZAC ) 10 MG tablet Take 10 mg by mouth daily.     fluticasone  (FLONASE ) 50 MCG/ACT nasal spray Place 2 sprays into both nostrils daily.     furosemide  (LASIX ) 20 MG tablet Take 1 tablet (20 mg total) by mouth daily. 90 tablet 1   gabapentin  (NEURONTIN ) 300 MG capsule TAKE 1 CAPSULE(300 MG) BY MOUTH AT BEDTIME 90 capsule 2   HYDROcodone -acetaminophen  (NORCO) 10-325 MG tablet Take 1 tablet by mouth every 6 (six) hours as needed.     Ketotifen Fumarate (ZADITOR OP) Place 1 drop into both eyes 2 (two) times daily as needed  (itchy eyes).     levothyroxine  (SYNTHROID ) 25 MCG tablet TAKE 1 TABLET(25 MCG) BY MOUTH DAILY BEFORE BREAKFAST 90 tablet 3   liothyronine  (CYTOMEL ) 25 MCG tablet Take 0.5 tablets (12.5 mcg total) by mouth daily. 45 tablet 1   LORazepam  (ATIVAN ) 0.5 MG tablet Take 1-2 tablets (0.5-1 mg total) by mouth 2 (two) times daily as needed for anxiety. 60 tablet 1   metolazone  (ZAROXOLYN ) 2.5 MG tablet 1 take 1 tab daily as needed for edema 30 minutes prior to Furosemide  dose 30 tablet 2   potassium chloride  SA (KLOR-CON  M) 20 MEQ tablet Take 1 tablet (20 mEq total) by mouth 2 (two) times daily. 60 tablet 3   tiZANidine  (ZANAFLEX ) 2 MG tablet Take 1 tablet by mouth at bedtime.     predniSONE  (DELTASONE ) 10 MG tablet TAKE 3 TABLETS PO QD FOR 3 DAYS THEN TAKE 2 TABLETS PO QD FOR 3 DAYS THEN TAKE 1 TABLET PO QD FOR 3 DAYS THEN TAKE 1/2 TAB PO QD FOR 3 DAYS 20 tablet 0   No facility-administered medications prior to visit.    Allergies  Allergen Reactions   Penicillins Hives    Childhood allergy  Has patient had a PCN reaction causing immediate rash, facial/tongue/throat swelling, SOB or lightheadedness with hypotension: No Has patient had a PCN reaction causing severe rash involving mucus membranes or skin necrosis: No Has patient had a PCN reaction that required hospitalization No Has patient had a PCN reaction occurring within the last 10 years: No If all of the above answers are NO, then may proceed with Cephalosporin use.   Sulfa Antibiotics Hives    ROS    See HPI Objective:     Physical Exam  General: No acute distress. Eyes: Normal conjunctiva, anicteric. Round symmetric pupils. PERRLA Respiratory: CTAB. Respirations are non-labored. No wheezing.  Psych: Alert and oriented. Cooperative, Appropriate mood and affect, Normal judgment.  CV: RRR. No murmur. No lower extremity edema.  MSK: Normal ambulation. No clubbing or cyanosis.  Skin: No ecchymosis or signs of trauma. Skin warm and  dry, rash bilateral LE.  Neuro:  Facial movements are symmetric; no facial droop, slurred speech. Strength 3/5 in bilateral upper extremities and 4/5 in lower extremities.Muscle strength is intact and symmetric in all extremities. + Positive Romberg sign. Gait is wide-based and very unsteady; patient unable to walk in a straight line. Sensation intact. Pt is unable to stand without swaying with eyes closed.    BP 128/78 (BP Location: Left Arm, Patient Position: Sitting, Cuff Size: Normal)   Pulse 64   Temp 98.1 F (36.7 C) (Oral)   Resp 12   Ht 5' 4 (1.626 m)   Wt 100 lb 12.8 oz (45.7 kg)   SpO2 100%   BMI 17.30 kg/m  Wt Readings from Last 3 Encounters:  09/22/23 100 lb 12.8 oz (45.7 kg)  09/14/23 103 lb 3.2 oz (46.8 kg)  06/24/23 103 lb 12.8 oz (47.1 kg)       Assessment & Plan:   Problem List Items Addressed This Visit     Dizziness   Positive Romberg test. Increasingly unsteady gait referral to neurology placed for further evaluation      Relevant Orders   CBC with Differential/Platelet   Comprehensive metabolic panel with GFR   Vitamin B12   CT HEAD WO CONTRAST ( )   Ambulatory referral to Neurology   Urinalysis   Urine Culture   Fall - Primary   Stat head CT ordered. Patient  educated on the signs and symptoms of stroke. Patient voiced understanding and was instructed to seek immediate medical attention if symptoms worsen or new symptoms develop.      Relevant Orders   Urinalysis   Urine Culture   Thyroid  disease   Levothyroxine  will recheck TSH and T4 to see if this is contributing to current symptoms      Relevant Orders   TSH   T4, free   Unsteady gait   Referral to physical therapy made due to markedly unsteady gait. Requesting further evaluation for balance, strengthening exercises, and assessment for potential need for assistive ambulation equipment.      Relevant Orders   Urinalysis   Urine Culture   Ambulatory referral to Physical Therapy    Other Visit Diagnoses       Orthostatic hypotension           I have discontinued Betzabe L. Vogt-Nichols Connie's predniSONE . I am also having her maintain her cholecalciferol, Ketotifen Fumarate (ZADITOR OP), levothyroxine , HYDROcodone -acetaminophen , LORazepam , fluticasone , potassium chloride  SA, gabapentin , metolazone , liothyronine , furosemide , clobetasol cream, Vitamin B-12, FLUoxetine , and tiZANidine .  No orders of the defined types were placed in this encounter.

## 2023-09-22 NOTE — Assessment & Plan Note (Signed)
 Stat head CT ordered. Patient  educated on the signs and symptoms of stroke. Patient voiced understanding and was instructed to seek immediate medical attention if symptoms worsen or new symptoms develop.

## 2023-09-22 NOTE — Assessment & Plan Note (Signed)
 Levothyroxine  will recheck TSH and T4 to see if this is contributing to current symptoms

## 2023-09-22 NOTE — Assessment & Plan Note (Signed)
 Referral to physical therapy made due to markedly unsteady gait. Requesting further evaluation for balance, strengthening exercises, and assessment for potential need for assistive ambulation equipment.

## 2023-09-23 LAB — URINALYSIS, ROUTINE W REFLEX MICROSCOPIC
Bilirubin Urine: NEGATIVE
Hgb urine dipstick: NEGATIVE
Ketones, ur: NEGATIVE
Nitrite: NEGATIVE
Specific Gravity, Urine: 1.01 (ref 1.000–1.030)
Total Protein, Urine: NEGATIVE
Urine Glucose: NEGATIVE
Urobilinogen, UA: 0.2 (ref 0.0–1.0)
pH: 7 (ref 5.0–8.0)

## 2023-09-23 LAB — CBC WITH DIFFERENTIAL/PLATELET
Absolute Lymphocytes: 2802 {cells}/uL (ref 850–3900)
Absolute Monocytes: 968 {cells}/uL — ABNORMAL HIGH (ref 200–950)
Basophils Absolute: 62 {cells}/uL (ref 0–200)
Basophils Relative: 0.6 %
Eosinophils Absolute: 834 {cells}/uL — ABNORMAL HIGH (ref 15–500)
Eosinophils Relative: 8.1 %
HCT: 43.1 % (ref 35.0–45.0)
Hemoglobin: 14 g/dL (ref 11.7–15.5)
MCH: 27.5 pg (ref 27.0–33.0)
MCHC: 32.5 g/dL (ref 32.0–36.0)
MCV: 84.7 fL (ref 80.0–100.0)
MPV: 10 fL (ref 7.5–12.5)
Monocytes Relative: 9.4 %
Neutro Abs: 5634 {cells}/uL (ref 1500–7800)
Neutrophils Relative %: 54.7 %
Platelets: 339 10*3/uL (ref 140–400)
RBC: 5.09 10*6/uL (ref 3.80–5.10)
RDW: 13 % (ref 11.0–15.0)
Total Lymphocyte: 27.2 %
WBC: 10.3 10*3/uL (ref 3.8–10.8)

## 2023-09-23 LAB — COMPREHENSIVE METABOLIC PANEL WITH GFR
AG Ratio: 2 (calc) (ref 1.0–2.5)
ALT: 12 U/L (ref 6–29)
AST: 19 U/L (ref 10–35)
Albumin: 4.9 g/dL (ref 3.6–5.1)
Alkaline phosphatase (APISO): 58 U/L (ref 37–153)
BUN/Creatinine Ratio: 19 (calc) (ref 6–22)
BUN: 22 mg/dL (ref 7–25)
CO2: 37 mmol/L — ABNORMAL HIGH (ref 20–32)
Calcium: 10.8 mg/dL — ABNORMAL HIGH (ref 8.6–10.4)
Chloride: 88 mmol/L — ABNORMAL LOW (ref 98–110)
Creat: 1.13 mg/dL — ABNORMAL HIGH (ref 0.60–1.00)
Globulin: 2.4 g/dL (ref 1.9–3.7)
Glucose, Bld: 84 mg/dL (ref 65–99)
Potassium: 3.6 mmol/L (ref 3.5–5.3)
Sodium: 135 mmol/L (ref 135–146)
Total Bilirubin: 0.5 mg/dL (ref 0.2–1.2)
Total Protein: 7.3 g/dL (ref 6.1–8.1)
eGFR: 49 mL/min/{1.73_m2} — ABNORMAL LOW (ref >=60)

## 2023-09-23 LAB — URINE CULTURE
MICRO NUMBER:: 16567267
SPECIMEN QUALITY:: ADEQUATE

## 2023-09-23 LAB — VITAMIN B12: Vitamin B-12: >2000 pg/mL — ABNORMAL HIGH (ref 200–1100)

## 2023-09-24 DIAGNOSIS — Z79899 Other long term (current) drug therapy: Secondary | ICD-10-CM | POA: Diagnosis not present

## 2023-09-29 DIAGNOSIS — H35361 Drusen (degenerative) of macula, right eye: Secondary | ICD-10-CM | POA: Diagnosis not present

## 2023-09-29 DIAGNOSIS — H10413 Chronic giant papillary conjunctivitis, bilateral: Secondary | ICD-10-CM | POA: Diagnosis not present

## 2023-09-29 DIAGNOSIS — H40013 Open angle with borderline findings, low risk, bilateral: Secondary | ICD-10-CM | POA: Diagnosis not present

## 2023-09-29 DIAGNOSIS — D3131 Benign neoplasm of right choroid: Secondary | ICD-10-CM | POA: Diagnosis not present

## 2023-09-29 DIAGNOSIS — H25813 Combined forms of age-related cataract, bilateral: Secondary | ICD-10-CM | POA: Diagnosis not present

## 2023-11-24 DIAGNOSIS — M25551 Pain in right hip: Secondary | ICD-10-CM | POA: Diagnosis not present

## 2023-11-24 DIAGNOSIS — Z96641 Presence of right artificial hip joint: Secondary | ICD-10-CM | POA: Diagnosis not present

## 2023-12-15 ENCOUNTER — Other Ambulatory Visit: Payer: Self-pay | Admitting: Family Medicine

## 2023-12-26 NOTE — Assessment & Plan Note (Signed)
 Developed with medication changes. Encouraged increased hydration and fiber in diet. Daily probiotics. If bowels not moving can use MOM 2 tbls po in 4 oz of warm prune juice by mouth every 2-3 days. If no results then repeat in 4 hours with  Dulcolax suppository pr, may repeat again in 4 more hours as needed. Seek care if symptoms worsen. Consider daily Miralax and/or Dulcolax if symptoms persist.

## 2023-12-26 NOTE — Assessment & Plan Note (Signed)
 Encouraged good sleep hygiene such as dark, quiet room. No blue/green glowing lights such as computer screens in bedroom. No alcohol or stimulants in evening. Cut down on caffeine as able. Regular exercise is helpful but not just prior to bed time.

## 2023-12-27 ENCOUNTER — Ambulatory Visit (INDEPENDENT_AMBULATORY_CARE_PROVIDER_SITE_OTHER): Admitting: Family Medicine

## 2023-12-27 ENCOUNTER — Encounter: Payer: Self-pay | Admitting: Family Medicine

## 2023-12-27 VITALS — BP 126/80 | HR 75 | Resp 16 | Ht 64.0 in | Wt 104.8 lb

## 2023-12-27 DIAGNOSIS — E079 Disorder of thyroid, unspecified: Secondary | ICD-10-CM | POA: Diagnosis not present

## 2023-12-27 DIAGNOSIS — E785 Hyperlipidemia, unspecified: Secondary | ICD-10-CM | POA: Diagnosis not present

## 2023-12-27 DIAGNOSIS — Z23 Encounter for immunization: Secondary | ICD-10-CM | POA: Diagnosis not present

## 2023-12-27 DIAGNOSIS — K5900 Constipation, unspecified: Secondary | ICD-10-CM

## 2023-12-27 DIAGNOSIS — M5416 Radiculopathy, lumbar region: Secondary | ICD-10-CM | POA: Diagnosis not present

## 2023-12-27 DIAGNOSIS — G4700 Insomnia, unspecified: Secondary | ICD-10-CM

## 2023-12-27 DIAGNOSIS — M25551 Pain in right hip: Secondary | ICD-10-CM | POA: Diagnosis not present

## 2023-12-27 DIAGNOSIS — M961 Postlaminectomy syndrome, not elsewhere classified: Secondary | ICD-10-CM | POA: Diagnosis not present

## 2023-12-27 DIAGNOSIS — R0600 Dyspnea, unspecified: Secondary | ICD-10-CM

## 2023-12-27 DIAGNOSIS — M791 Myalgia, unspecified site: Secondary | ICD-10-CM

## 2023-12-27 DIAGNOSIS — M549 Dorsalgia, unspecified: Secondary | ICD-10-CM | POA: Diagnosis not present

## 2023-12-27 MED ORDER — GABAPENTIN 100 MG PO CAPS: 100.0000 mg | ORAL_CAPSULE | Freq: Every day | ORAL | 2 refills | Status: DC

## 2023-12-27 NOTE — Patient Instructions (Signed)

## 2023-12-28 ENCOUNTER — Encounter: Payer: Self-pay | Admitting: Family Medicine

## 2023-12-28 NOTE — Progress Notes (Signed)
 Subjective:    [name], female    DOB: 1945/12/30, 78 y.o.   MRN: [id number]  Chief Complaint  Patient presents with   Medical Management of Chronic Issues    Patient presents today for a 6 month follow-up.    HPI Discussed the use of AI scribe software for clinical note transcription with the patient, who gave verbal consent to proceed.  History of Present Illness Misty Barker is a 78 year old female who presents with worsening shortness of breath on exertion.  She experiences worsening shortness of breath over the summer, particularly during exertion such as gardening and in hot weather. No shortness of breath while sitting, lying down, or talking. No associated chest pressure, palpitations, nausea, or vomiting.  She describes episodes of dizziness and lightheadedness accompanying the shortness of breath, noting a need to grab onto something for stability. She has poor balance and has had previous hip and back surgeries. She feels 'not connected' and mentions that her 'skeleton's kind of doing its own thing.' She has tried using a cane but finds it unhelpful as she tends to trip over it.  She is currently taking gabapentin  300 mg at bedtime for pain management. She experiences hip pain that wakes her up at least four times a night and is unsure if the gabapentin  is effective in managing her pain.      Past Medical History:  Diagnosis Date   Abdominal pain 02/01/2015   Allergy    seasonal allergies   Anemia    Arthritis    osteoarthritis-DDD hips. fingers, toes.   Blood transfusion without reported diagnosis    Complication of anesthesia    Depression    Depression with anxiety    Frequent headaches    no problems now.   Hearing loss in right ear 03/10/2015   same over last 6 months   Hip dislocation, right (HCC)    History of chicken pox    Hyperlipidemia    pt. state no hx   Hypokalemia 02/04/2017    Hypothyroidism    supplement used   Osteoporosis    Peripheral neuropathy 02/01/2015   right handcarpal tunnel   PONV (postoperative nausea and vomiting)    Preventative health care 02/04/2017   Right hip pain 05/24/2015   Scoliosis 02/01/2015   Spinal stenosis of lumbar region 10/23/2016   Thyroid  disease    URI, acute 03/10/2015   UTI (urinary tract infection) 12/14/2016   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    Past Surgical History:  Procedure Laterality Date   ACETABULAR REVISION Right 10/30/2015   Procedure: RIGHT HIP ACETABULAR REVISION /CONSTRAINED LINER (POSTERIOR APPROACH) ;  Surgeon: Dempsey Moan, MD;  Location: [facility];  Service: Orthopedics;  Laterality: Right;   BREAST EXCISIONAL BIOPSY Right    BREAST SURGERY     lump removed, and breast reduction   EYE SURGERY     lasik   2002   HIP CLOSED REDUCTION Right 06/30/2015   Procedure: CLOSED MANIPULATION HIP;  Surgeon: Donaciano Sprang, MD;  Location: [facility];  Service: Orthopedics;  Laterality: Right;   JOINT REPLACEMENT     3 hips on right and 1 on left   METACARPOPHALANGEAL JOINT CAPSULECTOMY / CAPSULOTOMY  1995   REDUCTION MAMMAPLASTY     SPINAL CORD STIMULATOR INSERTION N/A 12/02/2016   Procedure: LUMBAR SPINAL CORD STIMULATOR INSERTION;  Surgeon: Sprang Donaciano, MD;  Location: [facility];  Service: Orthopedics;  Laterality: N/A;  2.5 hrs   SPINAL CORD STIMULATOR REMOVAL N/A 12/31/2016   Procedure: LUMBAR SPINAL CORD STIMULATOR REMOVAL;  Surgeon: Burnetta Aures, MD;  Location: [facility];  Service: Orthopedics;  Laterality: N/A;  60 mins   TONSILLECTOMY     TOTAL HIP ARTHROPLASTY  2011   left hip   TOTAL HIP ARTHROPLASTY Right 06/12/2015   Procedure: RIGHT TOTAL HIP ARTHROPLASTY ANTERIOR APPROACH;  Surgeon: Dempsey Moan, MD;  Location: [facility];  Service: Orthopedics;  Laterality: Right;   TOTAL HIP REVISION Right 07/19/2015   Procedure: RIGHT HIP BEARING SURFACE REVISION;  Surgeon: Dempsey Moan, MD;  Location: [facility];   Service: Orthopedics;  Laterality: Right;   TUBAL LIGATION  1980   left fallopian tube removal    Family History  Problem Relation Age of Onset   Arthritis Mother    Dementia Mother    Mental illness Father        suicide   Arthritis Maternal Grandmother    Kidney disease Son        b/l multicystic kidney disease s/p 4 transplants   Colon cancer Neg Hx     Social History   Socioeconomic History   Marital status: Married    Spouse name: Not on file   Number of children: Not on file   Years of education: 16   Highest education level: Bachelor's degree (e.g., BA, AB, BS)  Occupational History   Occupation: retired  Tobacco Use   Smoking status: Never   Smokeless tobacco: Never  Vaping Use   Vaping status: Never Used  Substance and Sexual Activity   Alcohol use: Yes    Alcohol/week: 1.0 standard drink of alcohol    Types: 1 Glasses of wine per week   Drug use: No   Sexual activity: Not on file    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, retired Audiological scientist, 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  Other Topics Concern   Not on file  Social History Narrative   Not on file   Social Drivers of Health   Financial Resource Strain: Low Risk  (12/20/2023)   Overall Financial Resource Strain (CARDIA)    Difficulty of Paying Living Expenses: Not very hard  Food Insecurity: No Food Insecurity (12/20/2023)   Hunger Vital Sign    Worried About Running Out of Food in the Last Year: Never true    Ran Out of Food in the Last Year: Never true  Transportation Needs: No Transportation Needs (12/20/2023)   PRAPARE - Administrator, Civil Service (Medical): No    Lack of Transportation (Non-Medical): No  Physical Activity: Insufficiently Active (12/20/2023)   Exercise Vital Sign    Days of Exercise per Week: 6 days    Minutes of Exercise per Session: 20 min  Stress: Stress Concern Present (12/20/2023)   [employer] of Occupational Health - Occupational Stress Questionnaire    Feeling of  Stress: To some extent  Social Connections: Unknown (12/20/2023)   Social Connection and Isolation Panel    Frequency of Communication with Friends and Family: Twice a week    Frequency of Social Gatherings with Friends and Family: Patient declined    Attends Religious Services: Patient declined    Database administrator or Organizations: No    Attends Banker Meetings: Not on file    Marital Status: Married  Intimate Partner Violence: Not At Risk (12/24/2020)   Humiliation, Afraid, Rape, and Kick questionnaire    Fear of Current or Ex-Partner: No  Emotionally Abused: No    Physically Abused: No    Sexually Abused: No    Outpatient Medications Prior to Visit  Medication Sig Dispense Refill   fluticasone  (FLONASE ) 50 MCG/ACT nasal spray Place 2 sprays into both nostrils daily.     furosemide  (LASIX ) 20 MG tablet Take 1 tablet (20 mg total) by mouth daily. 90 tablet 1   HYDROcodone -acetaminophen  (NORCO) 10-325 MG tablet Take 1 tablet by mouth every 6 (six) hours as needed.     Ketotifen Fumarate (ZADITOR OP) Place 1 drop into both eyes 2 (two) times daily as needed (itchy eyes).     levothyroxine  (SYNTHROID ) 25 MCG tablet TAKE 1 TABLET(25 MCG) BY MOUTH DAILY BEFORE BREAKFAST 90 tablet 3   liothyronine  (CYTOMEL ) 25 MCG tablet Take 0.5 tablets (12.5 mcg total) by mouth daily. 45 tablet 1   LORazepam  (ATIVAN ) 0.5 MG tablet Take 1-2 tablets (0.5-1 mg total) by mouth 2 (two) times daily as needed for anxiety. 60 tablet 1   metolazone  (ZAROXOLYN ) 2.5 MG tablet 1 take 1 tab daily as needed for edema 30 minutes prior to Furosemide  dose 30 tablet 2   potassium chloride  SA (KLOR-CON  M) 20 MEQ tablet Take 1 tablet (20 mEq total) by mouth 2 (two) times daily. 60 tablet 3   tiZANidine  (ZANAFLEX ) 2 MG tablet Take 1 tablet by mouth at bedtime.     gabapentin  (NEURONTIN ) 300 MG capsule TAKE 1 CAPSULE(300 MG) BY MOUTH AT BEDTIME 90 capsule 2   cholecalciferol (VITAMIN D ) 1000 units tablet Take  1,000 Units by mouth daily.     clobetasol cream (TEMOVATE) 0.05 % Apply topically 2 (two) times daily as needed.     FLUoxetine  (PROZAC ) 10 MG tablet Take 10 mg by mouth daily.     No facility-administered medications prior to visit.    Allergies  Allergen Reactions   Penicillins Hives    Childhood allergy  Has patient had a PCN reaction causing immediate rash, facial/tongue/throat swelling, SOB or lightheadedness with hypotension: No Has patient had a PCN reaction causing severe rash involving mucus membranes or skin necrosis: No Has patient had a PCN reaction that required hospitalization No Has patient had a PCN reaction occurring within the last 10 years: No If all of the above answers are NO, then may proceed with Cephalosporin use.   Sulfa Antibiotics Hives    Review of Systems  Constitutional:  Positive for malaise/fatigue. Negative for fever.  HENT:  Negative for congestion.   Eyes:  Negative for blurred vision.  Respiratory:  Positive for shortness of breath.   Cardiovascular:  Positive for chest pain. Negative for palpitations and leg swelling.  Gastrointestinal:  Negative for abdominal pain, blood in stool and nausea.  Genitourinary:  Negative for dysuria and frequency.  Musculoskeletal:  Positive for back pain, joint pain and myalgias. Negative for falls.  Skin:  Negative for rash.  Neurological:  Negative for dizziness, loss of consciousness and headaches.  Endo/Heme/Allergies:  Negative for environmental allergies.  Psychiatric/Behavioral:  Negative for depression. The patient has insomnia. The patient is not nervous/anxious.        Objective:    Physical Exam Constitutional:      General: She is not in acute distress.    Appearance: Normal appearance. She is well-developed. She is not toxic-appearing.     Comments: frail  HENT:     Head: Normocephalic and atraumatic.     Right Ear: External ear normal.     Left Ear: External ear normal.  Nose: Nose  normal.  Eyes:     General:        Right eye: No discharge.        Left eye: No discharge.     Conjunctiva/sclera: Conjunctivae normal.  Neck:     Thyroid : No thyromegaly.  Cardiovascular:     Rate and Rhythm: Normal rate and regular rhythm.     Heart sounds: Normal heart sounds. No murmur heard. Pulmonary:     Effort: Pulmonary effort is normal. No respiratory distress.     Breath sounds: Normal breath sounds.  Abdominal:     General: Bowel sounds are normal.     Palpations: Abdomen is soft.     Tenderness: There is no abdominal tenderness. There is no guarding.  Musculoskeletal:        General: Normal range of motion.     Cervical back: Neck supple.  Lymphadenopathy:     Cervical: No cervical adenopathy.  Skin:    General: Skin is warm and dry.  Neurological:     Mental Status: She is alert and oriented to person, place, and time.  Psychiatric:        Mood and Affect: Mood normal.        Behavior: Behavior normal.        Thought Content: Thought content normal.        Judgment: Judgment normal.     BP 126/80   Pulse 75   Resp 16   Ht 5' 4 (1.626 m)   Wt 104 lb 12.8 oz (47.5 kg)   SpO2 95%   BMI 17.99 kg/m  Wt Readings from Last 3 Encounters:  12/27/23 104 lb 12.8 oz (47.5 kg)  09/22/23 100 lb 12.8 oz (45.7 kg)  09/14/23 103 lb 3.2 oz (46.8 kg)    Diabetic Foot Exam - Simple   No data filed    Lab Results  Component Value Date   WBC 10.3 09/22/2023   HGB 14.0 09/22/2023   HCT 43.1 09/22/2023   PLT 339 09/22/2023   GLUCOSE 84 09/22/2023   CHOL 298 (H) 03/09/2023   TRIG 103.0 03/09/2023   HDL 84.80 03/09/2023   LDLCALC 192 (H) 03/09/2023   ALT 12 09/22/2023   AST 19 09/22/2023   NA 135 09/22/2023   K 3.6 09/22/2023   CL 88 (L) 09/22/2023   CREATININE 1.13 (H) 09/22/2023   BUN 22 09/22/2023   CO2 37 (H) 09/22/2023   TSH 0.44 06/10/2023   INR 0.95 10/29/2015    Lab Results  Component Value Date   TSH 0.44 06/10/2023   Lab Results   Component Value Date   WBC 10.3 09/22/2023   HGB 14.0 09/22/2023   HCT 43.1 09/22/2023   MCV 84.7 09/22/2023   PLT 339 09/22/2023   Lab Results  Component Value Date   NA 135 09/22/2023   K 3.6 09/22/2023   CO2 37 (H) 09/22/2023   GLUCOSE 84 09/22/2023   BUN 22 09/22/2023   CREATININE 1.13 (H) 09/22/2023   BILITOT 0.5 09/22/2023   ALKPHOS 47 07/01/2023   AST 19 09/22/2023   ALT 12 09/22/2023   PROT 7.3 09/22/2023   ALBUMIN  4.7 07/01/2023   CALCIUM 10.8 (H) 09/22/2023   ANIONGAP 9 09/19/2021   EGFR 49 (L) 09/22/2023   GFR 43.15 (L) 07/01/2023   Lab Results  Component Value Date   CHOL 298 (H) 03/09/2023   Lab Results  Component Value Date   HDL 84.80 03/09/2023   Lab  Results  Component Value Date   LDLCALC 192 (H) 03/09/2023   Lab Results  Component Value Date   TRIG 103.0 03/09/2023   Lab Results  Component Value Date   CHOLHDL 4 03/09/2023   No results found for: HGBA1C     Assessment & Plan:  Constipation, unspecified constipation type Assessment & Plan: Developed with medication changes. Encouraged increased hydration and fiber in diet. Daily probiotics. If bowels not moving can use MOM 2 tbls po in 4 oz of warm prune juice by mouth every 2-3 days. If no results then repeat in 4 hours with  Dulcolax suppository pr, may repeat again in 4 more hours as needed. Seek care if symptoms worsen. Consider daily Miralax  and/or Dulcolax if symptoms persist.  Orders: -     CBC with Differential/Platelet; Future  Insomnia, unspecified type Assessment & Plan: Encouraged good sleep hygiene such as dark, quiet room. No blue/green glowing lights such as computer screens in bedroom. No alcohol or stimulants in evening. Cut down on caffeine as able. Regular exercise is helpful but not just prior to bed time.    Dyspnea, unspecified type -     ECHOCARDIOGRAM COMPLETE; Future  Myalgia  Hypercalcemia -     VITAMIN D  25 Hydroxy (Vit-D Deficiency, Fractures);  Future -     Parathyroid  hormone, intact (no Ca); Future  Thyroid  disease -     TSH; Future  Hyperlipidemia, unspecified hyperlipidemia type -     Lipid panel; Future -     Comprehensive metabolic panel with GFR; Future  Need for influenza vaccination -     Flu vaccine HIGH DOSE PF(Fluzone Trivalent)  Other orders -     Gabapentin ; Take 1-2 capsules (100-200 mg total) by mouth at bedtime. As directed  Dispense: 60 capsule; Refill: 2    Assessment and Plan Assessment & Plan Dyspnea on exertion Worsening dyspnea over the summer, particularly with exertion such as gardening and in the heat. No associated chest pressure, palpitations, nausea, or vomiting. Possible deconditioning considered. Dizziness and lightheadedness occur with episodes of dyspnea. Previous echocardiogram was normal, but symptoms have worsened since then. - Order echocardiogram - Refer to cardiologist  Gait instability and balance impairment Chronic balance issues exacerbated by previous surgeries and aging. Experiences wooziness and needs to grab onto objects for stability. Difficulty with stepping up and down. Previous physical therapy did not provide lasting improvement. Cane use was ineffective due to tripping. - Recommend trial of walking sticks for stability - Provide handicap parking placard  Chronic pain due to osteoarthritis and prior joint replacements Chronic pain, particularly in hips, causing sleep disturbances. Currently on gabapentin  300 mg at bedtime. Considering tapering off gabapentin  to assess its impact on pain and potential side effects such as sedation and confusion. Discussed a gradual tapering plan to minimize withdrawal effects and assess pain control. - Discontinue gabapentin  300 mg - Prescribe gabapentin  100 mg with instructions to take 200 mg at bedtime for several weeks, then reduce to 100 mg, and assess pain levels - Provide 60 tablets with two refills for flexibility in  tapering  General Health Maintenance Flu vaccination needed. - Administer flu shot  Follow-Up Routine follow-up and lab work planning discussed. Labs to be done at [facility] within the next month to ensure updated results for future care. - Schedule follow-up appointment in March - Order lab work to be done at [facility] within the next month - Check vitamin levels, kidney panel, cholesterol, thyroid , and PTH  Recording  duration: 14 minutes     Harlene Horton, MD

## 2024-01-05 ENCOUNTER — Other Ambulatory Visit (INDEPENDENT_AMBULATORY_CARE_PROVIDER_SITE_OTHER)

## 2024-01-05 ENCOUNTER — Ambulatory Visit: Payer: Self-pay | Admitting: Family Medicine

## 2024-01-05 DIAGNOSIS — E785 Hyperlipidemia, unspecified: Secondary | ICD-10-CM

## 2024-01-05 DIAGNOSIS — E079 Disorder of thyroid, unspecified: Secondary | ICD-10-CM | POA: Diagnosis not present

## 2024-01-05 DIAGNOSIS — K5900 Constipation, unspecified: Secondary | ICD-10-CM

## 2024-01-05 LAB — CBC WITH DIFFERENTIAL/PLATELET
Basophils Absolute: 0 K/uL (ref 0.0–0.1)
Basophils Relative: 0.7 % (ref 0.0–3.0)
Eosinophils Absolute: 0.5 K/uL (ref 0.0–0.7)
Eosinophils Relative: 7.3 % — ABNORMAL HIGH (ref 0.0–5.0)
HCT: 37.1 % (ref 36.0–46.0)
Hemoglobin: 12.3 g/dL (ref 12.0–15.0)
Lymphocytes Relative: 22.4 % (ref 12.0–46.0)
Lymphs Abs: 1.4 K/uL (ref 0.7–4.0)
MCHC: 33 g/dL (ref 30.0–36.0)
MCV: 82.9 fl (ref 78.0–100.0)
Monocytes Absolute: 0.7 K/uL (ref 0.1–1.0)
Monocytes Relative: 11.1 % (ref 3.0–12.0)
Neutro Abs: 3.6 K/uL (ref 1.4–7.7)
Neutrophils Relative %: 58.5 % (ref 43.0–77.0)
Platelets: 262 K/uL (ref 150.0–400.0)
RBC: 4.47 Mil/uL (ref 3.87–5.11)
RDW: 14.2 % (ref 11.5–15.5)
WBC: 6.2 K/uL (ref 4.0–10.5)

## 2024-01-05 LAB — COMPREHENSIVE METABOLIC PANEL WITH GFR
ALT: 13 U/L (ref 0–35)
AST: 26 U/L (ref 0–37)
Albumin: 4.5 g/dL (ref 3.5–5.2)
Alkaline Phosphatase: 50 U/L (ref 39–117)
BUN: 21 mg/dL (ref 6–23)
CO2: 36 meq/L — ABNORMAL HIGH (ref 19–32)
Calcium: 10.2 mg/dL (ref 8.4–10.5)
Chloride: 94 meq/L — ABNORMAL LOW (ref 96–112)
Creatinine, Ser: 1.07 mg/dL (ref 0.40–1.20)
GFR: 49.34 mL/min — ABNORMAL LOW (ref >60.00)
Glucose, Bld: 96 mg/dL (ref 70–99)
Potassium: 4.1 meq/L (ref 3.5–5.1)
Sodium: 136 meq/L (ref 135–145)
Total Bilirubin: 0.5 mg/dL (ref 0.2–1.2)
Total Protein: 6.6 g/dL (ref 6.0–8.3)

## 2024-01-05 LAB — LIPID PANEL
Cholesterol: 251 mg/dL — ABNORMAL HIGH (ref 0–200)
HDL: 70.6 mg/dL (ref >39.00)
LDL Cholesterol: 165 mg/dL — ABNORMAL HIGH (ref 0–99)
NonHDL: 180.54
Total CHOL/HDL Ratio: 4
Triglycerides: 80 mg/dL (ref 0.0–149.0)
VLDL: 16 mg/dL (ref 0.0–40.0)

## 2024-01-05 LAB — TSH: TSH: 0.12 u[IU]/mL — ABNORMAL LOW (ref 0.35–5.50)

## 2024-01-05 LAB — VITAMIN D 25 HYDROXY (VIT D DEFICIENCY, FRACTURES): VITD: 45.91 ng/mL (ref 30.00–100.00)

## 2024-01-06 LAB — PARATHYROID HORMONE, INTACT (NO CA): PTH: 51 pg/mL (ref 16–77)

## 2024-01-25 ENCOUNTER — Ambulatory Visit (HOSPITAL_BASED_OUTPATIENT_CLINIC_OR_DEPARTMENT_OTHER)
Admission: RE | Admit: 2024-01-25 | Discharge: 2024-01-25 | Disposition: A | Discharge status: Home / Self Care | Source: Ambulatory Visit | Attending: Family Medicine | Admitting: Family Medicine

## 2024-01-25 DIAGNOSIS — R0600 Dyspnea, unspecified: Secondary | ICD-10-CM | POA: Insufficient documentation

## 2024-01-26 LAB — ECHOCARDIOGRAM COMPLETE
AR max vel: 1.31 cm2
AV Area VTI: 1.23 cm2
AV Area mean vel: 1.2 cm2
AV Mean grad: 6.5 mmHg
AV Peak grad: 10.9 mmHg
AV Vena cont: 0.3 cm
Ao pk vel: 1.65 m/s
Area-P 1/2: 2.96 cm2
Calc EF: 72 %
MV M vel: 4.62 m/s
MV Peak grad: 85.4 mmHg
S' Lateral: 2 cm
Single Plane A2C EF: 76.1 %
Single Plane A4C EF: 67.7 %

## 2024-01-30 ENCOUNTER — Other Ambulatory Visit: Payer: Self-pay | Admitting: Family Medicine

## 2024-01-30 DIAGNOSIS — I7121 Aneurysm of the ascending aorta, without rupture: Secondary | ICD-10-CM

## 2024-02-04 ENCOUNTER — Telehealth (HOSPITAL_BASED_OUTPATIENT_CLINIC_OR_DEPARTMENT_OTHER): Payer: Self-pay

## 2024-02-06 ENCOUNTER — Encounter (HOSPITAL_BASED_OUTPATIENT_CLINIC_OR_DEPARTMENT_OTHER): Payer: Self-pay

## 2024-02-06 ENCOUNTER — Ambulatory Visit (HOSPITAL_BASED_OUTPATIENT_CLINIC_OR_DEPARTMENT_OTHER): Admission: RE | Admit: 2024-02-06 | Source: Ambulatory Visit

## 2024-02-10 ENCOUNTER — Other Ambulatory Visit: Payer: Self-pay | Admitting: Family Medicine

## 2024-02-10 ENCOUNTER — Telehealth: Payer: Self-pay

## 2024-02-10 DIAGNOSIS — I700 Atherosclerosis of aorta: Secondary | ICD-10-CM

## 2024-02-10 DIAGNOSIS — I7121 Aneurysm of the ascending aorta, without rupture: Secondary | ICD-10-CM

## 2024-02-10 NOTE — Telephone Encounter (Signed)
 Spoke w/ [facility]- informed order has been changed.

## 2024-02-10 NOTE — Telephone Encounter (Signed)
 Copied from CRM #[id number]. Topic: Clinical - Request for Lab/Test Order >> Feb 10, 2024 11:03 AM Mercedes MATSU wrote: Reason for CRM: Imaging called in stating that the patient needs imaging WITH AND WITHOUT contrast but the order was only written for WITHOUT contrast. They are requesting a new order be sent over as soon as possible.    Phone: [phone] requesting verbal order change instead of faxing.

## 2024-02-10 NOTE — Telephone Encounter (Signed)
**Note De-identified  [place] Obfuscation** Please advise 

## 2024-02-11 ENCOUNTER — Encounter (HOSPITAL_COMMUNITY): Payer: Self-pay

## 2024-02-11 ENCOUNTER — Ambulatory Visit: Payer: Self-pay | Admitting: Family Medicine

## 2024-02-11 ENCOUNTER — Ambulatory Visit (HOSPITAL_COMMUNITY)
Admission: RE | Admit: 2024-02-11 | Discharge: 2024-02-11 | Disposition: A | Discharge status: Home / Self Care | Source: Ambulatory Visit | Attending: Family Medicine | Admitting: Family Medicine

## 2024-02-11 DIAGNOSIS — I7121 Aneurysm of the ascending aorta, without rupture: Secondary | ICD-10-CM | POA: Diagnosis not present

## 2024-02-11 DIAGNOSIS — I77810 Thoracic aortic ectasia: Secondary | ICD-10-CM | POA: Diagnosis not present

## 2024-02-11 DIAGNOSIS — I517 Cardiomegaly: Secondary | ICD-10-CM | POA: Diagnosis not present

## 2024-02-13 ENCOUNTER — Other Ambulatory Visit: Payer: Self-pay | Admitting: Family Medicine

## 2024-02-13 DIAGNOSIS — I7121 Aneurysm of the ascending aorta, without rupture: Secondary | ICD-10-CM

## 2024-02-14 ENCOUNTER — Other Ambulatory Visit: Payer: Self-pay | Admitting: Family Medicine

## 2024-02-14 MED ORDER — LORAZEPAM 0.5 MG PO TABS: 0.5000 mg | ORAL_TABLET | Freq: Two times a day (BID) | ORAL | 1 refills | Status: DC | PRN

## 2024-03-08 ENCOUNTER — Encounter: Payer: Self-pay | Admitting: Family Medicine

## 2024-03-08 MED ORDER — FUROSEMIDE 20 MG PO TABS: 20.0000 mg | ORAL_TABLET | Freq: Every day | ORAL | 1 refills | Status: AC

## 2024-03-08 MED ORDER — LIOTHYRONINE SODIUM 25 MCG PO TABS: 12.5000 ug | ORAL_TABLET | Freq: Every day | ORAL | 1 refills | Status: AC

## 2024-03-14 ENCOUNTER — Ambulatory Visit: Attending: Internal Medicine | Admitting: Internal Medicine

## 2024-03-14 ENCOUNTER — Encounter: Payer: Self-pay | Admitting: Internal Medicine

## 2024-03-14 VITALS — BP 135/75 | HR 64 | Ht 65.0 in | Wt 106.7 lb

## 2024-03-14 DIAGNOSIS — Z79899 Other long term (current) drug therapy: Secondary | ICD-10-CM | POA: Diagnosis not present

## 2024-03-14 DIAGNOSIS — I2510 Atherosclerotic heart disease of native coronary artery without angina pectoris: Secondary | ICD-10-CM | POA: Insufficient documentation

## 2024-03-14 DIAGNOSIS — R0600 Dyspnea, unspecified: Secondary | ICD-10-CM | POA: Insufficient documentation

## 2024-03-14 DIAGNOSIS — E785 Hyperlipidemia, unspecified: Secondary | ICD-10-CM | POA: Diagnosis not present

## 2024-03-14 DIAGNOSIS — I700 Atherosclerosis of aorta: Secondary | ICD-10-CM | POA: Insufficient documentation

## 2024-03-14 DIAGNOSIS — I7121 Aneurysm of the ascending aorta, without rupture: Secondary | ICD-10-CM | POA: Insufficient documentation

## 2024-03-14 MED ORDER — ASPIRIN 81 MG PO TBEC: 81.0000 mg | DELAYED_RELEASE_TABLET | Freq: Every day | ORAL | 3 refills | Status: AC

## 2024-03-14 MED ORDER — ROSUVASTATIN CALCIUM 5 MG PO TABS: 5.0000 mg | ORAL_TABLET | Freq: Every day | ORAL | 3 refills | Status: AC

## 2024-03-14 NOTE — Progress Notes (Addendum)
 Cardiology Office Note   Date:  03/14/2024  ID:  Misty Barker, DOB 01/29/1946, MRN [id number] PCP: Domenica Harlene LABOR, MD  [facility] Providers Cardiologist:  Emeline FORBES Calender, DO     History of Present Illness Misty Barker is a 78 y.o. female with a past medical history of hyperlipidemia, hypothyroidism, peripheral neuropathy with carpal tunnel syndrome who was seen by her PCP on 12/27/2023 with dyspnea on exertion prompting an echocardiogram which showed a dilated ascending aorta prompting cardiology referral.  She has been having worsening dyspnea on exertion since the summer and is now short of breath with minimal activity while at home including light housework.  This is new for her.  She does not get chest pain with this.  She does not smoke.  She has had longstanding hyperlipidemia but has been resistant to starting statins.  She also gets lower extremity swelling for which she takes Lasix  for.  Otherwise no complaints.    ROS:  Review of Systems  All other systems reviewed and are negative.   Physical Exam  Physical Exam Vitals and nursing note reviewed.  Constitutional:      Appearance: Normal appearance.  HENT:     Head: Normocephalic and atraumatic.  Eyes:     Conjunctiva/sclera: Conjunctivae normal.  Cardiovascular:     Rate and Rhythm: Normal rate and regular rhythm.     Heart sounds: Murmur heard.  Pulmonary:     Effort: Pulmonary effort is normal.     Breath sounds: Normal breath sounds.  Musculoskeletal:        General: No swelling or tenderness.  Skin:    Coloration: Skin is not jaundiced or pale.  Neurological:     Mental Status: She is alert.     VS:  BP 135/75   Pulse 64   Ht 5' 5 (1.651 m)   Wt 106 lb 11.2 oz (48.4 kg)   SpO2 94%   BMI 17.76 kg/m         Wt Readings from Last 3 Encounters:  03/14/24 106 lb 11.2 oz (48.4 kg)  12/27/23 104 lb 12.8 oz (47.5 kg)  09/22/23 100 lb 12.8 oz (45.7 kg)     EKG  Interpretation Date/Time:  Tuesday March 14 2024 15:35:36 EST Ventricular Rate:  64 PR Interval:  168 QRS Duration:  78 QT Interval:  444 QTC Calculation: 458 R Axis:   71  Text Interpretation: Sinus rhythm with Premature atrial complexes Septal infarct , age undetermined When compared with ECG of 19-Sep-2021 13:06, Premature atrial complexes NOW PRESENT Confirmed by Calender Emeline [phone]) on 03/14/2024 3:40:17 PM    Studies Reviewed   Prior CV Studies: ECHO COMPLETE WO IMAGING ENHANCING AGENT 01/25/2024  Narrative ECHOCARDIOGRAM REPORT    Patient Name:   Misty Barker Date of Exam: 01/25/2024 Medical Rec #:  [id number]                Height:       64.0 in Accession #:    [id number]               Weight:       104.8 lb Date of Birth:  January 08, 1946                BSA:          1.487 m Patient Age:    78 years                 BP:  126/80 mmHg Patient Gender: F                        HR:           64 bpm. Exam Location:  [place]  Procedure: 2D Echo, Cardiac Doppler, Color Doppler and Strain Analysis (Both Spectral and Color Flow Doppler were utilized during procedure).  Indications:    R06.9 DOE  History:        Patient has prior history of Echocardiogram examinations, most recent 01/08/2021. Signs/Symptoms:Dyspnea, Hypotension and Dizziness/Lightheadedness; Risk Factors:Dyslipidemia and Non-Smoker.  Sonographer:    Alan Greenhouse RDMS, RVT, RDCS Referring Phys: 75 STACEY A BLYTH   Sonographer Comments: Image acquisition challenging due to patient body habitus and patient discomfort. IMPRESSIONS   1. Left ventricular ejection fraction, by estimation, is 60 to 65%. The left ventricle has normal function. The left ventricle has no regional wall motion abnormalities. Left ventricular diastolic parameters were normal. The average left ventricular global longitudinal strain is -18.7 %. 2. Right ventricular systolic function is normal. The right  ventricular size is normal. There is normal pulmonary artery systolic pressure. The estimated right ventricular systolic pressure is 22.9 mmHg. 3. The mitral valve is grossly normal. Trivial mitral valve regurgitation. No evidence of mitral stenosis. 4. The aortic valve is tricuspid. There is mild calcification of the aortic valve. Aortic valve regurgitation is mild. No aortic stenosis is present. 5. There is mild dilatation of the ascending aorta, measuring 37 mm. 6. The inferior vena cava is normal in size with greater than 50% respiratory variability, suggesting right atrial pressure of 3 mmHg.  Comparison(s): Echocardiogram done 01/08/21 showed an EF of 60-65%.  FINDINGS Left Ventricle: Left ventricular ejection fraction, by estimation, is 60 to 65%. The left ventricle has normal function. The left ventricle has no regional wall motion abnormalities. The average left ventricular global longitudinal strain is -18.7 %. The left ventricular internal cavity size was normal in size. There is no left ventricular hypertrophy. Left ventricular diastolic parameters were normal.  Right Ventricle: The right ventricular size is normal. Right vetricular wall thickness was not well visualized. Right ventricular systolic function is normal. There is normal pulmonary artery systolic pressure. The tricuspid regurgitant velocity is 2.23 m/s, and with an assumed right atrial pressure of 3 mmHg, the estimated right ventricular systolic pressure is 22.9 mmHg.  Left Atrium: Left atrial size was normal in size.  Right Atrium: Right atrial size was normal in size.  Pericardium: There is no evidence of pericardial effusion.  Mitral Valve: The mitral valve is grossly normal. Trivial mitral valve regurgitation. No evidence of mitral valve stenosis.  Tricuspid Valve: The tricuspid valve is grossly normal. Tricuspid valve regurgitation is mild . No evidence of tricuspid stenosis.  Aortic Valve: The aortic valve is  tricuspid. There is mild calcification of the aortic valve. Aortic valve regurgitation is mild. No aortic stenosis is present. Aortic valve mean gradient measures 6.5 mmHg. Aortic valve peak gradient measures 10.9 mmHg. Aortic valve area, by VTI measures 1.23 cm.  Pulmonic Valve: The pulmonic valve was not well visualized. Pulmonic valve regurgitation is not visualized. No evidence of pulmonic stenosis.  Aorta: The aortic root is normal in size and structure. There is mild dilatation of the ascending aorta, measuring 37 mm.  Venous: The inferior vena cava is normal in size with greater than 50% respiratory variability, suggesting right atrial pressure of 3 mmHg.  IAS/Shunts: The interatrial septum was not well visualized.  LEFT VENTRICLE PLAX 2D LVIDd:         3.70 cm     Diastology LVIDs:         2.00 cm     LV e' medial:    7.07 cm/s LV PW:         1.00 cm     LV E/e' medial:  10.5 LV IVS:        0.90 cm     LV e' lateral:   8.16 cm/s LVOT diam:     1.70 cm     LV E/e' lateral: 9.1 LV SV:         48 LV SV Index:   33          2D Longitudinal Strain LVOT Area:     2.27 cm    2D Strain GLS (A4C):   -18.3 % 2D Strain GLS (A3C):   -18.1 % 2D Strain GLS (A2C):   -19.8 % LV Volumes (MOD)           2D Strain GLS Avg:     -18.7 % LV vol d, MOD A2C: 60.2 ml LV vol d, MOD A4C: 39.9 ml LV vol s, MOD A2C: 14.4 ml LV vol s, MOD A4C: 12.9 ml LV SV MOD A2C:     45.8 ml LV SV MOD A4C:     39.9 ml LV SV MOD BP:      34.8 ml  RIGHT VENTRICLE RV S prime:     10.60 cm/s  PULMONARY VEINS TAPSE (M-mode): 1.9 cm      A Reversal Velocity: 25.70 cm/s Diastolic Velocity:  47.20 cm/s S/D Velocity:        1.30 Systolic Velocity:   59.20 cm/s  LEFT ATRIUM             Index        RIGHT ATRIUM           Index LA diam:        3.50 cm 2.35 cm/m   RA Area:     10.10 cm LA Vol (A2C):   33.2 ml 22.33 ml/m  RA Volume:   19.30 ml  12.98 ml/m LA Vol (A4C):   28.7 ml 19.31 ml/m LA Biplane Vol: 31.0 ml  20.85 ml/m AORTIC VALVE AV Area (Vmax):    1.31 cm AV Area (Vmean):   1.20 cm AV Area (VTI):     1.23 cm AV Vmax:           165.00 cm/s AV Vmean:          120.000 cm/s AV VTI:            0.394 m AV Peak Grad:      10.9 mmHg AV Mean Grad:      6.5 mmHg LVOT Vmax:         95.00 cm/s LVOT Vmean:        63.200 cm/s LVOT VTI:          0.213 m LVOT/AV VTI ratio: 0.54 AR Vena Contracta: 0.30 cm  AORTA Ao Root diam: 3.20 cm Ao Asc diam:  3.70 cm  MITRAL VALVE               TRICUSPID VALVE MV Area (PHT): 2.96 cm    TR Peak grad:   19.9 mmHg MV Decel Time: 256 msec    TR Vmax:        223.00 cm/s MR Peak grad: 85.4 mmHg MR Vmax:  462.00 cm/s  SHUNTS MV E velocity: 74.40 cm/s  Systemic VTI:  0.21 m MV A velocity: 67.10 cm/s  Systemic Diam: 1.70 cm MV E/A ratio:  1.11  Sreedhar reddy Madireddy Electronically signed by Alean reddy Madireddy Signature Date/Time: 01/26/2024/2:50:34 PM    Final      Risk Assessment/Calculations             ASCVD risk score: The 10-year ASCVD risk score (Arnett DK, et al., 2019) is: 27%   Values used to calculate the score:     Age: 45 years     Clincally relevant sex: Female     Is Non-Hispanic African American: No     Diabetic: No     Tobacco smoker: No     Systolic Blood Pressure: 135 mmHg     Is BP treated: No     HDL Cholesterol: 70.6 mg/dL     Total Cholesterol: 251 mg/dL   ASSESSMENT  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  BP 135/75.  Non-smoker. 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 Dyspnea on exertion possibly anginal equivalent.  Occurs with minimal exertion now.  Echocardiogram without wall motion abnormalities.  Has had longstanding hyperlipidemia.  Has not had an ischemic evaluation Hyperlipidemia not on lipid-lowering therapy per patient preference.  We discussed any questions and concerns the patient had regarding starting statin therapy and she is in  agreement at this time.   Plan  Repeat echocardiogram in 1 year to follow-up on the aorta Will check a Lexiscan PET/CT for ischemic evaluation Start rosuvastatin 5 mg with lipid panel in 3 months Start aspirin  81 mg Goal BP consistently less than 130/80.  Reports that her BP is typically well-controlled but was a little elevated today as she was rushing into the office.  Will hold off on starting antihypertensives at this time.  If BP is consistently greater than 130/80 then would recommend either starting on an ARB or beta-blocker if heart rate tolerates  Follow up: 6 weeks     Informed Consent   Shared Decision Making/Informed Consent The risks [chest pain, shortness of breath, cardiac arrhythmias, dizziness, blood pressure fluctuations, myocardial infarction, stroke/transient ischemic attack, nausea, vomiting, allergic reaction, radiation exposure, metallic taste sensation and life-threatening complications (estimated to be 1 in 10,000)], benefits (risk stratification, diagnosing coronary artery disease, treatment guidance) and alternatives of a cardiac PET stress test were discussed in detail with Ms. Barker and she agrees to proceed.       Signed, Emeline FORBES Calender, DO

## 2024-03-14 NOTE — Patient Instructions (Signed)
 Medication Instructions:  START Rosuvastatin (Crestor) 5 mg by mouth daily.  START Aspirin  81 mg by mouth daily.  *If you need a refill on your cardiac medications before your next appointment, please call your pharmacy*  Lab Work: Lipid panel in 3 months.  If you have labs (blood work) drawn today and your tests are completely normal, you will receive your results only by: MyChart Message (if you have MyChart) OR A paper copy in the mail If you have any lab test that is abnormal or we need to change your treatment, we will call you to review the results.  Testing/Procedures: Your physician has requested that you have an echocardiogram in 1 year. Echocardiography is a painless test that uses sound waves to create images of your heart. It provides your doctor with information about the size and shape of your heart and how well your heart's chambers and valves are working. This procedure takes approximately one hour. There are no restrictions for this procedure. Please do NOT wear cologne, perfume, aftershave, or lotions (deodorant is allowed). Please arrive 15 minutes prior to your appointment time.  Please note: We ask at that you not bring children with you during ultrasound (echo/ vascular) testing. Due to room size and safety concerns, children are not allowed in the ultrasound rooms during exams. Our front office staff cannot provide observation of children in our lobby area while testing is being conducted. An adult accompanying a patient to their appointment will only be allowed in the ultrasound room at the discretion of the ultrasound technician under special circumstances. We apologize for any inconvenience.   Dr. Kriste would like you to have a cardiac PET stress test:  How to Prepare for Your Cardiac PET/CT Stress Test:  1. Please do not take these medications before your test:  ~Medications that may interfere with the cardiac pharmacological stress agent (ex. nitrates -  including erectile dysfunction medications, isosorbide mononitrate, tamulosin or beta-blockers) the day of the exam. (Erectile dysfunction medication should be held for at least 72 hrs prior to test) ~Theophylline containing medications for 12 hours. ~Dipyridamole 48 hours prior to the test. ~Your remaining medications may be taken with water .  2. Nothing to eat or drink, except water , 3 hours prior to arrival time.   ~ NO caffeine/decaffeinated products, or chocolate 12 hours prior to arrival.  3. NO perfume, cologne or lotion on chest or abdomen area.         - FEMALES - Please avoid wearing dresses to this appointment.  4. Total time is 1 to 2 hours; you may want to bring reading material for the waiting time.  Please report to Radiology at the [facility] Main Entrance 30 minutes early for your test. [address] [place], [place] [address]  Diabetic Preparation: - Hold oral medications. - You may take NPH and Lantus insulin. - Do not take Humalog or Humulin R (Regular Insulin) the day of your test. - Check blood sugars prior to leaving the house. - If able to eat breakfast prior to 3 hour fasting, you may take all medications, including your insulin, - Do not worry if you miss your breakfast dose of insulin - start at your next meal. - Patients who wear a continuous glucose monitor MUST remove the device prior to scanning.  In preparation for your appointment, medication and supplies will be purchased.  Appointment availability is limited, so if you need to cancel or reschedule, please call the Radiology Department at [phone] (  [place]) 24 hours in advance to avoid a cancellation fee of $100.00  What to Expect After you Arrive:  Once you arrive and check in for your appointment, you will be taken to a preparation room within the Radiology Department.  A technologist or Nurse will obtain your medical history, verify that you are correctly prepped for the exam,  and explain the procedure.  Afterwards,  an IV will be started in your arm and electrodes will be placed on your skin for EKG monitoring during the stress portion of the exam. Then you will be escorted to the PET/CT scanner.  There, staff will get you positioned on the scanner and obtain a blood pressure and EKG.  During the exam, you will continue to be connected to the EKG and blood pressure machines.  A small, safe amount of a radioactive tracer will be injected in your IV to obtain a series of pictures of your heart along with an injection of a stress agent.    After your Exam:  It is recommended that you eat a meal and drink a caffeinated beverage to counter act any effects of the stress agent.  Drink plenty of fluids for the remainder of the day and urinate frequently for the first couple of hours after the exam.  Your doctor will inform you of your test results within 7-10 business days.  For more information and frequently asked questions, please visit our website : [email or web address]  For questions about your test or how to prepare for your test, please call: Cardiac Imaging Nurse Navigators Office: [phone]    Follow-Up: At [facility], you and your health needs are our priority.  As part of our continuing mission to provide you with exceptional heart care, our providers are all part of one team.  This team includes your primary Cardiologist (physician) and Advanced Practice Providers or APPs (Physician Assistants and Nurse Practitioners) who all work together to provide you with the care you need, when you need it.  Your next appointment:   6 week(s)  Provider:   Emeline FORBES Calender, DO    We recommend signing up for the patient portal called MyChart.  Sign up information is provided on this After Visit Summary.  MyChart is used to connect with patients for Virtual Visits (Telemedicine).  Patients are able to view lab/test results, encounter notes, upcoming  appointments, etc.  Non-urgent messages can be sent to your provider as well.   To learn more about what you can do with MyChart, go to [email or web address].   Other Instructions

## 2024-03-18 ENCOUNTER — Encounter (HOSPITAL_COMMUNITY): Payer: Self-pay

## 2024-03-21 ENCOUNTER — Inpatient Hospital Stay (HOSPITAL_COMMUNITY): Admission: RE | Admit: 2024-03-21 | Discharge: 2024-03-21 | Attending: Internal Medicine

## 2024-03-21 DIAGNOSIS — I2510 Atherosclerotic heart disease of native coronary artery without angina pectoris: Secondary | ICD-10-CM

## 2024-03-21 LAB — NM PET CT CARDIAC PERFUSION MULTI W/ABSOLUTE BLOODFLOW
LV dias vol: 49 mL (ref 46–106)
LV sys vol: 14 mL (ref 3.8–5.2)
MBFR: 2.31
Nuc Rest EF: 71 %
Nuc Stress EF: 75 %
Rest MBF: 1.01 ml/g/min
Rest Nuclear Isotope Dose: 12.9 mCi
ST Depression (mm): 0 mm
Stress MBF: 2.33 ml/g/min
Stress Nuclear Isotope Dose: 12.8 mCi

## 2024-03-21 MED ORDER — RUBIDIUM RB82 GENERATOR (RUBYFILL)
12.7200 | PACK | Freq: Once | INTRAVENOUS | Status: AC
  Administered 2024-03-21: 12.85 via INTRAVENOUS

## 2024-03-21 MED ORDER — REGADENOSON 0.4 MG/5ML IV SOLN
INTRAVENOUS | Status: AC
  Filled 2024-03-21: qty 5

## 2024-03-21 MED ORDER — REGADENOSON 0.4 MG/5ML IV SOLN
0.4000 mg | Freq: Once | INTRAVENOUS | Status: AC
  Administered 2024-03-21: 0.4 mg via INTRAVENOUS

## 2024-03-21 MED ORDER — RUBIDIUM RB82 GENERATOR (RUBYFILL)
12.7200 | PACK | Freq: Once | INTRAVENOUS | Status: AC
  Administered 2024-03-21: 12.82 via INTRAVENOUS

## 2024-03-21 NOTE — Progress Notes (Signed)
 Pt. Tolerated lexi scan well.

## 2024-03-22 ENCOUNTER — Ambulatory Visit: Payer: Self-pay | Admitting: Internal Medicine

## 2024-03-26 ENCOUNTER — Encounter: Payer: Self-pay | Admitting: Family Medicine

## 2024-03-27 MED ORDER — GABAPENTIN 100 MG PO CAPS: 100.0000 mg | ORAL_CAPSULE | Freq: Every day | ORAL | 1 refills | Status: AC

## 2024-05-04 ENCOUNTER — Ambulatory Visit: Admitting: Internal Medicine

## 2024-05-09 ENCOUNTER — Other Ambulatory Visit: Payer: Self-pay | Admitting: Family Medicine

## 2024-05-09 NOTE — Telephone Encounter (Signed)
 Requesting: Ativan  0.5 MG Contract: 03/09/2023 UDS: 03/09/2023 Last Visit: 12/27/2023 Next Visit: 10/27/2024 w/ Waddell Almarie PIETY Last Refill: 02/14/2024  Please Advise

## 2024-06-29 ENCOUNTER — Ambulatory Visit: Admitting: Family Medicine

## 2024-10-27 ENCOUNTER — Encounter: Admitting: Family Medicine
